# Patient Record
Sex: Female | Born: 1955 | Race: White | Hispanic: No | Smoking: Never smoker
Health system: Southern US, Community
[De-identification: ages and names within clinical notes are randomized; demographics above are authoritative.]

## PROBLEM LIST (undated history)

## (undated) ENCOUNTER — Emergency Department (HOSPITAL_COMMUNITY): Payer: BC Managed Care – PPO

## (undated) DIAGNOSIS — M4310 Spondylolisthesis, site unspecified: Secondary | ICD-10-CM

## (undated) DIAGNOSIS — R945 Abnormal results of liver function studies: Secondary | ICD-10-CM

## (undated) DIAGNOSIS — M1990 Unspecified osteoarthritis, unspecified site: Secondary | ICD-10-CM

## (undated) DIAGNOSIS — F419 Anxiety disorder, unspecified: Secondary | ICD-10-CM

## (undated) DIAGNOSIS — R739 Hyperglycemia, unspecified: Secondary | ICD-10-CM

## (undated) DIAGNOSIS — M4306 Spondylolysis, lumbar region: Secondary | ICD-10-CM

## (undated) DIAGNOSIS — R7989 Other specified abnormal findings of blood chemistry: Secondary | ICD-10-CM

## (undated) DIAGNOSIS — I10 Essential (primary) hypertension: Secondary | ICD-10-CM

## (undated) DIAGNOSIS — H269 Unspecified cataract: Secondary | ICD-10-CM

## (undated) DIAGNOSIS — F32A Depression, unspecified: Secondary | ICD-10-CM

## (undated) DIAGNOSIS — K602 Anal fissure, unspecified: Secondary | ICD-10-CM

## (undated) DIAGNOSIS — F329 Major depressive disorder, single episode, unspecified: Secondary | ICD-10-CM

## (undated) DIAGNOSIS — E785 Hyperlipidemia, unspecified: Secondary | ICD-10-CM

## (undated) DIAGNOSIS — T7840XA Allergy, unspecified, initial encounter: Secondary | ICD-10-CM

## (undated) DIAGNOSIS — R569 Unspecified convulsions: Secondary | ICD-10-CM

## (undated) DIAGNOSIS — C712 Malignant neoplasm of temporal lobe: Secondary | ICD-10-CM

## (undated) HISTORY — DX: Anal fissure, unspecified: K60.2

## (undated) HISTORY — DX: Hyperglycemia, unspecified: R73.9

## (undated) HISTORY — DX: Allergy, unspecified, initial encounter: T78.40XA

## (undated) HISTORY — DX: Spondylolysis, lumbar region: M43.06

## (undated) HISTORY — DX: Unspecified osteoarthritis, unspecified site: M19.90

## (undated) HISTORY — DX: Depression, unspecified: F32.A

## (undated) HISTORY — DX: Major depressive disorder, single episode, unspecified: F32.9

## (undated) HISTORY — PX: HEEL SPUR SURGERY: SHX665

## (undated) HISTORY — DX: Essential (primary) hypertension: I10

## (undated) HISTORY — DX: Other specified abnormal findings of blood chemistry: R79.89

## (undated) HISTORY — DX: Anxiety disorder, unspecified: F41.9

## (undated) HISTORY — DX: Unspecified cataract: H26.9

## (undated) HISTORY — DX: Hyperlipidemia, unspecified: E78.5

## (undated) HISTORY — DX: Spondylolisthesis, site unspecified: M43.10

## (undated) HISTORY — PX: OTHER SURGICAL HISTORY: SHX169

## (undated) HISTORY — DX: Abnormal results of liver function studies: R94.5

---

## 1997-05-25 ENCOUNTER — Other Ambulatory Visit: Admission: RE | Admit: 1997-05-25 | Discharge: 1997-05-25 | Payer: Self-pay | Admitting: Obstetrics and Gynecology

## 1998-08-28 ENCOUNTER — Other Ambulatory Visit: Admission: RE | Admit: 1998-08-28 | Discharge: 1998-08-28 | Payer: Self-pay | Admitting: Obstetrics and Gynecology

## 1999-08-06 ENCOUNTER — Encounter: Payer: Self-pay | Admitting: Obstetrics and Gynecology

## 1999-08-06 ENCOUNTER — Encounter: Admission: RE | Admit: 1999-08-06 | Discharge: 1999-08-06 | Payer: Self-pay | Admitting: Obstetrics and Gynecology

## 1999-12-30 ENCOUNTER — Other Ambulatory Visit: Admission: RE | Admit: 1999-12-30 | Discharge: 1999-12-30 | Payer: Self-pay | Admitting: Obstetrics and Gynecology

## 2000-09-14 ENCOUNTER — Encounter: Admission: RE | Admit: 2000-09-14 | Discharge: 2000-09-14 | Payer: Self-pay | Admitting: Obstetrics and Gynecology

## 2000-09-14 ENCOUNTER — Encounter: Payer: Self-pay | Admitting: Obstetrics and Gynecology

## 2001-02-03 ENCOUNTER — Other Ambulatory Visit: Admission: RE | Admit: 2001-02-03 | Discharge: 2001-02-03 | Payer: Self-pay | Admitting: Obstetrics and Gynecology

## 2001-07-22 ENCOUNTER — Encounter: Admission: RE | Admit: 2001-07-22 | Discharge: 2001-07-22 | Payer: Self-pay | Admitting: Obstetrics and Gynecology

## 2001-07-22 ENCOUNTER — Encounter: Payer: Self-pay | Admitting: Obstetrics and Gynecology

## 2002-03-02 ENCOUNTER — Other Ambulatory Visit: Admission: RE | Admit: 2002-03-02 | Discharge: 2002-03-02 | Payer: Self-pay | Admitting: Obstetrics and Gynecology

## 2002-05-03 ENCOUNTER — Encounter: Admission: RE | Admit: 2002-05-03 | Discharge: 2002-05-03 | Payer: Self-pay | Admitting: Urology

## 2002-05-03 ENCOUNTER — Encounter: Payer: Self-pay | Admitting: Urology

## 2002-08-17 ENCOUNTER — Encounter: Admission: RE | Admit: 2002-08-17 | Discharge: 2002-08-17 | Payer: Self-pay | Admitting: Obstetrics and Gynecology

## 2002-08-17 ENCOUNTER — Encounter: Payer: Self-pay | Admitting: Obstetrics and Gynecology

## 2002-11-13 ENCOUNTER — Encounter: Admission: RE | Admit: 2002-11-13 | Discharge: 2002-11-13 | Payer: Self-pay | Admitting: Diagnostic Radiology

## 2003-03-28 ENCOUNTER — Other Ambulatory Visit: Admission: RE | Admit: 2003-03-28 | Discharge: 2003-03-28 | Payer: Self-pay | Admitting: Obstetrics and Gynecology

## 2004-05-01 ENCOUNTER — Other Ambulatory Visit: Admission: RE | Admit: 2004-05-01 | Discharge: 2004-05-01 | Payer: Self-pay | Admitting: Obstetrics and Gynecology

## 2004-07-28 ENCOUNTER — Encounter: Admission: RE | Admit: 2004-07-28 | Discharge: 2004-07-28 | Payer: Self-pay | Admitting: Obstetrics and Gynecology

## 2004-07-30 ENCOUNTER — Encounter: Admission: RE | Admit: 2004-07-30 | Discharge: 2004-07-30 | Payer: Self-pay | Admitting: Obstetrics and Gynecology

## 2004-12-01 ENCOUNTER — Encounter: Admission: RE | Admit: 2004-12-01 | Discharge: 2004-12-01 | Payer: Self-pay | Admitting: Obstetrics and Gynecology

## 2005-01-13 ENCOUNTER — Encounter (INDEPENDENT_AMBULATORY_CARE_PROVIDER_SITE_OTHER): Payer: Self-pay | Admitting: *Deleted

## 2005-01-13 ENCOUNTER — Ambulatory Visit (HOSPITAL_COMMUNITY): Admission: RE | Admit: 2005-01-13 | Discharge: 2005-01-13 | Payer: Self-pay | Admitting: Gastroenterology

## 2005-07-03 ENCOUNTER — Encounter: Admission: RE | Admit: 2005-07-03 | Discharge: 2005-07-03 | Payer: Self-pay | Admitting: *Deleted

## 2005-08-27 ENCOUNTER — Encounter: Admission: RE | Admit: 2005-08-27 | Discharge: 2005-08-27 | Payer: Self-pay | Admitting: Obstetrics and Gynecology

## 2006-09-06 ENCOUNTER — Encounter: Admission: RE | Admit: 2006-09-06 | Discharge: 2006-09-06 | Payer: Self-pay | Admitting: Obstetrics and Gynecology

## 2007-09-07 ENCOUNTER — Encounter: Admission: RE | Admit: 2007-09-07 | Discharge: 2007-09-07 | Payer: Self-pay | Admitting: Obstetrics and Gynecology

## 2008-09-11 ENCOUNTER — Encounter: Admission: RE | Admit: 2008-09-11 | Discharge: 2008-09-11 | Payer: Self-pay | Admitting: Family Medicine

## 2008-09-12 ENCOUNTER — Encounter: Admission: RE | Admit: 2008-09-12 | Discharge: 2008-09-12 | Payer: Self-pay | Admitting: Family Medicine

## 2008-09-25 ENCOUNTER — Encounter: Admission: RE | Admit: 2008-09-25 | Discharge: 2008-09-25 | Payer: Self-pay | Admitting: Obstetrics and Gynecology

## 2009-10-15 ENCOUNTER — Encounter: Admission: RE | Admit: 2009-10-15 | Discharge: 2009-10-15 | Payer: Self-pay | Admitting: Obstetrics and Gynecology

## 2010-03-10 ENCOUNTER — Encounter: Payer: Self-pay | Admitting: Obstetrics and Gynecology

## 2010-07-04 NOTE — Op Note (Signed)
Lindsey Daniels, Lindsey Daniels                  ACCOUNT NO.:  [id number]   MEDICAL RECORD NO.:  [id number]          PATIENT TYPE:  AMB   LOCATION:  ENDO                         FACILITY:  [facility]   PHYSICIAN:  Petra Kuba, M.D.    DATE OF BIRTH:  10-11-1955   DATE OF PROCEDURE:  01/10/2005  DATE OF DISCHARGE:                                 OPERATIVE REPORT   PROCEDURE:  Colonoscopy with hot biopsy.   INDICATION:  Abnormal VC for questionable polyp at the rectosigmoid  junction.  Consent was signed after risks, benefits, methods, options  thoroughly discussed with [name] both in DRI and prior to the procedure.   MEDICINES USED:  1.  Fentanyl 75 mcg.  2.  Versed 7.5 mg.   DESCRIPTION OF PROCEDURE:  Rectal inspection was pertinent for external  hemorrhoids, small.  Digital exam was negative.  The video pediatric  adjustable colonoscope was inserted, and we did evaluate the rectum and the  rectosigmoid junction at this junction, and no obvious polyp was seen.  We  elected to advance to the cecum which did require some abdominal pressure  but no position changes.  No other abnormalities were seen on insertion.  The cecum was identified by the appendiceal orifice and the ileocecal valve.  The prep was adequate.  There was some liquid stool that required washing  and suctioning.  On slow withdrawal through the colon, no polyps were seen  as we slowly withdrew back to the rectum.  Again, the rectum and distal  sigmoid were thoroughly evaluated and, again, no polyps were seen.  Anorectal pull-through and retroflexion revealed some small hemorrhoids.  The scope was straightened and readvanced to the rectosigmoid junction, and  there seemed to be a little bump at the mucosa at this juncture, although it  did seem to flatten out with more air insufflation.  We went ahead and hot  biopsied the area.  Also, right behind a fold was another little bump, and  this was hot biopsied as well.  I don't  believe either were real polyps.  No  other abnormalities were seen as we thoroughly reevaluated the area one more  time.  Air was suctioned and scope removed.  The patient tolerated the  procedure well.  There was no obvious immediate complication.   ENDOSCOPIC DIAGNOSES:  1.  Internal/external small hemorrhoids.  2.  Two questionable polyps at the rectosigmoid junction, hot biopsied.  3.  Otherwise, within normal limits to the cecum.   PLAN:  Await pathology.  If either of these are adenomatous, repeat  colonoscopy at the appropriate time.  However, if neither are polyps, might  consider a repeat VC in 3 years just to reevaluate if the patient is willing  or follow up colonoscopy in 5.  Happy to discuss that further with the  patient or Dr. Molli Posey and see back p.r.n.  Otherwise, return care to Drs.  Sharlyne Cai for the customary health care screening and maintenance.           ______________________________  Petra Kuba, M.D.  MEM/MEDQ  D:  01/13/2005  T:  01/13/2005  Job:  [id number]   cc:   Miguel Aschoff, M.D.  Fax: [phone]   Stan Head. Cleta Alberts, M.D.  Fax: [phone]   Lorenda Ishihara. Molli Posey, M.D.  Fax: [phone]

## 2010-10-31 ENCOUNTER — Other Ambulatory Visit: Payer: Self-pay | Admitting: Obstetrics and Gynecology

## 2010-10-31 DIAGNOSIS — Z1231 Encounter for screening mammogram for malignant neoplasm of breast: Secondary | ICD-10-CM

## 2010-11-04 ENCOUNTER — Ambulatory Visit: Payer: Self-pay

## 2010-11-11 ENCOUNTER — Ambulatory Visit
Admission: RE | Admit: 2010-11-11 | Discharge: 2010-11-11 | Disposition: A | Discharge status: Home / Self Care | Payer: Self-pay | Source: Ambulatory Visit | Attending: Obstetrics and Gynecology | Admitting: Obstetrics and Gynecology

## 2010-11-11 DIAGNOSIS — Z1231 Encounter for screening mammogram for malignant neoplasm of breast: Secondary | ICD-10-CM

## 2010-12-18 ENCOUNTER — Other Ambulatory Visit: Payer: Self-pay | Admitting: Obstetrics and Gynecology

## 2011-06-20 ENCOUNTER — Ambulatory Visit (INDEPENDENT_AMBULATORY_CARE_PROVIDER_SITE_OTHER): Payer: PRIVATE HEALTH INSURANCE | Admitting: Physician Assistant

## 2011-06-20 ENCOUNTER — Encounter: Payer: Self-pay | Admitting: Physician Assistant

## 2011-06-20 VITALS — BP 124/77 | HR 79 | Temp 98.3°F | Resp 16

## 2011-06-20 DIAGNOSIS — Z111 Encounter for screening for respiratory tuberculosis: Secondary | ICD-10-CM

## 2011-06-20 NOTE — Progress Notes (Signed)
Presents for screening TB skin test.  Asymptomatic.  No history of positive test.  No history of TB.  She is an Engineer, structural.  PPD placed.  RTC 48-72 hours for reading.

## 2011-06-22 ENCOUNTER — Encounter (INDEPENDENT_AMBULATORY_CARE_PROVIDER_SITE_OTHER): Payer: PRIVATE HEALTH INSURANCE

## 2011-06-22 DIAGNOSIS — Z111 Encounter for screening for respiratory tuberculosis: Secondary | ICD-10-CM

## 2011-07-11 ENCOUNTER — Other Ambulatory Visit: Payer: Self-pay | Admitting: Physician Assistant

## 2011-07-11 NOTE — Telephone Encounter (Signed)
Need chart

## 2011-07-17 LAB — TB SKIN TEST: TB Skin Test: NEGATIVE

## 2011-07-28 ENCOUNTER — Other Ambulatory Visit: Payer: Self-pay | Admitting: Physician Assistant

## 2011-08-22 ENCOUNTER — Other Ambulatory Visit: Payer: Self-pay | Admitting: Physician Assistant

## 2011-09-03 ENCOUNTER — Other Ambulatory Visit: Payer: Self-pay | Admitting: Physician Assistant

## 2011-11-30 ENCOUNTER — Other Ambulatory Visit: Payer: Self-pay | Admitting: Obstetrics and Gynecology

## 2011-11-30 DIAGNOSIS — Z1231 Encounter for screening mammogram for malignant neoplasm of breast: Secondary | ICD-10-CM

## 2011-12-18 ENCOUNTER — Other Ambulatory Visit: Payer: Self-pay | Admitting: Physician Assistant

## 2011-12-18 NOTE — Telephone Encounter (Signed)
Patient informed she needed office visit/labs in June - 2nd notice

## 2011-12-24 ENCOUNTER — Encounter: Payer: Self-pay | Admitting: Physician Assistant

## 2011-12-24 ENCOUNTER — Other Ambulatory Visit: Payer: Self-pay | Admitting: Obstetrics and Gynecology

## 2011-12-24 ENCOUNTER — Ambulatory Visit
Admission: RE | Admit: 2011-12-24 | Discharge: 2011-12-24 | Disposition: A | Discharge status: Home / Self Care | Payer: BC Managed Care – PPO | Source: Ambulatory Visit | Attending: Obstetrics and Gynecology | Admitting: Obstetrics and Gynecology

## 2011-12-24 ENCOUNTER — Ambulatory Visit (INDEPENDENT_AMBULATORY_CARE_PROVIDER_SITE_OTHER): Payer: BC Managed Care – PPO | Admitting: Physician Assistant

## 2011-12-24 VITALS — BP 138/60 | HR 78 | Temp 98.4°F | Resp 16 | Ht 61.5 in | Wt 156.2 lb

## 2011-12-24 DIAGNOSIS — F4320 Adjustment disorder, unspecified: Secondary | ICD-10-CM

## 2011-12-24 DIAGNOSIS — E785 Hyperlipidemia, unspecified: Secondary | ICD-10-CM

## 2011-12-24 DIAGNOSIS — F430 Acute stress reaction: Secondary | ICD-10-CM

## 2011-12-24 DIAGNOSIS — Z1231 Encounter for screening mammogram for malignant neoplasm of breast: Secondary | ICD-10-CM

## 2011-12-24 MED ORDER — SERTRALINE HCL 100 MG PO TABS: 100.0000 mg | ORAL_TABLET | Freq: Every day | ORAL | Status: DC

## 2011-12-24 MED ORDER — SIMVASTATIN 20 MG PO TABS: 20.0000 mg | ORAL_TABLET | Freq: Every day | ORAL | Status: DC

## 2011-12-24 NOTE — Patient Instructions (Signed)
Keep up the great work with healthy living!

## 2011-12-24 NOTE — Progress Notes (Signed)
Subjective:    Patient ID: Lindsey Daniels, female    DOB: [birth year], 56 y.o.   MRN: [id number]  HPI This 56 y.o. female presents for evaluation of hyperlipidemia and situational stress.  She is seeing [facility] for counseling, which is going very well.  Has completely changed her eating habits, and while she hasn't lost any weight, she feels much better. Sleeping much better.  Feels in control of herself.  Getting things sorted regarding her split from her husband.  He hasn't picked up his things yet, but it's all packed and in the garage.  She owns the truck he drives, so her next step is to take possession of it.   Review of Systems  No chest pain, SOB, HA, dizziness, vision change, N/V, diarrhea, constipation, dysuria, urinary urgency or frequency, myalgias, arthralgias or rash.   Past Medical History  Diagnosis Date  . Hyperlipidemia   . Depression   . Anxiety   . Elevated LFTs   . Hyperglycemia   . Anal fissure   . Spondylolisthesis     lumbar  . Pars defect of lumbar spine     Past Surgical History  Procedure Date  . Heel spur surgery     Prior to Admission medications   Medication Sig Start Date End Date Taking? Authorizing Provider  Multiple Vitamin (MULTIVITAMIN) tablet Take 1 tablet by mouth daily.   Yes Historical Provider, MD  sertraline (ZOLOFT) 100 MG tablet TAKE ONE TABLET BY MOUTH ONE TIME DAILY 09/03/11  Yes Ryan M Dunn, PA-C  simvastatin (ZOCOR) 20 MG tablet TAKE ONE TABLET BY MOUTH NIGHTLY AT BEDTIME 12/18/11  Yes Eleanore E Debbra Riding, PA-C    Allergies  Allergen Reactions  . Amoxicillin Rash    History   Social History  . Marital Status: Married    Spouse Name: N/A    Number of Children: 0  . Years of Education: N/A   Occupational History  . 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 Smoking status: Never Smoker   . Smokeless tobacco: Never Used  . Alcohol Use: 0.0 - 2.0 oz/week    0-4 drink(s) per  week  . Drug Use: No  . Sexually Active: Not Currently -- Female partner(s)   Other Topics Concern  . Not on file   Social History Narrative   Separated from second husband.  Very contentious split.    Family History  Problem Relation Age of Onset  . Mental illness Father     depression  . COPD Father   . Alcohol abuse Brother        Objective:   Physical Exam  Blood pressure 138/60, pulse 78, temperature 98.4 F (36.9 C), temperature source Oral, resp. rate 16, height 5' 1.5" (1.562 m), weight 156 lb 3.2 oz (70.852 kg), SpO2 97.00%. Body mass index is 29.04 kg/(m^2). Well-developed, well nourished WF who is awake, alert and oriented, in NAD. HEENT: [place]/AT, sclera and conjunctiva are clear.   Neck: supple, non-tender, no lymphadenopathy, thyromegaly. Heart: RRR, no murmur Lungs: normal effort, CTA Extremities: no cyanosis, clubbing or edema. Skin: warm and dry without rash. Psychologic: good mood and appropriate affect, normal speech and behavior.     Assessment & Plan:   1. Hyperlipidemia  Lipid panel, Comprehensive metabolic panel, simvastatin (ZOCOR) 20 MG tablet  2. Adult situational stress disorder  sertraline (ZOLOFT) 100 MG tablet   She is not fasting, so will return  for labs 12/26/2011.

## 2011-12-27 ENCOUNTER — Other Ambulatory Visit (INDEPENDENT_AMBULATORY_CARE_PROVIDER_SITE_OTHER): Payer: BC Managed Care – PPO | Admitting: *Deleted

## 2011-12-27 ENCOUNTER — Ambulatory Visit: Payer: BC Managed Care – PPO

## 2011-12-27 DIAGNOSIS — E785 Hyperlipidemia, unspecified: Secondary | ICD-10-CM

## 2011-12-27 DIAGNOSIS — E782 Mixed hyperlipidemia: Secondary | ICD-10-CM

## 2011-12-27 LAB — LIPID PANEL
Cholesterol: 134 mg/dL (ref 0–200)
HDL: 59 mg/dL
LDL Cholesterol: 57 mg/dL (ref 0–99)
Total CHOL/HDL Ratio: 2.3 ratio
Triglycerides: 91 mg/dL
VLDL: 18 mg/dL (ref 0–40)

## 2011-12-27 LAB — COMPREHENSIVE METABOLIC PANEL
ALT: 17 U/L (ref 0–35)
Albumin: 4.3 g/dL (ref 3.5–5.2)
Calcium: 9.7 mg/dL (ref 8.4–10.5)
Potassium: 3.9 mEq/L (ref 3.5–5.3)
Sodium: 140 mEq/L (ref 135–145)
Total Protein: 6.8 g/dL (ref 6.0–8.3)

## 2011-12-27 NOTE — Progress Notes (Signed)
Here for blood draw

## 2012-04-10 ENCOUNTER — Ambulatory Visit (INDEPENDENT_AMBULATORY_CARE_PROVIDER_SITE_OTHER): Payer: BC Managed Care – PPO | Admitting: Physician Assistant

## 2012-04-10 VITALS — BP 143/83 | HR 92 | Temp 98.5°F | Resp 17 | Ht 62.0 in | Wt 157.0 lb

## 2012-04-10 DIAGNOSIS — Z111 Encounter for screening for respiratory tuberculosis: Secondary | ICD-10-CM

## 2012-04-10 NOTE — Progress Notes (Signed)
Pt needs a PPD for her work.

## 2012-04-10 NOTE — Progress Notes (Signed)
  Subjective:    Patient ID: Lindsey Daniels, female    DOB: 06-Aug-1955, 57 y.o.   MRN: [id number]  HPI    Review of Systems     Objective:   Physical Exam        Assessment & Plan:  Tuberculosis Risk Questionnaire  1. Were you born outside the [place] in one of the following parts of the world:    [place], [place], [place], [place] or [place]?  no  2. Have you traveled outside the [place] and lived for more than one month in one of the following parts of the world:  [place], [place], [place], [place] or [place]?  No  3. Do you have a compromised immune system such as from any of the following conditions:  HIV/AIDS, organ or bone marrow transplantation, diabetes, immunosuppressive   medicines (e.g. Prednisone, Remicaide), leukemia, lymphoma, cancer of the   head or neck, gastrectomy or jejunal bypass, end-stage renal disease (on   dialysis), or silicosis?  No    4. Have you ever done one of the following:    Used crack cocaine, injected illegal drugs, worked or resided in jail or prison,   worked or resided at a homeless shelter, or worked as a Research scientist (physical sciences) in   direct contact with patients?  No  5. Have you ever been exposed to anyone with infectious tuberculosis?  Yes   Do you currently have any of the following symptoms?  1. Unexplained cough lasting more than 3 weeks? no  Unexplained fever lasting more than 3 weeks. no  3. Night Sweats (sweating that leaves the bedclothes and sheets wet)  No   4. Shortness of Breath no   5. Chest Pain {no  6. Unintentional weight loss  no  7. Unexplained fatigue no

## 2012-07-14 ENCOUNTER — Ambulatory Visit (INDEPENDENT_AMBULATORY_CARE_PROVIDER_SITE_OTHER): Payer: BC Managed Care – PPO | Admitting: Physician Assistant

## 2012-07-14 ENCOUNTER — Encounter: Payer: Self-pay | Admitting: Physician Assistant

## 2012-07-14 VITALS — BP 130/72 | HR 80 | Temp 98.2°F | Resp 17 | Ht 61.5 in | Wt 158.4 lb

## 2012-07-14 DIAGNOSIS — E785 Hyperlipidemia, unspecified: Secondary | ICD-10-CM

## 2012-07-14 DIAGNOSIS — G2581 Restless legs syndrome: Secondary | ICD-10-CM

## 2012-07-14 LAB — COMPREHENSIVE METABOLIC PANEL
ALT: 19 U/L (ref 0–35)
Calcium: 9.7 mg/dL (ref 8.4–10.5)
Chloride: 104 mEq/L (ref 96–112)
Potassium: 4.2 mEq/L (ref 3.5–5.3)
Sodium: 139 mEq/L (ref 135–145)
Total Protein: 6.9 g/dL (ref 6.0–8.3)

## 2012-07-14 LAB — CBC WITH DIFFERENTIAL/PLATELET
Basophils Absolute: 0 10*3/uL (ref 0.0–0.1)
Eosinophils Relative: 4 % (ref 0–5)
HCT: 39 % (ref 36.0–46.0)
Lymphocytes Relative: 33 % (ref 12–46)
Lymphs Abs: 1.7 10*3/uL (ref 0.7–4.0)
MCV: 82.6 fL (ref 78.0–100.0)
Monocytes Absolute: 0.3 10*3/uL (ref 0.1–1.0)
RDW: 14.2 % (ref 11.5–15.5)
WBC: 5.2 10*3/uL (ref 4.0–10.5)

## 2012-07-14 LAB — LIPID PANEL
Cholesterol: 244 mg/dL — ABNORMAL HIGH (ref 0–200)
HDL: 63 mg/dL (ref >39)
Triglycerides: 144 mg/dL (ref <150)

## 2012-07-14 NOTE — Progress Notes (Signed)
  Subjective:    Patient ID: Lindsey Daniels, female    DOB: Feb 22, 1955, 57 y.o.   MRN: [id number]  HPI  This 57 y.o. female presents for evaluation of hyperlipidemia. She's off the simvastatin and needs to see how her levels are.  Her stress has dramatically improved, and she's doing well with continued counseling and sertraline 100 mg QD.  No adverse effects.  Notes increased leg pain, and sensation of needing to move her legs more, especially at night.  Sometimes wakes her from sleep.  Symptoms have started since she's been working more and is standing longer hours.  She's recently purchased more supportive shoes, and some shoe inserts, but hasn't worn them long enough to know if they'll help.   Review of Systems No chest pain, SOB, HA, dizziness, vision change, N/V, diarrhea, constipation, dysuria, urinary urgency or frequency, or rash.     Objective:   Physical Exam Blood pressure 130/72, pulse 80, temperature 98.2 F (36.8 C), temperature source Oral, resp. rate 17, height 5' 1.5" (1.562 m), weight 158 lb 6.4 oz (71.85 kg). Body mass index is 29.45 kg/(m^2). Well-developed, well nourished WF who is awake, alert and oriented, in NAD. HEENT: [place]/AT, sclera and conjunctiva are clear.   Neck: supple, non-tender, no lymphadenopathy, thyromegaly. Heart: RRR, no murmur Lungs: normal effort, CTA Skin: warm and dry without rash. Psychologic: good mood and appropriate affect, normal speech and behavior.      Assessment & Plan:  Hyperlipidemia - Plan: Comprehensive metabolic panel, Lipid panel  Restless legs - Plan: CBC with Differential, Vitamin B12  Patient Instructions  Keep up the great work. Drink at least 64 ounces of water daily.  Wear supportive shoes. Consider support stockings.  I will contact you with your lab results as soon as they are available.   If you have not heard from me in 2 weeks, please contact me.  The fastest way to get your results is to register for My Chart  (see the instructions on the last page of this printout).     Fernande Bras, PA-C Physician Assistant-Certified [facility]

## 2012-07-14 NOTE — Patient Instructions (Addendum)
Keep up the great work. Drink at least 64 ounces of water daily.  Wear supportive shoes. Consider support stockings.  I will contact you with your lab results as soon as they are available.   If you have not heard from me in 2 weeks, please contact me.  The fastest way to get your results is to register for My Chart (see the instructions on the last page of this printout).

## 2012-07-17 ENCOUNTER — Encounter: Payer: Self-pay | Admitting: Physician Assistant

## 2012-07-20 ENCOUNTER — Encounter: Payer: Self-pay | Admitting: Internal Medicine

## 2012-08-25 DIAGNOSIS — M722 Plantar fascial fibromatosis: Secondary | ICD-10-CM | POA: Insufficient documentation

## 2012-09-07 ENCOUNTER — Encounter: Payer: Self-pay | Admitting: Physician Assistant

## 2012-09-07 DIAGNOSIS — M79604 Pain in right leg: Secondary | ICD-10-CM | POA: Insufficient documentation

## 2012-09-07 DIAGNOSIS — M79671 Pain in right foot: Secondary | ICD-10-CM

## 2012-09-07 DIAGNOSIS — M79672 Pain in left foot: Secondary | ICD-10-CM

## 2012-09-07 DIAGNOSIS — M79605 Pain in left leg: Secondary | ICD-10-CM | POA: Insufficient documentation

## 2012-11-10 ENCOUNTER — Encounter: Payer: Self-pay | Admitting: Podiatry

## 2012-11-10 DIAGNOSIS — M722 Plantar fascial fibromatosis: Secondary | ICD-10-CM

## 2012-11-21 ENCOUNTER — Encounter: Payer: Self-pay | Admitting: Podiatry

## 2012-11-21 ENCOUNTER — Ambulatory Visit (INDEPENDENT_AMBULATORY_CARE_PROVIDER_SITE_OTHER): Payer: BC Managed Care – PPO | Admitting: Podiatry

## 2012-11-21 VITALS — BP 134/81 | HR 71 | Resp 20

## 2012-11-21 DIAGNOSIS — M722 Plantar fascial fibromatosis: Secondary | ICD-10-CM

## 2012-11-21 MED ORDER — TRIAMCINOLONE ACETONIDE 10 MG/ML IJ SUSP: 5.0000 mg | Freq: Once | INTRAMUSCULAR | Status: DC

## 2012-11-21 NOTE — Progress Notes (Signed)
Subjective:     Patient ID: Lindsey Daniels, female   DOB: 06-Feb-1956, 57 y.o.   MRN: [id number]  HPI patient states that the pain is improving. Patient is satisfied with how she is doing but states pain is still present during the day and especially in the morning.   Review of Systems  All other systems reviewed and are negative.       Objective:   Physical Exam  Cardiovascular: Intact distal pulses.    neurological found to be intact. Patient is well oriented x3.     Assessment:    patient is improving. Pain still present medial cup tenial tubercle.     Plan:     Advised patient on continued night splint usage. Continue Mobic for one more month and then taper. Continue physical therapy. Prepped the right heel and injected with 3 mg Kenalog 10 mg of Xylocaine and advised on reduced activity today.

## 2012-11-21 NOTE — Patient Instructions (Signed)
Continue mobic for 1 month and then try to reduce. 1-2 ibuprophen in morning and continue splint in the evening

## 2012-11-30 ENCOUNTER — Other Ambulatory Visit: Payer: Self-pay | Admitting: Podiatry

## 2012-12-01 ENCOUNTER — Telehealth: Payer: Self-pay | Admitting: *Deleted

## 2012-12-01 NOTE — Telephone Encounter (Signed)
Request Meloxicam refill.  Left message that pt needed an appt prior to refill as instructed on LOV 10/06/2012.

## 2012-12-12 ENCOUNTER — Other Ambulatory Visit: Payer: Self-pay | Admitting: Physician Assistant

## 2012-12-29 ENCOUNTER — Other Ambulatory Visit: Payer: Self-pay | Admitting: Obstetrics and Gynecology

## 2013-01-19 ENCOUNTER — Encounter: Payer: Self-pay | Admitting: Podiatry

## 2013-01-19 ENCOUNTER — Ambulatory Visit (INDEPENDENT_AMBULATORY_CARE_PROVIDER_SITE_OTHER): Payer: BC Managed Care – PPO | Admitting: Podiatry

## 2013-01-19 VITALS — BP 146/74 | HR 75 | Resp 16

## 2013-01-19 DIAGNOSIS — M722 Plantar fascial fibromatosis: Secondary | ICD-10-CM

## 2013-01-19 NOTE — Progress Notes (Signed)
Subjective:     Patient ID: Lindsey Daniels, female   DOB: [birth year], 57 y.o.   MRN: [id number]  HPI patient states that my foot is doing much better and I  Love the orthotics   Review of Systems     Objective:   Physical Exam Neurovascular status intact with significant diminishment of pain right plantar fascia and orthotics that are well worn at the current time    Assessment:     Plantar fasciitis right improving with diminished fat pad noted    Plan:     I've recommended new orthotics and patient will have a new. Made so that she can rotate them and at this time continue night splint and reappoint as needed

## 2013-02-16 DIAGNOSIS — Z86011 Personal history of benign neoplasm of the brain: Secondary | ICD-10-CM | POA: Insufficient documentation

## 2013-02-23 ENCOUNTER — Encounter: Payer: Self-pay | Admitting: Podiatry

## 2013-03-02 ENCOUNTER — Encounter: Payer: BC Managed Care – PPO | Admitting: Physician Assistant

## 2013-03-16 ENCOUNTER — Encounter: Payer: Self-pay | Admitting: Physician Assistant

## 2013-03-16 ENCOUNTER — Ambulatory Visit (INDEPENDENT_AMBULATORY_CARE_PROVIDER_SITE_OTHER): Payer: BC Managed Care – PPO | Admitting: Physician Assistant

## 2013-03-16 VITALS — BP 130/78 | HR 76 | Temp 97.7°F | Resp 16 | Ht 62.0 in | Wt 163.0 lb

## 2013-03-16 DIAGNOSIS — F4320 Adjustment disorder, unspecified: Secondary | ICD-10-CM

## 2013-03-16 DIAGNOSIS — H9190 Unspecified hearing loss, unspecified ear: Secondary | ICD-10-CM

## 2013-03-16 DIAGNOSIS — J309 Allergic rhinitis, unspecified: Secondary | ICD-10-CM

## 2013-03-16 DIAGNOSIS — F430 Acute stress reaction: Secondary | ICD-10-CM

## 2013-03-16 DIAGNOSIS — Z111 Encounter for screening for respiratory tuberculosis: Secondary | ICD-10-CM

## 2013-03-16 DIAGNOSIS — Z1159 Encounter for screening for other viral diseases: Secondary | ICD-10-CM

## 2013-03-16 DIAGNOSIS — E785 Hyperlipidemia, unspecified: Secondary | ICD-10-CM

## 2013-03-16 DIAGNOSIS — Z0000 Encounter for general adult medical examination without abnormal findings: Secondary | ICD-10-CM

## 2013-03-16 LAB — CBC WITH DIFFERENTIAL/PLATELET
BASOS ABS: 0 10*3/uL (ref 0.0–0.1)
BASOS PCT: 0 % (ref 0–1)
Eosinophils Absolute: 0.3 10*3/uL (ref 0.0–0.7)
Eosinophils Relative: 5 % (ref 0–5)
HEMATOCRIT: 39.5 % (ref 36.0–46.0)
Hemoglobin: 13.6 g/dL (ref 12.0–15.0)
LYMPHS PCT: 28 % (ref 12–46)
Lymphs Abs: 1.7 10*3/uL (ref 0.7–4.0)
MCH: 29.1 pg (ref 26.0–34.0)
MCHC: 34.4 g/dL (ref 30.0–36.0)
MCV: 84.6 fL (ref 78.0–100.0)
Monocytes Absolute: 0.5 10*3/uL (ref 0.1–1.0)
Monocytes Relative: 8 % (ref 3–12)
NEUTROS ABS: 3.4 10*3/uL (ref 1.7–7.7)
NEUTROS PCT: 59 % (ref 43–77)
PLATELETS: 284 10*3/uL (ref 150–400)
RBC: 4.67 MIL/uL (ref 3.87–5.11)
RDW: 13.9 % (ref 11.5–15.5)
WBC: 5.9 10*3/uL (ref 4.0–10.5)

## 2013-03-16 LAB — LIPID PANEL
CHOL/HDL RATIO: 4.1 ratio
Cholesterol: 235 mg/dL — ABNORMAL HIGH (ref 0–200)
HDL: 57 mg/dL (ref >39)
LDL Cholesterol: 140 mg/dL — ABNORMAL HIGH (ref 0–99)
Triglycerides: 190 mg/dL — ABNORMAL HIGH (ref <150)
VLDL: 38 mg/dL (ref 0–40)

## 2013-03-16 LAB — COMPREHENSIVE METABOLIC PANEL
ALK PHOS: 93 U/L (ref 39–117)
ALT: 21 U/L (ref 0–35)
AST: 25 U/L (ref 0–37)
Albumin: 4.5 g/dL (ref 3.5–5.2)
BILIRUBIN TOTAL: 0.7 mg/dL (ref 0.2–1.2)
BUN: 12 mg/dL (ref 6–23)
CHLORIDE: 103 meq/L (ref 96–112)
CO2: 30 mEq/L (ref 19–32)
CREATININE: 0.67 mg/dL (ref 0.50–1.10)
Calcium: 9.7 mg/dL (ref 8.4–10.5)
Glucose, Bld: 104 mg/dL — ABNORMAL HIGH (ref 70–99)
Potassium: 4.1 mEq/L (ref 3.5–5.3)
Sodium: 139 mEq/L (ref 135–145)
Total Protein: 7.2 g/dL (ref 6.0–8.3)

## 2013-03-16 MED ORDER — SERTRALINE HCL 100 MG PO TABS: 100.0000 mg | ORAL_TABLET | Freq: Every day | ORAL | Status: DC

## 2013-03-16 MED ORDER — FLUTICASONE PROPIONATE 50 MCG/ACT NA SUSP: 2.0000 | Freq: Every day | NASAL | Status: DC

## 2013-03-16 MED ORDER — SIMVASTATIN 20 MG PO TABS: 20.0000 mg | ORAL_TABLET | Freq: Every day | ORAL | Status: DC

## 2013-03-16 NOTE — Progress Notes (Signed)
Subjective:    Patient ID: Lindsey Daniels, female    DOB: May 08, 1955, 58 y.o.   MRN: [id number]  PCP: Hermine Feria, PA-C  Chief Complaint  Patient presents with  . Annual Exam    just recently had eye exam  . PPD Placement  . Hearing Problem      Active Ambulatory Problems    Diagnosis Date Noted  . Hyperlipidemia 12/24/2011  . Bilateral leg pain 09/07/2012  . Foot pain, bilateral 09/07/2012  . Plantar fasciitis BILATERAL 08/25/2012   Resolved Ambulatory Problems    Diagnosis Date Noted  . No Resolved Ambulatory Problems   Past Medical History  Diagnosis Date  . Depression   . Anxiety   . Elevated LFTs   . Hyperglycemia   . Anal fissure   . Spondylolisthesis   . Pars defect of lumbar spine   . Osteoarthritis   . Cataract   . Allergy   . Hypertension     Past Surgical History  Procedure Laterality Date  . Heel spur surgery    . Cataract      Allergies  Allergen Reactions  . Amoxicillin Rash    Prior to Admission medications   Medication Sig Start Date End Date Taking? Authorizing Provider  sertraline (ZOLOFT) 100 MG tablet Take 1 tablet (100 mg total) by mouth daily. PATIENT NEEDS OFFICE VISIT FOR ADDITIONAL REFILLS 12/12/12  Yes Brielyn Bosak S Phuong Hillary, PA-C  ibuprofen (ADVIL,MOTRIN) 200 MG tablet Take 200 mg by mouth every 6 (six) hours as needed.    Historical Provider, MD  meloxicam (MOBIC) 15 MG tablet TAKE ONE TABLET BY MOUTH ONE TIME DAILY  11/30/12   Kendell Bane, DPM  Multiple Vitamin (MULTIVITAMIN) tablet Take 1 tablet by mouth daily.    Historical Provider, MD  simvastatin (ZOCOR) 20 MG tablet Take 1 tablet (20 mg total) by mouth at bedtime. 12/24/11   Fara Chute, PA-C    History   Social History  . Marital Status: Married    Spouse Name: separated-no papers file    Number of Children: 0  . 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 Smoking  status: Never Smoker   . Smokeless tobacco: Never Used  . Alcohol Use: 0 - 2 oz/week    0-4 drink(s) per week     Comment: social  . Drug Use: No  . Sexual Activity: Not Currently    Partners: Male   Other Topics Concern  . None   Social History Narrative   Separated from second husband.  Very contentious split.    family history includes Alcohol abuse in her brother; COPD in her father; Mental illness in her father. reported the following about her mother: ?cardiac. She indicated that her father is alive. She indicated that only one of her two brothers is alive.   HPI  Presents for Annual Wellness Exam.  Breast and pap done at GYN.  Eye exam is current.  Review of Systems  Constitutional: Negative.   HENT: Positive for congestion, drooling, hearing loss, postnasal drip, rhinorrhea and sneezing. Negative for dental problem, ear discharge, ear pain, facial swelling, mouth sores, nosebleeds, sinus pressure, sore throat, tinnitus, trouble swallowing and voice change.   Eyes: Positive for itching. Negative for photophobia, pain, discharge, redness and visual disturbance.  Respiratory: Positive for cough (due to post nasal draiange). Negative for apnea, choking, chest tightness,  shortness of breath, wheezing and stridor.   Cardiovascular: Negative.   Gastrointestinal: Negative.   Endocrine: Negative.   Genitourinary: Negative.   Musculoskeletal: Positive for arthralgias, back pain, myalgias, neck pain and neck stiffness. Negative for gait problem and joint swelling.  Skin: Negative.   Allergic/Immunologic: Positive for environmental allergies. Negative for food allergies and immunocompromised state.  Neurological: Negative.   Hematological: Negative.   Psychiatric/Behavioral: Positive for sleep disturbance. Negative for suicidal ideas, hallucinations, behavioral problems, confusion, self-injury, dysphoric mood, decreased concentration and agitation. The patient is nervous/anxious  (continues with regular therapy visits with Arvil Chaco). The patient is not hyperactive.        Objective:   Physical Exam  Vitals reviewed. Constitutional: She is oriented to person, place, and time. Vital signs are normal. She appears well-developed and well-nourished. She is active and cooperative. No distress.  HENT:  Head: Normocephalic and atraumatic.  Right Ear: Hearing, tympanic membrane, external ear and ear canal normal. No foreign bodies.  Left Ear: Hearing, tympanic membrane, external ear and ear canal normal. No foreign bodies.  Nose: Nose normal.  Mouth/Throat: Uvula is midline, oropharynx is clear and moist and mucous membranes are normal. No oral lesions. Normal dentition. No dental abscesses or uvula swelling. No oropharyngeal exudate.  Eyes: Conjunctivae, EOM and lids are normal. Pupils are equal, round, and reactive to light. Right eye exhibits no discharge. Left eye exhibits no discharge. No scleral icterus.  Fundoscopic exam:      The right eye shows no arteriolar narrowing, no AV nicking, no exudate, no hemorrhage and no papilledema. The right eye shows red reflex.       The left eye shows no arteriolar narrowing, no AV nicking, no exudate, no hemorrhage and no papilledema. The left eye shows red reflex.  Neck: Trachea normal, normal range of motion and full passive range of motion without pain. Neck supple. No spinous process tenderness and no muscular tenderness present. No mass and no thyromegaly present.  Cardiovascular: Normal rate, regular rhythm, normal heart sounds, intact distal pulses and normal pulses.   Pulmonary/Chest: Effort normal and breath sounds normal. Right breast exhibits no inverted nipple, no mass, no nipple discharge, no skin change and no tenderness. Left breast exhibits no inverted nipple, no mass, no nipple discharge, no skin change and no tenderness. Breasts are symmetrical.  Genitourinary: No breast swelling, tenderness, discharge or  bleeding.  Musculoskeletal: She exhibits no edema and no tenderness.       Cervical back: Normal.       Thoracic back: Normal.       Lumbar back: Normal.  Lymphadenopathy:       Head (right side): No tonsillar, no preauricular, no posterior auricular and no occipital adenopathy present.       Head (left side): No tonsillar, no preauricular, no posterior auricular and no occipital adenopathy present.    She has no cervical adenopathy.       Right: No supraclavicular adenopathy present.       Left: No supraclavicular adenopathy present.  Neurological: She is alert and oriented to person, place, and time. She has normal strength and normal reflexes. No cranial nerve deficit. She exhibits normal muscle tone. Coordination and gait normal.  Skin: Skin is warm, dry and intact. No rash noted. She is not diaphoretic. No cyanosis or erythema. Nails show no clubbing.  Psychiatric: She has a normal mood and affect. Her speech is normal and behavior is normal. Judgment and thought content normal.  Assessment & Plan:  1. Routine general medical examination at a health care facility Age appropriate anticipatory guidance provided. - CBC with Differential - Comprehensive metabolic panel - TSH  2. Screening examination for pulmonary tuberculosis Employer requires TB skin testing - TB Skin Test  3. Hyperlipidemia Out of simvastatin for a while.  Restart.  Healthy lifestyle changes. - Lipid panel - simvastatin (ZOCOR) 20 MG tablet; Take 1 tablet (20 mg total) by mouth at bedtime.  Dispense: 90 tablet; Refill: 3  4. Need for hepatitis C screening test - Hepatitis C antibody  5. Hearing loss Possibly related to allergic rhinitis.  See item #6.  If no improvement, will refer to ENT for additional evaluation.  6. Allergic rhinitis - fluticasone (FLONASE) 50 MCG/ACT nasal spray; Place 2 sprays into both nostrils daily.  Dispense: 16 g; Refill: 12  7. Adult situational stress  disorder Stable.  Continue therapy and medication. - sertraline (ZOLOFT) 100 MG tablet; Take 1 tablet (100 mg total) by mouth daily.  Dispense: 90 tablet; Refill: 3   Fara Chute, PA-C Physician Assistant-Certified [facility]

## 2013-03-16 NOTE — Patient Instructions (Signed)
I will contact you with your lab results as soon as they are available.   If you have not heard from me in 2 weeks, please contact me.  The fastest way to get your results is to register for My Chart (see the instructions on the last page of this printout).  Keeping You Healthy  Get These Tests  Blood Pressure- Have your blood pressure checked by your healthcare provider at least once a year.  Normal blood pressure is 120/80.  Weight- Have your body mass index (BMI) calculated to screen for obesity.  BMI is a measure of body fat based on height and weight.  You can calculate your own BMI at www.nhlbisupport.com/bmi/  Cholesterol- Have your cholesterol checked every year.  Diabetes- Have your blood sugar checked every year if you have high blood pressure, high cholesterol, a family history of diabetes or if you are overweight.  Pap Smear- Have a pap smear every 1 to 3 years if you have been sexually active.  If you are older than 65 and recent pap smears have been normal you may not need additional pap smears.  In addition, if you have had a hysterectomy  For benign disease additional pap smears are not necessary.  Mammogram-Yearly mammograms are essential for early detection of breast cancer  Screening for Colon Cancer- Colonoscopy starting at age 50. Screening may begin sooner depending on your family history and other health conditions.  Follow up colonoscopy as directed by your Gastroenterologist.  Screening for Osteoporosis- Screening begins at age 65 with bone density scanning, sooner if you are at higher risk for developing Osteoporosis.  Get these medicines  Calcium with Vitamin D- Your body requires 1200-1500 mg of Calcium a day and 800-1000 IU of Vitamin D a day.  You can only absorb 500 mg of Calcium at a time therefore Calcium must be taken in 2 or 3 separate doses throughout the day.  Hormones- Hormone therapy has been associated with increased risk for certain cancers and  heart disease.  Talk to your healthcare provider about if you need relief from menopausal symptoms.  Aspirin- Ask your healthcare provider about taking Aspirin to prevent Heart Disease and Stroke.  Get these Immuniztions  Flu shot- Every fall  Pneumonia shot- Once after the age of 65; if you are younger ask your healthcare provider if you need a pneumonia shot.  Tetanus- Every ten years.  Zostavax- Once after the age of 60 to prevent shingles.  Take these steps  Don't smoke- Your healthcare provider can help you quit. For tips on how to quit, ask your healthcare provider or go to www.smokefree.gov or call 1-800 QUIT-NOW.  Be physically active- Exercise 5 days a week for a minimum of 30 minutes.  If you are not already physically active, start slow and gradually work up to 30 minutes of moderate physical activity.  Try walking, dancing, bike riding, swimming, etc.  Eat a healthy diet- Eat a variety of healthy foods such as fruits, vegetables, whole grains, low fat milk, low fat cheeses, yogurt, lean meats, chicken, fish, eggs, dried beans, tofu, etc.  For more information go to www.thenutritionsource.org  Dental visit- Brush and floss teeth twice daily; visit your dentist twice a year.  Eye exam- Visit your Optometrist or Ophthalmologist yearly.  Drink alcohol in moderation- Limit alcohol intake to one drink or less a day.  Never drink and drive.  Depression- Your emotional health is as important as your physical health.  If you're feeling   down or losing interest in things you normally enjoy, please talk to your healthcare provider.  Seat Belts- can save your life; always wear one  Smoke/Carbon Monoxide detectors- These detectors need to be installed on the appropriate level of your home.  Replace batteries at least once a year.  Violence- If anyone is threatening or hurting you, please tell your healthcare provider.  Living Will/ Health care power of attorney- Discuss with your  healthcare provider and family. 

## 2013-03-16 NOTE — Progress Notes (Signed)
  Tuberculosis Risk Questionnaire  1. No Were you born outside the [place] in one of the following parts of the world: [place], [place], [place], [place] or [place]?    2. No Have you traveled outside the [place] and lived for more than one month in one of the following parts of the world: [place], [place], [place], [place] or [place]?    3. No Do you have a compromised immune system such as from any of the following conditions:HIV/AIDS, organ or bone marrow transplantation, diabetes, immunosuppressive medicines (e.g. Prednisone, Remicaide), leukemia, lymphoma, cancer of the head or neck, gastrectomy or jejunal bypass, end-stage renal disease (on dialysis), or silicosis?     4. Yes [place], [facility] Have you ever or do you plan on working in: a residential care center, a health care facility, a jail or prison or homeless shelter?    5. No Have you ever: injected illegal drugs, used crack cocaine, lived in a homeless shelter  or been in jail or prison?     6. Yes "A patient and all follow up procedures were done with normal outcome" Have you ever been exposed to anyone with infectious tuberculosis?    Tuberculosis Symptom Questionnaire  Do you currently have any of the following symptoms?  1. No Unexplained cough lasting more than 3 weeks?   2. No Unexplained fever lasting more than 3 weeks.   3. No Night Sweats (sweating that leaves the bedclothes and sheets wet)     4. No Shortness of Breath   5. No Chest Pain   6. No Unintentional weight loss    7. No Unexplained fatigue (very tired for no reason)

## 2013-03-17 LAB — TSH: TSH: 2.56 u[IU]/mL (ref 0.350–4.500)

## 2013-03-17 LAB — HEPATITIS C ANTIBODY: HCV Ab: NEGATIVE

## 2013-03-18 ENCOUNTER — Ambulatory Visit (INDEPENDENT_AMBULATORY_CARE_PROVIDER_SITE_OTHER): Payer: BC Managed Care – PPO

## 2013-03-18 DIAGNOSIS — Z111 Encounter for screening for respiratory tuberculosis: Secondary | ICD-10-CM

## 2013-03-18 LAB — TB SKIN TEST
Induration: 0 mm
TB Skin Test: NEGATIVE

## 2013-06-05 ENCOUNTER — Telehealth: Payer: Self-pay

## 2013-06-05 NOTE — Telephone Encounter (Signed)
Error

## 2013-08-17 ENCOUNTER — Ambulatory Visit (INDEPENDENT_AMBULATORY_CARE_PROVIDER_SITE_OTHER): Payer: BC Managed Care – PPO | Admitting: Physician Assistant

## 2013-08-17 ENCOUNTER — Encounter: Payer: Self-pay | Admitting: Physician Assistant

## 2013-08-17 VITALS — BP 120/62 | HR 93 | Temp 98.3°F | Resp 16 | Ht 61.75 in | Wt 156.8 lb

## 2013-08-17 DIAGNOSIS — M25519 Pain in unspecified shoulder: Secondary | ICD-10-CM

## 2013-08-17 DIAGNOSIS — M722 Plantar fascial fibromatosis: Secondary | ICD-10-CM

## 2013-08-17 DIAGNOSIS — E785 Hyperlipidemia, unspecified: Secondary | ICD-10-CM

## 2013-08-17 DIAGNOSIS — M25512 Pain in left shoulder: Secondary | ICD-10-CM

## 2013-08-17 MED ORDER — MELOXICAM 15 MG PO TABS: ORAL_TABLET | ORAL | Status: DC

## 2013-08-17 NOTE — Progress Notes (Signed)
Subjective:    Patient ID: Lindsey Daniels, female    DOB: 1955-09-17, 58 y.o.   MRN: [id number]   PCP: Arian Murley, PA-C  Chief Complaint  Patient presents with  . Follow-up    Cholesterol  . Medication Refill    Meloxicam 15 mg    Medications, allergies, past medical history, surgical history, family history, social history and problem list reviewed and updated.  Patient Active Problem List   Diagnosis Date Noted  . Bilateral leg pain 09/07/2012  . Foot pain, bilateral 09/07/2012  . Plantar fasciitis BILATERAL 08/25/2012  . Hyperlipidemia 12/24/2011    Prior to Admission medications   Medication Sig Start Date End Date Taking? Authorizing Provider  fluticasone (FLONASE) 50 MCG/ACT nasal spray Place 2 sprays into both nostrils daily. 03/16/13  Yes October Peery S Schneur Crowson, PA-C  ibuprofen (ADVIL,MOTRIN) 200 MG tablet Take 200 mg by mouth every 6 (six) hours as needed.   Yes Historical Provider, MD  meloxicam (MOBIC) 15 MG tablet TAKE ONE TABLET BY MOUTH ONE TIME DAILY  11/30/12  Yes Kendell Bane, DPM  Multiple Vitamin (MULTIVITAMIN) tablet Take 1 tablet by mouth daily.   Yes Historical Provider, MD  sertraline (ZOLOFT) 100 MG tablet Take 1 tablet (100 mg total) by mouth daily. 03/16/13  Yes Minnetta Sandora S Jordynne Mccown, PA-C  simvastatin (ZOCOR) 20 MG tablet Take 1 tablet (20 mg total) by mouth at bedtime. 03/16/13  Yes Fara Chute, PA-C    HPI  Presents for follow-up of hyperlipidemia.  Has worked hard on healthy eating.  She was exercising regularly, but then "fell off the wagon" for several months. Has started exercising again, in a friend's pool.  Has aggravated the pain in her LEFT shoulder and used up the meloxicam. She requests a refill.  Review of Systems No chest pain, SOB, HA, dizziness, vision change, N/V, diarrhea, constipation, dysuria, urinary urgency or frequency or rash.     Objective:   Physical Exam  Vitals reviewed. Constitutional: She is oriented to person,  place, and time. Vital signs are normal. She appears well-developed and well-nourished. She is active and cooperative. No distress.  BP 120/62  Pulse 93  Temp(Src) 98.3 F (36.8 C) (Oral)  Resp 16  Ht 5' 1.75" (1.568 m)  Wt 156 lb 12.8 oz (71.124 kg)  BMI 28.93 kg/m2  SpO2 97%  HENT:  Head: Normocephalic and atraumatic.  Right Ear: Hearing normal.  Left Ear: Hearing normal.  Eyes: Conjunctivae are normal. No scleral icterus.  Neck: Normal range of motion. Neck supple. No thyromegaly present.  Cardiovascular: Normal rate, regular rhythm and normal heart sounds.   Pulses:      Radial pulses are 2+ on the right side, and 2+ on the left side.  Pulmonary/Chest: Effort normal and breath sounds normal.  Lymphadenopathy:       Head (right side): No tonsillar, no preauricular, no posterior auricular and no occipital adenopathy present.       Head (left side): No tonsillar, no preauricular, no posterior auricular and no occipital adenopathy present.    She has no cervical adenopathy.       Right: No supraclavicular adenopathy present.       Left: No supraclavicular adenopathy present.  Neurological: She is alert and oriented to person, place, and time. No sensory deficit.  Skin: Skin is warm, dry and intact. No rash noted. No cyanosis or erythema. Nails show no clubbing.  Psychiatric: She has a normal mood and affect.  Assessment & Plan:  1. Hyperlipidemia Await lab results.  Encouraged healthy eating and exercise. - Comprehensive metabolic panel - Lipid panel  2. Plantar fasciitis BILATERAL 3. Pain in joint, shoulder region, left - Meloxicam refilled   Fara Chute, PA-C Physician Assistant-Certified [facility]

## 2013-08-17 NOTE — Patient Instructions (Signed)
I will contact you with your lab results as soon as they are available.   If you have not heard from me in 2 weeks, please contact me.  The fastest way to get your results is to register for My Chart (see the instructions on the last page of this printout).   

## 2013-08-18 ENCOUNTER — Emergency Department (HOSPITAL_BASED_OUTPATIENT_CLINIC_OR_DEPARTMENT_OTHER): Payer: No Typology Code available for payment source

## 2013-08-18 ENCOUNTER — Encounter: Payer: Self-pay | Admitting: Physician Assistant

## 2013-08-18 ENCOUNTER — Inpatient Hospital Stay (HOSPITAL_COMMUNITY): Payer: No Typology Code available for payment source

## 2013-08-18 ENCOUNTER — Inpatient Hospital Stay (HOSPITAL_BASED_OUTPATIENT_CLINIC_OR_DEPARTMENT_OTHER)
Admission: EM | Admit: 2013-08-18 | Discharge: 2013-08-19 | DRG: 101 | Disposition: A | Discharge status: Home / Self Care | Payer: No Typology Code available for payment source | Attending: Internal Medicine | Admitting: Internal Medicine

## 2013-08-18 ENCOUNTER — Encounter (HOSPITAL_BASED_OUTPATIENT_CLINIC_OR_DEPARTMENT_OTHER): Payer: Self-pay | Admitting: Emergency Medicine

## 2013-08-18 DIAGNOSIS — Z7982 Long term (current) use of aspirin: Secondary | ICD-10-CM | POA: Diagnosis not present

## 2013-08-18 DIAGNOSIS — G9389 Other specified disorders of brain: Secondary | ICD-10-CM

## 2013-08-18 DIAGNOSIS — E785 Hyperlipidemia, unspecified: Secondary | ICD-10-CM | POA: Diagnosis present

## 2013-08-18 DIAGNOSIS — I519 Heart disease, unspecified: Secondary | ICD-10-CM

## 2013-08-18 DIAGNOSIS — S62309A Unspecified fracture of unspecified metacarpal bone, initial encounter for closed fracture: Secondary | ICD-10-CM | POA: Diagnosis present

## 2013-08-18 DIAGNOSIS — Y92410 Unspecified street and highway as the place of occurrence of the external cause: Secondary | ICD-10-CM

## 2013-08-18 DIAGNOSIS — H269 Unspecified cataract: Secondary | ICD-10-CM | POA: Diagnosis present

## 2013-08-18 DIAGNOSIS — R569 Unspecified convulsions: Secondary | ICD-10-CM

## 2013-08-18 DIAGNOSIS — M1990 Unspecified osteoarthritis, unspecified site: Secondary | ICD-10-CM | POA: Diagnosis present

## 2013-08-18 DIAGNOSIS — F329 Major depressive disorder, single episode, unspecified: Secondary | ICD-10-CM | POA: Diagnosis present

## 2013-08-18 DIAGNOSIS — M79604 Pain in right leg: Secondary | ICD-10-CM

## 2013-08-18 DIAGNOSIS — I10 Essential (primary) hypertension: Secondary | ICD-10-CM | POA: Diagnosis present

## 2013-08-18 DIAGNOSIS — G939 Disorder of brain, unspecified: Secondary | ICD-10-CM | POA: Diagnosis present

## 2013-08-18 DIAGNOSIS — M79671 Pain in right foot: Secondary | ICD-10-CM

## 2013-08-18 DIAGNOSIS — F411 Generalized anxiety disorder: Secondary | ICD-10-CM | POA: Diagnosis present

## 2013-08-18 DIAGNOSIS — Q762 Congenital spondylolisthesis: Secondary | ICD-10-CM | POA: Diagnosis not present

## 2013-08-18 DIAGNOSIS — F3289 Other specified depressive episodes: Secondary | ICD-10-CM | POA: Diagnosis present

## 2013-08-18 DIAGNOSIS — M722 Plantar fascial fibromatosis: Secondary | ICD-10-CM

## 2013-08-18 DIAGNOSIS — R55 Syncope and collapse: Secondary | ICD-10-CM | POA: Diagnosis present

## 2013-08-18 DIAGNOSIS — G40909 Epilepsy, unspecified, not intractable, without status epilepticus: Secondary | ICD-10-CM | POA: Diagnosis present

## 2013-08-18 DIAGNOSIS — M79672 Pain in left foot: Secondary | ICD-10-CM

## 2013-08-18 DIAGNOSIS — M79605 Pain in left leg: Secondary | ICD-10-CM

## 2013-08-18 DIAGNOSIS — Z880 Allergy status to penicillin: Secondary | ICD-10-CM

## 2013-08-18 LAB — CBC WITH DIFFERENTIAL/PLATELET
BAND NEUTROPHILS: 1 % (ref 0–10)
BASOS PCT: 0 % (ref 0–1)
Basophils Absolute: 0 10*3/uL (ref 0.0–0.1)
EOS PCT: 3 % (ref 0–5)
Eosinophils Absolute: 0.3 10*3/uL (ref 0.0–0.7)
HCT: 42 % (ref 36.0–46.0)
HEMOGLOBIN: 14.4 g/dL (ref 12.0–15.0)
LYMPHS PCT: 23 % (ref 12–46)
Lymphs Abs: 2.1 10*3/uL (ref 0.7–4.0)
MCH: 29.6 pg (ref 26.0–34.0)
MCHC: 34.3 g/dL (ref 30.0–36.0)
MCV: 86.2 fL (ref 78.0–100.0)
MONO ABS: 0.7 10*3/uL (ref 0.1–1.0)
MONOS PCT: 8 % (ref 3–12)
NEUTROS PCT: 65 % (ref 43–77)
Neutro Abs: 6.1 10*3/uL (ref 1.7–7.7)
Platelets: 265 10*3/uL (ref 150–400)
RBC: 4.87 MIL/uL (ref 3.87–5.11)
RDW: 13.1 % (ref 11.5–15.5)
WBC: 9.2 10*3/uL (ref 4.0–10.5)

## 2013-08-18 LAB — COMPREHENSIVE METABOLIC PANEL
ALT: 24 U/L (ref 0–35)
AST: 33 U/L (ref 0–37)
Albumin: 4.4 g/dL (ref 3.5–5.2)
Alkaline Phosphatase: 110 U/L (ref 39–117)
BILIRUBIN TOTAL: 0.9 mg/dL (ref 0.2–1.2)
BUN: 10 mg/dL (ref 6–23)
CO2: 26 meq/L (ref 19–32)
Calcium: 9.6 mg/dL (ref 8.4–10.5)
Chloride: 103 mEq/L (ref 96–112)
Creat: 0.6 mg/dL (ref 0.50–1.10)
GLUCOSE: 95 mg/dL (ref 70–99)
Potassium: 4.2 mEq/L (ref 3.5–5.3)
Sodium: 141 mEq/L (ref 135–145)
TOTAL PROTEIN: 6.8 g/dL (ref 6.0–8.3)

## 2013-08-18 LAB — LIPID PANEL
CHOLESTEROL: 182 mg/dL (ref 0–200)
HDL: 62 mg/dL (ref >39)
LDL CALC: 93 mg/dL (ref 0–99)
TRIGLYCERIDES: 134 mg/dL (ref <150)
Total CHOL/HDL Ratio: 2.9 Ratio
VLDL: 27 mg/dL (ref 0–40)

## 2013-08-18 LAB — TROPONIN I: Troponin I: <0.3 ng/mL (ref <0.30)

## 2013-08-18 LAB — MAGNESIUM: MAGNESIUM: 1.9 mg/dL (ref 1.5–2.5)

## 2013-08-18 LAB — CBG MONITORING, ED: GLUCOSE-CAPILLARY: 117 mg/dL — AB (ref 70–99)

## 2013-08-18 LAB — PHOSPHORUS: Phosphorus: 4.1 mg/dL (ref 2.3–4.6)

## 2013-08-18 MED ORDER — SIMVASTATIN 20 MG PO TABS
20.0000 mg | ORAL_TABLET | Freq: Every day | ORAL | Status: DC
  Administered 2013-08-18: 20 mg via ORAL
  Filled 2013-08-18: qty 1

## 2013-08-18 MED ORDER — LEVETIRACETAM 500 MG PO TABS
500.0000 mg | ORAL_TABLET | Freq: Two times a day (BID) | ORAL | Status: DC
  Administered 2013-08-19: 500 mg via ORAL
  Filled 2013-08-18: qty 1

## 2013-08-18 MED ORDER — SERTRALINE HCL 100 MG PO TABS
100.0000 mg | ORAL_TABLET | Freq: Every day | ORAL | Status: DC
  Administered 2013-08-18: 100 mg via ORAL
  Filled 2013-08-18: qty 1

## 2013-08-18 MED ORDER — ONDANSETRON HCL 4 MG/2ML IJ SOLN: 4.0000 mg | Freq: Four times a day (QID) | INTRAMUSCULAR | Status: DC | PRN

## 2013-08-18 MED ORDER — FLUTICASONE PROPIONATE 50 MCG/ACT NA SUSP
1.0000 | Freq: Two times a day (BID) | NASAL | Status: DC
  Filled 2013-08-18 (×2): qty 16

## 2013-08-18 MED ORDER — HYDROCODONE-ACETAMINOPHEN 5-325 MG PO TABS
1.0000 | ORAL_TABLET | Freq: Four times a day (QID) | ORAL | Status: DC | PRN
  Administered 2013-08-18 – 2013-08-19 (×2): 1 via ORAL
  Filled 2013-08-18 (×2): qty 1

## 2013-08-18 MED ORDER — ADULT MULTIVITAMIN W/MINERALS CH
1.0000 | ORAL_TABLET | Freq: Every day | ORAL | Status: DC
  Administered 2013-08-18: 1 via ORAL
  Filled 2013-08-18: qty 1

## 2013-08-18 MED ORDER — ACETAMINOPHEN 325 MG PO TABS: 650.0000 mg | ORAL_TABLET | Freq: Four times a day (QID) | ORAL | Status: DC | PRN

## 2013-08-18 MED ORDER — LEVETIRACETAM IN NACL 1000 MG/100ML IV SOLN
1000.0000 mg | Freq: Once | INTRAVENOUS | Status: AC
  Administered 2013-08-18: 1000 mg via INTRAVENOUS
  Filled 2013-08-18: qty 100

## 2013-08-18 MED ORDER — MELOXICAM 7.5 MG PO TABS: 15.0000 mg | ORAL_TABLET | Freq: Every day | ORAL | Status: DC | PRN

## 2013-08-18 MED ORDER — ASPIRIN EC 81 MG PO TBEC
81.0000 mg | DELAYED_RELEASE_TABLET | Freq: Every day | ORAL | Status: DC
  Administered 2013-08-18: 81 mg via ORAL
  Filled 2013-08-18: qty 1

## 2013-08-18 MED ORDER — GADOBENATE DIMEGLUMINE 529 MG/ML IV SOLN
15.0000 mL | Freq: Once | INTRAVENOUS | Status: AC | PRN
  Administered 2013-08-18: 15 mL via INTRAVENOUS
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
## 2013-08-18 NOTE — Progress Notes (Signed)
  Echocardiogram 2D Echocardiogram has been performed.  Lindsey Daniels 08/18/2013, 5:02 PM
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
## 2013-08-18 NOTE — ED Notes (Signed)
MD at bedside. 

## 2013-08-19 DIAGNOSIS — R569 Unspecified convulsions: Principal | ICD-10-CM

## 2013-08-19 LAB — COMPREHENSIVE METABOLIC PANEL
ALBUMIN: 3.9 g/dL (ref 3.5–5.2)
ALK PHOS: 114 U/L (ref 39–117)
ALT: 29 U/L (ref 0–35)
AST: 37 U/L (ref 0–37)
Anion gap: 15 (ref 5–15)
BILIRUBIN TOTAL: 0.8 mg/dL (ref 0.3–1.2)
BUN: 14 mg/dL (ref 6–23)
CHLORIDE: 101 meq/L (ref 96–112)
CO2: 25 mEq/L (ref 19–32)
Calcium: 9.8 mg/dL (ref 8.4–10.5)
Creatinine, Ser: 0.65 mg/dL (ref 0.50–1.10)
GFR calc Af Amer: >90 mL/min (ref >90)
GFR calc non Af Amer: >90 mL/min (ref >90)
Glucose, Bld: 110 mg/dL — ABNORMAL HIGH (ref 70–99)
Potassium: 4.2 mEq/L (ref 3.7–5.3)
Sodium: 141 mEq/L (ref 137–147)
Total Protein: 7.7 g/dL (ref 6.0–8.3)

## 2013-08-19 LAB — CBC
HEMATOCRIT: 40.1 % (ref 36.0–46.0)
Hemoglobin: 13.3 g/dL (ref 12.0–15.0)
MCH: 28.8 pg (ref 26.0–34.0)
MCHC: 33.2 g/dL (ref 30.0–36.0)
MCV: 86.8 fL (ref 78.0–100.0)
PLATELETS: 240 10*3/uL (ref 150–400)
RBC: 4.62 MIL/uL (ref 3.87–5.11)
RDW: 13.4 % (ref 11.5–15.5)
WBC: 9.2 10*3/uL (ref 4.0–10.5)

## 2013-08-19 LAB — PROTIME-INR
INR: 0.93 (ref 0.00–1.49)
Prothrombin Time: 12.5 seconds (ref 11.6–15.2)

## 2013-08-19 MED ORDER — LEVETIRACETAM 500 MG PO TABS: 500.0000 mg | ORAL_TABLET | Freq: Two times a day (BID) | ORAL | Status: DC

## 2013-08-19 MED ORDER — ACETAMINOPHEN 325 MG PO TABS: 650.0000 mg | ORAL_TABLET | Freq: Four times a day (QID) | ORAL | Status: DC | PRN
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
## 2013-08-28 ENCOUNTER — Other Ambulatory Visit: Payer: Self-pay | Admitting: Neurosurgery

## 2013-08-28 DIAGNOSIS — M4856XA Collapsed vertebra, not elsewhere classified, lumbar region, initial encounter for fracture: Secondary | ICD-10-CM | POA: Insufficient documentation

## 2013-08-28 DIAGNOSIS — D496 Neoplasm of unspecified behavior of brain: Secondary | ICD-10-CM | POA: Diagnosis present

## 2013-08-29 ENCOUNTER — Other Ambulatory Visit (HOSPITAL_COMMUNITY): Payer: Self-pay | Admitting: Neurosurgery

## 2013-08-29 DIAGNOSIS — D496 Neoplasm of unspecified behavior of brain: Secondary | ICD-10-CM

## 2013-09-18 ENCOUNTER — Encounter (HOSPITAL_COMMUNITY): Payer: Self-pay | Admitting: Pharmacy Technician

## 2013-09-20 ENCOUNTER — Encounter (HOSPITAL_COMMUNITY)
Admission: RE | Admit: 2013-09-20 | Discharge: 2013-09-20 | Disposition: A | Discharge status: Home / Self Care | Payer: BC Managed Care – PPO | Source: Ambulatory Visit | Attending: Neurosurgery | Admitting: Neurosurgery

## 2013-09-20 ENCOUNTER — Encounter (HOSPITAL_COMMUNITY): Payer: Self-pay

## 2013-09-20 ENCOUNTER — Ambulatory Visit (HOSPITAL_COMMUNITY)
Admission: RE | Admit: 2013-09-20 | Discharge: 2013-09-20 | Disposition: A | Discharge status: Home / Self Care | Payer: BC Managed Care – PPO | Source: Ambulatory Visit | Attending: Neurosurgery | Admitting: Neurosurgery

## 2013-09-20 DIAGNOSIS — Z01818 Encounter for other preprocedural examination: Secondary | ICD-10-CM | POA: Insufficient documentation

## 2013-09-20 DIAGNOSIS — Z01812 Encounter for preprocedural laboratory examination: Secondary | ICD-10-CM | POA: Insufficient documentation

## 2013-09-20 DIAGNOSIS — D496 Neoplasm of unspecified behavior of brain: Secondary | ICD-10-CM | POA: Insufficient documentation

## 2013-09-20 HISTORY — DX: Unspecified convulsions: R56.9

## 2013-09-20 LAB — BASIC METABOLIC PANEL
Anion gap: 13 (ref 5–15)
BUN: 19 mg/dL (ref 6–23)
CALCIUM: 9.4 mg/dL (ref 8.4–10.5)
CO2: 24 mEq/L (ref 19–32)
CREATININE: 0.71 mg/dL (ref 0.50–1.10)
Chloride: 105 mEq/L (ref 96–112)
GFR calc non Af Amer: >90 mL/min (ref >90)
Glucose, Bld: 84 mg/dL (ref 70–99)
Potassium: 4.1 mEq/L (ref 3.7–5.3)
Sodium: 142 mEq/L (ref 137–147)

## 2013-09-20 LAB — CBC
HCT: 38.1 % (ref 36.0–46.0)
Hemoglobin: 12.8 g/dL (ref 12.0–15.0)
MCH: 29.4 pg (ref 26.0–34.0)
MCHC: 33.6 g/dL (ref 30.0–36.0)
MCV: 87.4 fL (ref 78.0–100.0)
Platelets: 232 10*3/uL (ref 150–400)
RBC: 4.36 MIL/uL (ref 3.87–5.11)
RDW: 13 % (ref 11.5–15.5)
WBC: 5.7 10*3/uL (ref 4.0–10.5)

## 2013-09-20 LAB — SURGICAL PCR SCREEN
MRSA, PCR: POSITIVE — AB
STAPHYLOCOCCUS AUREUS: POSITIVE — AB

## 2013-09-20 LAB — PREPARE RBC (CROSSMATCH)

## 2013-09-20 LAB — ABO/RH: ABO/RH(D): B NEG

## 2013-09-20 MED ORDER — IOHEXOL 300 MG/ML  SOLN
100.0000 mL | Freq: Once | INTRAMUSCULAR | Status: AC | PRN
  Administered 2013-09-20: 100 mL via INTRAVENOUS

## 2013-09-20 NOTE — Pre-Procedure Instructions (Signed)
Lindsey Daniels  09/20/2013   Your procedure is scheduled on:  Aug 17th at 0730  Report to [facility] at [phone].  Call this number if you have problems the morning of surgery: [phone]   Remember:   Do not eat food or drink liquids after midnight.   Take these medicines the morning of surgery with A SIP OF WATER: Flonase spray if needed, Levetiracetam (Keppra)  Stop taking Aspirin, Ibuprofen, BC's, Goody's, Herbal medications, Fish Oil, Mobic 7 days before your surgery  Do not wear jewelry, make-up or nail polish.  Do not wear lotions, powders, or perfumes. You may wear deodorant.  Do not shave 48 hours prior to surgery. Men may shave face and neck.  Do not bring valuables to the hospital.  [facility] is not responsible for any belongings or valuables.               Contacts, dentures or bridgework may not be worn into surgery.  Leave suitcase in the car. After surgery it may be brought to your room.  For patients admitted to the hospital, discharge time is determined by your treatment team.               Patients discharged the day of surgery will not be allowed to drive home.    Special Instructions: [facility] - Preparing for Surgery  Before surgery, you can play an important role.  Because skin is not sterile, your skin needs to be as free of germs as possible.  You can reduce the number of germs on you skin by washing with CHG (chlorahexidine gluconate) soap before surgery.  CHG is an antiseptic cleaner which kills germs and bonds with the skin to continue killing germs even after washing.  Please DO NOT use if you have an allergy to CHG or antibacterial soaps.  If your skin becomes reddened/irritated stop using the CHG and inform your nurse when you arrive at Short Stay.  Do not shave (including legs and underarms) for at least 48 hours prior to the first CHG shower.  You may shave your face.  Please follow these instructions carefully:   1.  Shower  with CHG Soap the night before surgery and the                                morning of Surgery.  2.  If you choose to wash your hair, wash your hair first as usual with your       normal shampoo.  3.  After you shampoo, rinse your hair and body thoroughly to remove the                      Shampoo.  4.  Use CHG as you would any other liquid soap.  You can apply chg directly       to the skin and wash gently with scrungie or a clean washcloth.  5.  Apply the CHG Soap to your body ONLY FROM THE NECK DOWN.        Do not use on open wounds or open sores.  Avoid contact with your eyes,       ears, mouth and genitals (private parts).  Wash genitals (private parts)       with your normal soap.  6.  Wash thoroughly, paying special attention to the area where your surgery  will be performed.  7.  Thoroughly rinse your body with warm water from the neck down.  8.  DO NOT shower/wash with your normal soap after using and rinsing off       the CHG Soap.  9.  Pat yourself dry with a clean towel.            10.  Wear clean pajamas.            11.  Place clean sheets on your bed the night of your first shower and do not        sleep with pets.  Day of Surgery  Do not apply any lotions/deoderants the morning of surgery.  Please wear clean clothes to the hospital/surgery center.      Please read over the following fact sheets that you were given: Pain Booklet, Coughing and Deep Breathing, Blood Transfusion Information, MRSA Information and Surgical Site Infection Prevention
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
## 2013-10-01 MED ORDER — VANCOMYCIN HCL IN DEXTROSE 1-5 GM/200ML-% IV SOLN
1000.0000 mg | INTRAVENOUS | Status: AC
  Administered 2013-10-02: 1000 mg via INTRAVENOUS
  Filled 2013-10-01: qty 200

## 2013-10-02 ENCOUNTER — Inpatient Hospital Stay (HOSPITAL_COMMUNITY)
Admission: RE | Admit: 2013-10-02 | Discharge: 2013-10-04 | DRG: 027 | Disposition: A | Discharge status: Home / Self Care | Payer: BC Managed Care – PPO | Source: Ambulatory Visit | Attending: Neurosurgery | Admitting: Neurosurgery

## 2013-10-02 ENCOUNTER — Encounter (HOSPITAL_COMMUNITY): Payer: Self-pay | Admitting: *Deleted

## 2013-10-02 ENCOUNTER — Encounter (HOSPITAL_COMMUNITY)
Admission: RE | Disposition: A | Discharge status: Home / Self Care | Payer: Self-pay | Source: Ambulatory Visit | Attending: Neurosurgery

## 2013-10-02 ENCOUNTER — Inpatient Hospital Stay (HOSPITAL_COMMUNITY): Payer: BC Managed Care – PPO | Admitting: Certified Registered"

## 2013-10-02 ENCOUNTER — Encounter (HOSPITAL_COMMUNITY): Payer: BC Managed Care – PPO | Admitting: Certified Registered"

## 2013-10-02 DIAGNOSIS — Z79899 Other long term (current) drug therapy: Secondary | ICD-10-CM | POA: Diagnosis not present

## 2013-10-02 DIAGNOSIS — Q762 Congenital spondylolisthesis: Secondary | ICD-10-CM | POA: Diagnosis not present

## 2013-10-02 DIAGNOSIS — D496 Neoplasm of unspecified behavior of brain: Secondary | ICD-10-CM | POA: Diagnosis present

## 2013-10-02 DIAGNOSIS — F329 Major depressive disorder, single episode, unspecified: Secondary | ICD-10-CM | POA: Diagnosis present

## 2013-10-02 DIAGNOSIS — E785 Hyperlipidemia, unspecified: Secondary | ICD-10-CM | POA: Diagnosis present

## 2013-10-02 DIAGNOSIS — C719 Malignant neoplasm of brain, unspecified: Secondary | ICD-10-CM | POA: Diagnosis present

## 2013-10-02 DIAGNOSIS — G939 Disorder of brain, unspecified: Secondary | ICD-10-CM | POA: Diagnosis present

## 2013-10-02 DIAGNOSIS — G40909 Epilepsy, unspecified, not intractable, without status epilepticus: Secondary | ICD-10-CM | POA: Diagnosis present

## 2013-10-02 DIAGNOSIS — Z7982 Long term (current) use of aspirin: Secondary | ICD-10-CM

## 2013-10-02 DIAGNOSIS — F3289 Other specified depressive episodes: Secondary | ICD-10-CM | POA: Diagnosis present

## 2013-10-02 HISTORY — PX: CRANIOTOMY: SHX93

## 2013-10-02 SURGERY — CRANIOTOMY TUMOR EXCISION
Anesthesia: General

## 2013-10-02 MED ORDER — SODIUM CHLORIDE 0.9 % IV SOLN
INTRAVENOUS | Status: DC | PRN
  Administered 2013-10-02: 08:00:00 via INTRAVENOUS

## 2013-10-02 MED ORDER — HYDRALAZINE HCL 20 MG/ML IJ SOLN: 5.0000 mg | INTRAMUSCULAR | Status: DC | PRN

## 2013-10-02 MED ORDER — NEOSTIGMINE METHYLSULFATE 10 MG/10ML IV SOLN
INTRAVENOUS | Status: DC | PRN
  Administered 2013-10-02: 3.5 mg via INTRAVENOUS

## 2013-10-02 MED ORDER — SODIUM CHLORIDE 0.9 % IR SOLN
Status: DC
  Filled 2013-10-02: qty 2

## 2013-10-02 MED ORDER — FENTANYL CITRATE 0.05 MG/ML IJ SOLN
INTRAMUSCULAR | Status: DC | PRN
  Administered 2013-10-02 (×4): 50 ug via INTRAVENOUS
  Administered 2013-10-02: 100 ug via INTRAVENOUS
  Administered 2013-10-02: 50 ug via INTRAVENOUS

## 2013-10-02 MED ORDER — HYDROCODONE-ACETAMINOPHEN 5-325 MG PO TABS
1.0000 | ORAL_TABLET | ORAL | Status: DC | PRN
  Administered 2013-10-03 – 2013-10-04 (×2): 1 via ORAL
  Filled 2013-10-02 (×2): qty 1

## 2013-10-02 MED ORDER — LIDOCAINE-EPINEPHRINE 1 %-1:100000 IJ SOLN
INTRAMUSCULAR | Status: DC | PRN
  Administered 2013-10-02: 20 mL

## 2013-10-02 MED ORDER — SIMVASTATIN 20 MG PO TABS
20.0000 mg | ORAL_TABLET | Freq: Every day | ORAL | Status: DC
  Administered 2013-10-03: 20 mg via ORAL
  Filled 2013-10-02 (×3): qty 1

## 2013-10-02 MED ORDER — FAMOTIDINE IN NACL 20-0.9 MG/50ML-% IV SOLN
20.0000 mg | INTRAVENOUS | Status: DC
  Filled 2013-10-02: qty 50

## 2013-10-02 MED ORDER — PROPOFOL 10 MG/ML IV BOLUS
INTRAVENOUS | Status: AC
  Filled 2013-10-02: qty 20

## 2013-10-02 MED ORDER — SCOPOLAMINE 1 MG/3DAYS TD PT72
MEDICATED_PATCH | TRANSDERMAL | Status: AC
  Filled 2013-10-02: qty 1

## 2013-10-02 MED ORDER — VECURONIUM BROMIDE 10 MG IV SOLR
INTRAVENOUS | Status: DC | PRN
  Administered 2013-10-02 (×2): 1 mg via INTRAVENOUS

## 2013-10-02 MED ORDER — BISACODYL 10 MG RE SUPP: 10.0000 mg | Freq: Every day | RECTAL | Status: DC | PRN

## 2013-10-02 MED ORDER — MAGNESIUM HYDROXIDE 400 MG/5ML PO SUSP: 30.0000 mL | Freq: Every day | ORAL | Status: DC | PRN

## 2013-10-02 MED ORDER — SODIUM CHLORIDE 0.9 % IV SOLN
500.0000 mg | INTRAVENOUS | Status: DC | PRN
  Administered 2013-10-02: 500 mg via INTRAVENOUS

## 2013-10-02 MED ORDER — SERTRALINE HCL 100 MG PO TABS
100.0000 mg | ORAL_TABLET | Freq: Every day | ORAL | Status: DC
  Administered 2013-10-03: 100 mg via ORAL
  Filled 2013-10-02 (×3): qty 1

## 2013-10-02 MED ORDER — POTASSIUM CHLORIDE IN NACL 20-0.9 MEQ/L-% IV SOLN
INTRAVENOUS | Status: DC
  Administered 2013-10-02: 100 mL/h via INTRAVENOUS
  Administered 2013-10-02: 13:00:00 via INTRAVENOUS
  Filled 2013-10-02 (×4): qty 1000

## 2013-10-02 MED ORDER — THROMBIN 20000 UNITS EX KIT
PACK | CUTANEOUS | Status: DC | PRN
  Administered 2013-10-02: 20000 [IU] via TOPICAL

## 2013-10-02 MED ORDER — PROMETHAZINE HCL 25 MG/ML IJ SOLN: 6.2500 mg | INTRAMUSCULAR | Status: DC | PRN

## 2013-10-02 MED ORDER — SCOPOLAMINE 1 MG/3DAYS TD PT72
MEDICATED_PATCH | TRANSDERMAL | Status: DC | PRN
  Administered 2013-10-02: 1 via TRANSDERMAL

## 2013-10-02 MED ORDER — HEMOSTATIC AGENTS (NO CHARGE) OPTIME
TOPICAL | Status: DC | PRN
  Administered 2013-10-02: 1 via TOPICAL

## 2013-10-02 MED ORDER — FENTANYL CITRATE 0.05 MG/ML IJ SOLN
INTRAMUSCULAR | Status: AC
  Filled 2013-10-02: qty 5

## 2013-10-02 MED ORDER — LEVETIRACETAM IN NACL 500 MG/100ML IV SOLN
500.0000 mg | INTRAVENOUS | Status: DC
  Filled 2013-10-02: qty 100

## 2013-10-02 MED ORDER — MIDAZOLAM HCL 2 MG/2ML IJ SOLN
INTRAMUSCULAR | Status: AC
  Filled 2013-10-02: qty 2

## 2013-10-02 MED ORDER — DEXAMETHASONE SODIUM PHOSPHATE 4 MG/ML IJ SOLN: 4.0000 mg | Freq: Three times a day (TID) | INTRAMUSCULAR | Status: DC

## 2013-10-02 MED ORDER — MEPERIDINE HCL 25 MG/ML IJ SOLN: 6.2500 mg | INTRAMUSCULAR | Status: DC | PRN

## 2013-10-02 MED ORDER — LABETALOL HCL 5 MG/ML IV SOLN
INTRAVENOUS | Status: DC | PRN
  Administered 2013-10-02 (×4): 5 mg via INTRAVENOUS

## 2013-10-02 MED ORDER — DEXAMETHASONE SODIUM PHOSPHATE 10 MG/ML IJ SOLN
INTRAMUSCULAR | Status: AC
  Filled 2013-10-02: qty 1

## 2013-10-02 MED ORDER — LIDOCAINE HCL (CARDIAC) 20 MG/ML IV SOLN
INTRAVENOUS | Status: DC | PRN
  Administered 2013-10-02: 20 mg via INTRAVENOUS

## 2013-10-02 MED ORDER — ARTIFICIAL TEARS OP OINT
TOPICAL_OINTMENT | OPHTHALMIC | Status: DC | PRN
  Administered 2013-10-02: 1 via OPHTHALMIC

## 2013-10-02 MED ORDER — DEXAMETHASONE SODIUM PHOSPHATE 10 MG/ML IJ SOLN
6.0000 mg | Freq: Four times a day (QID) | INTRAMUSCULAR | Status: AC
  Administered 2013-10-02 – 2013-10-03 (×4): 6 mg via INTRAVENOUS
  Filled 2013-10-02: qty 0.6
  Filled 2013-10-02 (×3): qty 1

## 2013-10-02 MED ORDER — ROCURONIUM BROMIDE 100 MG/10ML IV SOLN
INTRAVENOUS | Status: DC | PRN
  Administered 2013-10-02: 50 mg via INTRAVENOUS

## 2013-10-02 MED ORDER — LABETALOL HCL 5 MG/ML IV SOLN: 5.0000 mg | INTRAVENOUS | Status: DC | PRN

## 2013-10-02 MED ORDER — FAMOTIDINE PREMIXED 20-0.9 MG/50ML-% IV SOLN
INTRAVENOUS | Status: DC | PRN
Start: 2013-10-02 — End: 2013-10-02
  Administered 2013-10-02: 20 mg via INTRAVENOUS

## 2013-10-02 MED ORDER — METHYLENE BLUE 1 % INJ SOLN
INTRAMUSCULAR | Status: DC | PRN
  Administered 2013-10-02: 10 mL

## 2013-10-02 MED ORDER — BUPIVACAINE HCL (PF) 0.5 % IJ SOLN
INTRAMUSCULAR | Status: DC | PRN
  Administered 2013-10-02: 30 mL

## 2013-10-02 MED ORDER — LABETALOL HCL 5 MG/ML IV SOLN
INTRAVENOUS | Status: AC
  Administered 2013-10-02: 20 mg
  Filled 2013-10-02: qty 4

## 2013-10-02 MED ORDER — OXYCODONE HCL 5 MG PO TABS: 5.0000 mg | ORAL_TABLET | Freq: Once | ORAL | Status: DC | PRN

## 2013-10-02 MED ORDER — LACTATED RINGERS IV SOLN
INTRAVENOUS | Status: DC | PRN
  Administered 2013-10-02 (×2): via INTRAVENOUS

## 2013-10-02 MED ORDER — ACETAMINOPHEN 10 MG/ML IV SOLN
INTRAVENOUS | Status: AC
  Administered 2013-10-02: 1000 mg via INTRAVENOUS
  Filled 2013-10-02: qty 100

## 2013-10-02 MED ORDER — DEXTROSE 5 % IV SOLN: 20.0000 mg | INTRAVENOUS | Status: DC | PRN

## 2013-10-02 MED ORDER — HYDROMORPHONE HCL PF 1 MG/ML IJ SOLN: 0.2500 mg | INTRAMUSCULAR | Status: DC | PRN

## 2013-10-02 MED ORDER — BACITRACIN 50000 UNITS IM SOLR
INTRAMUSCULAR | Status: DC | PRN
  Administered 2013-10-02: 09:00:00

## 2013-10-02 MED ORDER — SCOPOLAMINE 1 MG/3DAYS TD PT72
1.0000 | MEDICATED_PATCH | Freq: Once | TRANSDERMAL | Status: DC
  Administered 2013-10-02: 1.5 mg via TRANSDERMAL

## 2013-10-02 MED ORDER — SODIUM CHLORIDE 0.9 % IR SOLN
Status: DC | PRN
  Administered 2013-10-02: 3000 mL

## 2013-10-02 MED ORDER — ONDANSETRON HCL 4 MG/2ML IJ SOLN: 4.0000 mg | INTRAMUSCULAR | Status: DC | PRN

## 2013-10-02 MED ORDER — DEXAMETHASONE SODIUM PHOSPHATE 4 MG/ML IJ SOLN
4.0000 mg | Freq: Four times a day (QID) | INTRAMUSCULAR | Status: DC
Start: 2013-10-03 — End: 2013-10-03
  Administered 2013-10-03 (×2): 4 mg via INTRAVENOUS
  Filled 2013-10-02 (×4): qty 1

## 2013-10-02 MED ORDER — GENTAMICIN IN SALINE 1.6-0.9 MG/ML-% IV SOLN
80.0000 mg | INTRAVENOUS | Status: AC
  Administered 2013-10-02: 80 mg via INTRAVENOUS
  Filled 2013-10-02: qty 50

## 2013-10-02 MED ORDER — ONDANSETRON HCL 4 MG PO TABS: 4.0000 mg | ORAL_TABLET | ORAL | Status: DC | PRN

## 2013-10-02 MED ORDER — DEXAMETHASONE SODIUM PHOSPHATE 10 MG/ML IJ SOLN
INTRAMUSCULAR | Status: DC | PRN
  Administered 2013-10-02: 10 mg via INTRAVENOUS

## 2013-10-02 MED ORDER — GLYCOPYRROLATE 0.2 MG/ML IJ SOLN
INTRAMUSCULAR | Status: DC | PRN
  Administered 2013-10-02: .6 mg via INTRAVENOUS

## 2013-10-02 MED ORDER — PROPOFOL 10 MG/ML IV BOLUS
INTRAVENOUS | Status: DC | PRN
  Administered 2013-10-02: 100 mg via INTRAVENOUS
  Administered 2013-10-02 (×2): 30 mg via INTRAVENOUS

## 2013-10-02 MED ORDER — LEVETIRACETAM IN NACL 500 MG/100ML IV SOLN
500.0000 mg | Freq: Two times a day (BID) | INTRAVENOUS | Status: DC
  Administered 2013-10-02 – 2013-10-03 (×2): 500 mg via INTRAVENOUS
  Filled 2013-10-02 (×4): qty 100

## 2013-10-02 MED ORDER — PANTOPRAZOLE SODIUM 40 MG IV SOLR
40.0000 mg | Freq: Every day | INTRAVENOUS | Status: DC
  Administered 2013-10-02: 40 mg via INTRAVENOUS
  Filled 2013-10-02 (×2): qty 40

## 2013-10-02 MED ORDER — OXYCODONE HCL 5 MG/5ML PO SOLN: 5.0000 mg | Freq: Once | ORAL | Status: DC | PRN

## 2013-10-02 MED ORDER — MORPHINE SULFATE 2 MG/ML IJ SOLN
1.0000 mg | INTRAMUSCULAR | Status: DC | PRN
  Administered 2013-10-02 – 2013-10-03 (×4): 2 mg via INTRAVENOUS
  Filled 2013-10-02 (×4): qty 1

## 2013-10-02 MED FILL — Famotidine in NaCl 0.9% IV Soln 20 MG/50ML: INTRAVENOUS | Qty: 50 | Status: AC

## 2013-10-02 SURGICAL SUPPLY — 87 items
ACCESS SYS VIEWSITE 17X11X5CM (NEUROSURGERY SUPPLIES) ×1 IMPLANT
APPLICATOR COTTON TIP 6IN STRL (MISCELLANEOUS) ×2 IMPLANT
BAG DECANTER FOR FLEXI CONT (MISCELLANEOUS) ×2 IMPLANT
BALL CTTN LRG ABS STRL LF (GAUZE/BANDAGES/DRESSINGS)
BANDAGE GAUZE 4  KLING STR (GAUZE/BANDAGES/DRESSINGS) ×4 IMPLANT
BIT DRILL WIRE PASS 1.3MM (BIT) IMPLANT
BLADE SURG ROTATE 9660 (MISCELLANEOUS) ×4 IMPLANT
BLADE ULTRA TIP 2M (BLADE) ×1 IMPLANT
BRUSH SCRUB EZ PLAIN DRY (MISCELLANEOUS) ×2 IMPLANT
BUR ACORN 6.0 PRECISION (BURR) ×2 IMPLANT
BUR MATCHSTICK NEURO 3.0 LAGG (BURR) ×1 IMPLANT
BUR ROUTER D-58 CRANI (BURR) ×1 IMPLANT
CANISTER SUCT 3000ML (MISCELLANEOUS) ×2 IMPLANT
CLIP TI MEDIUM 6 (CLIP) IMPLANT
CONT SPEC 4OZ CLIKSEAL STRL BL (MISCELLANEOUS) ×7 IMPLANT
CORDS BIPOLAR (ELECTRODE) ×2 IMPLANT
COTTONBALL LRG STERILE PKG (GAUZE/BANDAGES/DRESSINGS) IMPLANT
DRAIN SNY WOU 7FLT (WOUND CARE) IMPLANT
DRAIN SUBARACHNOID (WOUND CARE) IMPLANT
DRAPE MICROSCOPE LEICA (MISCELLANEOUS) ×1 IMPLANT
DRAPE NEUROLOGICAL W/INCISE (DRAPES) ×2 IMPLANT
DRAPE STERI IOBAN 125X83 (DRAPES) IMPLANT
DRAPE SURG 17X23 STRL (DRAPES) IMPLANT
DRAPE WARM FLUID 44X44 (DRAPE) ×2 IMPLANT
DRILL WIRE PASS 1.3MM (BIT)
DRSG ADAPTIC 3X8 NADH LF (GAUZE/BANDAGES/DRESSINGS) ×2 IMPLANT
ELECT CAUTERY BLADE 6.4 (BLADE) ×2 IMPLANT
ELECT REM PT RETURN 9FT ADLT (ELECTROSURGICAL) ×2
ELECTRODE REM PT RTRN 9FT ADLT (ELECTROSURGICAL) ×1 IMPLANT
EVACUATOR 1/8 PVC DRAIN (DRAIN) IMPLANT
EVACUATOR SILICONE 100CC (DRAIN) IMPLANT
GAUZE SPONGE 4X4 12PLY STRL (GAUZE/BANDAGES/DRESSINGS) ×2 IMPLANT
GLOVE BIO SURGEON STRL SZ8.5 (GLOVE) ×2 IMPLANT
GLOVE BIOGEL PI IND STRL 7.0 (GLOVE) IMPLANT
GLOVE BIOGEL PI IND STRL 8 (GLOVE) ×1 IMPLANT
GLOVE BIOGEL PI INDICATOR 7.0 (GLOVE) ×1
GLOVE BIOGEL PI INDICATOR 8 (GLOVE) ×1
GLOVE ECLIPSE 6.5 STRL STRAW (GLOVE) ×1 IMPLANT
GLOVE ECLIPSE 7.5 STRL STRAW (GLOVE) ×2 IMPLANT
GLOVE EXAM NITRILE LRG STRL (GLOVE) IMPLANT
GLOVE EXAM NITRILE MD LF STRL (GLOVE) ×2 IMPLANT
GLOVE EXAM NITRILE XL STR (GLOVE) IMPLANT
GLOVE EXAM NITRILE XS STR PU (GLOVE) IMPLANT
GLOVE SS BIOGEL STRL SZ 8 (GLOVE) IMPLANT
GLOVE SUPERSENSE BIOGEL SZ 8 (GLOVE) ×1
GLOVE SURG SS PI 7.0 STRL IVOR (GLOVE) ×3 IMPLANT
GOWN STRL REUS W/ TWL LRG LVL3 (GOWN DISPOSABLE) IMPLANT
GOWN STRL REUS W/ TWL XL LVL3 (GOWN DISPOSABLE) IMPLANT
GOWN STRL REUS W/TWL 2XL LVL3 (GOWN DISPOSABLE) IMPLANT
GOWN STRL REUS W/TWL LRG LVL3 (GOWN DISPOSABLE) ×2
GOWN STRL REUS W/TWL XL LVL3 (GOWN DISPOSABLE) ×6
HEMOSTAT SURGICEL 2X14 (HEMOSTASIS) ×3 IMPLANT
HOOK DURA (MISCELLANEOUS) ×2 IMPLANT
KIT BASIN OR (CUSTOM PROCEDURE TRAY) ×2 IMPLANT
KIT ROOM TURNOVER OR (KITS) ×2 IMPLANT
MARKER SPHERE PSV REFLC NDI (MISCELLANEOUS) ×2 IMPLANT
NDL SPNL 22GX3.5 QUINCKE BK (NEEDLE) ×1 IMPLANT
NEEDLE SPNL 22GX3.5 QUINCKE BK (NEEDLE) ×2 IMPLANT
NS IRRIG 1000ML POUR BTL (IV SOLUTION) ×5 IMPLANT
PACK CRANIOTOMY (CUSTOM PROCEDURE TRAY) ×2 IMPLANT
PAD ARMBOARD 7.5X6 YLW CONV (MISCELLANEOUS) ×2 IMPLANT
PAD EYE OVAL STERILE LF (GAUZE/BANDAGES/DRESSINGS) IMPLANT
PATTIES SURGICAL .25X.25 (GAUZE/BANDAGES/DRESSINGS) IMPLANT
PATTIES SURGICAL .5 X1 (DISPOSABLE) ×1 IMPLANT
PATTIES SURGICAL 1/4 X 3 (GAUZE/BANDAGES/DRESSINGS) ×1 IMPLANT
PATTIES SURGICAL 3 X3 (GAUZE/BANDAGES/DRESSINGS)
PATTIES SURGICAL 3X3 (GAUZE/BANDAGES/DRESSINGS) IMPLANT
PIN MAYFIELD SKULL DISP (PIN) ×1 IMPLANT
PLATE 1.5  2HOLE MED NEURO (Plate) ×1 IMPLANT
PLATE 1.5 2HOLE MED NEURO (Plate) IMPLANT
PLATE 1.5 5HOLE SQUARE (Plate) ×2 IMPLANT
RUBBERBAND STERILE (MISCELLANEOUS) IMPLANT
SCREW SELF DRILL HT 1.5/4MM (Screw) ×10 IMPLANT
SPONGE SURGIFOAM ABS GEL 100 (HEMOSTASIS) ×2 IMPLANT
STAPLER SKIN PROX WIDE 3.9 (STAPLE) ×2 IMPLANT
SUT NURALON 4 0 TR CR/8 (SUTURE) ×6 IMPLANT
SUT VIC AB 0 CT1 18XCR BRD8 (SUTURE) ×2 IMPLANT
SUT VIC AB 0 CT1 8-18 (SUTURE) ×4
SUT VIC AB 2-0 CP2 18 (SUTURE) ×4 IMPLANT
SYR 20ML ECCENTRIC (SYRINGE) ×2 IMPLANT
SYR CONTROL 10ML LL (SYRINGE) ×2 IMPLANT
TAPE PAPER 2X10 WHT MICROPORE (GAUZE/BANDAGES/DRESSINGS) ×1 IMPLANT
TOWEL OR 17X24 6PK STRL BLUE (TOWEL DISPOSABLE) ×2 IMPLANT
TOWEL OR 17X26 10 PK STRL BLUE (TOWEL DISPOSABLE) ×2 IMPLANT
TRAY FOLEY CATH 16FRSI W/METER (SET/KITS/TRAYS/PACK) IMPLANT
UNDERPAD 30X30 INCONTINENT (UNDERPADS AND DIAPERS) ×2 IMPLANT
WATER STERILE IRR 1000ML POUR (IV SOLUTION) ×4 IMPLANT

## 2013-10-02 NOTE — Op Note (Signed)
10/02/2013  11:03 AM  PATIENT:  Lindsey Daniels  58 y.o. female  PRE-OPERATIVE DIAGNOSIS:  Brain tumor  POST-OPERATIVE DIAGNOSIS:  Brain Tumor  PROCEDURE:  Procedure(s):  Right temporal Craniotomy for resection of tumor with microdissection, microsurgical technique, and the operating microscope and with intraoperative stealth frameless stereotaxis   SURGEON:  Surgeon(s): Hosie Spangle, MD Ashok Pall, MD  ASSISTANTS: Ashok Pall, MD  ANESTHESIA:   general  EBL:  Total I/O In: 1300 [I.V.:1300] Out: 6 [Urine:140; Blood:150]  BLOOD ADMINISTERED:none  COUNT: Correct per nursing staff   SPECIMEN:  Source of Specimen:  brain tumor  DICTATION: Prior surgery the patient underwent a Stealth protocol CT of the brain as well as a standard MRI of the brain. Both studies were loaded into the Stealth station, and preoperative planning was performed. The patient was brought to the operating room, placed under general endotracheal anesthesia. The 3 pin Mayfield head holder was applied, a roll was placed beneath the right shoulder, and the patient was turned gently to the left. In the right temporal region was shaved, and the patient was registered to the Stealth unit, and the tumor localized. The scalp was then prepped with Betadine soap and solution, and draped in a sterile fashion. We then localized the tumor with the Stealth unit, and planned a horseshoe-shaped incision in the right temporal region. The line of the incision was infiltrated with local site with epinephrine. Incision was made, Raney clips were applied for scalp edge is to maintain hemostasis. The scalp was reflected caudally, with self-retaining scalp hooks.  The temporalis fascia and muscle were incised in a U-shaped fashion, and the temporalis fascia muscle reflected caudally, with self-retaining hooks.  We again localize the tumor with the Stealth unit, and a single burr hole was made, the dura dissected from the overlying  skull, and the craniotome was used to turn a bone flap. The patient did have a prominent mastoid air cell complex, was entered, and the air cells were waxed with bone wax thoroughly. The dura was tacked up around the margins the craniotomy with 4-0 Nurolon suture. The dura was then opened in a U-shaped fashion and hinged caudally. The cortical surface was examined, and with the Stealth unit, the tumor localized. We identified the gyrus overlying the tumor, and the pial surface was coagulated and incised in a horizontal fashion. Superficial dissection was done, and then the Dora Luna retractor system was set up, and we selected a 11 x 17 x 50 Vycor retractor. The operating microscope was draped, and brought in the field about instrumentation, illumination, and visualization, and the remainder the tumor resection was performed using microdissection microsurgical technique. Using the Stealth system, we gently passed the Vycor retractor down to the tumor. The tumor was identified, and we did enter the tumor cyst. Specimens of tumor were collected for both frozen and permanent section. Report of the frozen section by Dr. Joya Martyr was of lesional specimen with atypia. There is of abnormal tissue were removed, and sent to pathology for permanent examination, no gross total resection of the tumor was felt to be achieved. We then established hemostasis with the use of bipolar cautery. The was irrigated extensively with warm saline, and then the Vycor retractor was removed. The intradural exposure was further irrigated with warm saline, hemostasis was established and confirmed, and we then proceeded with closure. There was closed with interrupted 4-0 Nurolon sutures. The bone flap was secured to the skull with Lorenz cranial plates. The dura  was tacked up to the midportion of the bone flap with a single 4-0 Nurolon suture. The temporalis fascia and muscle were approximate interrupted undyed 0 Vicryl suture. The galea was  closed with interrupted inverted 2-0 Vicryl suture. Skin is approximate surgical staples. The wound was dressed with Adaptic and sterile gauze. Following surgery the patient was taken out of the 3 pin Mayfield head holder, reversed from the anesthetic, extubated, and transferred to the recovery room for further care. In the recovery room patient is awake, following commands, moving all 4 extremities well.  PLAN OF CARE: Admit to inpatient   PATIENT DISPOSITION:  PACU - hemodynamically stable.   Delay start of Pharmacological VTE agent (>24hrs) due to surgical blood loss or risk of bleeding:  yes
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
## 2013-10-02 NOTE — Progress Notes (Signed)
UR completed.  Latiya Navia, RN BSN [facility] CCM Trauma/Neuro ICU Case Manager [phone]  

## 2013-10-02 NOTE — Anesthesia Procedure Notes (Signed)
Procedure Name: Intubation Date/Time: 10/02/2013 8:01 AM Performed by: Storm Frisk E Pre-anesthesia Checklist: Patient identified, Timeout performed, Emergency Drugs available, Suction available and Patient being monitored Patient Re-evaluated:Patient Re-evaluated prior to inductionOxygen Delivery Method: Circle system utilized Preoxygenation: Pre-oxygenation with 100% oxygen Intubation Type: IV induction and Cricoid Pressure applied Ventilation: Mask ventilation without difficulty Laryngoscope Size: Mac and 3 Grade View: Grade I Tube type: Oral Tube size: 7.0 mm Number of attempts: 1 Airway Equipment and Method: Stylet Placement Confirmation: ETT inserted through vocal cords under direct vision,  breath sounds checked- equal and bilateral and positive ETCO2 Secured at: 21 cm Tube secured with: Tape Dental Injury: Teeth and Oropharynx as per pre-operative assessment

## 2013-10-02 NOTE — Progress Notes (Signed)
Subjective: Patient resting comfortably in bed. No nausea or vomiting.  Objective: Vital signs in last 24 hours: Filed Vitals:   10/02/13 1700 10/02/13 1800 10/02/13 1900 10/02/13 1938  BP: 100/53 118/61 109/60   Pulse: 62 72 70   Temp:    97.9 F (36.6 C)  TempSrc:    Oral  Resp: 11 14 16    Height:      Weight:      SpO2: 99% 100% 100%     Physical Exam:  Awake and alert, oriented. Following commands. Moving all 4 extremities well. No drift of upper extremities. Pupils equal, round, reactive to light. EOMI. Facial movements symmetrical. Dressing clean and dry. Foley to straight drainage.  Assessment/Plan: Patient doing well following surgery. If she does well through the night, we'll begin po's and begin to mobilize.   Hosie Spangle, MD 10/02/2013, 8:29 PM

## 2013-10-02 NOTE — Transfer of Care (Signed)
Immediate Anesthesia Transfer of Care Note  Patient: Lindsey Daniels  Procedure(s) Performed: Procedure(s) with comments: 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 (N/A) - 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  Patient Location: PACU  Anesthesia Type:General  Level of Consciousness: lethargic and responds to stimulation  Airway & Oxygen Therapy: Patient Spontanous Breathing and Patient connected to nasal cannula oxygen  Post-op Assessment: Report given to PACU RN and Post -op Vital signs reviewed and stable  Post vital signs: Reviewed and stable  Complications: No apparent anesthesia complications

## 2013-10-02 NOTE — Anesthesia Preprocedure Evaluation (Addendum)
Anesthesia Evaluation  Patient identified by MRN, date of birth, ID band Patient awake    Reviewed: Allergy & Precautions, H&P , NPO status , Patient's Chart, lab work & pertinent test results  History of Anesthesia Complications Negative for: history of anesthetic complications  Airway Mallampati: I TM Distance: >3 FB Neck ROM: Full    Dental  (+) Teeth Intact, Dental Advisory Given   Pulmonary neg pulmonary ROS,  breath sounds clear to auscultation  Pulmonary exam normal       Cardiovascular hypertension (off meds now), - anginaRhythm:Regular Rate:Normal     Neuro/Psych Anxiety Presented with syncope: temporal brain tumor    GI/Hepatic negative GI ROS, Neg liver ROS,   Endo/Other  negative endocrine ROS  Renal/GU negative Renal ROS     Musculoskeletal   Abdominal   Peds  Hematology negative hematology ROS (+)   Anesthesia Other Findings   Reproductive/Obstetrics negative OB ROS                          Anesthesia Physical Anesthesia Plan  ASA: III  Anesthesia Plan: General   Post-op Pain Management:    Induction: Intravenous  Airway Management Planned: Oral ETT  Additional Equipment: Arterial line  Intra-op Plan:   Post-operative Plan: Extubation in OR  Informed Consent: I have reviewed the patients History and Physical, chart, labs and discussed the procedure including the risks, benefits and alternatives for the proposed anesthesia with the patient or authorized representative who has indicated his/her understanding and acceptance.   Dental advisory given  Plan Discussed with: CRNA and Surgeon  Anesthesia Plan Comments: (Plan routine monitors, a-line, GETA)        Anesthesia Quick Evaluation

## 2013-10-02 NOTE — Anesthesia Postprocedure Evaluation (Signed)
  Anesthesia Post-op Note  Patient: Lindsey Daniels  Procedure(s) Performed: Procedure(s) with comments: 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 (N/A) - 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  Patient Location: PACU  Anesthesia Type:General  Level of Consciousness: awake, alert , oriented and patient cooperative  Airway and Oxygen Therapy: Patient Spontanous Breathing and Patient connected to nasal cannula oxygen  Post-op Pain: none  Post-op Assessment: Post-op Vital signs reviewed, Patient's Cardiovascular Status Stable, Respiratory Function Stable, Patent Airway, No signs of Nausea or vomiting and Pain level controlled  Post-op Vital Signs: Reviewed and stable  Last Vitals:  Filed Vitals:   10/02/13 1155  BP:   Pulse: 70  Temp: 36.6 C  Resp: 17    Complications: No apparent anesthesia complications

## 2013-10-03 ENCOUNTER — Inpatient Hospital Stay (HOSPITAL_COMMUNITY): Payer: BC Managed Care – PPO

## 2013-10-03 LAB — TYPE AND SCREEN
ABO/RH(D): B NEG
Antibody Screen: NEGATIVE

## 2013-10-03 LAB — CBC WITH DIFFERENTIAL/PLATELET
Basophils Absolute: 0 10*3/uL (ref 0.0–0.1)
Basophils Relative: 0 % (ref 0–1)
Eosinophils Absolute: 0 10*3/uL (ref 0.0–0.7)
Eosinophils Relative: 0 % (ref 0–5)
HCT: 33.1 % — ABNORMAL LOW (ref 36.0–46.0)
Hemoglobin: 11.3 g/dL — ABNORMAL LOW (ref 12.0–15.0)
Lymphocytes Relative: 4 % — ABNORMAL LOW (ref 12–46)
Lymphs Abs: 0.7 10*3/uL (ref 0.7–4.0)
MCH: 29.1 pg (ref 26.0–34.0)
MCHC: 34.1 g/dL (ref 30.0–36.0)
MCV: 85.3 fL (ref 78.0–100.0)
Monocytes Absolute: 0.4 10*3/uL (ref 0.1–1.0)
Monocytes Relative: 2 % — ABNORMAL LOW (ref 3–12)
Neutro Abs: 15.4 10*3/uL — ABNORMAL HIGH (ref 1.7–7.7)
Neutrophils Relative %: 94 % — ABNORMAL HIGH (ref 43–77)
Platelets: 212 10*3/uL (ref 150–400)
RBC: 3.88 MIL/uL (ref 3.87–5.11)
RDW: 13.3 % (ref 11.5–15.5)
WBC: 16.5 10*3/uL — ABNORMAL HIGH (ref 4.0–10.5)

## 2013-10-03 LAB — BASIC METABOLIC PANEL
Anion gap: 13 (ref 5–15)
BUN: 11 mg/dL (ref 6–23)
CO2: 23 mEq/L (ref 19–32)
Calcium: 8.8 mg/dL (ref 8.4–10.5)
Chloride: 106 mEq/L (ref 96–112)
Creatinine, Ser: 0.48 mg/dL — ABNORMAL LOW (ref 0.50–1.10)
GFR calc Af Amer: >90 mL/min (ref >90)
GFR calc non Af Amer: >90 mL/min (ref >90)
Glucose, Bld: 158 mg/dL — ABNORMAL HIGH (ref 70–99)
Potassium: 4.2 mEq/L (ref 3.7–5.3)
Sodium: 142 mEq/L (ref 137–147)

## 2013-10-03 MED ORDER — DEXAMETHASONE 4 MG PO TABS
4.0000 mg | ORAL_TABLET | Freq: Three times a day (TID) | ORAL | Status: DC
  Administered 2013-10-04 (×2): 4 mg via ORAL
  Filled 2013-10-03 (×5): qty 1

## 2013-10-03 MED ORDER — DEXAMETHASONE 4 MG PO TABS: 4.0000 mg | ORAL_TABLET | Freq: Three times a day (TID) | ORAL | Status: DC

## 2013-10-03 MED ORDER — PANTOPRAZOLE SODIUM 40 MG PO TBEC
40.0000 mg | DELAYED_RELEASE_TABLET | Freq: Every day | ORAL | Status: DC
  Administered 2013-10-03 – 2013-10-04 (×2): 40 mg via ORAL
  Filled 2013-10-03: qty 1

## 2013-10-03 MED ORDER — GADOBENATE DIMEGLUMINE 529 MG/ML IV SOLN
15.0000 mL | Freq: Once | INTRAVENOUS | Status: AC
  Administered 2013-10-03: 15 mL via INTRAVENOUS

## 2013-10-03 MED ORDER — LEVETIRACETAM 500 MG PO TABS
500.0000 mg | ORAL_TABLET | Freq: Two times a day (BID) | ORAL | Status: DC
  Administered 2013-10-03 – 2013-10-04 (×3): 500 mg via ORAL
  Filled 2013-10-03 (×5): qty 1

## 2013-10-03 NOTE — Progress Notes (Signed)
Bio tech in to check current Jewitt TLSO brace. He found it to be broken and he placed a new one on patient.

## 2013-10-03 NOTE — Progress Notes (Signed)
Subjective: Patient resting comfortably in bed, without complaints. For MRI later this morning.  Objective: Vital signs in last 24 hours: Filed Vitals:   10/03/13 0500 10/03/13 0600 10/03/13 0700 10/03/13 0812  BP: 94/33 92/40 84/69  98/51  Pulse: 57 58 71 64  Temp:    98.1 F (36.7 C)  TempSrc:    Oral  Resp: 11 13 28 14   Height:      Weight:      SpO2: 94% 93% 99% 100%    Intake/Output from previous day: 08/17 0701 - 08/18 0700 In: 3053.3 [I.V.:2953.3; IV Piggyback:100] Out: 1890 [PIRJJ:8841; Blood:150] Intake/Output this shift:    Physical Exam:  Awake and alert, oriented. Following commands. Moving all 4 from his well. No drift. Dressing clean and dry.  CBC  Recent Labs  10/03/13 0527  WBC 16.5*  HGB 11.3*  HCT 33.1*  PLT 212   BMET  Recent Labs  10/03/13 0527  NA 142  K 4.2  CL 106  CO2 23  GLUCOSE 158*  BUN 11  CREATININE 0.48*  CALCIUM 8.8    Assessment/Plan: We'll begin to mobilize, initially out of bed to chair, and ambulate in the ICU several times today. We'll DC Foley and a line. Will advance to regular diet. We'll change IV to saline lock.   Hosie Spangle, MD 10/03/2013, 8:26 AM

## 2013-10-04 ENCOUNTER — Encounter (HOSPITAL_COMMUNITY): Payer: Self-pay | Admitting: Neurosurgery

## 2013-10-04 MED ORDER — HYDROCODONE-ACETAMINOPHEN 5-325 MG PO TABS: 1.0000 | ORAL_TABLET | ORAL | Status: DC | PRN

## 2013-10-04 NOTE — Progress Notes (Signed)
Pt being discharge home with family friend Kalman Shan). IV's removed. Reviewed all discharge instructions and answered all questions. Prescriptions reviewed and given to patient for discharge. Patient education and care plan completed. Patient is currently in no pain VS stable and states she "feels great and ready to go home".

## 2013-10-04 NOTE — Discharge Summary (Signed)
Physician Discharge Summary  Patient ID: Lindsey Daniels MRN: [id number] DOB/AGE: 1955-04-12 58 y.o.  Admit date: 10/02/2013 Discharge date: 10/04/2013  Admission Diagnoses:  Brain tumor  Discharge Diagnoses:  Brain tumor Active Problems:   Brain tumor   Discharged Condition: good  Hospital Course: Patient was admitted, underwent a right temporal craniotomy and resection of tumor. Postoperatively she has done well. She is up and living actively. Her wound is healing nicely. Neurologically she is intact.  She is being discharged to home with instructions regarding wound care and activities. She is to return for staple removal in 6 days. She's been given a prescription for Decadron taper (4 mg every 8 hours for 4 doses, then 4 mg every 12 hours 4 doses, then 2 mg every 12 hours for 4 doses, then 2 mg once a day for 2 doses, then 2 mg every other day for 2 doses, then discontinue).  Pathology is pending, and will be discussed with the patient and her return visit. Postoperative MRI showed excellent resection of the tumor.  Discharge Exam: Blood pressure 132/70, pulse 50, temperature 97.7 F (36.5 C), temperature source Oral, resp. rate 16, height 5\' 2"  (1.575 m), weight 77.9 kg (171 lb 11.8 oz), SpO2 97.00%.  Disposition: Home     Medication List         aspirin EC 81 MG tablet  Take 81 mg by mouth daily.     fluticasone 50 MCG/ACT nasal spray  Commonly known as:  FLONASE  Place 1 spray into both nostrils 2 (two) times daily as needed for allergies.     HYDROcodone-acetaminophen 5-325 MG per tablet  Commonly known as:  NORCO/VICODIN  Take 1-2 tablets by mouth every 4 (four) hours as needed for moderate pain or severe pain.     levETIRAcetam 500 MG tablet  Commonly known as:  KEPPRA  Take 1 tablet (500 mg total) by mouth 2 (two) times daily.     meloxicam 15 MG tablet  Commonly known as:  MOBIC  Take 15 mg by mouth daily.     multivitamin with minerals Tabs tablet  Take 1  tablet by mouth at bedtime.     sertraline 100 MG tablet  Commonly known as:  ZOLOFT  Take 100 mg by mouth at bedtime.     simvastatin 20 MG tablet  Commonly known as:  ZOCOR  Take 1 tablet (20 mg total) by mouth at bedtime.     SYSTANE OP  Apply 1 drop to eye 2 (two) times daily as needed (for irritation).     valACYclovir 500 MG tablet  Commonly known as:  VALTREX  Take 500 mg by mouth 2 (two) times daily. Takes only if needed for breakouts         Signed: Hosie Spangle, MD 10/04/2013, 8:37 AM

## 2013-10-04 NOTE — Progress Notes (Signed)
Patient is becoming more bradycardic in the high 40's, earlier she was in the 50's and 60's.  Patient is sleeping at this time very comfortably and in no apparent distress. Notified on call physician to make aware, will continue to monitor accordingly.

## 2013-10-10 DIAGNOSIS — M545 Low back pain, unspecified: Secondary | ICD-10-CM | POA: Insufficient documentation

## 2013-10-17 ENCOUNTER — Encounter (HOSPITAL_COMMUNITY): Payer: Self-pay

## 2013-10-17 DIAGNOSIS — C712 Malignant neoplasm of temporal lobe: Secondary | ICD-10-CM | POA: Insufficient documentation

## 2013-11-24 DIAGNOSIS — Q762 Congenital spondylolisthesis: Secondary | ICD-10-CM | POA: Insufficient documentation

## 2013-12-13 ENCOUNTER — Ambulatory Visit (INDEPENDENT_AMBULATORY_CARE_PROVIDER_SITE_OTHER): Payer: BC Managed Care – PPO | Admitting: *Deleted

## 2013-12-13 ENCOUNTER — Encounter: Payer: Self-pay | Admitting: *Deleted

## 2013-12-13 DIAGNOSIS — Z23 Encounter for immunization: Secondary | ICD-10-CM

## 2014-01-01 LAB — HM MAMMOGRAPHY

## 2014-01-03 ENCOUNTER — Other Ambulatory Visit: Payer: Self-pay | Admitting: Obstetrics and Gynecology

## 2014-01-04 LAB — CYTOLOGY - PAP

## 2014-01-19 DIAGNOSIS — M4300 Spondylolysis, site unspecified: Secondary | ICD-10-CM | POA: Insufficient documentation

## 2014-02-20 ENCOUNTER — Other Ambulatory Visit: Payer: Self-pay | Admitting: Physician Assistant

## 2014-03-05 DIAGNOSIS — M79673 Pain in unspecified foot: Secondary | ICD-10-CM

## 2014-03-06 ENCOUNTER — Encounter: Payer: Self-pay | Admitting: Physician Assistant

## 2014-03-06 ENCOUNTER — Ambulatory Visit (INDEPENDENT_AMBULATORY_CARE_PROVIDER_SITE_OTHER): Payer: BLUE CROSS/BLUE SHIELD | Admitting: Physician Assistant

## 2014-03-06 VITALS — BP 148/79 | HR 82 | Temp 97.6°F | Resp 16 | Ht 62.5 in | Wt 166.0 lb

## 2014-03-06 DIAGNOSIS — G5600 Carpal tunnel syndrome, unspecified upper limb: Secondary | ICD-10-CM

## 2014-03-06 DIAGNOSIS — C719 Malignant neoplasm of brain, unspecified: Secondary | ICD-10-CM

## 2014-03-06 DIAGNOSIS — E785 Hyperlipidemia, unspecified: Secondary | ICD-10-CM

## 2014-03-06 DIAGNOSIS — Z111 Encounter for screening for respiratory tuberculosis: Secondary | ICD-10-CM

## 2014-03-06 LAB — COMPREHENSIVE METABOLIC PANEL
ALT: 18 U/L (ref 0–35)
AST: 24 U/L (ref 0–37)
Albumin: 4.1 g/dL (ref 3.5–5.2)
Alkaline Phosphatase: 100 U/L (ref 39–117)
BILIRUBIN TOTAL: 0.8 mg/dL (ref 0.2–1.2)
BUN: 18 mg/dL (ref 6–23)
CO2: 25 meq/L (ref 19–32)
CREATININE: 0.62 mg/dL (ref 0.50–1.10)
Calcium: 9.2 mg/dL (ref 8.4–10.5)
Chloride: 104 mEq/L (ref 96–112)
GLUCOSE: 100 mg/dL — AB (ref 70–99)
Potassium: 4 mEq/L (ref 3.5–5.3)
Sodium: 138 mEq/L (ref 135–145)
Total Protein: 6.6 g/dL (ref 6.0–8.3)

## 2014-03-06 LAB — LIPID PANEL
CHOL/HDL RATIO: 3.1 ratio
Cholesterol: 166 mg/dL (ref 0–200)
HDL: 53 mg/dL (ref >39)
LDL CALC: 86 mg/dL (ref 0–99)
Triglycerides: 136 mg/dL (ref <150)
VLDL: 27 mg/dL (ref 0–40)

## 2014-03-06 NOTE — Patient Instructions (Signed)
Keep up the great work on being the fabulous you!
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
## 2014-03-08 ENCOUNTER — Ambulatory Visit: Payer: BLUE CROSS/BLUE SHIELD

## 2014-03-08 DIAGNOSIS — Z111 Encounter for screening for respiratory tuberculosis: Secondary | ICD-10-CM

## 2014-03-08 LAB — TB SKIN TEST
Induration: 0 mm
TB Skin Test: NEGATIVE

## 2014-03-12 ENCOUNTER — Telehealth: Payer: Self-pay | Admitting: *Deleted

## 2014-03-12 NOTE — Telephone Encounter (Signed)
Patient dropped off and paid for orthotics to be refurbished.  We will call patient when they return

## 2014-04-08 ENCOUNTER — Encounter: Payer: Self-pay | Admitting: Physician Assistant

## 2014-04-16 ENCOUNTER — Other Ambulatory Visit: Payer: Self-pay | Admitting: Physician Assistant

## 2014-04-17 NOTE — Telephone Encounter (Signed)
If the med wasn't discussed, then best to route to the PA Provider pool.  Usually, it'll be ok, especially when the note has a clear follow-up plan documented, but best to send it to the pool, just in case.

## 2014-04-17 NOTE — Telephone Encounter (Signed)
You saw pt for check up in Jan w/instr's to RTC in 6 mos, but don't see this med discussed. Can we RF? Also in these situations when you (or another provider)  have advised pt you don't need to see them for another 6 mos, but there are no notes or Dxs relevant to the chronic med req'd at that visit can we still RF the med until pt is due for f/up or do we need to get it approved by provider?

## 2014-07-02 ENCOUNTER — Other Ambulatory Visit: Payer: Self-pay | Admitting: Physician Assistant

## 2014-08-26 ENCOUNTER — Ambulatory Visit (INDEPENDENT_AMBULATORY_CARE_PROVIDER_SITE_OTHER): Payer: Self-pay | Admitting: Emergency Medicine

## 2014-08-26 ENCOUNTER — Ambulatory Visit (INDEPENDENT_AMBULATORY_CARE_PROVIDER_SITE_OTHER): Payer: BLUE CROSS/BLUE SHIELD

## 2014-08-26 DIAGNOSIS — R7612 Nonspecific reaction to cell mediated immunity measurement of gamma interferon antigen response without active tuberculosis: Secondary | ICD-10-CM

## 2014-08-26 NOTE — Progress Notes (Signed)
Subjective:  Patient ID: Lindsey Daniels, female    DOB: 29-Sep-1955  Age: 59 y.o. MRN: [id number]  CC: No chief complaint on file.   HPI Lindsey Daniels presents  for a chest x-ray following positive tuberculosis test. She is asymptomatic.  History Lindsey Daniels has a past medical history of Hyperlipidemia; Depression; Anxiety; Elevated LFTs; Hyperglycemia; Anal fissure; Spondylolisthesis; Pars defect of lumbar spine; Osteoarthritis; Cataract; Allergy; Hypertension; and Seizures.   She has past surgical history that includes Heel spur surgery; cataract; and Craniotomy (N/A, 10/02/2013).   Her  family history includes Alcohol abuse in her brother; COPD in her father; Mental illness in her father.  She   reports that she has never smoked. She has never used smokeless tobacco. She reports that she drinks alcohol. She reports that she does not use illicit drugs.  Outpatient Prescriptions Prior to Visit  Medication Sig Dispense Refill  . aspirin EC 81 MG tablet Take 81 mg by mouth daily.    . fluticasone (FLONASE) 50 MCG/ACT nasal spray Place 1 spray into both nostrils 2 (two) times daily as needed for allergies.     Marland Kitchen HYDROcodone-acetaminophen (NORCO/VICODIN) 5-325 MG per tablet Take 1-2 tablets by mouth every 4 (four) hours as needed for moderate pain or severe pain. 40 tablet 0  . levETIRAcetam (KEPPRA) 500 MG tablet Take 1 tablet (500 mg total) by mouth 2 (two) times daily. 60 tablet 0  . meloxicam (MOBIC) 15 MG tablet Take 15 mg by mouth daily.    . Multiple Vitamin (MULTIVITAMIN WITH MINERALS) TABS tablet Take 1 tablet by mouth at bedtime.    Vladimir Faster Glycol-Propyl Glycol (SYSTANE OP) Apply 1 drop to eye 2 (two) times daily as needed (for irritation).    . sertraline (ZOLOFT) 100 MG tablet Take 100 mg by mouth at bedtime.     . sertraline (ZOLOFT) 100 MG tablet TAKE ONE TABLET BY MOUTH ONE TIME DAILY  90 tablet 4  . simvastatin (ZOCOR) 20 MG tablet TAKE ONE TABLET BY MOUTH AT BEDTIME 30 tablet  1  . valACYclovir (VALTREX) 500 MG tablet Take 500 mg by mouth 2 (two) times daily. Takes only if needed for breakouts     No facility-administered medications prior to visit.    History   Social History  . Marital Status: Married    Spouse Name: separated-no papers file  . Number of Children: 0  . Years of Education: N/A   Occupational History  . 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 Smoking status: Never Smoker   . Smokeless tobacco: Never Used  . Alcohol Use: 0.0 - 2.0 oz/week    0-4 drink(s) per week     Comment: social  . Drug Use: No  . Sexual Activity:    Partners: Male   Other Topics Concern  . Not on file   Social History Narrative   Separated from second husband.  Very contentious split.     Review of Systems  Objective:  There were no vitals taken for this visit.  Physical Exam    Assessment & Plan:   Diagnoses and all orders for this visit:  Positive QuantiFERON-TB Gold test Orders: -     DG Chest 2 View   I am having Lindsey Daniels maintain her sertraline, multivitamin with minerals, fluticasone, levETIRAcetam, aspirin EC, meloxicam, Polyethyl Glycol-Propyl Glycol (SYSTANE OP), valACYclovir, HYDROcodone-acetaminophen, sertraline, and simvastatin.  No orders of the defined types  were placed in this encounter.    Appropriate red flag conditions were discussed with the patient as well as actions that should be taken.  Patient expressed his understanding.  Follow-up: Return if symptoms worsen or fail to improve.  Roselee Culver, MD   [employer] reading (PRIMARY) by  Dr. Ouida Sills negative chest x-ray.

## 2014-09-03 ENCOUNTER — Telehealth: Payer: Self-pay | Admitting: Family Medicine

## 2014-09-03 NOTE — Telephone Encounter (Signed)
Lindsey Daniels is requesting to talk with Dr Tamala Julian. The work number she can be reached at until 5:00pm is [phone] After 5:00 she can be reached at [phone]

## 2014-09-04 ENCOUNTER — Ambulatory Visit (INDEPENDENT_AMBULATORY_CARE_PROVIDER_SITE_OTHER): Payer: BLUE CROSS/BLUE SHIELD | Admitting: Physician Assistant

## 2014-09-04 ENCOUNTER — Encounter: Payer: Self-pay | Admitting: Physician Assistant

## 2014-09-04 VITALS — BP 130/74 | HR 90 | Temp 97.9°F | Resp 16 | Wt 148.0 lb

## 2014-09-04 DIAGNOSIS — R739 Hyperglycemia, unspecified: Secondary | ICD-10-CM | POA: Diagnosis not present

## 2014-09-04 DIAGNOSIS — E785 Hyperlipidemia, unspecified: Secondary | ICD-10-CM | POA: Diagnosis not present

## 2014-09-04 LAB — COMPREHENSIVE METABOLIC PANEL
ALBUMIN: 4.1 g/dL (ref 3.5–5.2)
ALK PHOS: 101 U/L (ref 39–117)
ALT: 21 U/L (ref 0–35)
AST: 27 U/L (ref 0–37)
BUN: 13 mg/dL (ref 6–23)
CO2: 27 mEq/L (ref 19–32)
Calcium: 9.8 mg/dL (ref 8.4–10.5)
Chloride: 105 mEq/L (ref 96–112)
Creat: 0.66 mg/dL (ref 0.50–1.10)
Glucose, Bld: 88 mg/dL (ref 70–99)
Potassium: 4.1 mEq/L (ref 3.5–5.3)
Sodium: 142 mEq/L (ref 135–145)
TOTAL PROTEIN: 7 g/dL (ref 6.0–8.3)
Total Bilirubin: 0.8 mg/dL (ref 0.2–1.2)

## 2014-09-04 LAB — LIPID PANEL
CHOL/HDL RATIO: 2.3 ratio
Cholesterol: 174 mg/dL (ref 0–200)
HDL: 75 mg/dL (ref >=46)
LDL Cholesterol: 85 mg/dL (ref 0–99)
Triglycerides: 72 mg/dL (ref <150)
VLDL: 14 mg/dL (ref 0–40)

## 2014-09-04 LAB — GLUCOSE, POCT (MANUAL RESULT ENTRY): POC Glucose: 95 mg/dl (ref 70–99)

## 2014-09-04 NOTE — Progress Notes (Signed)
Patient ID: Lindsey Daniels, female    DOB: 24-May-1955, 59 y.o.   MRN: [id number]  PCP: Roshanda Balazs, PA-C  Subjective:   Chief Complaint  Patient presents with  . bloodwork    HPI Presents for evaluation of hyperlipidemia and hyperglycemia noted last visit.  She reports that she has been making small steps to eating a healthy diet -minimizing soda, increasing water intake, trying to limit sugar, cook more food at home, practice portion control and eat baked instead of fried chips.   Her lipid panel in January was within normal limits (see labs below) and the patient reports Interest in stopping statin therapy if her lipid levels look good today. She denies HA, fatigue, changes in vision, polydipsia, and urinary frequency.   Review of Systems Constitutional: Negative for activity change, appetite change and unexpected weight change.  Eyes: Negative for visual disturbance.  Respiratory: Negative for cough, chest tightness and shortness of breath.  Cardiovascular: Negative for chest pain and palpitations.  Gastrointestinal: Negative for abdominal pain.  Genitourinary: Negative for frequency.  Skin: Negative for color change, pallor, rash and wound.  Neurological: Negative for headaches.      Patient Active Problem List   Diagnosis Date Noted  . Carpal tunnel syndrome 03/06/2014  . Pleomorphic xanthoastrocytoma 10/02/2013  . Syncope 08/18/2013  . Convulsions/seizures 08/18/2013  . Bilateral leg pain 09/07/2012  . Foot pain, bilateral 09/07/2012  . Plantar fasciitis BILATERAL 08/25/2012  . Hyperlipidemia 12/24/2011     Prior to Admission medications   Medication Sig Start Date End Date Taking? Authorizing Provider  aspirin EC 81 MG tablet Take 81 mg by mouth daily.   Yes Historical Provider, MD  fluticasone (FLONASE) 50 MCG/ACT nasal spray Place 1 spray into both nostrils 2 (two) times daily as needed for allergies.    Yes Historical Provider, MD    HYDROcodone-acetaminophen (NORCO/VICODIN) 5-325 MG per tablet Take 1-2 tablets by mouth every 4 (four) hours as needed for moderate pain or severe pain. 10/04/13  Yes Jovita Gamma, MD  levETIRAcetam (KEPPRA) 500 MG tablet Take 1 tablet (500 mg total) by mouth 2 (two) times daily. 08/19/13  Yes Domenic Polite, MD  meloxicam (MOBIC) 15 MG tablet Take 15 mg by mouth daily.   Yes Historical Provider, MD  Multiple Vitamin (MULTIVITAMIN WITH MINERALS) TABS tablet Take 1 tablet by mouth at bedtime.   Yes Historical Provider, MD  Polyethyl Glycol-Propyl Glycol (SYSTANE OP) Apply 1 drop to eye 2 (two) times daily as needed (for irritation).   Yes Historical Provider, MD  sertraline (ZOLOFT) 100 MG tablet Take 100 mg by mouth at bedtime.    Yes Historical Provider, MD  sertraline (ZOLOFT) 100 MG tablet TAKE ONE TABLET BY MOUTH ONE TIME DAILY  04/17/14  Yes Karrington Studnicka, PA-C  simvastatin (ZOCOR) 20 MG tablet TAKE ONE TABLET BY MOUTH AT BEDTIME 07/03/14  Yes Jlynn Langille, PA-C  valACYclovir (VALTREX) 500 MG tablet Take 500 mg by mouth 2 (two) times daily. Takes only if needed for breakouts   Yes Historical Provider, MD     Allergies  Allergen Reactions  . Amoxicillin Rash       Objective:  Physical Exam  Constitutional: She is oriented to person, place, and time. She appears well-developed and well-nourished. No distress.  BP 130/74 mmHg  Pulse 90  Temp(Src) 97.9 F (36.6 C) (Oral)  Resp 16  Wt 148 lb (67.132 kg)   Eyes: Conjunctivae are normal. No scleral icterus.  Neck: No thyromegaly present.  Cardiovascular:  Normal rate, regular rhythm, normal heart sounds and intact distal pulses.   Pulmonary/Chest: Effort normal and breath sounds normal.  Lymphadenopathy:    She has no cervical adenopathy.  Neurological: She is alert and oriented to person, place, and time.  Skin: Skin is warm and dry.  Psychiatric: She has a normal mood and affect. Her behavior is normal.       Results for  orders placed or performed in visit on 09/04/14  POCT glucose (manual entry)  Result Value Ref Range   POC Glucose 95 70 - 99 mg/dl       Assessment & Plan:   1. Hyperlipidemia Await labs. If remains well controlled, she'd like to try stopping the statin.  - Lipid panel - Comprehensive metabolic panel  2. Hyperglycemia Normal today. No need to initiate treatment. Continue lifestyle changes for risk reduction. - POCT glucose (manual entry)   Fara Chute, PA-C Physician Assistant-Certified [facility]

## 2014-09-04 NOTE — Progress Notes (Signed)
   Subjective:    Patient ID: Lindsey Daniels, female    DOB: 1956/02/13, 59 y.o.   MRN: [id number]  HPI  Patient with PMH of hyperlipidemia and diabetes presents for a cholesterol follow-up. She reports that she has been making small steps to eating a healthy diet -minimizing soda, increasing water intake, trying to limit sugar, cook more food at home, practice portion control and eat baked instead of fried chips. Her lipid panel in January was within normal limits (see labs below) and the patient reports Interest in stopping statin therapy if her lipid levels look good today. She denies HA, fatigue, changes in vision, polydipsia, and urinary frequency.    Review of Systems  Constitutional: Negative for activity change, appetite change and unexpected weight change.  Eyes: Negative for visual disturbance.  Respiratory: Negative for cough, chest tightness and shortness of breath.   Cardiovascular: Negative for chest pain and palpitations.  Gastrointestinal: Negative for abdominal pain.  Genitourinary: Negative for frequency.  Skin: Negative for color change, pallor, rash and wound.  Neurological: Negative for headaches.       Objective:   Physical Exam  Constitutional: She is oriented to person, place, and time. She appears well-developed and well-nourished. No distress.  BP 130/74 mmHg  Pulse 90  Temp(Src) 97.9 F (36.6 C) (Oral)  Resp 16  Wt 148 lb (67.132 kg)    HENT:  Head: Normocephalic and atraumatic.  Eyes: Right eye exhibits no discharge. Left eye exhibits no discharge. No scleral icterus.  Neck: Normal range of motion. Neck supple. No tracheal deviation present. No thyromegaly present.  Cardiovascular: Normal rate, regular rhythm and normal heart sounds.  Exam reveals no gallop and no friction rub.   No murmur heard. Pulmonary/Chest: Breath sounds normal. No respiratory distress. She has no wheezes. She has no rales.  Neurological: She is alert and oriented to person,  place, and time. She has normal reflexes.  Skin: Skin is warm and dry. No rash noted. She is not diaphoretic. No erythema. No pallor.  Psychiatric: She has a normal mood and affect. Her behavior is normal. Judgment and thought content normal.   Lab Results  Component Value Date   CHOL 166 03/06/2014   HDL 53 03/06/2014   LDLCALC 86 03/06/2014   TRIG 136 03/06/2014   CHOLHDL 3.1 03/06/2014   Results for orders placed or performed in visit on 09/04/14  POCT glucose (manual entry)  Result Value Ref Range   POC Glucose 95 70 - 99 mg/dl      Assessment & Plan:  1. Hyperlipidemia - Lipid panel: d/c statin therapy and manage lipid levels with diet if lipid panel is within normal limits. - Comprehensive metabolic panel  2. Hyperglycemia - POCT glucose (manual entry) - controlled, continue current treatment.

## 2014-09-08 NOTE — Telephone Encounter (Signed)
Spoke with patient regarding death of ex-husband.

## 2014-11-22 ENCOUNTER — Other Ambulatory Visit: Payer: Self-pay

## 2014-11-22 DIAGNOSIS — Z1231 Encounter for screening mammogram for malignant neoplasm of breast: Secondary | ICD-10-CM

## 2015-01-09 ENCOUNTER — Ambulatory Visit
Admission: RE | Admit: 2015-01-09 | Discharge: 2015-01-09 | Disposition: A | Discharge status: Home / Self Care | Payer: BLUE CROSS/BLUE SHIELD | Source: Ambulatory Visit

## 2015-01-09 DIAGNOSIS — Z1231 Encounter for screening mammogram for malignant neoplasm of breast: Secondary | ICD-10-CM

## 2015-01-26 ENCOUNTER — Ambulatory Visit (INDEPENDENT_AMBULATORY_CARE_PROVIDER_SITE_OTHER): Payer: BLUE CROSS/BLUE SHIELD | Admitting: Family Medicine

## 2015-01-26 DIAGNOSIS — Z111 Encounter for screening for respiratory tuberculosis: Secondary | ICD-10-CM

## 2015-01-28 ENCOUNTER — Ambulatory Visit (INDEPENDENT_AMBULATORY_CARE_PROVIDER_SITE_OTHER): Payer: BLUE CROSS/BLUE SHIELD | Admitting: *Deleted

## 2015-01-28 DIAGNOSIS — Z111 Encounter for screening for respiratory tuberculosis: Secondary | ICD-10-CM

## 2015-01-28 DIAGNOSIS — Z7689 Persons encountering health services in other specified circumstances: Secondary | ICD-10-CM

## 2015-01-28 LAB — TB SKIN TEST
INDURATION: 0 mm
TB Skin Test: NEGATIVE

## 2015-01-28 NOTE — Progress Notes (Signed)
   Subjective:    Patient ID: Lindsey Daniels, female    DOB: 07-Jul-1955, 59 y.o.   MRN: [id number]  HPIPatient here for ppd reading. Negative reading with 0 mm induration.     Review of Systems     Objective:   Physical Exam        Assessment & Plan:

## 2015-01-30 ENCOUNTER — Encounter: Payer: Self-pay | Admitting: Physician Assistant

## 2015-04-27 ENCOUNTER — Other Ambulatory Visit: Payer: Self-pay | Admitting: Physician Assistant

## 2015-05-29 ENCOUNTER — Other Ambulatory Visit: Payer: Self-pay | Admitting: Physician Assistant

## 2015-06-07 ENCOUNTER — Ambulatory Visit (INDEPENDENT_AMBULATORY_CARE_PROVIDER_SITE_OTHER): Payer: BLUE CROSS/BLUE SHIELD | Admitting: Physician Assistant

## 2015-06-07 ENCOUNTER — Encounter: Payer: Self-pay | Admitting: Physician Assistant

## 2015-06-07 VITALS — BP 144/80 | HR 75 | Temp 98.0°F | Ht 62.0 in | Wt 157.8 lb

## 2015-06-07 DIAGNOSIS — F329 Major depressive disorder, single episode, unspecified: Secondary | ICD-10-CM | POA: Diagnosis not present

## 2015-06-07 DIAGNOSIS — R4589 Other symptoms and signs involving emotional state: Secondary | ICD-10-CM | POA: Insufficient documentation

## 2015-06-07 DIAGNOSIS — B009 Herpesviral infection, unspecified: Secondary | ICD-10-CM | POA: Diagnosis not present

## 2015-06-07 DIAGNOSIS — Z114 Encounter for screening for human immunodeficiency virus [HIV]: Secondary | ICD-10-CM

## 2015-06-07 DIAGNOSIS — R739 Hyperglycemia, unspecified: Secondary | ICD-10-CM | POA: Diagnosis not present

## 2015-06-07 DIAGNOSIS — Z201 Contact with and (suspected) exposure to tuberculosis: Secondary | ICD-10-CM

## 2015-06-07 DIAGNOSIS — E785 Hyperlipidemia, unspecified: Secondary | ICD-10-CM | POA: Diagnosis not present

## 2015-06-07 LAB — LIPID PANEL
CHOL/HDL RATIO: 3.3 ratio (ref <=5.0)
Cholesterol: 227 mg/dL — ABNORMAL HIGH (ref 125–200)
HDL: 69 mg/dL (ref >=46)
LDL Cholesterol: 134 mg/dL — ABNORMAL HIGH (ref <130)
Triglycerides: 119 mg/dL (ref <150)
VLDL: 24 mg/dL (ref <30)

## 2015-06-07 LAB — POCT GLYCOSYLATED HEMOGLOBIN (HGB A1C): HEMOGLOBIN A1C: 5.5

## 2015-06-07 LAB — COMPREHENSIVE METABOLIC PANEL
ALT: 19 U/L (ref 6–29)
AST: 27 U/L (ref 10–35)
Albumin: 4.5 g/dL (ref 3.6–5.1)
Alkaline Phosphatase: 86 U/L (ref 33–130)
BUN: 13 mg/dL (ref 7–25)
CHLORIDE: 105 mmol/L (ref 98–110)
CO2: 22 mmol/L (ref 20–31)
Calcium: 9.4 mg/dL (ref 8.6–10.4)
Creat: 0.72 mg/dL (ref 0.50–0.99)
GLUCOSE: 95 mg/dL (ref 65–99)
POTASSIUM: 3.9 mmol/L (ref 3.5–5.3)
Sodium: 141 mmol/L (ref 135–146)
TOTAL PROTEIN: 7 g/dL (ref 6.1–8.1)
Total Bilirubin: 0.6 mg/dL (ref 0.2–1.2)

## 2015-06-07 LAB — GLUCOSE, POCT (MANUAL RESULT ENTRY): POC GLUCOSE: 95 mg/dL (ref 70–99)

## 2015-06-07 LAB — HIV ANTIBODY (ROUTINE TESTING W REFLEX): HIV: NONREACTIVE

## 2015-06-07 MED ORDER — SERTRALINE HCL 100 MG PO TABS: 100.0000 mg | ORAL_TABLET | Freq: Every day | ORAL | Status: DC

## 2015-06-07 MED ORDER — VALACYCLOVIR HCL 500 MG PO TABS: 500.0000 mg | ORAL_TABLET | Freq: Two times a day (BID) | ORAL | Status: DC

## 2015-06-07 NOTE — Patient Instructions (Signed)
     IF you received an x-ray today, you will receive an invoice from [facility]. Please contact [facility] at [phone] with questions or concerns regarding your invoice.   IF you received labwork today, you will receive an invoice from [facility]. Please contact [facility] at [phone] with questions or concerns regarding your invoice.   Our billing staff will not be able to assist you with questions regarding bills from these companies.  You will be contacted with the lab results as soon as they are available. The fastest way to get your results is to activate your My Chart account. Instructions are located on the last page of this paperwork. If you have not heard from us regarding the results in 2 weeks, please contact this office.      

## 2015-06-07 NOTE — Progress Notes (Signed)
Patient ID: Lindsey Daniels, female    DOB: [birth year], 60 y.o.   MRN: [id number]  PCP: Michiah Masse, PA-C  Subjective:  No chief complaint on file.   HPI Presents for fasting labs and medication refill.  She's been off statin therapy as she's been able to make healthy lifestyle changes.  Doing Hot Yoga. Really loving it. Still gets sore, but is feeling stronger each time.Her back never doesn't hurt, but yoga is helping.  She has had a TB exposure at work, and is currently on [facility] therapy, about halfway through the 9 month treatment course.  Review of Systems  Constitutional: Negative.   HENT: Negative.   Eyes: Negative.   Respiratory: Negative.   Cardiovascular: Negative.   Gastrointestinal: Negative.   Endocrine: Negative.   Genitourinary: Negative.   Musculoskeletal: Positive for myalgias, back pain and arthralgias. Negative for joint swelling, gait problem, neck pain and neck stiffness.  Skin: Negative.   Allergic/Immunologic: Negative.   Neurological: Negative.   Hematological: Negative.   Psychiatric/Behavioral: Negative.        Patient Active Problem List   Diagnosis Date Noted  . Carpal tunnel syndrome 03/06/2014  . Pleomorphic xanthoastrocytoma ([place]) 10/02/2013  . Syncope 08/18/2013  . Convulsions/seizures ([place]) 08/18/2013  . Bilateral leg pain 09/07/2012  . Foot pain, bilateral 09/07/2012  . Plantar fasciitis BILATERAL 08/25/2012  . Hyperlipidemia 12/24/2011     Prior to Admission medications   Medication Sig Start Date End Date Taking? Authorizing Provider  aspirin EC 81 MG tablet Take 81 mg by mouth daily.   Yes Historical Provider, MD  fluticasone (FLONASE) 50 MCG/ACT nasal spray Place 1 spray into both nostrils 2 (two) times daily as needed for allergies.    Yes Historical Provider, MD  levETIRAcetam (KEPPRA) 500 MG tablet Take 1 tablet (500 mg total) by mouth 2 (two) times daily. 08/19/13  Yes Domenic Polite, MD  meloxicam (MOBIC) 15 MG tablet Take  15 mg by mouth daily.   Yes Historical Provider, MD  Multiple Vitamin (MULTIVITAMIN WITH MINERALS) TABS tablet Take 1 tablet by mouth at bedtime.   Yes Historical Provider, MD  sertraline (ZOLOFT) 100 MG tablet TAKE ONE TABLET BY MOUTH ONE TIME DAILY  04/17/14  Yes Mischa Pollard, PA-C  valACYclovir (VALTREX) 500 MG tablet Take 500 mg by mouth 2 (two) times daily. Takes only if needed for breakouts   Yes Historical Provider, MD       Allergies  Allergen Reactions  . Rifapentine Other (See Comments)    Flu-like symptoms  . Amoxicillin Rash       Objective:  Physical Exam  Constitutional: She is oriented to person, place, and time. She appears well-developed and well-nourished. She is active and cooperative. No distress.  BP 144/80 mmHg  Pulse 75  Temp(Src) 98 F (36.7 C) (Oral)  Ht 5\' 2"  (1.575 m)  Wt 157 lb 12.8 oz (71.578 kg)  BMI 28.85 kg/m2  SpO2 98%  HENT:  Head: Normocephalic and atraumatic.  Right Ear: Hearing normal.  Left Ear: Hearing normal.  Eyes: Conjunctivae are normal. No scleral icterus.  Neck: Normal range of motion. Neck supple. No thyromegaly present.  Cardiovascular: Normal rate, regular rhythm and normal heart sounds.   Pulses:      Radial pulses are 2+ on the right side, and 2+ on the left side.  Pulmonary/Chest: Effort normal and breath sounds normal.  Lymphadenopathy:       Head (right side): No tonsillar, no preauricular, no posterior auricular and no  occipital adenopathy present.       Head (left side): No tonsillar, no preauricular, no posterior auricular and no occipital adenopathy present.    She has no cervical adenopathy.       Right: No supraclavicular adenopathy present.       Left: No supraclavicular adenopathy present.  Neurological: She is alert and oriented to person, place, and time. No sensory deficit.  Skin: Skin is warm, dry and intact. No rash noted. No cyanosis or erythema. Nails show no clubbing.  Psychiatric: She has a normal mood  and affect. Her speech is normal and behavior is normal.       Results for orders placed or performed in visit on 06/07/15  Comprehensive metabolic panel  Result Value Ref Range   Sodium 141 135 - 146 mmol/L   Potassium 3.9 3.5 - 5.3 mmol/L   Chloride 105 98 - 110 mmol/L   CO2 22 20 - 31 mmol/L   Glucose, Bld 95 65 - 99 mg/dL   BUN 13 7 - 25 mg/dL   Creat 0.72 0.50 - 0.99 mg/dL   Total Bilirubin 0.6 0.2 - 1.2 mg/dL   Alkaline Phosphatase 86 33 - 130 U/L   AST 27 10 - 35 U/L   ALT 19 6 - 29 U/L   Total Protein 7.0 6.1 - 8.1 g/dL   Albumin 4.5 3.6 - 5.1 g/dL   Calcium 9.4 8.6 - 10.4 mg/dL  HIV antibody  Result Value Ref Range   HIV 1&2 Ab, 4th Generation NONREACTIVE NONREACTIVE  Lipid panel  Result Value Ref Range   Cholesterol 227 (H) 125 - 200 mg/dL   Triglycerides 119 <150 mg/dL   HDL 69 >=46 mg/dL   Total CHOL/HDL Ratio 3.3 <=5.0 Ratio   VLDL 24 <30 mg/dL   LDL Cholesterol 134 (H) <130 mg/dL  POCT glucose (manual entry)  Result Value Ref Range   POC Glucose 95 70 - 99 mg/dl  POCT glycosylated hemoglobin (Hb A1C)  Result Value Ref Range   Hemoglobin A1C 5.5        Assessment & Plan:   1. Hyperlipidemia Stable off statins. Continue healthy lifestyle changes. - Comprehensive metabolic panel - Lipid panel  2. Hyperglycemia Resolved. - POCT glucose (manual entry) - POCT glycosylated hemoglobin (Hb A1C)  3. Screening for HIV (human immunodeficiency virus) - HIV antibody  4. Exposure to TB Continue INH therapy to completion.  5. HSV infection Stable. No recent outbreaks. - valACYclovir (VALTREX) 500 MG tablet; Take 1 tablet (500 mg total) by mouth 2 (two) times daily. Takes only if needed for breakouts  Dispense: 30 tablet; Refill: 1  6. Depressed mood Stable. - sertraline (ZOLOFT) 100 MG tablet; Take 1 tablet (100 mg total) by mouth daily.  Dispense: 90 tablet; Refill: [place], PA-C Physician Assistant-Certified [facility]

## 2015-07-09 ENCOUNTER — Encounter: Payer: BLUE CROSS/BLUE SHIELD | Admitting: Physician Assistant

## 2015-07-10 DIAGNOSIS — B351 Tinea unguium: Secondary | ICD-10-CM

## 2015-07-16 ENCOUNTER — Encounter: Payer: BLUE CROSS/BLUE SHIELD | Admitting: Physician Assistant

## 2015-11-30 ENCOUNTER — Ambulatory Visit (INDEPENDENT_AMBULATORY_CARE_PROVIDER_SITE_OTHER): Payer: BLUE CROSS/BLUE SHIELD | Admitting: Family Medicine

## 2015-11-30 ENCOUNTER — Encounter: Payer: Self-pay | Admitting: Family Medicine

## 2015-11-30 VITALS — BP 122/74 | HR 95 | Temp 97.7°F | Resp 16 | Ht 62.5 in | Wt 159.4 lb

## 2015-11-30 DIAGNOSIS — L309 Dermatitis, unspecified: Secondary | ICD-10-CM | POA: Diagnosis not present

## 2015-11-30 MED ORDER — PREDNISONE 20 MG PO TABS: ORAL_TABLET | ORAL | 0 refills | Status: DC

## 2015-11-30 NOTE — Patient Instructions (Addendum)
IF you received an x-ray today, you will receive an invoice from [facility]. Please contact [facility] at [phone] with questions or concerns regarding your invoice.   IF you received labwork today, you will receive an invoice from [employer]. Please contact [facility] at [phone] with questions or concerns regarding your invoice.   Our billing staff will not be able to assist you with questions regarding bills from these companies.  You will be contacted with the lab results as soon as they are available. The fastest way to get your results is to activate your My Chart account. Instructions are located on the last page of this paperwork. If you have not heard from [place] regarding the results in 2 weeks, please contact this office.     Contact Dermatitis Dermatitis is redness, soreness, and swelling (inflammation) of the skin. Contact dermatitis is a reaction to certain substances that touch the skin. There are two types of contact dermatitis:   Irritant contact dermatitis. This type is caused by something that irritates your skin, such as dry hands from washing them too much. This type does not require previous exposure to the substance for a reaction to occur. This type is more common.  Allergic contact dermatitis. This type is caused by a substance that you are allergic to, such as a nickel allergy or poison ivy. This type only occurs if you have been exposed to the substance (allergen) before. Upon a repeat exposure, your body reacts to the substance. This type is less common. CAUSES  Many different substances can cause contact dermatitis. Irritant contact dermatitis is most commonly caused by exposure to:   Makeup.   Soaps.   Detergents.   Bleaches.   Acids.   Metal salts, such as nickel.  Allergic contact dermatitis is most commonly caused by exposure to:   Poisonous plants.   Chemicals.   Jewelry.   Latex.    Medicines.   Preservatives in products, such as clothing.  RISK FACTORS This condition is more likely to develop in:   People who have jobs that expose them to irritants or allergens.  People who have certain medical conditions, such as asthma or eczema.  SYMPTOMS  Symptoms of this condition may occur anywhere on your body where the irritant has touched you or is touched by you. Symptoms include:  Dryness or flaking.   Redness.   Cracks.   Itching.   Pain or a burning feeling.   Blisters.  Drainage of small amounts of blood or clear fluid from skin cracks. With allergic contact dermatitis, there may also be swelling in areas such as the eyelids, mouth, or genitals.  DIAGNOSIS  This condition is diagnosed with a medical history and physical exam. A patch skin test may be performed to help determine the cause. If the condition is related to your job, you may need to see an occupational medicine specialist. TREATMENT Treatment for this condition includes figuring out what caused the reaction and protecting your skin from further contact. Treatment may also include:   Steroid creams or ointments. Oral steroid medicines may be needed in more severe cases.  Antibiotics or antibacterial ointments, if a skin infection is present.  Antihistamine lotion or an antihistamine taken by mouth to ease itching.  A bandage (dressing). HOME CARE INSTRUCTIONS Skin Care  Moisturize your skin as needed.   Apply cool compresses to the affected areas.  Try taking a bath with:  Epsom salts. Follow the instructions  on the packaging. You can get these at your local pharmacy or grocery store.  Baking soda. Pour a small amount into the bath as directed by your health care provider.  Colloidal oatmeal. Follow the instructions on the packaging. You can get this at your local pharmacy or grocery store.  Try applying baking soda paste to your skin. Stir water into baking soda until  it reaches a paste-like consistency.  Do not scratch your skin.  Bathe less frequently, such as every other day.  Bathe in lukewarm water. Avoid using hot water. Medicines  Take or apply over-the-counter and prescription medicines only as told by your health care provider.   If you were prescribed an antibiotic medicine, take or apply your antibiotic as told by your health care provider. Do not stop using the antibiotic even if your condition starts to improve. General Instructions  Keep all follow-up visits as told by your health care provider. This is important.  Avoid the substance that caused your reaction. If you do not know what caused it, keep a journal to try to track what caused it. Write down:  What you eat.  What cosmetic products you use.  What you drink.  What you wear in the affected area. This includes jewelry.  If you were given a dressing, take care of it as told by your health care provider. This includes when to change and remove it. SEEK MEDICAL CARE IF:   Your condition does not improve with treatment.  Your condition gets worse.  You have signs of infection such as swelling, tenderness, redness, soreness, or warmth in the affected area.  You have a fever.  You have new symptoms. SEEK IMMEDIATE MEDICAL CARE IF:   You have a severe headache, neck pain, or neck stiffness.  You vomit.  You feel very sleepy.  You notice red streaks coming from the affected area.  Your bone or joint underneath the affected area becomes painful after the skin has healed.  The affected area turns darker.  You have difficulty breathing.   This information is not intended to replace advice given to you by your health care provider. Make sure you discuss any questions you have with your health care provider.   Document Released: 01/31/2000 Document Revised: 10/24/2014 Document Reviewed: 06/20/2014 Elsevier Interactive Patient Education [employer].

## 2015-11-30 NOTE — Progress Notes (Signed)
Subjective:    Patient ID: Lindsey Daniels, female    DOB: [birth year], 60 y.o.   MRN: [id number]  11/30/2015  Urticaria (All over)   HPI This 60 y.o. female presents for evaluation of hives/urticaria.  Onset three weeks ago.  Itching starts when gets hot.  Got sunburned at beach several weeks ago.  S/p recent skin cancer survey. First weekend in October, was going to trim some shrubs.  Sprayed area with DEET; trimmed bushes along bank.  Does not feel anything biting pt.  Saw poison ivy in shrubs; avoided it. Started itching that night.  Convinced that got into something.  Has progressively worsened.  Has wwashes all sheets, towels.Washed linens again. No other new topical lotions, detergents, soaps.  Applying cortizone cream with temporary relief. Afraid to take anything.  Started getting better this week so did not.  Went to outpatient pharmacy; purchased Zyrtec 10mg  and benadryl cream with zinc.  Cannot wear a bra.  Wearing a wife beater.  In folds.  Lower back flared.  Nothing on legs.  Almost like a heat rash.  Uses vitamin E and coconut oil which has used forever. Takes a cold shower; takes it as cold as possible.  Denies facial swelling, tongue swelling, throat swelling, SOB.  No palm or sole involvement; no hand or feet involvement.  No mucosal membrane involvement.   Review of Systems  Constitutional: Negative for chills, diaphoresis, fatigue and fever.  HENT: Negative for facial swelling.   Eyes: Negative for visual disturbance.  Respiratory: Negative for cough and shortness of breath.   Cardiovascular: Negative for chest pain, palpitations and leg swelling.  Gastrointestinal: Negative for abdominal pain, constipation, diarrhea, nausea and vomiting.  Endocrine: Negative for cold intolerance, heat intolerance, polydipsia, polyphagia and polyuria.  Skin: Positive for color change and rash.  Neurological: Negative for dizziness, tremors, seizures, syncope, facial asymmetry, speech  difficulty, weakness, light-headedness, numbness and headaches.    Past Medical History:  Diagnosis Date  . Allergy   . Anal fissure   . Anxiety   . Cataract   . Depression   . Elevated LFTs   . Hyperglycemia   . Hyperlipidemia   . Hypertension   . Osteoarthritis   . Pars defect of lumbar spine   . Seizures ([place])   . Spondylolisthesis    lumbar   Past Surgical History:  Procedure Laterality Date  . cataract    . 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 HEEL SPUR SURGERY     Allergies  Allergen Reactions  . Rifapentine Other (See Comments)    Flu-like symptoms  . Amoxicillin Rash    Social History   Social History  . Marital status: Married    Spouse name: separated-no Research scientist (life sciences)  . Number of children: 0  . Years of education: N/A   Occupational History  . 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 Smoking status: Never Smoker  . Smokeless tobacco: Never Used  . Alcohol use 0.0 - 2.0 oz/week    0 - 4 drink(s) per week     Comment: social  . Drug use: No  . Sexual activity: Not Currently    Partners: Male   Other Topics Concern  .  Not on file   Social History Narrative   Separated from second husband.  Very contentious split.   Family History  Problem Relation Age of Onset  . Mental illness Father     depression  . COPD Father   . Alcohol abuse Brother        Objective:    BP 122/74   Pulse 95   Temp 97.7 F (36.5 C) (Oral)   Resp 16   Ht 5' 2.5" (1.588 m)   Wt 159 lb 6.4 oz (72.3 kg)   SpO2 97%   BMI 28.69 kg/m  Physical Exam  Constitutional: She is oriented to person, place, and time. She appears well-developed and well-nourished. No distress.  HENT:  Head:  Normocephalic and atraumatic.  Eyes: Conjunctivae are normal. Pupils are equal, round, and reactive to light.  Neck: Normal range of motion. Neck supple.  Cardiovascular: Normal rate, regular rhythm and normal heart sounds.  Exam reveals no gallop and no friction rub.   No murmur heard. Pulmonary/Chest: Effort normal and breath sounds normal. She has no wheezes. She has no rales.  Neurological: She is alert and oriented to person, place, and time.  Skin: Rash noted. She is not diaphoretic.  Diffuse scattered maculopapular rash along neck/hairline and extends along torso to axilla B and inguinal folds B. Also extends down B arms.  Psychiatric: She has a normal mood and affect. Her behavior is normal.  Nursing note and vitals reviewed.  Results for orders placed or performed in visit on 06/07/15  Comprehensive metabolic panel  Result Value Ref Range   Sodium 141 135 - 146 mmol/L   Potassium 3.9 3.5 - 5.3 mmol/L   Chloride 105 98 - 110 mmol/L   CO2 22 20 - 31 mmol/L   Glucose, Bld 95 65 - 99 mg/dL   BUN 13 7 - 25 mg/dL   Creat 0.72 0.50 - 0.99 mg/dL   Total Bilirubin 0.6 0.2 - 1.2 mg/dL   Alkaline Phosphatase 86 33 - 130 U/L   AST 27 10 - 35 U/L   ALT 19 6 - 29 U/L   Total Protein 7.0 6.1 - 8.1 g/dL   Albumin 4.5 3.6 - 5.1 g/dL   Calcium 9.4 8.6 - 10.4 mg/dL  HIV antibody  Result Value Ref Range   HIV 1&2 Ab, 4th Generation NONREACTIVE NONREACTIVE  Lipid panel  Result Value Ref Range   Cholesterol 227 (H) 125 - 200 mg/dL   Triglycerides 119 <150 mg/dL   HDL 69 >=46 mg/dL   Total CHOL/HDL Ratio 3.3 <=5.0 Ratio   VLDL 24 <30 mg/dL   LDL Cholesterol 134 (H) <130 mg/dL  POCT glucose (manual entry)  Result Value Ref Range   POC Glucose 95 70 - 99 mg/dl  POCT glycosylated hemoglobin (Hb A1C)  Result Value Ref Range   Hemoglobin A1C 5.5        Assessment & Plan:   1. Dermatitis    -New. -Rx for Prednisone provided.  Continue Zyrtec bid.   No orders of the defined types  were placed in this encounter.  Meds ordered this encounter  Medications  . predniSONE (DELTASONE) 20 MG tablet    Sig: Take 3 PO QAM x 1 day, 2 PO QAM x 5 days, 1 PO QAM x 5 days    Dispense:  18 tablet    Refill:  0    No Follow-up on file.   Kristi Elayne Guerin, M.D. [facility]  [address], [place]  [address] [phone] phone [phone] fax

## 2015-12-23 ENCOUNTER — Other Ambulatory Visit: Payer: Self-pay | Admitting: Physician Assistant

## 2015-12-23 DIAGNOSIS — Z1231 Encounter for screening mammogram for malignant neoplasm of breast: Secondary | ICD-10-CM

## 2016-01-29 ENCOUNTER — Ambulatory Visit
Admission: RE | Admit: 2016-01-29 | Discharge: 2016-01-29 | Disposition: A | Discharge status: Home / Self Care | Payer: BLUE CROSS/BLUE SHIELD | Source: Ambulatory Visit | Attending: Physician Assistant | Admitting: Physician Assistant

## 2016-01-29 DIAGNOSIS — Z1231 Encounter for screening mammogram for malignant neoplasm of breast: Secondary | ICD-10-CM

## 2016-02-03 ENCOUNTER — Ambulatory Visit (INDEPENDENT_AMBULATORY_CARE_PROVIDER_SITE_OTHER): Payer: BLUE CROSS/BLUE SHIELD | Admitting: Physician Assistant

## 2016-02-03 VITALS — BP 134/80 | HR 80 | Temp 98.4°F | Resp 18 | Ht 62.5 in | Wt 165.0 lb

## 2016-02-03 DIAGNOSIS — M5412 Radiculopathy, cervical region: Secondary | ICD-10-CM

## 2016-02-03 DIAGNOSIS — M25512 Pain in left shoulder: Secondary | ICD-10-CM | POA: Diagnosis not present

## 2016-02-03 MED ORDER — CYCLOBENZAPRINE HCL 10 MG PO TABS: 10.0000 mg | ORAL_TABLET | Freq: Every day | ORAL | 0 refills | Status: DC

## 2016-02-03 NOTE — Progress Notes (Signed)
Patient ID: Lindsey Daniels, female    DOB: 11/26/1955, 60 y.o.   MRN: [id number]  PCP: Harrison Mons, PA-C  Chief Complaint  Patient presents with  . Neck Pain  . Shoulder Pain    Subjective:   Presents for evaluation of neck and shoulder pain.  Both of these are chronic problems beginning in 2010 following doing some yardwork, with large shears overhead. The next day she was sore all over, and then gradually improved and resolved, except in the LEFT shoulder. Radiographs of the shoulder 08/2008: No acute or subacute skeletal abnormalities.  Probable geode (subchondral cyst) in the glenoid. No evidence of acromioclavicular separation.  No significant degenerative change within either acromioclavicular joint.  Visualized sternoclavicular joints intact. Saw Dr. Rip Harbour.  Has done PT, used meloxicam  Bilateral neck down into the upper back, under the shoulder blades. 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 Got worse last week. Feels grinding, like something is loose in the LEFT shoulder.  Had a professional massage last week, "worked out a few knots"  Today she also has pain in the LEFT axilla, radiates down the outer arm through the elbow to her hand     Review of Systems As above.    Patient Active Problem List   Diagnosis Date Noted  . Exposure to TB 06/07/2015  . 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 Depressed mood 06/07/2015  . Carpal tunnel syndrome 03/06/2014  . 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 Syncope 08/18/2013  . Convulsions/seizures ([place]) 08/18/2013  . Bilateral leg pain 09/07/2012  . Foot pain, bilateral 09/07/2012  . Plantar fasciitis BILATERAL 08/25/2012  . Hyperlipidemia 12/24/2011     Prior to Admission medications   Medication Sig Start Date End Date Taking? Authorizing Provider  aspirin EC 81 MG tablet Take 81 mg by mouth daily.   Yes Historical  Provider, MD  fluticasone (FLONASE) 50 MCG/ACT nasal spray Place 1 spray into both nostrils 2 (two) times daily as needed for allergies.    Yes Historical Provider, MD  levETIRAcetam (KEPPRA) 500 MG tablet Take 1 tablet (500 mg total) by mouth 2 (two) times daily. 08/19/13  Yes Domenic Polite, MD  meloxicam (MOBIC) 15 MG tablet Take 15 mg by mouth daily.   Yes Historical Provider, MD  Multiple Vitamin (MULTIVITAMIN WITH MINERALS) TABS tablet Take 1 tablet by mouth at bedtime.   Yes Historical Provider, MD  sertraline (ZOLOFT) 100 MG tablet Take 1 tablet (100 mg total) by mouth daily. 06/07/15  Yes Saroya Riccobono, PA-C  valACYclovir (VALTREX) 500 MG tablet Take 1 tablet (500 mg total) by mouth 2 (two) times daily. Takes only if needed for breakouts 06/07/15  Yes Nicklos Gaxiola, PA-C     Allergies  Allergen Reactions  . Rifapentine Other (See Comments)    Flu-like symptoms  . Amoxicillin Rash       Objective:  Physical Exam  Constitutional: She is oriented to person, place, and time. She appears well-developed and well-nourished. She is active and cooperative. No distress.  BP 134/80 (BP Location: Right Arm, Patient Position: Sitting, Cuff Size: Small)   Pulse 80   Temp 98.4 F (36.9 C) (Oral)   Resp 18   Ht 5' 2.5" (1.588 m)   Wt 165 lb (74.8 kg)   SpO2 96%   BMI 29.70 kg/m    HENT:  Head: Normocephalic and atraumatic.  Eyes: Conjunctivae are normal. No scleral icterus.  Neck: Normal range of motion. Neck supple. No thyromegaly present.  Cardiovascular: Normal rate, regular rhythm and normal heart sounds.   Pulmonary/Chest: Effort normal and breath sounds normal.  Musculoskeletal:       Right shoulder: Normal.       Left shoulder: She exhibits pain. She exhibits normal range of motion (but with pain on AROM and PROM), no tenderness, no bony tenderness, no swelling, no effusion, no crepitus, no deformity, no laceration, no spasm, normal pulse and normal strength.       Right elbow:  Normal.      Right wrist: Normal.       Cervical back: She exhibits pain. She exhibits normal range of motion, no tenderness, no bony tenderness, no swelling, no edema, no deformity, no laceration, no spasm and normal pulse.       Right upper arm: Normal.       Left upper arm: Normal.       Right forearm: Normal.       Left forearm: Normal.       Right hand: Normal.       Left hand: Normal.  Lymphadenopathy:    She has no cervical adenopathy.  Neurological: She is alert and oriented to person, place, and time. She has normal strength. No cranial nerve deficit or sensory deficit.  Skin: Skin is warm and dry.  Psychiatric: She has a normal mood and affect. Her speech is normal and behavior is normal.           Assessment & Plan:   1. Pain in joint of left shoulder 2. Cervical radiculopathy, chronic Resume meloxicam. Add cyclobenzaprine at HS. Re-evaluate with ortho. May benefit from additional imaging, injection. - cyclobenzaprine (FLEXERIL) 10 MG tablet; Take 1 tablet (10 mg total) by mouth at bedtime.  Dispense: 30 tablet; Refill: 0 - Ambulatory referral to [place], PA-C Physician Assistant-Certified [facility]

## 2016-02-03 NOTE — Patient Instructions (Addendum)
  Take the meloxicam daily until you see Dr. Rip Harbour. Use the cyclobenzaprine at bedtime.   IF you received an x-ray today, you will receive an invoice from [facility]. Please contact [facility] at [phone] with questions or concerns regarding your invoice.   IF you received labwork today, you will receive an invoice from [facility]. Please contact [facility] at [phone] with questions or concerns regarding your invoice.   Our billing staff will not be able to assist you with questions regarding bills from these companies.  You will be contacted with the lab results as soon as they are available. The fastest way to get your results is to activate your My Chart account. Instructions are located on the last page of this paperwork. If you have not heard from [place] regarding the results in 2 weeks, please contact this office.

## 2016-02-12 DIAGNOSIS — R52 Pain, unspecified: Secondary | ICD-10-CM

## 2016-02-29 ENCOUNTER — Other Ambulatory Visit: Payer: Self-pay | Admitting: Physician Assistant

## 2016-02-29 DIAGNOSIS — M5412 Radiculopathy, cervical region: Secondary | ICD-10-CM

## 2016-03-01 NOTE — Telephone Encounter (Signed)
02/03/16 last ov and rx

## 2016-03-02 NOTE — Telephone Encounter (Signed)
Meds ordered this encounter  Medications  . cyclobenzaprine (FLEXERIL) 10 MG tablet    Sig: TAKE 1 TABLET (10 MG TOTAL) BY MOUTH AT BEDTIME.    Dispense:  30 tablet    Refill:  0

## 2016-07-18 ENCOUNTER — Other Ambulatory Visit: Payer: Self-pay | Admitting: Physician Assistant

## 2016-07-18 DIAGNOSIS — F329 Major depressive disorder, single episode, unspecified: Principal | ICD-10-CM

## 2016-07-18 DIAGNOSIS — R4589 Other symptoms and signs involving emotional state: Secondary | ICD-10-CM

## 2016-08-19 ENCOUNTER — Other Ambulatory Visit: Payer: Self-pay | Admitting: Physician Assistant

## 2016-08-19 DIAGNOSIS — R4589 Other symptoms and signs involving emotional state: Secondary | ICD-10-CM

## 2016-08-19 DIAGNOSIS — F329 Major depressive disorder, single episode, unspecified: Principal | ICD-10-CM

## 2016-09-01 DIAGNOSIS — Z5181 Encounter for therapeutic drug level monitoring: Secondary | ICD-10-CM | POA: Insufficient documentation

## 2016-09-20 ENCOUNTER — Other Ambulatory Visit: Payer: Self-pay | Admitting: Physician Assistant

## 2016-09-20 DIAGNOSIS — F329 Major depressive disorder, single episode, unspecified: Principal | ICD-10-CM

## 2016-09-20 DIAGNOSIS — R4589 Other symptoms and signs involving emotional state: Secondary | ICD-10-CM

## 2016-11-21 ENCOUNTER — Encounter: Payer: Self-pay | Admitting: Physician Assistant

## 2016-11-21 ENCOUNTER — Ambulatory Visit (INDEPENDENT_AMBULATORY_CARE_PROVIDER_SITE_OTHER): Payer: BLUE CROSS/BLUE SHIELD | Admitting: Physician Assistant

## 2016-11-21 VITALS — BP 134/74 | HR 82 | Temp 97.8°F | Resp 16 | Ht 61.42 in | Wt 166.0 lb

## 2016-11-21 DIAGNOSIS — R030 Elevated blood-pressure reading, without diagnosis of hypertension: Secondary | ICD-10-CM | POA: Diagnosis not present

## 2016-11-21 DIAGNOSIS — R29890 Loss of height: Secondary | ICD-10-CM | POA: Diagnosis not present

## 2016-11-21 DIAGNOSIS — F329 Major depressive disorder, single episode, unspecified: Secondary | ICD-10-CM | POA: Diagnosis not present

## 2016-11-21 DIAGNOSIS — R4589 Other symptoms and signs involving emotional state: Secondary | ICD-10-CM

## 2016-11-21 DIAGNOSIS — J302 Other seasonal allergic rhinitis: Secondary | ICD-10-CM | POA: Insufficient documentation

## 2016-11-21 DIAGNOSIS — E785 Hyperlipidemia, unspecified: Secondary | ICD-10-CM

## 2016-11-21 NOTE — Assessment & Plan Note (Signed)
Repeat BP is normal (134/74).

## 2016-11-21 NOTE — Progress Notes (Signed)
Patient ID: Lindsey Daniels, female    DOB: [birth year], 61 y.o.   MRN: [id number]  PCP: Harrison Mons, PA-C  Chief Complaint  Patient presents with  . Hypertension    follow-up  . Hyperlipidemia  . Depression    Subjective:   Presents for evaluation of hyperlipidemia and depression.  Her blood pressure is up a bit today as well.  She's doing really well. Continues to see a Social worker. Tolerating her medications without adverse effects.  When her vital signs were checked today, she noticed that she was shorter than she recalled. History of traumatic compression fracture. Mother had osteoporosis. Takes vitamin D, but not calcium.    Review of Systems  Constitutional: Negative.   HENT: Negative for sore throat.   Eyes: Negative for visual disturbance.  Respiratory: Negative for cough, chest tightness, shortness of breath and wheezing.   Cardiovascular: Negative for chest pain and palpitations.  Gastrointestinal: Negative for abdominal pain, diarrhea, nausea and vomiting.  Genitourinary: Negative for dysuria, frequency, hematuria and urgency.  Musculoskeletal: Negative for arthralgias and myalgias.  Skin: Negative for rash.  Neurological: Negative for dizziness, weakness and headaches.  Psychiatric/Behavioral: Negative for decreased concentration. The patient is not nervous/anxious.      Depression screen [facility] 2/9 11/21/2016 02/03/2016 11/30/2015 09/04/2014 03/06/2014  Decreased Interest 0 0 0 0 0  Down, Depressed, Hopeless 0 0 0 0 0  PHQ - 2 Score 0 0 0 0 0     Patient Active Problem List   Diagnosis Date Noted  . Seasonal allergies 11/21/2016  . Exposure to TB 06/07/2015  . 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 Depressed mood 06/07/2015  . Carpal tunnel syndrome 03/06/2014  . 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 Syncope 08/18/2013  . Convulsions/seizures ([place]) 08/18/2013  . Bilateral leg pain 09/07/2012  . Foot pain, bilateral 09/07/2012  . Plantar  fasciitis BILATERAL 08/25/2012  . Hyperlipidemia 12/24/2011     Prior to Admission medications   Medication Sig Start Date End Date Taking? Authorizing Provider  aspirin EC 81 MG tablet Take 81 mg by mouth daily.   Yes [provider]  cholecalciferol (VITAMIN D) 1000 units tablet Take 1,000 Units by mouth daily.   Yes [provider]  cyclobenzaprine (FLEXERIL) 10 MG tablet TAKE 1 TABLET (10 MG TOTAL) BY MOUTH AT BEDTIME. 03/02/16  Yes Suvan Stcyr, PA-C  fluticasone (FLONASE) 50 MCG/ACT nasal spray Place 1 spray into both nostrils 2 (two) times daily as needed for allergies.    Yes [provider]  levETIRAcetam (KEPPRA) 500 MG tablet Take 1 tablet (500 mg total) by mouth 2 (two) times daily. 08/19/13  Yes Domenic Polite, MD  meloxicam (MOBIC) 15 MG tablet Take 15 mg by mouth daily.   Yes [provider]  Multiple Vitamin (MULTIVITAMIN WITH MINERALS) TABS tablet Take 1 tablet by mouth at bedtime.   Yes [provider]  sertraline (ZOLOFT) 100 MG tablet TAKE 1 TABLET BY MOUTH EVERY DAY 09/21/16  Yes Isley Weisheit, PA-C  valACYclovir (VALTREX) 500 MG tablet Take 1 tablet (500 mg total) by mouth 2 (two) times daily. Takes only if needed for breakouts 06/07/15  Yes Jacqulynn Cadet, Kenneth Cuaresma, PA-C     Allergies  Allergen Reactions  . Rifapentine Other (See Comments)    Flu-like symptoms  . Amoxicillin Rash       Objective:  Physical Exam  Constitutional: She is oriented to person, place, and time. She appears well-developed and well-nourished. She is active and cooperative. No  distress.  BP (!) 142/72   Pulse 82   Temp 97.8 F (36.6 C) (Oral)   Resp 16   Ht 5' 1.42" (1.56 m)   Wt 166 lb (75.3 kg)   SpO2 95%   BMI 30.94 kg/m   HENT:  Head: Normocephalic and atraumatic.  Right Ear: Hearing normal.  Left Ear: Hearing normal.  Eyes: Conjunctivae are normal. No scleral icterus.  Neck: Normal range of motion. Neck supple. No thyromegaly present.    Cardiovascular: Normal rate, regular rhythm and normal heart sounds.   Pulses:      Radial pulses are 2+ on the right side, and 2+ on the left side.  Pulmonary/Chest: Effort normal and breath sounds normal.  Lymphadenopathy:       Head (right side): No tonsillar, no preauricular, no posterior auricular and no occipital adenopathy present.       Head (left side): No tonsillar, no preauricular, no posterior auricular and no occipital adenopathy present.    She has no cervical adenopathy.       Right: No supraclavicular adenopathy present.       Left: No supraclavicular adenopathy present.  Neurological: She is alert and oriented to person, place, and time. No sensory deficit.  Skin: Skin is warm, dry and intact. No rash noted. No cyanosis or erythema. Nails show no clubbing.  Psychiatric: She has a normal mood and affect. Her speech is normal and behavior is normal.    Wt Readings from Last 3 Encounters:  11/21/16 166 lb (75.3 kg)  02/03/16 165 lb (74.8 kg)  11/30/15 159 lb 6.4 oz (72.3 kg)    Ht Readings from Last 3 Encounters:  11/21/16 5' 1.42" (1.56 m)  02/03/16 5' 2.5" (1.588 m)  11/30/15 5' 2.5" (1.588 m)  review of previous record reveals 5' 1.5"-5' 2.5" going back to 2014.     Assessment & Plan:   Problem List Items Addressed This Visit    Hyperlipidemia - Primary (Chronic)    No longer on statin. Continue lifestyle changes. Await labs. Adjust regimen as indicated by results.      Relevant Orders   Comprehensive metabolic panel   Lipid panel   Depressed mood    Well controlled on sertraline 100 mg daily.      Loss of height    Following traumatic compression fracture. Taking Vitamin D supplement. Add calcium supplement.       Elevated BP without diagnosis of hypertension    Repeat BP is normal (134/74).      Relevant Orders   Care order/instruction: (Completed)       Return in about 6 months (around 05/22/2017) for re-evalaution and fasting  labs.   Fara Chute, PA-C [facility]

## 2016-11-21 NOTE — Assessment & Plan Note (Signed)
Following traumatic compression fracture. Taking Vitamin D supplement. Add calcium supplement.

## 2016-11-21 NOTE — Assessment & Plan Note (Signed)
Well controlled on sertraline 100 mg daily.

## 2016-11-21 NOTE — Assessment & Plan Note (Signed)
No longer on statin. Continue lifestyle changes. Await labs. Adjust regimen as indicated by results.

## 2016-11-21 NOTE — Patient Instructions (Signed)
     IF you received an x-ray today, you will receive an invoice from [facility]. Please contact  Radiology at [phone] with questions or concerns regarding your invoice.   IF you received labwork today, you will receive an invoice from [facility]. Please contact [facility] at [phone] with questions or concerns regarding your invoice.   Our billing staff will not be able to assist you with questions regarding bills from these companies.  You will be contacted with the lab results as soon as they are available. The fastest way to get your results is to activate your My Chart account. Instructions are located on the last page of this paperwork. If you have not heard from us regarding the results in 2 weeks, please contact this office.     

## 2016-11-22 LAB — LIPID PANEL
CHOL/HDL RATIO: 4.1 ratio (ref 0.0–4.4)
Cholesterol, Total: 245 mg/dL — ABNORMAL HIGH (ref 100–199)
HDL: 60 mg/dL (ref >39)
LDL CALC: 146 mg/dL — AB (ref 0–99)
TRIGLYCERIDES: 197 mg/dL — AB (ref 0–149)
VLDL Cholesterol Cal: 39 mg/dL (ref 5–40)

## 2016-11-22 LAB — COMPREHENSIVE METABOLIC PANEL
ALT: 28 IU/L (ref 0–32)
AST: 32 IU/L (ref 0–40)
Albumin/Globulin Ratio: 2 (ref 1.2–2.2)
Albumin: 4.8 g/dL (ref 3.6–4.8)
Alkaline Phosphatase: 105 IU/L (ref 39–117)
BUN/Creatinine Ratio: 18 (ref 12–28)
BUN: 12 mg/dL (ref 8–27)
Bilirubin Total: 0.7 mg/dL (ref 0.0–1.2)
CALCIUM: 9.9 mg/dL (ref 8.7–10.3)
CO2: 24 mmol/L (ref 20–29)
CREATININE: 0.65 mg/dL (ref 0.57–1.00)
Chloride: 101 mmol/L (ref 96–106)
GFR, EST AFRICAN AMERICAN: 110 mL/min/{1.73_m2} (ref >59)
GFR, EST NON AFRICAN AMERICAN: 95 mL/min/{1.73_m2} (ref >59)
Globulin, Total: 2.4 g/dL (ref 1.5–4.5)
Glucose: 105 mg/dL — ABNORMAL HIGH (ref 65–99)
Potassium: 4.2 mmol/L (ref 3.5–5.2)
SODIUM: 140 mmol/L (ref 134–144)
Total Protein: 7.2 g/dL (ref 6.0–8.5)

## 2016-12-01 ENCOUNTER — Encounter: Payer: Self-pay | Admitting: Physician Assistant

## 2017-01-04 ENCOUNTER — Other Ambulatory Visit: Payer: Self-pay | Admitting: Physician Assistant

## 2017-01-04 DIAGNOSIS — Z139 Encounter for screening, unspecified: Secondary | ICD-10-CM

## 2017-02-02 LAB — HM PAP SMEAR: HM Pap smear: NEGATIVE

## 2017-02-03 ENCOUNTER — Ambulatory Visit
Admission: RE | Admit: 2017-02-03 | Discharge: 2017-02-03 | Disposition: A | Discharge status: Home / Self Care | Payer: BLUE CROSS/BLUE SHIELD | Source: Ambulatory Visit | Attending: Physician Assistant | Admitting: Physician Assistant

## 2017-02-03 DIAGNOSIS — Z139 Encounter for screening, unspecified: Secondary | ICD-10-CM

## 2017-05-24 ENCOUNTER — Encounter: Payer: Self-pay | Admitting: Physician Assistant

## 2017-06-30 ENCOUNTER — Ambulatory Visit (INDEPENDENT_AMBULATORY_CARE_PROVIDER_SITE_OTHER): Payer: BLUE CROSS/BLUE SHIELD | Admitting: Physician Assistant

## 2017-06-30 ENCOUNTER — Encounter: Payer: Self-pay | Admitting: Physician Assistant

## 2017-06-30 ENCOUNTER — Other Ambulatory Visit: Payer: Self-pay

## 2017-06-30 VITALS — BP 118/76 | HR 85 | Temp 98.1°F | Resp 16 | Ht 61.42 in | Wt 157.4 lb

## 2017-06-30 DIAGNOSIS — F329 Major depressive disorder, single episode, unspecified: Secondary | ICD-10-CM

## 2017-06-30 DIAGNOSIS — R739 Hyperglycemia, unspecified: Secondary | ICD-10-CM

## 2017-06-30 DIAGNOSIS — R030 Elevated blood-pressure reading, without diagnosis of hypertension: Secondary | ICD-10-CM

## 2017-06-30 DIAGNOSIS — E785 Hyperlipidemia, unspecified: Secondary | ICD-10-CM

## 2017-06-30 DIAGNOSIS — B009 Herpesviral infection, unspecified: Secondary | ICD-10-CM

## 2017-06-30 DIAGNOSIS — R4589 Other symptoms and signs involving emotional state: Secondary | ICD-10-CM

## 2017-06-30 MED ORDER — SERTRALINE HCL 100 MG PO TABS: 100.0000 mg | ORAL_TABLET | Freq: Every day | ORAL | 3 refills | Status: DC

## 2017-06-30 MED ORDER — VALACYCLOVIR HCL 500 MG PO TABS: 500.0000 mg | ORAL_TABLET | Freq: Two times a day (BID) | ORAL | 99 refills | Status: DC

## 2017-06-30 NOTE — Assessment & Plan Note (Signed)
Episodic treatment.

## 2017-06-30 NOTE — Assessment & Plan Note (Signed)
Continue healthy lifestyle changes. Working to stay off statin.

## 2017-06-30 NOTE — Assessment & Plan Note (Signed)
Well controlled. Continue current treatment. 

## 2017-06-30 NOTE — Patient Instructions (Addendum)
If you choose to follow me, Go ahead and call [facility] to schedule your next visit with me there. [phone].  If you choose to stay here, I recommend [employer], PA-C or [place], PA-C.    IF you received an x-ray today, you will receive an invoice from [facility]. Please contact [facility] at [phone] with questions or concerns regarding your invoice.   IF you received labwork today, you will receive an invoice from [facility]. Please contact [facility] at [phone] with questions or concerns regarding your invoice.   Our billing staff will not be able to assist you with questions regarding bills from these companies.  You will be contacted with the lab results as soon as they are available. The fastest way to get your results is to activate your My Chart account. Instructions are located on the last page of this paperwork. If you have not heard from [place] regarding the results in 2 weeks, please contact this office.

## 2017-06-30 NOTE — Assessment & Plan Note (Signed)
Excellent BP. No indication for medication. Continue healthy lifestyle changes.

## 2017-06-30 NOTE — Progress Notes (Signed)
Patient ID: Lindsey Daniels, female    DOB: 29-Mar-1955, 62 y.o.   MRN: [id number]  PCP: Harrison Mons, PA-C  Chief Complaint  Patient presents with  . Medication Refill    sertraline, valtrex , labs     Subjective:   Presents for evaluation of hyperlipidemia.  She also needs medication refills.  Recall that she was previously on statin therapy.  With healthy lifestyle changes, it was discontinued, and she was able to maintain adequate control with healthy eating and regular exercise.. Had lost additional 6 lbs since her last visit. Stressful spring. Skips lunch due to busy schedule, then makes poor choices.  Otherwise, she reports doing well.   Review of Systems No chest pain, shortness of breath, blurred vision, dizziness. No visual changes.  No increased headaches. No nausea, vomiting, diarrhea, obstipation. No urinary symptoms.  No new or unexplained muscle or joint pain.  No unexplained rash.    Patient Active Problem List   Diagnosis Date Noted  . Seasonal allergies 11/21/2016  . Loss of height 11/21/2016  . Elevated BP without diagnosis of hypertension 11/21/2016  . Exposure to TB 06/07/2015  . 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 Depressed mood 06/07/2015  . Carpal tunnel syndrome 03/06/2014  . 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 Syncope 08/18/2013  . Convulsions/seizures ([place]) 08/18/2013  . Bilateral leg pain 09/07/2012  . Foot pain, bilateral 09/07/2012  . Plantar fasciitis BILATERAL 08/25/2012  . Hyperlipidemia 12/24/2011     Prior to Admission medications   Medication Sig Start Date End Date Taking? Authorizing Provider  cholecalciferol (VITAMIN D) 1000 units tablet Take 1,000 Units by mouth daily.   Yes [provider]  fluticasone (FLONASE) 50 MCG/ACT nasal spray Place 1 spray into both nostrils 2 (two) times daily as needed for allergies.    Yes [provider]  levETIRAcetam (KEPPRA) 500 MG tablet Take 1 tablet (500 mg  total) by mouth 2 (two) times daily. 08/19/13  Yes Domenic Polite, MD  meloxicam (MOBIC) 15 MG tablet Take 15 mg by mouth daily.   Yes [provider]  Multiple Vitamin (MULTIVITAMIN WITH MINERALS) TABS tablet Take 1 tablet by mouth at bedtime.   Yes [provider]  sertraline (ZOLOFT) 100 MG tablet TAKE 1 TABLET BY MOUTH EVERY DAY 09/21/16  Yes Isidra Mings, PA-C  valACYclovir (VALTREX) 500 MG tablet Take 1 tablet (500 mg total) by mouth 2 (two) times daily. Takes only if needed for breakouts 06/07/15  Yes Harrison Mons, PA-C  aspirin EC 81 MG tablet Take 81 mg by mouth daily.    [provider]  cyclobenzaprine (FLEXERIL) 10 MG tablet TAKE 1 TABLET (10 MG TOTAL) BY MOUTH AT BEDTIME. Patient not taking: Reported on 06/30/2017 03/02/16   Harrison Mons, PA-C     Allergies  Allergen Reactions  . Rifapentine Other (See Comments)    Flu-like symptoms  . Amoxicillin Rash       Objective:  Physical Exam  Constitutional: She is oriented to person, place, and time. She appears well-developed and well-nourished. She is active and cooperative. No distress.  BP 118/76   Pulse 85   Temp 98.1 F (36.7 C)   Resp 16   Ht 5' 1.42" (1.56 m)   Wt 157 lb 6.4 oz (71.4 kg)   SpO2 94%   BMI 29.34 kg/m    HENT:  Head: Normocephalic and atraumatic.  Right Ear: Hearing normal.  Left Ear: Hearing normal.  Eyes: Conjunctivae are normal. No scleral  icterus.  Neck: Normal range of motion. Neck supple. No thyromegaly present.  Cardiovascular: Normal rate, regular rhythm and normal heart sounds.  Pulses:      Radial pulses are 2+ on the right side, and 2+ on the left side.  Pulmonary/Chest: Effort normal and breath sounds normal.  Lymphadenopathy:       Head (right side): No tonsillar, no preauricular, no posterior auricular and no occipital adenopathy present.       Head (left side): No tonsillar, no preauricular, no posterior auricular and no occipital adenopathy present.     She has no cervical adenopathy.       Right: No supraclavicular adenopathy present.       Left: No supraclavicular adenopathy present.  Neurological: She is alert and oriented to person, place, and time. No sensory deficit.  Skin: Skin is warm, dry and intact. No rash noted. No cyanosis or erythema. Nails show no clubbing.  Psychiatric: She has a normal mood and affect. Her speech is normal and behavior is normal.     BP Readings from Last 3 Encounters:  06/30/17 118/76  11/21/16 134/74  02/03/16 134/80   Wt Readings from Last 3 Encounters:  06/30/17 157 lb 6.4 oz (71.4 kg)  11/21/16 166 lb (75.3 kg)  02/03/16 165 lb (74.8 kg)       Assessment & Plan:   Problem List Items Addressed This Visit    Hyperlipidemia - Primary (Chronic)    Continue healthy lifestyle changes. Working to stay off statin.      Relevant Orders   Comprehensive metabolic panel   Lipid panel   HSV infection    Episodic treatment.      Relevant Medications   valACYclovir (VALTREX) 500 MG tablet   Depressed mood    Well controlled. Continue current treatment.      Relevant Medications   sertraline (ZOLOFT) 100 MG tablet   Other Relevant Orders   TSH   Elevated BP without diagnosis of hypertension    Excellent BP. No indication for medication. Continue healthy lifestyle changes.      Relevant Orders   CBC with Differential/Platelet   Comprehensive metabolic panel   TSH    Other Visit Diagnoses    Hyperglycemia       Relevant Orders   Comprehensive metabolic panel   Hemoglobin A1c       Return in about 6 months (around 12/31/2017) for re-evaluation of cholesterol, mood.   Fara Chute, PA-C [facility]

## 2017-07-02 LAB — TSH: TSH: 2.85 u[IU]/mL (ref 0.450–4.500)

## 2017-07-02 LAB — COMPREHENSIVE METABOLIC PANEL
ALBUMIN: 4.6 g/dL (ref 3.6–4.8)
ALT: 17 IU/L (ref 0–32)
AST: 22 IU/L (ref 0–40)
Albumin/Globulin Ratio: 1.9 (ref 1.2–2.2)
Alkaline Phosphatase: 89 IU/L (ref 39–117)
BUN / CREAT RATIO: 21 (ref 12–28)
BUN: 14 mg/dL (ref 8–27)
Bilirubin Total: 0.6 mg/dL (ref 0.0–1.2)
CALCIUM: 9.6 mg/dL (ref 8.7–10.3)
CO2: 22 mmol/L (ref 20–29)
CREATININE: 0.67 mg/dL (ref 0.57–1.00)
Chloride: 102 mmol/L (ref 96–106)
GFR, EST AFRICAN AMERICAN: 108 mL/min/{1.73_m2} (ref >59)
GFR, EST NON AFRICAN AMERICAN: 94 mL/min/{1.73_m2} (ref >59)
Globulin, Total: 2.4 g/dL (ref 1.5–4.5)
Glucose: 89 mg/dL (ref 65–99)
Potassium: 3.8 mmol/L (ref 3.5–5.2)
Sodium: 142 mmol/L (ref 134–144)
Total Protein: 7 g/dL (ref 6.0–8.5)

## 2017-07-02 LAB — CBC WITH DIFFERENTIAL/PLATELET
BASOS: 0 %
Basophils Absolute: 0 10*3/uL (ref 0.0–0.2)
EOS (ABSOLUTE): 0.2 10*3/uL (ref 0.0–0.4)
Eos: 3 %
HEMATOCRIT: 38.6 % (ref 34.0–46.6)
HEMOGLOBIN: 13 g/dL (ref 11.1–15.9)
IMMATURE GRANULOCYTES: 0 %
Immature Grans (Abs): 0 10*3/uL (ref 0.0–0.1)
LYMPHS ABS: 2 10*3/uL (ref 0.7–3.1)
Lymphs: 30 %
MCH: 29.2 pg (ref 26.6–33.0)
MCHC: 33.7 g/dL (ref 31.5–35.7)
MCV: 87 fL (ref 79–97)
MONOCYTES: 9 %
Monocytes Absolute: 0.6 10*3/uL (ref 0.1–0.9)
NEUTROS PCT: 58 %
Neutrophils Absolute: 3.8 10*3/uL (ref 1.4–7.0)
Platelets: 249 10*3/uL (ref 150–379)
RBC: 4.45 x10E6/uL (ref 3.77–5.28)
RDW: 14 % (ref 12.3–15.4)
WBC: 6.6 10*3/uL (ref 3.4–10.8)

## 2017-07-02 LAB — LIPID PANEL
CHOLESTEROL TOTAL: 265 mg/dL — AB (ref 100–199)
Chol/HDL Ratio: 4.3 ratio (ref 0.0–4.4)
HDL: 62 mg/dL (ref >39)
LDL Calculated: 177 mg/dL — ABNORMAL HIGH (ref 0–99)
TRIGLYCERIDES: 129 mg/dL (ref 0–149)
VLDL CHOLESTEROL CAL: 26 mg/dL (ref 5–40)

## 2017-07-02 LAB — HEMOGLOBIN A1C
ESTIMATED AVERAGE GLUCOSE: 108 mg/dL
Hgb A1c MFr Bld: 5.4 % (ref 4.8–5.6)

## 2017-07-13 ENCOUNTER — Encounter: Payer: Self-pay | Admitting: Family Medicine

## 2017-07-25 MED ORDER — ATORVASTATIN CALCIUM 20 MG PO TABS: 20.0000 mg | ORAL_TABLET | Freq: Every day | ORAL | 3 refills | Status: DC

## 2017-07-25 NOTE — Addendum Note (Signed)
Addended by: Fara Chute on: 07/25/2017 04:33 PM   Modules accepted: Orders

## 2018-01-07 ENCOUNTER — Other Ambulatory Visit: Payer: Self-pay | Admitting: Physician Assistant

## 2018-01-07 DIAGNOSIS — Z1231 Encounter for screening mammogram for malignant neoplasm of breast: Secondary | ICD-10-CM

## 2018-02-11 ENCOUNTER — Ambulatory Visit
Admission: RE | Admit: 2018-02-11 | Discharge: 2018-02-11 | Disposition: A | Discharge status: Home / Self Care | Payer: BLUE CROSS/BLUE SHIELD | Source: Ambulatory Visit | Attending: Physician Assistant | Admitting: Physician Assistant

## 2018-02-11 DIAGNOSIS — Z1231 Encounter for screening mammogram for malignant neoplasm of breast: Secondary | ICD-10-CM

## 2018-03-03 ENCOUNTER — Other Ambulatory Visit: Payer: Self-pay | Admitting: Obstetrics and Gynecology

## 2018-03-03 DIAGNOSIS — R5381 Other malaise: Secondary | ICD-10-CM

## 2018-03-07 ENCOUNTER — Other Ambulatory Visit: Payer: Self-pay | Admitting: Obstetrics and Gynecology

## 2018-03-07 DIAGNOSIS — E2839 Other primary ovarian failure: Secondary | ICD-10-CM

## 2018-05-02 ENCOUNTER — Ambulatory Visit
Admission: RE | Admit: 2018-05-02 | Discharge: 2018-05-02 | Disposition: A | Discharge status: Home / Self Care | Payer: BC Managed Care – PPO | Source: Ambulatory Visit | Attending: Obstetrics and Gynecology | Admitting: Obstetrics and Gynecology

## 2018-05-02 ENCOUNTER — Other Ambulatory Visit: Payer: Self-pay

## 2018-05-02 DIAGNOSIS — E2839 Other primary ovarian failure: Secondary | ICD-10-CM

## 2018-07-29 ENCOUNTER — Other Ambulatory Visit: Payer: Self-pay | Admitting: Neurosurgery

## 2018-07-29 DIAGNOSIS — C712 Malignant neoplasm of temporal lobe: Secondary | ICD-10-CM

## 2018-08-27 ENCOUNTER — Other Ambulatory Visit: Payer: Self-pay

## 2018-08-27 ENCOUNTER — Ambulatory Visit
Admission: RE | Admit: 2018-08-27 | Discharge: 2018-08-27 | Disposition: A | Discharge status: Home / Self Care | Payer: BC Managed Care – PPO | Source: Ambulatory Visit | Attending: Neurosurgery | Admitting: Neurosurgery

## 2018-08-27 DIAGNOSIS — C712 Malignant neoplasm of temporal lobe: Secondary | ICD-10-CM

## 2018-08-27 MED ORDER — GADOBENATE DIMEGLUMINE 529 MG/ML IV SOLN
15.0000 mL | Freq: Once | INTRAVENOUS | Status: AC | PRN
  Administered 2018-08-27: 15 mL via INTRAVENOUS

## 2018-12-20 ENCOUNTER — Ambulatory Visit (INDEPENDENT_AMBULATORY_CARE_PROVIDER_SITE_OTHER): Payer: BC Managed Care – PPO | Admitting: Otolaryngology

## 2018-12-20 ENCOUNTER — Other Ambulatory Visit: Payer: Self-pay

## 2018-12-20 ENCOUNTER — Encounter (INDEPENDENT_AMBULATORY_CARE_PROVIDER_SITE_OTHER): Payer: Self-pay | Admitting: Audiology

## 2018-12-20 VITALS — Temp 97.6°F

## 2018-12-20 DIAGNOSIS — H6981 Other specified disorders of Eustachian tube, right ear: Secondary | ICD-10-CM | POA: Diagnosis not present

## 2018-12-20 DIAGNOSIS — H903 Sensorineural hearing loss, bilateral: Secondary | ICD-10-CM

## 2018-12-20 DIAGNOSIS — H9313 Tinnitus, bilateral: Secondary | ICD-10-CM | POA: Diagnosis not present

## 2018-12-20 DIAGNOSIS — J3089 Other allergic rhinitis: Secondary | ICD-10-CM

## 2018-12-20 MED ORDER — TRIAMCINOLONE ACETONIDE 55 MCG/ACT NA AERO: 2.0000 | INHALATION_SPRAY | Freq: Every day | NASAL | 12 refills | Status: DC

## 2018-12-20 NOTE — Progress Notes (Signed)
HPI: Lindsey Daniels is a 62 y.o. female who presents for evaluation of right ear fullness, discomfort and increased ringing. Patient states that symptoms started four days ago. It started with a discomfort and then a fullness in the ear. The ringing in her ear, long-standing, then increased as well. Patient denies any sharp pains or ear drainage. She denies any tenderness over the ear. She states that she has had multiple hearing tests in the past, last one more than a year ago, that have shown high frequency hearing loss. She does endorse loud noise exposure in the past. Patient also complains of nasal congestion and states she has had allergies for a number of years for which she takes OTC anti-histamines and saline rinse.  Past Medical History:  Diagnosis Date  . Allergy   . Anal fissure   . Anxiety   . Cataract   . Depression   . Elevated LFTs   . Hyperglycemia   . Hyperlipidemia   . Hypertension   . Osteoarthritis   . Pars defect of lumbar spine   . Seizures ([place])   . Spondylolisthesis    lumbar   Past Surgical History:  Procedure Laterality Date  . cataract    . 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 HEEL SPUR SURGERY     Social History   Socioeconomic History  . Marital status: Married    Spouse name: separated-no Research scientist (life sciences)  . Number of children: 0  . Years of education: Not on file  . Highest education level: Not on file  Occupational History  . Occupation: RT    Employer: [employer] @ [place]: [employer] and [employer]  Social Needs  . Financial resource strain: Not on file  . Food insecurity    Worry: Not on file    Inability: Not on file  . Transportation needs    Medical: Not on file    Non-medical: Not on file  Tobacco  Use  . Smoking status: Never Smoker  . Smokeless tobacco: Never Used  Substance and Sexual Activity  . Alcohol use: Yes    Alcohol/week: 0.0 - 4.0 standard drinks    Comment: social  . Drug use: No  . Sexual activity: Not Currently    Partners: Male  Lifestyle  . Physical activity    Days per week: Not on file    Minutes per session: Not on file  . Stress: Not on file  Relationships  . Social Herbalist on phone: Not on file    Gets together: Not on file    Attends religious service: Not on file    Active member of club or organization: Not on file    Attends meetings of clubs or organizations: Not on file    Relationship status: Not on file  Other Topics Concern  . Not on file  Social History Narrative   Separated from second husband.  Very contentious split.   Family History  Problem Relation Age of Onset  . Mental illness Father        depression  . COPD Father   . Alcohol abuse Brother   . Breast cancer Neg Hx    Allergies  Allergen Reactions  . Rifapentine Other (See Comments)  Flu-like symptoms  . Amoxicillin Rash   Prior to Admission medications   Medication Sig Start Date End Date Taking? Authorizing Provider  aspirin EC 81 MG tablet Take 81 mg by mouth daily.    [provider]  atorvastatin (LIPITOR) 20 MG tablet Take 1 tablet (20 mg total) by mouth daily. 07/25/17   Harrison Mons, PA  cholecalciferol (VITAMIN D) 1000 units tablet Take 1,000 Units by mouth daily.    [provider]  cyclobenzaprine (FLEXERIL) 10 MG tablet TAKE 1 TABLET (10 MG TOTAL) BY MOUTH AT BEDTIME. Patient not taking: Reported on 06/30/2017 03/02/16   Harrison Mons, PA  fluticasone (FLONASE) 50 MCG/ACT nasal spray Place 1 spray into both nostrils 2 (two) times daily as needed for allergies.     [provider]  levETIRAcetam (KEPPRA) 500 MG tablet Take 1 tablet (500 mg total) by mouth 2 (two) times daily. 08/19/13   Domenic Polite, MD  meloxicam  (MOBIC) 15 MG tablet Take 15 mg by mouth daily.    [provider]  Multiple Vitamin (MULTIVITAMIN WITH MINERALS) TABS tablet Take 1 tablet by mouth at bedtime.    [provider]  sertraline (ZOLOFT) 100 MG tablet Take 1 tablet (100 mg total) by mouth daily. 06/30/17   Harrison Mons, PA  triamcinolone (NASACORT) 55 MCG/ACT AERO nasal inhaler Place 2 sprays into the nose at bedtime. In to each nostril 12/20/18   Rozetta Nunnery, MD  valACYclovir (VALTREX) 500 MG tablet Take 1 tablet (500 mg total) by mouth 2 (two) times daily. Takes only if needed for breakouts 06/30/17   Harrison Mons, PA     Positive ROS: positive for right ear tinnitus and fullness; otherwise negative  All other systems have been reviewed and were otherwise negative with the exception of those mentioned in the HPI and as above.  Physical Exam: General: Alert, no acute distress Ears: External ears without lesions or tenderness. Ear canals are clear bilaterally with intact, clear TMs. AC>Bc bilaterally. Audiogram reveals normal sloping to mild high frequency SNHL bilaterally; SRTs are 15 in the right and 10 dB in the left. Type A tympanograms bilaterally. Nasal: External nose without deformity or lesions. Moderate mucosal edema. Septum deviates to the right. Middle meatus bilaterally without polyps or masses. Oral: 1+ tonsils, post nasal drip; otherwise clear oropharynx Neck: No palpable adenopathy or masses   Assessment: Eustachian tube dysfunction, right Allergic rhinitis Tinnitus, bilateral Presbycusis  Plan: Reviewed audiogram with patient; normal hearing. Suspect majority of symptoms are from eustachian tube and allergies. Recommended Nasacort, 2 sprays each nostril, at night before bed. Also recommended saline rinses throughout the day.  For tinnitus, recommended masking noises as well as ear protection around loud noises. She will return as needed.   Christin Hoffstadt, PA-C   I  agree with the assessment and plan as outlined above. Radene Journey, MD

## 2018-12-22 ENCOUNTER — Ambulatory Visit (INDEPENDENT_AMBULATORY_CARE_PROVIDER_SITE_OTHER): Payer: BC Managed Care – PPO

## 2018-12-22 ENCOUNTER — Ambulatory Visit: Payer: BC Managed Care – PPO | Admitting: Podiatry

## 2018-12-22 ENCOUNTER — Other Ambulatory Visit: Payer: Self-pay

## 2018-12-22 ENCOUNTER — Other Ambulatory Visit: Payer: Self-pay | Admitting: Podiatry

## 2018-12-22 ENCOUNTER — Encounter: Payer: Self-pay | Admitting: Podiatry

## 2018-12-22 DIAGNOSIS — M722 Plantar fascial fibromatosis: Secondary | ICD-10-CM

## 2018-12-22 DIAGNOSIS — M79671 Pain in right foot: Secondary | ICD-10-CM | POA: Diagnosis not present

## 2018-12-22 DIAGNOSIS — M79672 Pain in left foot: Secondary | ICD-10-CM

## 2018-12-22 NOTE — Progress Notes (Signed)
Subjective:   Patient ID: Lindsey Daniels, female   DOB: 63 y.o.   MRN: [id number]   HPI Patient states that heels have started to hurt her a lot more recently and she is probably due for new orthotics as she wears them all the time and they have flattened out and are no longer supporting her arch adequately   ROS      Objective:  Physical Exam  Neurovascular status intact negative Bevelyn Buckles' sign noted with patient's heels being quite sore in the medial band bilateral with inflammation fluid around the medial band.  There is moderate depression of the arch noted bilateral     Assessment:  Acute plantar fasciitis bilateral with inflammation fluid in the medial band     Plan:  H&P reviewed condition and at this time discussed the chronic nature and casted for functional orthotic devices.  I then went ahead did sterile prep of each heel and injected the fascia 3 mg Kenalog 5 mg Xylocaine and instructed on physical therapy and reappoint to recheck  X-rays indicate small spur with moderate depression of the arch and no indications of stress fracture or other pathology

## 2018-12-22 NOTE — Patient Instructions (Signed)

## 2019-01-03 ENCOUNTER — Telehealth (INDEPENDENT_AMBULATORY_CARE_PROVIDER_SITE_OTHER): Payer: Self-pay | Admitting: Otolaryngology

## 2019-01-03 NOTE — Telephone Encounter (Signed)
Patient called concerning continuing drainage. She has been using nasal spray and saline rinses which have helped, but she continues to cough. Recommend she come in for further evaluation. She will call tomorrow to schedule appointment after looking at schedule for throat evaluation.

## 2019-01-04 ENCOUNTER — Ambulatory Visit (INDEPENDENT_AMBULATORY_CARE_PROVIDER_SITE_OTHER): Payer: BC Managed Care – PPO | Admitting: Otolaryngology

## 2019-01-04 ENCOUNTER — Other Ambulatory Visit: Payer: Self-pay

## 2019-01-04 ENCOUNTER — Encounter (INDEPENDENT_AMBULATORY_CARE_PROVIDER_SITE_OTHER): Payer: Self-pay | Admitting: Otolaryngology

## 2019-01-04 VITALS — Temp 97.3°F

## 2019-01-04 DIAGNOSIS — H6981 Other specified disorders of Eustachian tube, right ear: Secondary | ICD-10-CM

## 2019-01-04 DIAGNOSIS — K219 Gastro-esophageal reflux disease without esophagitis: Secondary | ICD-10-CM

## 2019-01-04 MED ORDER — OMEPRAZOLE 40 MG PO CPDR: 40.0000 mg | DELAYED_RELEASE_CAPSULE | Freq: Every day | ORAL | 1 refills | Status: DC

## 2019-01-04 NOTE — Progress Notes (Signed)
HPI: Lindsey Daniels is a 63 y.o. female who returns today for evaluation of ongoing cough. Since last visit, patient has used the Flonase nasal spray and saline rinses with benefit. Her ear feels better, her breathing has improved and her drainage is better. However, she continues to cough. She states she still thinks it is related to drainage that is in her throat she is unable to clear. The cough is non-productive. It comes from the throat. It worsens when she has to talk throughout the day. She was COVID tested yesterday which was negative. No fever, purulent drainage, ear discomfort. No further complaints today.  Past Medical History:  Diagnosis Date  . Allergy   . Anal fissure   . Anxiety   . Cataract   . Depression   . Elevated LFTs   . Hyperglycemia   . Hyperlipidemia   . Hypertension   . Osteoarthritis   . Pars defect of lumbar spine   . Seizures ([place])   . Spondylolisthesis    lumbar   Past Surgical History:  Procedure Laterality Date  . cataract    . 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 HEEL SPUR SURGERY     Social History   Socioeconomic History  . Marital status: Married    Spouse name: separated-no Research scientist (life sciences)  . Number of children: 0  . Years of education: Not on file  . Highest education level: Not on file  Occupational History  . Occupation: RT    Employer: [employer] @ [place]: [employer] and [employer]  Social Needs  . Financial resource strain: Not on file  . Food insecurity    Worry: Not on file    Inability: Not on file  . Transportation needs    Medical: Not on file    Non-medical: Not on file  Tobacco Use  . Smoking status: Never Smoker  . Smokeless tobacco: Never Used  Substance and Sexual Activity  .  Alcohol use: Yes    Alcohol/week: 0.0 - 4.0 standard drinks    Comment: social  . Drug use: No  . Sexual activity: Not Currently    Partners: Male  Lifestyle  . Physical activity    Days per week: Not on file    Minutes per session: Not on file  . Stress: Not on file  Relationships  . Social Herbalist on phone: Not on file    Gets together: Not on file    Attends religious service: Not on file    Active member of club or organization: Not on file    Attends meetings of clubs or organizations: Not on file    Relationship status: Not on file  Other Topics Concern  . Not on file  Social History Narrative   Separated from second husband.  Very contentious split.   Family History  Problem Relation Age of Onset  . Mental illness Father        depression  . COPD Father   . Alcohol abuse Brother   . Breast cancer Neg Hx    Allergies  Allergen Reactions  . Rifapentine Other (See Comments)    Flu-like symptoms  . Amoxicillin Rash   Prior to Admission medications   Medication Sig Start Date End  Date Taking? Authorizing Provider  aspirin EC 81 MG tablet Take 81 mg by mouth daily.   Yes [provider]  atorvastatin (LIPITOR) 20 MG tablet Take 1 tablet (20 mg total) by mouth daily. 07/25/17  Yes Jeffery, Domingo Mend, PA  cholecalciferol (VITAMIN D) 1000 units tablet Take 1,000 Units by mouth daily.   Yes [provider]  cyclobenzaprine (FLEXERIL) 10 MG tablet TAKE 1 TABLET (10 MG TOTAL) BY MOUTH AT BEDTIME. 03/02/16  Yes Jeffery, Chelle, PA  fluticasone (FLONASE) 50 MCG/ACT nasal spray Place 1 spray into both nostrils 2 (two) times daily as needed for allergies.    Yes [provider]  levETIRAcetam (KEPPRA) 500 MG tablet Take 1 tablet (500 mg total) by mouth 2 (two) times daily. 08/19/13  Yes Domenic Polite, MD  meloxicam (MOBIC) 15 MG tablet Take 15 mg by mouth daily.   Yes [provider]  Multiple Vitamin (MULTIVITAMIN WITH MINERALS) TABS  tablet Take 1 tablet by mouth at bedtime.   Yes [provider]  sertraline (ZOLOFT) 100 MG tablet Take 1 tablet (100 mg total) by mouth daily. 06/30/17  Yes Jeffery, Chelle, PA  triamcinolone (NASACORT) 55 MCG/ACT AERO nasal inhaler Place 2 sprays into the nose at bedtime. In to each nostril 12/20/18  Yes Rozetta Nunnery, MD  valACYclovir (VALTREX) 500 MG tablet Take 1 tablet (500 mg total) by mouth 2 (two) times daily. Takes only if needed for breakouts 06/30/17  Yes Jeffery, Chelle, PA  omeprazole (PRILOSEC) 40 MG capsule Take 1 capsule (40 mg total) by mouth daily before supper. 01/04/19   Rozetta Nunnery, MD     Positive ROS: otherwise negative  All other systems have been reviewed and were otherwise negative with the exception of those mentioned in the HPI and as above.  Physical Exam: Constitutional: Alert, well-appearing, no acute distress Ears: External ears without lesions or tenderness. Ear canals are clear bilaterally with intact, clear TMs. Symmetric hearing bilaterally. Nasal: External nose without lesions. Septum deviates to right slightly. Otherwise clear nasal passages  Oral: Lips and gums without lesions. Tongue and palate mucosa without lesions. Posterior oropharynx with minimal drainage. Neck: No palpable adenopathy or masses Respiratory: Breathing comfortably  Skin: No facial/neck lesions or rash noted.  Laryngoscopy  Date/Time: 01/04/2019 11:38 AM Performed by: Rozetta Nunnery, MD Authorized by: Rozetta Nunnery, MD   Consent:    Consent obtained:  Verbal   Consent given by:  Patient Procedure details:    Indications: assessment of airway     Medication:  Afrin   Instrument: flexible fiberoptic laryngoscope     Scope location: left nare   Nasal cavity:    Left inferior turbinates: normal   Septum:    normal   Sinus:    Left middle meatus: normal     Left nasopharynx: normal     Left Eustachian tube orifices: normal   Mouth:     Oropharynx: normal     Vallecula: normal     Base of tongue: normal     Epiglottis: normal   Throat:    Right hypopharynx: normal     Left hypopharynx: normal     Pyriform sinus: normal     Normal: mild arytenoid edema with mucosal thickening.     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 No masses, polyps or signs of  infection. Recommend Omeprazole 40mg  daily before dinner. Prescription provided. Recommended patient continue her Flonase and saline rinses. She will return as needed.    Markevius Trombetta, PA-C  I have personally seen and examined this patient. I agree with the assessment and plan as outlined above. Radene Journey, MD

## 2019-01-19 ENCOUNTER — Encounter: Payer: Self-pay | Admitting: Podiatry

## 2019-01-19 ENCOUNTER — Other Ambulatory Visit: Payer: Self-pay

## 2019-01-19 ENCOUNTER — Ambulatory Visit: Payer: BC Managed Care – PPO | Admitting: Orthotics

## 2019-01-19 ENCOUNTER — Ambulatory Visit: Payer: BC Managed Care – PPO | Admitting: Podiatry

## 2019-01-19 DIAGNOSIS — M722 Plantar fascial fibromatosis: Secondary | ICD-10-CM

## 2019-01-20 NOTE — Progress Notes (Signed)
Subjective:   Patient ID: Lindsey Daniels, female   DOB: 63 y.o.   MRN: [id number]   HPI Patient presents stating still having on and off pain at night but overall feeling much better than before and patient is also here to pick up orthotics   ROS      Objective:  Physical Exam  Neurovascular status intact with patient's inflammation improved with mild discomfort noted only upon deep palpation but quite a bit better than previous     Assessment:  Improving fasciitis-like symptoms with patient having old orthotics      Plan:  Patient is working with Scientist, forensic and at this time recommended that the new orthotics be started and gave instructions for this and went ahead today gave instructions on full stretch anti-inflammatories shoe gear modifications orthotics and will be seen back as needed

## 2019-01-20 NOTE — Progress Notes (Signed)
Patient came in today to pick up custom made foot orthotics.  The goals were accomplished and the patient reported no dissatisfaction with said orthotics.  Patient was advised of breakin period and how to report any issues. 

## 2019-01-24 ENCOUNTER — Other Ambulatory Visit: Payer: Self-pay | Admitting: Physician Assistant

## 2019-01-24 DIAGNOSIS — Z1231 Encounter for screening mammogram for malignant neoplasm of breast: Secondary | ICD-10-CM

## 2019-01-31 ENCOUNTER — Other Ambulatory Visit: Payer: Self-pay

## 2019-01-31 ENCOUNTER — Ambulatory Visit: Payer: BC Managed Care – PPO | Admitting: Orthotics

## 2019-01-31 DIAGNOSIS — M79671 Pain in right foot: Secondary | ICD-10-CM

## 2019-01-31 DIAGNOSIS — M722 Plantar fascial fibromatosis: Secondary | ICD-10-CM

## 2019-01-31 DIAGNOSIS — M79672 Pain in left foot: Secondary | ICD-10-CM

## 2019-01-31 NOTE — Progress Notes (Signed)
Make f/o more narrow

## 2019-03-14 ENCOUNTER — Other Ambulatory Visit: Payer: Self-pay

## 2019-03-14 ENCOUNTER — Ambulatory Visit
Admission: RE | Admit: 2019-03-14 | Discharge: 2019-03-14 | Disposition: A | Discharge status: Home / Self Care | Payer: BC Managed Care – PPO | Source: Ambulatory Visit | Attending: Physician Assistant | Admitting: Physician Assistant

## 2019-03-14 DIAGNOSIS — Z1231 Encounter for screening mammogram for malignant neoplasm of breast: Secondary | ICD-10-CM

## 2019-03-16 DIAGNOSIS — M722 Plantar fascial fibromatosis: Secondary | ICD-10-CM

## 2019-05-09 DIAGNOSIS — R638 Other symptoms and signs concerning food and fluid intake: Secondary | ICD-10-CM | POA: Insufficient documentation

## 2019-07-13 ENCOUNTER — Other Ambulatory Visit (INDEPENDENT_AMBULATORY_CARE_PROVIDER_SITE_OTHER): Payer: Self-pay | Admitting: Otolaryngology

## 2019-07-27 ENCOUNTER — Other Ambulatory Visit: Payer: Self-pay

## 2019-07-27 ENCOUNTER — Encounter: Payer: Self-pay | Admitting: Podiatry

## 2019-07-27 ENCOUNTER — Ambulatory Visit: Payer: BC Managed Care – PPO | Admitting: Podiatry

## 2019-07-27 VITALS — Temp 97.5°F

## 2019-07-27 DIAGNOSIS — M722 Plantar fascial fibromatosis: Secondary | ICD-10-CM | POA: Diagnosis not present

## 2019-07-30 NOTE — Progress Notes (Signed)
Subjective:   Patient ID: Lindsey Daniels, female   DOB: 64 y.o.   MRN: [id number]   HPI Patient states she seems to be improved on the inside but is having some pain in the outside of her heel.  Patient states it is localized    ROS      Objective:  Physical Exam  Neurovascular status found to be intact muscle strength was adequate with patient found to have discomfort that is more now on the lateral side of the plantar heel with inflammation     Assessment:  Plantar fasciitis left more lateral side with pain     Plan:  H&P reviewed condition at this time I did a sterile prep and injected from the lateral side 3 mg Kenalog 5 mg Xylocaine tolerated well and will be seen back as symptoms indicate

## 2020-02-01 DIAGNOSIS — K219 Gastro-esophageal reflux disease without esophagitis: Secondary | ICD-10-CM | POA: Insufficient documentation

## 2020-02-01 DIAGNOSIS — Z9889 Other specified postprocedural states: Secondary | ICD-10-CM | POA: Insufficient documentation

## 2020-02-14 ENCOUNTER — Other Ambulatory Visit: Payer: Self-pay | Admitting: Physician Assistant

## 2020-02-14 DIAGNOSIS — Z780 Asymptomatic menopausal state: Secondary | ICD-10-CM

## 2020-02-14 DIAGNOSIS — Z1231 Encounter for screening mammogram for malignant neoplasm of breast: Secondary | ICD-10-CM

## 2020-03-02 ENCOUNTER — Other Ambulatory Visit: Payer: Self-pay | Admitting: Physician Assistant

## 2020-03-02 DIAGNOSIS — E2839 Other primary ovarian failure: Secondary | ICD-10-CM

## 2020-06-04 ENCOUNTER — Other Ambulatory Visit: Payer: Self-pay

## 2020-06-04 ENCOUNTER — Ambulatory Visit
Admission: RE | Admit: 2020-06-04 | Discharge: 2020-06-04 | Disposition: A | Discharge status: Home / Self Care | Payer: BC Managed Care – PPO | Source: Ambulatory Visit | Attending: Physician Assistant | Admitting: Physician Assistant

## 2020-06-04 DIAGNOSIS — Z1231 Encounter for screening mammogram for malignant neoplasm of breast: Secondary | ICD-10-CM

## 2020-06-04 DIAGNOSIS — E2839 Other primary ovarian failure: Secondary | ICD-10-CM

## 2020-06-13 DIAGNOSIS — M85851 Other specified disorders of bone density and structure, right thigh: Secondary | ICD-10-CM | POA: Insufficient documentation

## 2020-11-06 ENCOUNTER — Ambulatory Visit
Admission: EM | Admit: 2020-11-06 | Discharge: 2020-11-06 | Disposition: A | Discharge status: Home / Self Care | Payer: BC Managed Care – PPO | Attending: Emergency Medicine | Admitting: Emergency Medicine

## 2020-11-06 ENCOUNTER — Other Ambulatory Visit: Payer: Self-pay

## 2020-11-06 DIAGNOSIS — J3489 Other specified disorders of nose and nasal sinuses: Secondary | ICD-10-CM

## 2020-11-06 MED ORDER — CETIRIZINE HCL 10 MG PO CAPS: 10.0000 mg | ORAL_CAPSULE | Freq: Every day | ORAL | 0 refills | Status: DC

## 2020-11-06 MED ORDER — MECLIZINE HCL 25 MG PO TABS: 25.0000 mg | ORAL_TABLET | Freq: Three times a day (TID) | ORAL | 0 refills | Status: DC | PRN

## 2020-11-06 MED ORDER — FLUTICASONE PROPIONATE 50 MCG/ACT NA SUSP: 1.0000 | Freq: Every day | NASAL | 0 refills | Status: DC

## 2020-11-06 MED ORDER — DOXYCYCLINE HYCLATE 100 MG PO CAPS: 100.0000 mg | ORAL_CAPSULE | Freq: Two times a day (BID) | ORAL | 0 refills | Status: DC

## 2020-11-06 MED ORDER — PREDNISONE 20 MG PO TABS: 40.0000 mg | ORAL_TABLET | Freq: Every day | ORAL | 0 refills | Status: AC

## 2020-11-06 NOTE — Discharge Instructions (Addendum)
COVID test pending, monitor MyChart for results Daily cetirizine and Flonase to help with sinus pressure, congestion Prednisone 40 mg daily x5 days to help with sinus inflammation and pressure Meclizine as needed for dizziness/nausea-will cause drowsiness, do not drive or work after taking Please continue to rest and fluids If not seeing any improvement over the next 3 to 4 days with the above and continuing to have significant sinus pressure may fill prior prescription for doxycycline provided Please go to emergency room if developing any worsening dizziness, headaches, vision changes, difficulty speaking or weakness

## 2020-11-06 NOTE — ED Triage Notes (Signed)
Pt report otalgia, headache, hearting changes in right ear. Patient states she is having some ear pressure and fatigue.  Started: last Saturday  OTC medications are not working per patient

## 2020-11-06 NOTE — ED Provider Notes (Signed)
[facility]    CSN: [id number] Arrival date & time: 11/06/20  1558      History   Chief Complaint Chief Complaint  Patient presents with   Fatigue   Otalgia    HPI Lindsey Daniels is a 65 y.o. female history of hypertension presenting today for evaluation of headaches and congestion.  Reports over the past 5 to 6 days she has had sinus pressure and headache.  Has had associated fatigue and ear pressure/ringing.  She has felt off balance/dizzy.  Denies spinning sensation.  Has had a few episodes of vomiting.  Denies any vision changes.  Denies any chest pain or shortness of breath, cough.  Denies any one-sided weakness.  HPI  Past Medical History:  Diagnosis Date   Allergy    Anal fissure    Anxiety    Cataract    Depression    Elevated LFTs    Hyperglycemia    Hyperlipidemia    Hypertension    Osteoarthritis    Pars defect of lumbar spine    Seizures ([place])    Spondylolisthesis    lumbar    Patient Active Problem List   Diagnosis Date Noted   Increased body mass index 05/09/2019   Seasonal allergies 11/21/2016   Loss of height 11/21/2016   Elevated BP without diagnosis of hypertension 11/21/2016   Exposure to TB 06/07/2015   HSV infection 06/07/2015   Depressed mood 06/07/2015   Carpal tunnel syndrome 03/06/2014   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   Syncope 08/18/2013   Convulsions/seizures ([place]) 08/18/2013   History of benign neoplasm of brain 02/16/2013   Bilateral leg pain 09/07/2012   Foot pain, bilateral 09/07/2012   Plantar fasciitis BILATERAL 08/25/2012   Hyperlipidemia 12/24/2011    Past Surgical History:  Procedure Laterality Date   cataract     CRANIOTOMY N/A 10/02/2013   Procedure: 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;  Surgeon: Hosie Spangle, MD;  Location: [facility];  Service: Neurosurgery;  Laterality: N/A;  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   HEEL SPUR SURGERY      OB History   No  obstetric history on file.      Home Medications    Prior to Admission medications   Medication Sig Start Date End Date Taking? Authorizing Provider  Cetirizine HCl 10 MG CAPS Take 1 capsule (10 mg total) by mouth daily for 10 days. 11/06/20 11/16/20 Yes Donyelle Enyeart C, PA-C  doxycycline (VIBRAMYCIN) 100 MG capsule Take 1 capsule (100 mg total) by mouth 2 (two) times daily. 11/09/20  Yes Romaine Maciolek C, PA-C  fluticasone (FLONASE) 50 MCG/ACT nasal spray Place 1-2 sprays into both nostrils daily. 11/06/20  Yes Samaia Iwata C, PA-C  meclizine (ANTIVERT) 25 MG tablet Take 1 tablet (25 mg total) by mouth 3 (three) times daily as needed for dizziness. 11/06/20  Yes Margrete Delude C, PA-C  predniSONE (DELTASONE) 20 MG tablet Take 2 tablets (40 mg total) by mouth daily with breakfast for 5 days. 11/06/20 11/11/20 Yes Avier Jech C, PA-C  aspirin EC 81 MG tablet Take 81 mg by mouth daily.    [provider]  atorvastatin (LIPITOR) 20 MG tablet Take 1 tablet (20 mg total) by mouth daily. 07/25/17   Harrison Mons, PA  cholecalciferol (VITAMIN D) 1000 units tablet Take 1,000 Units by mouth daily.    [provider]  cyclobenzaprine (FLEXERIL) 10 MG tablet TAKE 1 TABLET (10 MG TOTAL) BY MOUTH AT  BEDTIME. 03/02/16   Harrison Mons, PA  levETIRAcetam (KEPPRA) 500 MG tablet Take 1 tablet (500 mg total) by mouth 2 (two) times daily. 08/19/13   Domenic Polite, MD  meloxicam (MOBIC) 15 MG tablet Take 15 mg by mouth daily.    [provider]  Multiple Vitamin (MULTIVITAMIN WITH MINERALS) TABS tablet Take 1 tablet by mouth at bedtime.    [provider]  omeprazole (PRILOSEC) 40 MG capsule TAKE 1 CAPSULE (40 MG TOTAL) BY MOUTH DAILY BEFORE SUPPER. 12/12/19   Rozetta Nunnery, MD  sertraline (ZOLOFT) 100 MG tablet Take 1 tablet (100 mg total) by mouth daily. 06/30/17   Harrison Mons, PA  triamcinolone (NASACORT) 55 MCG/ACT AERO nasal inhaler Place 2 sprays into the  nose at bedtime. In to each nostril 12/20/18   Rozetta Nunnery, MD  valACYclovir (VALTREX) 500 MG tablet Take 1 tablet (500 mg total) by mouth 2 (two) times daily. Takes only if needed for breakouts 06/30/17   Harrison Mons, PA    Family History Family History  Problem Relation Age of Onset   Mental illness Father        depression   COPD Father    Alcohol abuse Brother    Breast cancer Neg Hx     Social History Social History   Tobacco Use   Smoking status: Never   Smokeless tobacco: Never  Substance Use Topics   Alcohol use: Yes    Alcohol/week: 0.0 - 4.0 standard drinks    Comment: social   Drug use: No     Allergies   Rifapentine, Amoxicillin, and Clavulanic acid   Review of Systems Review of Systems  Constitutional:  Negative for activity change, appetite change, chills, fatigue and fever.  HENT:  Positive for congestion and rhinorrhea. Negative for ear pain, sinus pressure, sore throat and trouble swallowing.   Eyes:  Negative for discharge and redness.  Respiratory:  Positive for cough. Negative for chest tightness and shortness of breath.   Cardiovascular:  Negative for chest pain.  Gastrointestinal:  Negative for abdominal pain, diarrhea, nausea and vomiting.  Musculoskeletal:  Negative for myalgias.  Skin:  Negative for rash.  Neurological:  Positive for headaches. Negative for dizziness and light-headedness.    Physical Exam Triage Vital Signs ED Triage Vitals [11/06/20 1605]  Enc Vitals Group     BP (!) 160/81     Pulse Rate 86     Resp 16     Temp 97.9 F (36.6 C)     Temp Source Oral     SpO2 96 %     Weight      Height      Head Circumference      Peak Flow      Pain Score      Pain Loc      Pain Edu?      Excl. in [place]?    No data found.  Updated Vital Signs BP (!) 160/81 (BP Location: Right Arm)   Pulse 86   Temp 97.9 F (36.6 C) (Oral)   Resp 16   SpO2 96%   Visual Acuity Right Eye Distance:   Left Eye Distance:    Bilateral Distance:    Right Eye Near:   Left Eye Near:    Bilateral Near:     Physical Exam Vitals and nursing note reviewed.  Constitutional:      Appearance: She is well-developed.     Comments: No acute distress  HENT:  Head: Normocephalic and atraumatic.     Ears:     Comments: Bilateral ears without tenderness to palpation of external auricle, tragus and mastoid, EAC's without erythema or swelling, TM's with good bony landmarks and cone of light. Non erythematous.      Nose: Nose normal.     Mouth/Throat:     Comments: Oral mucosa pink and moist, no tonsillar enlargement or exudate. Posterior pharynx patent and nonerythematous, no uvula deviation or swelling. Normal phonation.   Eyes:     Extraocular Movements: Extraocular movements intact.     Conjunctiva/sclera: Conjunctivae normal.     Pupils: Pupils are equal, round, and reactive to light.  Cardiovascular:     Rate and Rhythm: Normal rate and regular rhythm.  Pulmonary:     Effort: Pulmonary effort is normal. No respiratory distress.     Comments: Breathing comfortably at rest, CTABL, no wheezing, rales or other adventitious sounds auscultated  Abdominal:     General: There is no distension.  Musculoskeletal:        General: Normal range of motion.     Cervical back: Neck supple.  Skin:    General: Skin is warm and dry.  Neurological:     General: No focal deficit present.     Mental Status: She is alert and oriented to person, place, and time. Mental status is at baseline.     Cranial Nerves: No cranial nerve deficit.     Motor: No weakness.     Gait: Gait normal.     UC Treatments / Results  Labs (all labs ordered are listed, but only abnormal results are displayed) Labs Reviewed  NOVEL CORONAVIRUS, NAA    EKG   Radiology No results found.  Procedures Procedures (including critical care time)  Medications Ordered in UC Medications - No data to display  Initial Impression / Assessment  and Plan / UC Course  I have reviewed the triage vital signs and the nursing notes.  Pertinent labs & imaging results that were available during my care of the patient were reviewed by me and considered in my medical decision making (see chart for details).     Suspect likely sinus pressure/inflammation contributing to disequilibrium/underlying vertigo, no other neurodeficits suggestive of underlying intracranial abnormality, will proceed with initiating on daily antihistamine, Flonase, prednisone course, meclizine as needed, deferring antibiotics at this time, but advised patient if still having significant sinus pressure over the next 3 to 4 days no improvement with prednisone may begin doxycycline.  Discussed warning signs to go to emergency room for.  Discussed strict return precautions. Patient verbalized understanding and is agreeable with plan.  Final Clinical Impressions(s) / UC Diagnoses   Final diagnoses:  Sinus pressure     Discharge Instructions      COVID test pending, monitor MyChart for results Daily cetirizine and Flonase to help with sinus pressure, congestion Prednisone 40 mg daily x5 days to help with sinus inflammation and pressure Meclizine as needed for dizziness/nausea-will cause drowsiness, do not drive or work after taking Please continue to rest and fluids If not seeing any improvement over the next 3 to 4 days with the above and continuing to have significant sinus pressure may fill prior prescription for doxycycline provided Please go to emergency room if developing any worsening dizziness, headaches, vision changes, difficulty speaking or weakness     ED Prescriptions     Medication Sig Dispense Auth. Provider   fluticasone (FLONASE) 50 MCG/ACT nasal spray Place 1-2 sprays into  both nostrils daily. 16 g Dovey Fatzinger C, PA-C   Cetirizine HCl 10 MG CAPS Take 1 capsule (10 mg total) by mouth daily for 10 days. 10 capsule Kalika Smay C, PA-C    meclizine (ANTIVERT) 25 MG tablet Take 1 tablet (25 mg total) by mouth 3 (three) times daily as needed for dizziness. 30 tablet Elmer Boutelle C, PA-C   predniSONE (DELTASONE) 20 MG tablet Take 2 tablets (40 mg total) by mouth daily with breakfast for 5 days. 10 tablet Geetika Laborde C, PA-C   doxycycline (VIBRAMYCIN) 100 MG capsule Take 1 capsule (100 mg total) by mouth 2 (two) times daily. 20 capsule Abdulwahab Demelo, [place] C, PA-C      PDMP not reviewed this encounter.   Janith Lima, [place] 11/07/20 [phone]

## 2020-11-07 LAB — SARS-COV-2, NAA 2 DAY TAT

## 2020-11-07 LAB — NOVEL CORONAVIRUS, NAA: SARS-CoV-2, NAA: NOT DETECTED

## 2020-11-25 ENCOUNTER — Other Ambulatory Visit: Payer: Self-pay

## 2020-11-25 ENCOUNTER — Emergency Department
Admission: EM | Admit: 2020-11-25 | Discharge: 2020-11-25 | Disposition: A | Discharge status: Home / Self Care | Payer: BC Managed Care – PPO | Attending: Emergency Medicine | Admitting: Emergency Medicine

## 2020-11-25 ENCOUNTER — Emergency Department: Payer: BC Managed Care – PPO

## 2020-11-25 ENCOUNTER — Encounter (HOSPITAL_COMMUNITY): Payer: Self-pay

## 2020-11-25 ENCOUNTER — Inpatient Hospital Stay (HOSPITAL_COMMUNITY)
Admission: EM | Admit: 2020-11-25 | Discharge: 2020-12-03 | DRG: 025 | Disposition: A | Discharge status: Home / Self Care | Payer: BC Managed Care – PPO | Attending: Neurosurgery | Admitting: Neurosurgery

## 2020-11-25 DIAGNOSIS — F32A Depression, unspecified: Secondary | ICD-10-CM | POA: Diagnosis present

## 2020-11-25 DIAGNOSIS — I10 Essential (primary) hypertension: Secondary | ICD-10-CM | POA: Insufficient documentation

## 2020-11-25 DIAGNOSIS — E785 Hyperlipidemia, unspecified: Secondary | ICD-10-CM | POA: Diagnosis present

## 2020-11-25 DIAGNOSIS — F419 Anxiety disorder, unspecified: Secondary | ICD-10-CM | POA: Diagnosis present

## 2020-11-25 DIAGNOSIS — Z20822 Contact with and (suspected) exposure to covid-19: Secondary | ICD-10-CM | POA: Diagnosis present

## 2020-11-25 DIAGNOSIS — D496 Neoplasm of unspecified behavior of brain: Secondary | ICD-10-CM | POA: Diagnosis present

## 2020-11-25 DIAGNOSIS — G936 Cerebral edema: Secondary | ICD-10-CM | POA: Diagnosis present

## 2020-11-25 DIAGNOSIS — R269 Unspecified abnormalities of gait and mobility: Secondary | ICD-10-CM | POA: Insufficient documentation

## 2020-11-25 DIAGNOSIS — G9389 Other specified disorders of brain: Secondary | ICD-10-CM | POA: Insufficient documentation

## 2020-11-25 DIAGNOSIS — Z7982 Long term (current) use of aspirin: Secondary | ICD-10-CM | POA: Insufficient documentation

## 2020-11-25 DIAGNOSIS — C719 Malignant neoplasm of brain, unspecified: Secondary | ICD-10-CM | POA: Diagnosis present

## 2020-11-25 DIAGNOSIS — R42 Dizziness and giddiness: Secondary | ICD-10-CM | POA: Diagnosis present

## 2020-11-25 DIAGNOSIS — Z825 Family history of asthma and other chronic lower respiratory diseases: Secondary | ICD-10-CM

## 2020-11-25 DIAGNOSIS — R262 Difficulty in walking, not elsewhere classified: Secondary | ICD-10-CM

## 2020-11-25 DIAGNOSIS — Z79899 Other long term (current) drug therapy: Secondary | ICD-10-CM | POA: Diagnosis not present

## 2020-11-25 DIAGNOSIS — I447 Left bundle-branch block, unspecified: Secondary | ICD-10-CM | POA: Diagnosis present

## 2020-11-25 DIAGNOSIS — R110 Nausea: Secondary | ICD-10-CM | POA: Insufficient documentation

## 2020-11-25 DIAGNOSIS — R29810 Facial weakness: Secondary | ICD-10-CM | POA: Diagnosis not present

## 2020-11-25 DIAGNOSIS — E43 Unspecified severe protein-calorie malnutrition: Secondary | ICD-10-CM | POA: Insufficient documentation

## 2020-11-25 DIAGNOSIS — Z888 Allergy status to other drugs, medicaments and biological substances status: Secondary | ICD-10-CM

## 2020-11-25 DIAGNOSIS — Z880 Allergy status to penicillin: Secondary | ICD-10-CM | POA: Diagnosis not present

## 2020-11-25 LAB — URINALYSIS, COMPLETE (UACMP) WITH MICROSCOPIC
Bacteria, UA: NONE SEEN
Bilirubin Urine: NEGATIVE
Glucose, UA: NEGATIVE mg/dL
Hgb urine dipstick: NEGATIVE
Ketones, ur: 5 mg/dL — AB
Leukocytes,Ua: NEGATIVE
Nitrite: NEGATIVE
Protein, ur: NEGATIVE mg/dL
Specific Gravity, Urine: 1.021 (ref 1.005–1.030)
pH: 6 (ref 5.0–8.0)

## 2020-11-25 LAB — BASIC METABOLIC PANEL
Anion gap: 7 (ref 5–15)
BUN: 12 mg/dL (ref 8–23)
CO2: 28 mmol/L (ref 22–32)
Calcium: 9.4 mg/dL (ref 8.9–10.3)
Chloride: 103 mmol/L (ref 98–111)
Creatinine, Ser: 0.73 mg/dL (ref 0.44–1.00)
GFR, Estimated: >60 mL/min (ref >60)
Glucose, Bld: 109 mg/dL — ABNORMAL HIGH (ref 70–99)
Potassium: 3.3 mmol/L — ABNORMAL LOW (ref 3.5–5.1)
Sodium: 138 mmol/L (ref 135–145)

## 2020-11-25 LAB — CBC
HCT: 40.9 % (ref 36.0–46.0)
HCT: 41.5 % (ref 36.0–46.0)
Hemoglobin: 14.7 g/dL (ref 12.0–15.0)
Hemoglobin: 14.5 g/dL (ref 12.0–15.0)
MCH: 31.2 pg (ref 26.0–34.0)
MCH: 29.8 pg (ref 26.0–34.0)
MCHC: 35.9 g/dL (ref 30.0–36.0)
MCHC: 34.9 g/dL (ref 30.0–36.0)
MCV: 86.8 fL (ref 80.0–100.0)
MCV: 85.2 fL (ref 80.0–100.0)
Platelets: 236 10*3/uL (ref 150–400)
Platelets: 226 10*3/uL (ref 150–400)
RBC: 4.71 MIL/uL (ref 3.87–5.11)
RBC: 4.87 MIL/uL (ref 3.87–5.11)
RDW: 12.3 % (ref 11.5–15.5)
RDW: 12.4 % (ref 11.5–15.5)
WBC: 7.2 10*3/uL (ref 4.0–10.5)
WBC: 7.4 10*3/uL (ref 4.0–10.5)
nRBC: 0 % (ref 0.0–0.2)
nRBC: 0 % (ref 0.0–0.2)

## 2020-11-25 LAB — RESP PANEL BY RT-PCR (FLU A&B, COVID) ARPGX2
Influenza A by PCR: NEGATIVE
Influenza B by PCR: NEGATIVE
SARS Coronavirus 2 by RT PCR: NEGATIVE

## 2020-11-25 LAB — CREATININE, SERUM
Creatinine, Ser: 0.77 mg/dL (ref 0.44–1.00)
GFR, Estimated: >60 mL/min (ref >60)

## 2020-11-25 LAB — TROPONIN I (HIGH SENSITIVITY)
Troponin I (High Sensitivity): 4 ng/L (ref <18)
Troponin I (High Sensitivity): 4 ng/L (ref <18)

## 2020-11-25 LAB — MRSA NEXT GEN BY PCR, NASAL: MRSA by PCR Next Gen: NOT DETECTED

## 2020-11-25 LAB — HIV ANTIBODY (ROUTINE TESTING W REFLEX): HIV Screen 4th Generation wRfx: NONREACTIVE

## 2020-11-25 MED ORDER — ONDANSETRON HCL 4 MG PO TABS: 4.0000 mg | ORAL_TABLET | Freq: Four times a day (QID) | ORAL | Status: DC | PRN

## 2020-11-25 MED ORDER — HEPARIN SODIUM (PORCINE) 5000 UNIT/ML IJ SOLN
5000.0000 [IU] | Freq: Three times a day (TID) | INTRAMUSCULAR | Status: DC
  Administered 2020-11-25 – 2020-12-03 (×22): 5000 [IU] via SUBCUTANEOUS
  Filled 2020-11-25 (×22): qty 1

## 2020-11-25 MED ORDER — SENNOSIDES-DOCUSATE SODIUM 8.6-50 MG PO TABS: 1.0000 | ORAL_TABLET | Freq: Every evening | ORAL | Status: DC | PRN

## 2020-11-25 MED ORDER — DEXAMETHASONE 4 MG PO TABS
4.0000 mg | ORAL_TABLET | Freq: Four times a day (QID) | ORAL | Status: DC
  Administered 2020-11-25 – 2020-11-30 (×19): 4 mg via ORAL
  Filled 2020-11-25 (×19): qty 1

## 2020-11-25 MED ORDER — ONDANSETRON HCL 4 MG/2ML IJ SOLN: 4.0000 mg | Freq: Four times a day (QID) | INTRAMUSCULAR | Status: DC | PRN

## 2020-11-25 MED ORDER — POTASSIUM CHLORIDE IN NACL 20-0.9 MEQ/L-% IV SOLN
INTRAVENOUS | Status: DC
  Filled 2020-11-25: qty 1000

## 2020-11-25 MED ORDER — ADULT MULTIVITAMIN W/MINERALS CH
1.0000 | ORAL_TABLET | Freq: Every day | ORAL | Status: DC
  Administered 2020-11-25 – 2020-12-02 (×8): 1 via ORAL
  Filled 2020-11-25 (×8): qty 1

## 2020-11-25 MED ORDER — ATORVASTATIN CALCIUM 10 MG PO TABS
20.0000 mg | ORAL_TABLET | Freq: Every day | ORAL | Status: DC
  Administered 2020-11-25 – 2020-12-03 (×8): 20 mg via ORAL
  Filled 2020-11-25 (×8): qty 2

## 2020-11-25 MED ORDER — DOXYCYCLINE HYCLATE 100 MG PO CAPS: 100.0000 mg | ORAL_CAPSULE | Freq: Two times a day (BID) | ORAL | Status: DC

## 2020-11-25 MED ORDER — TRIAMCINOLONE ACETONIDE 55 MCG/ACT NA AERO
2.0000 | INHALATION_SPRAY | Freq: Every day | NASAL | Status: DC
  Administered 2020-11-25 – 2020-12-02 (×8): 2 via NASAL
  Filled 2020-11-25: qty 21.6

## 2020-11-25 MED ORDER — MORPHINE SULFATE (PF) 2 MG/ML IV SOLN: 2.0000 mg | INTRAVENOUS | Status: DC | PRN

## 2020-11-25 MED ORDER — LEVETIRACETAM 500 MG PO TABS
500.0000 mg | ORAL_TABLET | Freq: Two times a day (BID) | ORAL | Status: DC
  Administered 2020-11-25 – 2020-11-28 (×7): 500 mg via ORAL
  Filled 2020-11-25 (×7): qty 1

## 2020-11-25 MED ORDER — ACETAMINOPHEN 650 MG RE SUPP: 650.0000 mg | Freq: Four times a day (QID) | RECTAL | Status: DC | PRN

## 2020-11-25 MED ORDER — SENNA 8.6 MG PO TABS
1.0000 | ORAL_TABLET | Freq: Two times a day (BID) | ORAL | Status: DC
  Administered 2020-11-25 – 2020-12-03 (×15): 8.6 mg via ORAL
  Filled 2020-11-25 (×15): qty 1

## 2020-11-25 MED ORDER — DEXAMETHASONE SODIUM PHOSPHATE 10 MG/ML IJ SOLN
10.0000 mg | Freq: Once | INTRAMUSCULAR | Status: AC
  Administered 2020-11-25: 10 mg via INTRAVENOUS
  Filled 2020-11-25: qty 1

## 2020-11-25 MED ORDER — VITAMIN D 25 MCG (1000 UNIT) PO TABS
1000.0000 [IU] | ORAL_TABLET | Freq: Every day | ORAL | Status: DC
  Administered 2020-11-26 – 2020-12-03 (×7): 1000 [IU] via ORAL
  Filled 2020-11-25 (×8): qty 1

## 2020-11-25 MED ORDER — BISACODYL 5 MG PO TBEC: 5.0000 mg | DELAYED_RELEASE_TABLET | Freq: Every day | ORAL | Status: DC | PRN

## 2020-11-25 MED ORDER — HYDROCODONE-ACETAMINOPHEN 5-325 MG PO TABS: 1.0000 | ORAL_TABLET | ORAL | Status: DC | PRN

## 2020-11-25 MED ORDER — DIAZEPAM 5 MG PO TABS
5.0000 mg | ORAL_TABLET | Freq: Four times a day (QID) | ORAL | Status: DC | PRN
  Administered 2020-11-26 – 2020-11-30 (×5): 5 mg via ORAL
  Filled 2020-11-25 (×5): qty 1

## 2020-11-25 MED ORDER — ONDANSETRON 4 MG PO TBDP
4.0000 mg | ORAL_TABLET | Freq: Once | ORAL | Status: AC
  Administered 2020-11-25: 4 mg via ORAL
  Filled 2020-11-25: qty 1

## 2020-11-25 MED ORDER — SERTRALINE HCL 100 MG PO TABS
100.0000 mg | ORAL_TABLET | Freq: Every day | ORAL | Status: DC
  Administered 2020-11-25 – 2020-12-03 (×8): 100 mg via ORAL
  Filled 2020-11-25 (×4): qty 2
  Filled 2020-11-25 (×3): qty 1

## 2020-11-25 MED ORDER — VALACYCLOVIR HCL 500 MG PO TABS
500.0000 mg | ORAL_TABLET | Freq: Two times a day (BID) | ORAL | Status: DC | PRN
  Filled 2020-11-25: qty 1

## 2020-11-25 MED ORDER — FLUTICASONE PROPIONATE 50 MCG/ACT NA SUSP
1.0000 | Freq: Every day | NASAL | Status: DC
  Administered 2020-11-25 – 2020-11-28 (×4): 1 via NASAL
  Administered 2020-11-30 – 2020-12-01 (×2): 2 via NASAL
  Administered 2020-12-01: 1 via NASAL
  Administered 2020-12-02: 2 via NASAL
  Filled 2020-11-25: qty 16

## 2020-11-25 MED ORDER — PANTOPRAZOLE SODIUM 40 MG PO TBEC
40.0000 mg | DELAYED_RELEASE_TABLET | Freq: Every day | ORAL | Status: DC
  Administered 2020-11-25 – 2020-12-03 (×8): 40 mg via ORAL
  Filled 2020-11-25 (×7): qty 1

## 2020-11-25 MED ORDER — ACETAMINOPHEN 325 MG PO TABS
650.0000 mg | ORAL_TABLET | Freq: Four times a day (QID) | ORAL | Status: DC | PRN
  Administered 2020-11-26 – 2020-11-29 (×4): 650 mg via ORAL
  Filled 2020-11-25 (×5): qty 2

## 2020-11-25 MED ORDER — METHOCARBAMOL 1000 MG/10ML IJ SOLN
500.0000 mg | Freq: Four times a day (QID) | INTRAVENOUS | Status: DC | PRN
  Filled 2020-11-25 (×2): qty 5

## 2020-11-25 NOTE — ED Triage Notes (Signed)
Patient reports via carelink from [facility] with confusion, nausea, and balance issues. Patient has h/x brain mass.

## 2020-11-25 NOTE — ED Provider Notes (Addendum)
  Physical Exam  There were no vitals taken for this visit.  Physical Exam  ED Course/Procedures     Procedures  MDM  Patient is transferred from [facility] for brain mass causing dizziness. Patient has been dizzy for the last 3 weeks.  She states that she has been falling over.  Patient had craniotomy in 2015 by Dr. Sherwood Gambler for brain tumor.  CT showed right cerebral hemisphere mass-effects with midline shift to the left.  Neurosurgery was consulted at [facility] and recommend ED to ED transfer   5:02 PM I examined patient on arrival.  Patient has pronator drift on the left side and patient also has a positive Romberg sign.  Patient has no obvious facial droop.  Patient has no obvious dysmetria on exam.  However patient is very unsteady and required 2 people assist and was still unable to walk.  Consulted Dr. Christella Noa from neurosurgery.  He states that he will see patient and will admit her to the neurosurgical ICU   CRITICAL CARE Performed by: Wandra Arthurs   Total critical care time:40 minutes  Critical care time was exclusive of separately billable procedures and treating other patients.  Critical care was necessary to treat or prevent imminent or life-threatening deterioration.  Critical care was time spent personally by me on the following activities: development of treatment plan with patient and/or surrogate as well as nursing, discussions with consultants, evaluation of patient's response to treatment, examination of patient, obtaining history from patient or surrogate, ordering and performing treatments and interventions, ordering and review of laboratory studies, ordering and review of radiographic studies, pulse oximetry and re-evaluation of patient's condition.       Drenda Freeze, MD 11/25/20 2329    Drenda Freeze, MD 11/25/20 [phone]

## 2020-11-25 NOTE — ED Triage Notes (Addendum)
Pt reports for the past week she has been having dizziness and nausea and pt feels off balance, pt brought to the ER by a coworker. Pt reports that she was just seen a couple weeks ago for ear issues and states that she cont to have that now. Pt does have her sweatshirt on upside down and when asked about it she states that she did it on purpose and only wanted her sleeves on, pt's phone rings while in triage and states that she is sorry and that the phone should be turned off and makes a comment about wanting to turn it off but can't figure out how to do so. 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

## 2020-11-25 NOTE — ED Provider Notes (Signed)
Emergency Medicine Provider Triage Evaluation Note  Andrea Ferrer, a 65 y.o. female  was evaluated in triage.  Pt complains of right ear pressure, nausea, dizziness and feeling off balance. She denies head injury, paresthesia, facial droop, or slurred speech. She notes "not feeling right."  Review of Systems  Positive: Dizziness, confusion Negative: CP, SOB  Physical Exam  BP (!) 153/83 (BP Location: Right Arm)   Pulse 71   Temp 98.3 F (36.8 C) (Oral)   Resp 16   Ht 5\' 1"  (1.549 m)   Wt 70.3 kg   SpO2 96%   BMI 29.29 kg/m  Gen:   Awake, no distress  NAD Resp:  Normal effort CTA MSK:   Moves extremities without difficulty  Other:  CVS: RRR  Medical Decision Making  Medically screening exam initiated at 11:28 AM.  Appropriate orders placed.  Denika Krone was informed that the remainder of the evaluation will be completed by another provider, this initial triage assessment does not replace that evaluation, and the importance of remaining in the ED until their evaluation is complete.  Patient with ED evaluation of dizziness, nausea, and confusion.    Melvenia Needles, PA-C 11/25/20 1220    Merlyn Lot, MD 11/25/20 [phone]

## 2020-11-25 NOTE — ED Notes (Signed)
CARELINK  CALLED  PER  DR  Cheri Fowler  MD

## 2020-11-25 NOTE — H&P (Signed)
Lindsey Daniels is an 65 y.o. female.   Chief Complaint: confusion, dizziness,incoordination, headaches, nausea x 3weeks HPI: Lindsey Daniels is a 65 y.o. 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 For 3 weeks she has not been herself. Decreased hearing, headaches, gait difficulties, problems with coordination. This was noticed by friends, colleagues at work also. This led to her being brought to the ED at [facility] where a head CT revealed a probable mass, right to left shift, and cerebral edema. Ms. Paradiso was transferred to [facility] for further care.   Past Medical History:  Diagnosis Date   Allergy    Anal fissure    Anxiety    Cataract    Depression    Elevated LFTs    Hyperglycemia    Hyperlipidemia    Hypertension    Osteoarthritis    Pars defect of lumbar spine    Seizures (HCC)    Spondylolisthesis    lumbar    Past Surgical History:  Procedure Laterality Date   cataract     CRANIOTOMY N/A 10/02/2013   Procedure: 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;  Surgeon: Hosie Spangle, MD;  Location: [place] [facility] ORS;  Service: Neurosurgery;  Laterality: N/A;  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   HEEL SPUR SURGERY      Family History  Problem Relation Age of Onset   Mental illness Father        depression   COPD Father    Alcohol abuse Brother    Breast cancer Neg Hx    Social History:  reports that she has never smoked. She has never used smokeless tobacco. She reports current alcohol use. She reports that she does not use drugs.  Allergies:  Allergies  Allergen Reactions   Rifapentine Other (See Comments)    Flu-like symptoms   Amoxicillin Rash   Clavulanic Acid Rash    (Not in a hospital admission)   Results for orders placed or performed during the hospital encounter of 11/25/20 (from the past 48 hour(s))  Basic metabolic panel     Status: Abnormal   Collection Time: 11/25/20 11:18 AM  Result Value  Ref Range   Sodium 138 135 - 145 mmol/L   Potassium 3.3 (L) 3.5 - 5.1 mmol/L   Chloride 103 98 - 111 mmol/L   CO2 28 22 - 32 mmol/L   Glucose, Bld 109 (H) 70 - 99 mg/dL    Comment: Glucose reference range applies only to samples taken after fasting for at least 8 hours.   BUN 12 8 - 23 mg/dL   Creatinine, Ser 0.73 0.44 - 1.00 mg/dL   Calcium 9.4 8.9 - 10.3 mg/dL   GFR, Estimated >60 >60 mL/min    Comment: (NOTE) Calculated using the CKD-EPI Creatinine Equation (2021)    Anion gap 7 5 - 15    Comment: Performed at [facility], [address] [place], [place] [address]  CBC     Status: None   Collection Time: 11/25/20 11:18 AM  Result Value Ref Range   WBC 7.2 4.0 - 10.5 K/uL   RBC 4.71 3.87 - 5.11 MIL/uL   Hemoglobin 14.7 12.0 - 15.0 g/dL   HCT 40.9 36.0 - 46.0 %   MCV 86.8 80.0 - 100.0 fL   MCH 31.2 26.0 - 34.0 pg   MCHC 35.9 30.0 - 36.0 g/dL   RDW 12.3 11.5 - 15.5 %   Platelets  236 150 - 400 K/uL   nRBC 0.0 0.0 - 0.2 %    Comment: Performed at [facility], [address] [place], [place] [address]  Urinalysis, Complete w Microscopic     Status: Abnormal   Collection Time: 11/25/20 11:18 AM  Result Value Ref Range   Color, Urine YELLOW (A) YELLOW   APPearance CLEAR (A) CLEAR   Specific Gravity, Urine 1.021 1.005 - 1.030   pH 6.0 5.0 - 8.0   Glucose, UA NEGATIVE NEGATIVE mg/dL   Hgb urine dipstick NEGATIVE NEGATIVE   Bilirubin Urine NEGATIVE NEGATIVE   Ketones, ur 5 (A) NEGATIVE mg/dL   Protein, ur NEGATIVE NEGATIVE mg/dL   Nitrite NEGATIVE NEGATIVE   Leukocytes,Ua NEGATIVE NEGATIVE   RBC / HPF 0-5 0 - 5 RBC/hpf   WBC, UA 0-5 0 - 5 WBC/hpf   Bacteria, UA NONE SEEN NONE SEEN   Squamous Epithelial / LPF 0-5 0 - 5   Mucus PRESENT     Comment: Performed at [facility], [place], [place] [address]  Troponin I (High Sensitivity)     Status: None   Collection Time: 11/25/20 11:18 AM  Result Value Ref Range   Troponin I  (High Sensitivity) 4 <18 ng/L    Comment: (NOTE) Elevated high sensitivity troponin I (hsTnI) values and significant  changes across serial measurements may suggest ACS but many other  chronic and acute conditions are known to elevate hsTnI results.  Refer to the "Links" section for chest pain algorithms and additional  guidance. Performed at [facility], [place],  [address]   Resp Panel by RT-PCR (Flu A&B, Covid) Nasopharyngeal Swab     Status: None   Collection Time: 11/25/20  1:48 PM   Specimen: Nasopharyngeal Swab; Nasopharyngeal(NP) swabs in vial transport medium  Result Value Ref Range   SARS Coronavirus 2 by RT PCR NEGATIVE NEGATIVE    Comment: (NOTE) SARS-CoV-2 target nucleic acids are NOT DETECTED.  The SARS-CoV-2 RNA is generally detectable in upper respiratory specimens during the acute phase of infection. The lowest concentration of SARS-CoV-2 viral copies this assay can detect is 138 copies/mL. A negative result does not preclude SARS-Cov-2 infection and should not be used as the sole basis for treatment or other patient management decisions. A negative result may occur with  improper specimen collection/handling, submission of specimen other than nasopharyngeal swab, presence of viral mutation(s) within the areas targeted by this assay, and inadequate number of viral copies(<138 copies/mL). A negative result must be combined with clinical observations, patient history, and epidemiological information. The expected result is Negative.  Fact Sheet for Patients:  EntrepreneurPulse.com.au  Fact Sheet for Healthcare Providers:  IncredibleEmployment.be  This test is no t yet approved or cleared by the [place] FDA and  has been authorized for detection and/or diagnosis of SARS-CoV-2 by FDA under an Emergency Use Authorization (EUA). This EUA will remain  in effect (meaning this test can be used)  for the duration of the COVID-19 declaration under Section 564(b)(1) of the Act, 21 U.S.C.section 360bbb-3(b)(1), unless the authorization is terminated  or revoked sooner.       Influenza A by PCR NEGATIVE NEGATIVE   Influenza B by PCR NEGATIVE NEGATIVE    Comment: (NOTE) The Xpert Xpress SARS-CoV-2/FLU/RSV plus assay is intended as an aid in the diagnosis of influenza from Nasopharyngeal swab specimens and should not be used as a sole basis for treatment. Nasal washings and aspirates are  unacceptable for Xpert Xpress SARS-CoV-2/FLU/RSV testing.  Fact Sheet for Patients: EntrepreneurPulse.com.au  Fact Sheet for Healthcare Providers: IncredibleEmployment.be  This test is not yet approved or cleared by the [place] FDA and has been authorized for detection and/or diagnosis of SARS-CoV-2 by FDA under an Emergency Use Authorization (EUA). This EUA will remain in effect (meaning this test can be used) for the duration of the COVID-19 declaration under Section 564(b)(1) of the Act, 21 U.S.C. section 360bbb-3(b)(1), unless the authorization is terminated or revoked.  Performed at [facility], [place], [place] [address]   Troponin I (High Sensitivity)     Status: None   Collection Time: 11/25/20  2:46 PM  Result Value Ref Range   Troponin I (High Sensitivity) 4 <18 ng/L    Comment: (NOTE) Elevated high sensitivity troponin I (hsTnI) values and significant  changes across serial measurements may suggest ACS but many other  chronic and acute conditions are known to elevate hsTnI results.  Refer to the "Links" section for chest pain algorithms and additional  guidance. Performed at [facility], [address] [place], [place] [address]    CT HEAD WO CONTRAST  Result Date: 11/25/2020 CLINICAL DATA:  Dizziness EXAM: CT HEAD WITHOUT CONTRAST TECHNIQUE: Contiguous axial images were obtained from the  base of the skull through the vertex without intravenous contrast. COMPARISON:  Correlation made with MRI from 2020 FINDINGS: Brain: Postoperative changes are identified in the right temporal lobe. There is new extensive edema in the right cerebral hemisphere with possible superimposed low-density lesions. Effacement of the right lateral ventricle. Leftward midline shift measures 1.2 cm. There is mild trapping of the left lateral ventricle. Medialization of the right uncus. Vascular: No hyperdense vessel. Skull: Right craniotomy.  Otherwise unremarkable. Sinuses/Orbits: No acute finding. Other: None. IMPRESSION: New extensive edema in the right cerebral hemisphere with possible superimposed low-density lesions. Associated mass effect with effacement right lateral ventricle, 1.2 cm leftward midline shift, medialized right uncus, and mild trapping of the left lateral ventricle. These results were called by telephone at the time of interpretation on 11/25/2020 at 12:47 pm to provider [facility] , who verbally acknowledged these results. Electronically Signed   By: Macy Mis M.D.   On: 11/25/2020 12:47    Review of Systems  Constitutional:  Positive for activity change.  HENT:  Positive for hearing loss.   Respiratory: Negative.    Cardiovascular: Negative.   Gastrointestinal:  Positive for nausea.  Endocrine: Negative.   Genitourinary: Negative.   Musculoskeletal: Negative.   Skin: Negative.   Neurological:  Positive for dizziness, seizures, speech difficulty and headaches.  Hematological: Negative.   Psychiatric/Behavioral: Negative.     Blood pressure 99/70, pulse 73, temperature 97.9 F (36.6 C), temperature source Oral, resp. rate 17, height 5\' 1"  (1.549 m), weight 70.3 kg, SpO2 97 %. Physical Exam HENT:     Head: Normocephalic.     Comments: Well healed scar right parietal temporal region    Right Ear: Tympanic membrane and external ear normal.     Left Ear: Tympanic membrane and  external ear normal.     Nose: Nose normal.     Mouth/Throat:     Mouth: Mucous membranes are moist.     Pharynx: Oropharynx is clear.  Eyes:     Extraocular Movements: Extraocular movements intact.     Conjunctiva/sclera: Conjunctivae normal.     Pupils: Pupils are equal, round, and reactive to light.  Cardiovascular:  Rate and Rhythm: Normal rate and regular rhythm.  Pulmonary:     Effort: Pulmonary effort is normal.     Breath sounds: Normal breath sounds.  Abdominal:     General: Abdomen is flat.     Palpations: Abdomen is soft.  Musculoskeletal:     Cervical back: Normal range of motion.  Skin:    General: Skin is warm and dry.  Neurological:     Comments: +drift left upper extremity Perrl, full eom Tongue and uvala midline Mild assymetry, left droop with smile No weakness in extremities on manual exam Confused, immediate recall is quite poor     Assessment/Plan Admit for an MRI. 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 Currently symptomatic will start decadron, and obtain an MRI.   Ashok Pall, MD 11/25/2020, 7:27 PM

## 2020-11-25 NOTE — ED Provider Notes (Signed)
[facility] Emergency Department Provider Note   ____________________________________________   Event Date/Time   First MD Initiated Contact with Patient 11/25/20 1303     (approximate)  I have reviewed the triage vital signs and the nursing notes.   HISTORY  Chief Complaint Dizziness and Nausea    HPI Lindsey Daniels is a 65 y.o. female who has a stated past medical history of of astrocytoma that was removed in 2015 who presents for a constellation of symptoms including difficulty hearing, vertigo, and ambulatory dysfunction over the last 3 weeks.  Patient states the symptoms have been worsening over this time and at this point have been keeping her from being able to ambulate without difficulty.  Patient does have another caretaker at bedside who states that she has been "not making sense" for the last 3 weeks and this has been worsening as well.  Patient is oriented at this time.          Past Medical History:  Diagnosis Date   Allergy    Anal fissure    Anxiety    Cataract    Depression    Elevated LFTs    Hyperglycemia    Hyperlipidemia    Hypertension    Osteoarthritis    Pars defect of lumbar spine    Seizures ([place])    Spondylolisthesis    lumbar    Patient Active Problem List   Diagnosis Date Noted   Increased body mass index 05/09/2019   Seasonal allergies 11/21/2016   Loss of height 11/21/2016   Elevated BP without diagnosis of hypertension 11/21/2016   Exposure to TB 06/07/2015   HSV infection 06/07/2015   Depressed mood 06/07/2015   Carpal tunnel syndrome 03/06/2014   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   Syncope 08/18/2013   Convulsions/seizures ([place]) 08/18/2013   History of benign neoplasm of brain 02/16/2013   Bilateral leg pain 09/07/2012   Foot pain, bilateral 09/07/2012   Plantar fasciitis BILATERAL 08/25/2012   Hyperlipidemia 12/24/2011    Past Surgical History:  Procedure Laterality Date    cataract     CRANIOTOMY N/A 10/02/2013   Procedure: 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;  Surgeon: Hosie Spangle, MD;  Location: [place] [facility]UR[facility] ORS;  Service: Neurosurgery;  Laterality: N/A;  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   HEEL SPUR SURGERY      Prior to Admission medications   Medication Sig Start Date End Date Taking? Authorizing Provider  aspirin EC 81 MG tablet Take 81 mg by mouth daily.    [provider]  atorvastatin (LIPITOR) 20 MG tablet Take 1 tablet (20 mg total) by mouth daily. 07/25/17   Harrison Mons, PA  Cetirizine HCl 10 MG CAPS Take 1 capsule (10 mg total) by mouth daily for 10 days. 11/06/20 11/16/20  Wieters, Hallie C, PA-C  cholecalciferol (VITAMIN D) 1000 units tablet Take 1,000 Units by mouth daily.    [provider]  cyclobenzaprine (FLEXERIL) 10 MG tablet TAKE 1 TABLET (10 MG TOTAL) BY MOUTH AT BEDTIME. 03/02/16   Harrison Mons, PA  doxycycline (VIBRAMYCIN) 100 MG capsule Take 1 capsule (100 mg total) by mouth 2 (two) times daily. 11/09/20   Wieters, Hallie C, PA-C  fluticasone (FLONASE) 50 MCG/ACT nasal spray Place 1-2 sprays into both nostrils daily. 11/06/20   Wieters, Hallie C, PA-C  levETIRAcetam (KEPPRA) 500 MG tablet Take 1 tablet (500 mg total) by mouth 2 (two) times daily. 08/19/13   Domenic Polite, MD  meclizine (ANTIVERT) 25 MG tablet Take 1 tablet (25 mg total) by mouth 3 (three) times daily as needed for dizziness. 11/06/20   Wieters, Hallie C, PA-C  meloxicam (MOBIC) 15 MG tablet Take 15 mg by mouth daily.    [provider]  Multiple Vitamin (MULTIVITAMIN WITH MINERALS) TABS tablet Take 1 tablet by mouth at bedtime.    [provider]  omeprazole (PRILOSEC) 40 MG capsule TAKE 1 CAPSULE (40 MG TOTAL) BY MOUTH DAILY BEFORE SUPPER. 12/12/19   Rozetta Nunnery, MD  sertraline (ZOLOFT) 100 MG tablet Take 1 tablet (100 mg total) by mouth daily. 06/30/17   Harrison Mons, PA  triamcinolone (NASACORT)  55 MCG/ACT AERO nasal inhaler Place 2 sprays into the nose at bedtime. In to each nostril 12/20/18   Rozetta Nunnery, MD  valACYclovir (VALTREX) 500 MG tablet Take 1 tablet (500 mg total) by mouth 2 (two) times daily. Takes only if needed for breakouts 06/30/17   Harrison Mons, PA    Allergies Rifapentine, Amoxicillin, and Clavulanic acid  Family History  Problem Relation Age of Onset   Mental illness Father        depression   COPD Father    Alcohol abuse Brother    Breast cancer Neg Hx     Social History Social History   Tobacco Use   Smoking status: Never   Smokeless tobacco: Never  Substance Use Topics   Alcohol use: Yes    Alcohol/week: 0.0 - 4.0 standard drinks    Comment: social   Drug use: No    Review of Systems Constitutional: No fever/chills Eyes: No visual changes. ENT: No sore throat. Cardiovascular: Denies chest pain. Respiratory: Denies shortness of breath. Gastrointestinal: No abdominal pain.  No nausea, no vomiting.  No diarrhea. Genitourinary: Negative for dysuria. Musculoskeletal: Negative for acute arthralgias Skin: Negative for rash. Neurological: Positive for headaches and vertigo, denies weakness/numbness/paresthesias in any extremity Psychiatric: Negative for suicidal ideation/homicidal ideation   ____________________________________________   PHYSICAL EXAM:  VITAL SIGNS: ED Triage Vitals  Enc Vitals Group     BP 11/25/20 1111 (!) 153/83     Pulse Rate 11/25/20 1111 71     Resp 11/25/20 1111 16     Temp 11/25/20 1111 98.3 F (36.8 C)     Temp Source 11/25/20 1111 Oral     SpO2 11/25/20 1111 96 %     Weight 11/25/20 1114 155 lb (70.3 kg)     Height 11/25/20 1114 5\' 1"  (1.549 m)     Head Circumference --      Peak Flow --      Pain Score --      Pain Loc --      Pain Edu? --      Excl. in [place]? --    Constitutional: Alert and oriented. Well appearing and in no acute distress. Eyes: Conjunctivae are normal. PERRL. Head:  Atraumatic. Nose: No congestion/rhinnorhea. Mouth/Throat: Mucous membranes are moist. Neck: No stridor Cardiovascular: Grossly normal heart sounds.  Good peripheral circulation. Respiratory: Normal respiratory effort.  No retractions. Gastrointestinal: Soft and nontender. No distention. Musculoskeletal: No obvious deformities Neurologic: Slow speech and language.  Slow shuffling gait.  Sensation intact bilaterally.  Strength 5/5 in all extremities.  Cranial nerves II through X intact Skin:  Skin is warm and dry. No rash noted. Psychiatric: Mood and affect are normal. Speech and behavior are normal.  ____________________________________________   LABS (all labs ordered are listed, but only abnormal results are displayed)  Labs Reviewed  BASIC METABOLIC PANEL - Abnormal; Notable for the following components:      Result Value   Potassium 3.3 (*)    Glucose, Bld 109 (*)    All other components within normal limits  URINALYSIS, COMPLETE (UACMP) WITH MICROSCOPIC - Abnormal; Notable for the following components:   Color, Urine YELLOW (*)    APPearance CLEAR (*)    Ketones, ur 5 (*)    All other components within normal limits  RESP PANEL BY RT-PCR (FLU A&B, COVID) ARPGX2  CBC  CBG MONITORING, ED  TROPONIN I (HIGH SENSITIVITY)  TROPONIN I (HIGH SENSITIVITY)   ____________________________________________  EKG  ED ECG REPORT I, Naaman Plummer, the attending physician, personally viewed and interpreted this ECG.  Date: 11/25/2020 EKG Time: 1130 Rate: 68 Rhythm: normal sinus rhythm QRS Axis: normal Intervals: Left bundle branch block ST/T Wave abnormalities: normal Narrative Interpretation: LBBB.  No evidence of acute ischemia  ____________________________________________  RADIOLOGY  ED MD interpretation: CT of the head without contrast shows a new edema in the right cerebral hemisphere with possible superimposed low-density lesions with associated mass-effect and  effacement of the right lateral ventricle with 1.2 cm leftward midline shift and a medialized right uncus likely representing recurrence of her previously removed astrocytoma  Official radiology report(s): CT HEAD WO CONTRAST  Result Date: 11/25/2020 CLINICAL DATA:  Dizziness EXAM: CT HEAD WITHOUT CONTRAST TECHNIQUE: Contiguous axial images were obtained from the base of the skull through the vertex without intravenous contrast. COMPARISON:  Correlation made with MRI from 2020 FINDINGS: Brain: Postoperative changes are identified in the right temporal lobe. There is new extensive edema in the right cerebral hemisphere with possible superimposed low-density lesions. Effacement of the right lateral ventricle. Leftward midline shift measures 1.2 cm. There is mild trapping of the left lateral ventricle. Medialization of the right uncus. Vascular: No hyperdense vessel. Skull: Right craniotomy.  Otherwise unremarkable. Sinuses/Orbits: No acute finding. Other: None. IMPRESSION: New extensive edema in the right cerebral hemisphere with possible superimposed low-density lesions. Associated mass effect with effacement right lateral ventricle, 1.2 cm leftward midline shift, medialized right uncus, and mild trapping of the left lateral ventricle. These results were called by telephone at the time of interpretation on 11/25/2020 at 12:47 pm to provider [facility] , who verbally acknowledged these results. Electronically Signed   By: Macy Mis M.D.   On: 11/25/2020 12:47    ____________________________________________   PROCEDURES  Procedure(s) performed (including Critical Care):  .1-3 Lead EKG Interpretation Performed by: Naaman Plummer, MD Authorized by: Naaman Plummer, MD     Interpretation: normal     ECG rate:  68   ECG rate assessment: normal     Rhythm: sinus rhythm     Ectopy: none     Conduction: normal    CRITICAL CARE Performed by: Naaman Plummer   Total critical care time: 25  minutes  Critical care time was exclusive of separately billable procedures and treating other patients.  Critical care was necessary to treat or prevent imminent or life-threatening deterioration.  Critical care was time spent personally by me on the following activities: development of treatment plan with patient and/or surrogate as well as nursing, discussions with consultants, evaluation of patient's response to treatment, examination of patient, obtaining history from patient or surrogate, ordering and performing treatments and interventions, ordering and review of laboratory studies, ordering and review of radiographic studies, pulse oximetry and re-evaluation of patient's condition.  ____________________________________________  INITIAL IMPRESSION / ASSESSMENT AND PLAN / ED COURSE  As part of my medical decision making, I reviewed the following data within the electronic medical record, if available:  Nursing notes reviewed and incorporated, Labs reviewed, EKG interpreted, Old chart reviewed, Radiograph reviewed and Notes from prior ED visits reviewed and incorporated        Patient is a 65 year old female with the above-stated past medical history who presents for 3 weeks of worsening vertigo, headaches, and hearing difficulties as well as ambulatory dysfunction  Differential diagnosis includes but is not limited to: CVA, TIA, BPPV, intracerebral mass  Head CT: Shows likely recurrence of patient's previous astrocytoma with associated edema Tx: 10 mg Decadron IV Consults: I spoke to Dr. Cari Caraway in our neurosurgery team who recommends admission as well as steroid treatment and feels comfortable with patient being transferred to [facility] as patient has already requested this as this is where she had her resection done previously in 2015  I spoke to the neurosurgery team at [facility] and agrees to accept this patient in transfer I spoke to the on-call ER attending who agrees to  accept this patient as an ER to ER transfer  Dispo: Transfer to [facility] emergency department with neurosurgery consultation      ____________________________________________   FINAL CLINICAL IMPRESSION(S) / ED DIAGNOSES  Final diagnoses:  Mass of cerebral hemisphere  Cerebral edema [facility])  Vertigo  Ambulatory dysfunction     ED Discharge Orders     None        Note:  This document was prepared using Dragon voice recognition software and may include unintentional dictation errors.    Naaman Plummer, MD 11/25/20 [phone]

## 2020-11-26 ENCOUNTER — Inpatient Hospital Stay (HOSPITAL_COMMUNITY): Payer: BC Managed Care – PPO

## 2020-11-26 MED ORDER — ENSURE ENLIVE PO LIQD: 237.0000 mL | Freq: Two times a day (BID) | ORAL | Status: DC

## 2020-11-26 MED ORDER — CHLORHEXIDINE GLUCONATE CLOTH 2 % EX PADS
6.0000 | MEDICATED_PAD | Freq: Every day | CUTANEOUS | Status: DC
  Administered 2020-11-26 – 2020-12-03 (×7): 6 via TOPICAL

## 2020-11-26 MED ORDER — GADOBUTROL 1 MMOL/ML IV SOLN
7.0000 mL | Freq: Once | INTRAVENOUS | Status: AC | PRN
  Administered 2020-11-26: 7 mL via INTRAVENOUS

## 2020-11-26 NOTE — Progress Notes (Signed)
Patient ID: Lindsey Daniels, female   DOB: 10-12-1955, 65 y.o.   MRN: [id number] BP 140/73 (BP Location: Right Arm)   Pulse 81   Temp (!) 97.5 F (36.4 C) (Oral)   Resp (!) 27   Ht 5\' 1"  (1.549 m)   Wt 72.4 kg   SpO2 95%   BMI 30.16 kg/m  Alert, confused, but can follow a conversation. I informed her the tumor had grown back. I recommended a craniotomy for tumor resection. Either Thursday or Friday. Moving all extremities, left drift Perrl, mild facial droop on left side Exam unchanged, speech is clear, fluent

## 2020-11-27 ENCOUNTER — Other Ambulatory Visit: Payer: Self-pay | Admitting: Neurosurgery

## 2020-11-27 MED ORDER — BOOST / RESOURCE BREEZE PO LIQD CUSTOM
1.0000 | Freq: Two times a day (BID) | ORAL | Status: DC
  Administered 2020-11-28 – 2020-12-02 (×5): 1 via ORAL
  Filled 2020-11-27: qty 1

## 2020-11-27 MED ORDER — PROSOURCE PLUS PO LIQD
30.0000 mL | Freq: Two times a day (BID) | ORAL | Status: DC
  Administered 2020-11-28 (×2): 30 mL via ORAL
  Filled 2020-11-27 (×4): qty 30

## 2020-11-27 NOTE — Progress Notes (Signed)
Patient ID: Lindsey Daniels, female   DOB: [birth year], 65 y.o.   MRN: [id number] BP (!) 152/72 (BP Location: Right Arm)   Pulse 75   Temp (!) 97.4 F (36.3 C) (Oral)   Resp 18   Ht 5\' 1"  (1.549 m)   Wt 72.4 kg   SpO2 98%   BMI 30.16 kg/m  Alert, will follow all commands Speech is clear, fluent. Remains confused at times Moving all extremities well Will schedule for craniotomy this friday

## 2020-11-27 NOTE — Progress Notes (Addendum)
Initial Nutrition Assessment  DOCUMENTATION CODES:   Severe malnutrition in context of acute illness/injury  INTERVENTION:   Multivitamin w/ minerals daily  Discontinue Ensure Enlive  Boost Breeze po BID, each supplement provides 250 kcal and 9 grams of protein  30 ml ProSource Plus BID, each supplement provides 100 kcals and 15 grams protein.   Encourage good PO intake   NUTRITION DIAGNOSIS:   Severe Malnutrition related to acute illness (Brain Tumor) as evidenced by energy intake < or equal to 50% for > or equal to 5 days, percent weight loss, mild muscle depletion.  GOAL:   Patient will meet greater than or equal to 90% of their needs  MONITOR:   PO intake, Weight trends, I & O's, Supplement acceptance  REASON FOR ASSESSMENT:   Malnutrition Screening Tool    ASSESSMENT:   65 y.o. female presented to [facility] ED with confusion, dizziness, and headaches noticed by friends. PMH includes brain tumor resection August 2015 and HTN. Pt transferred to [facility] post- head CT finding of mass, right-to-left shift, and cerebral edema.   Planned craniotomy on Friday 10/14.   Pt reports that her appetite has been poor for 3-4 weeks. States that she picks and grazes at food, when she is hungry she eats. Reports that the past month she has mainly been eating saltine crackers and ginger ale because when she eats she would feel sick and nauseous. Prior to her poor appetite she states her typical intake includes: Breakfast: 1/2 BLT biscuit sandwich and tea, Lunch: frozen meal or noodles, or soup, Dinner: soup or salad.  Per EMR, pt ate 100% of lunch on 10/11.  Per pt, her UBW is 163-164# and recently weighed 153#. Noticed that she has lost about 10# within the time frame of not feeling well. This would be a 6% weight loss, which is significant for the time frame. She states that she often does not weigh herself, she goes by how her clothes fit. Pt stated that she was not mad about the  10# weight loss. 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  Discussed ONS with pt, pt reports that she does not like Ensure (PLDN will discontinue). Discussed alternative ONS, pt agreeable to Boost Breeze and ProSource to prevent further weight loss and prepare for OR on Friday. Pt expressed concern with gaining weight with supplements and being in the hospital, stuck in bed. Reassured her that the supplement are to help her and provide additional protein and calories to prevent further weight loss and prepare and help post-op.   Medications reviewed and include: Decadron, Vitamin D3, Heparin, Protonix, Senna, MVI, IV antibiotics Labs reviewed: Potassium 3.3    NUTRITION - FOCUSED PHYSICAL EXAM:  Flowsheet Row Most Recent Value  Orbital Region No depletion  Upper Arm Region No depletion  Thoracic and Lumbar Region No depletion  Buccal Region No depletion  Temple Region Mild depletion  Clavicle Bone Region No depletion  Clavicle and Acromion Bone Region No depletion  Scapular Bone Region No depletion  Dorsal Hand Mild depletion  Patellar Region No depletion  Anterior Thigh Region No depletion  Posterior Calf Region No depletion  Edema (RD Assessment) None  Hair Reviewed  Eyes Reviewed  Mouth Reviewed  Skin Reviewed  Nails Reviewed       Diet Order:   Diet Order             Diet regular Room service appropriate? Yes; Fluid consistency: Thin  Diet  effective now                   EDUCATION NEEDS:   No education needs have been identified at this time  Skin:  Skin Assessment: Reviewed RN Assessment  Last BM:  11/25/2020  Height:   Ht Readings from Last 1 Encounters:  11/25/20 5\' 1"  (1.549 m)    Weight:   Wt Readings from Last 1 Encounters:  11/25/20 72.4 kg    Ideal Body Weight:  47.7 kg  BMI:  Body mass index is 30.16 kg/m.  Estimated Nutritional Needs:   Kcal:  1650-1850  Protein:  85-100 grams  Fluid:  >/= 1.7  L    Theopolis Sloop BS, PLDN Clinical Dietitian See [facility] for contact information.

## 2020-11-28 DIAGNOSIS — E43 Unspecified severe protein-calorie malnutrition: Secondary | ICD-10-CM | POA: Insufficient documentation

## 2020-11-28 LAB — TYPE AND SCREEN
ABO/RH(D): B NEG
Antibody Screen: NEGATIVE

## 2020-11-28 MED ORDER — CHLORHEXIDINE GLUCONATE CLOTH 2 % EX PADS
6.0000 | MEDICATED_PAD | Freq: Once | CUTANEOUS | Status: AC
  Administered 2020-11-29: 6 via TOPICAL

## 2020-11-28 MED ORDER — VANCOMYCIN HCL IN DEXTROSE 1-5 GM/200ML-% IV SOLN
1000.0000 mg | INTRAVENOUS | Status: AC
  Administered 2020-11-29: 1000 mg via INTRAVENOUS
  Filled 2020-11-28: qty 200

## 2020-11-28 NOTE — Progress Notes (Signed)
Patient ID: Lindsey Daniels, female   DOB: [birth year], 65 y.o.   MRN: [id number] BP 140/71 (BP Location: Right Arm)   Pulse 82   Temp 97.6 F (36.4 C) (Oral)   Resp 15   Ht 5\' 1"  (1.549 m)   Wt 72.4 kg   SpO2 97%   BMI 30.16 kg/m  Alert and oriented x 4, at times confused Mild left drift Left facial droop, subtle Visual fields appear to be full on confrontation I have explained the risks and benefits of the planned craniotomy , stroke, coma, death, left field cut, left sided weakness, bleeding, infection, inability to remove the mass, brain damage, and other risks. She and her brother were present for this conversation and have agreed to proceed with the operation.  Moving all extremities, able to walk with assistance

## 2020-11-29 ENCOUNTER — Inpatient Hospital Stay (HOSPITAL_COMMUNITY): Payer: BC Managed Care – PPO | Admitting: Certified Registered Nurse Anesthetist

## 2020-11-29 ENCOUNTER — Encounter (HOSPITAL_COMMUNITY): Payer: Self-pay | Admitting: Neurosurgery

## 2020-11-29 ENCOUNTER — Encounter (HOSPITAL_COMMUNITY)
Admission: EM | Disposition: A | Discharge status: Home / Self Care | Payer: Self-pay | Source: Home / Self Care | Attending: Neurosurgery

## 2020-11-29 ENCOUNTER — Inpatient Hospital Stay (HOSPITAL_COMMUNITY): Payer: BC Managed Care – PPO

## 2020-11-29 HISTORY — PX: CRANIOTOMY: SHX93

## 2020-11-29 LAB — CREATININE, SERUM
Creatinine, Ser: 0.59 mg/dL (ref 0.44–1.00)
GFR, Estimated: >60 mL/min (ref >60)

## 2020-11-29 LAB — CBC
HCT: 32.8 % — ABNORMAL LOW (ref 36.0–46.0)
Hemoglobin: 11.5 g/dL — ABNORMAL LOW (ref 12.0–15.0)
MCH: 29.9 pg (ref 26.0–34.0)
MCHC: 35.1 g/dL (ref 30.0–36.0)
MCV: 85.4 fL (ref 80.0–100.0)
Platelets: 226 10*3/uL (ref 150–400)
RBC: 3.84 MIL/uL — ABNORMAL LOW (ref 3.87–5.11)
RDW: 12.3 % (ref 11.5–15.5)
WBC: 12.4 10*3/uL — ABNORMAL HIGH (ref 4.0–10.5)
nRBC: 0 % (ref 0.0–0.2)

## 2020-11-29 LAB — SURGICAL PCR SCREEN
MRSA, PCR: NEGATIVE
Staphylococcus aureus: NEGATIVE

## 2020-11-29 SURGERY — CRANIOTOMY TUMOR EXCISION
Anesthesia: General | Laterality: Right

## 2020-11-29 MED ORDER — MANNITOL 25 % IV SOLN
INTRAVENOUS | Status: DC | PRN
  Administered 2020-11-29: 37.5 g via INTRAVENOUS

## 2020-11-29 MED ORDER — POTASSIUM CHLORIDE IN NACL 20-0.9 MEQ/L-% IV SOLN
INTRAVENOUS | Status: DC
  Filled 2020-11-29 (×4): qty 1000

## 2020-11-29 MED ORDER — SODIUM CHLORIDE 0.9 % IV SOLN
INTRAVENOUS | Status: DC | PRN
  Administered 2020-11-29: 500 mg via INTRAVENOUS

## 2020-11-29 MED ORDER — LEVETIRACETAM IN NACL 500 MG/100ML IV SOLN
500.0000 mg | Freq: Two times a day (BID) | INTRAVENOUS | Status: DC
  Administered 2020-11-29 – 2020-11-30 (×2): 500 mg via INTRAVENOUS
  Filled 2020-11-29 (×2): qty 100

## 2020-11-29 MED ORDER — DEXAMETHASONE SODIUM PHOSPHATE 10 MG/ML IJ SOLN
INTRAMUSCULAR | Status: DC | PRN
  Administered 2020-11-29: 10 mg via INTRAVENOUS

## 2020-11-29 MED ORDER — FLEET ENEMA 7-19 GM/118ML RE ENEM: 1.0000 | ENEMA | Freq: Once | RECTAL | Status: DC | PRN

## 2020-11-29 MED ORDER — CHLORHEXIDINE GLUCONATE 0.12 % MT SOLN: 15.0000 mL | Freq: Once | OROMUCOSAL | Status: AC

## 2020-11-29 MED ORDER — ONDANSETRON HCL 4 MG/2ML IJ SOLN
INTRAMUSCULAR | Status: DC | PRN
Start: 2020-11-29 — End: 2020-11-29
  Administered 2020-11-29: 4 mg via INTRAVENOUS

## 2020-11-29 MED ORDER — DEXAMETHASONE 4 MG PO TABS
6.0000 mg | ORAL_TABLET | Freq: Four times a day (QID) | ORAL | Status: AC
  Administered 2020-11-29 – 2020-11-30 (×3): 6 mg via ORAL
  Filled 2020-11-29 (×3): qty 2

## 2020-11-29 MED ORDER — LIDOCAINE-EPINEPHRINE 1 %-1:100000 IJ SOLN
INTRAMUSCULAR | Status: AC
  Filled 2020-11-29: qty 1

## 2020-11-29 MED ORDER — MUPIROCIN 2 % EX OINT: 1.0000 "application " | TOPICAL_OINTMENT | Freq: Two times a day (BID) | CUTANEOUS | Status: DC

## 2020-11-29 MED ORDER — ROCURONIUM BROMIDE 10 MG/ML (PF) SYRINGE
PREFILLED_SYRINGE | INTRAVENOUS | Status: DC | PRN
  Administered 2020-11-29 (×2): 40 mg via INTRAVENOUS
  Administered 2020-11-29: 60 mg via INTRAVENOUS

## 2020-11-29 MED ORDER — FENTANYL CITRATE (PF) 250 MCG/5ML IJ SOLN
INTRAMUSCULAR | Status: AC
  Filled 2020-11-29: qty 5

## 2020-11-29 MED ORDER — THROMBIN 5000 UNITS EX SOLR
OROMUCOSAL | Status: DC | PRN
  Administered 2020-11-29: 5 mL via TOPICAL

## 2020-11-29 MED ORDER — HEPARIN SODIUM (PORCINE) 5000 UNIT/ML IJ SOLN: 5000.0000 [IU] | Freq: Three times a day (TID) | INTRAMUSCULAR | Status: DC

## 2020-11-29 MED ORDER — DEXAMETHASONE 4 MG PO TABS
4.0000 mg | ORAL_TABLET | Freq: Four times a day (QID) | ORAL | Status: AC
  Administered 2020-12-01 (×3): 4 mg via ORAL
  Filled 2020-11-29 (×3): qty 1

## 2020-11-29 MED ORDER — CHLORHEXIDINE GLUCONATE 0.12 % MT SOLN
OROMUCOSAL | Status: AC
  Administered 2020-11-29: 15 mL via OROMUCOSAL
  Filled 2020-11-29: qty 15

## 2020-11-29 MED ORDER — DEXAMETHASONE SODIUM PHOSPHATE 10 MG/ML IJ SOLN
INTRAMUSCULAR | Status: AC
  Filled 2020-11-29: qty 1

## 2020-11-29 MED ORDER — LIDOCAINE-EPINEPHRINE 1 %-1:100000 IJ SOLN
INTRAMUSCULAR | Status: DC | PRN
  Administered 2020-11-29: 6 mL

## 2020-11-29 MED ORDER — PANTOPRAZOLE SODIUM 40 MG IV SOLR: 40.0000 mg | Freq: Every day | INTRAVENOUS | Status: DC

## 2020-11-29 MED ORDER — THROMBIN 5000 UNITS EX SOLR
CUTANEOUS | Status: AC
  Filled 2020-11-29: qty 5000

## 2020-11-29 MED ORDER — THROMBIN 20000 UNITS EX SOLR
CUTANEOUS | Status: DC | PRN
  Administered 2020-11-29: 20 mL via TOPICAL

## 2020-11-29 MED ORDER — HYDROCODONE-ACETAMINOPHEN 5-325 MG PO TABS
1.0000 | ORAL_TABLET | ORAL | Status: DC | PRN
  Administered 2020-11-29 – 2020-11-30 (×2): 1 via ORAL
  Filled 2020-11-29 (×3): qty 1

## 2020-11-29 MED ORDER — ROCURONIUM BROMIDE 10 MG/ML (PF) SYRINGE
PREFILLED_SYRINGE | INTRAVENOUS | Status: AC
  Filled 2020-11-29: qty 10

## 2020-11-29 MED ORDER — PROPOFOL 10 MG/ML IV BOLUS
INTRAVENOUS | Status: AC
  Filled 2020-11-29: qty 40

## 2020-11-29 MED ORDER — AMISULPRIDE (ANTIEMETIC) 5 MG/2ML IV SOLN: 10.0000 mg | Freq: Once | INTRAVENOUS | Status: DC | PRN

## 2020-11-29 MED ORDER — BACITRACIN ZINC 500 UNIT/GM EX OINT
TOPICAL_OINTMENT | CUTANEOUS | Status: DC | PRN
  Administered 2020-11-29: 1 via TOPICAL

## 2020-11-29 MED ORDER — GADOBUTROL 1 MMOL/ML IV SOLN
7.0000 mL | Freq: Once | INTRAVENOUS | Status: AC | PRN
  Administered 2020-11-29: 7 mL via INTRAVENOUS

## 2020-11-29 MED ORDER — LABETALOL HCL 5 MG/ML IV SOLN
10.0000 mg | INTRAVENOUS | Status: DC | PRN
Start: 2020-11-29 — End: 2020-12-03
  Administered 2020-11-30: 20 mg via INTRAVENOUS
  Filled 2020-11-29: qty 4

## 2020-11-29 MED ORDER — ONDANSETRON HCL 4 MG/2ML IJ SOLN: 4.0000 mg | INTRAMUSCULAR | Status: DC | PRN

## 2020-11-29 MED ORDER — THROMBIN 20000 UNITS EX SOLR
CUTANEOUS | Status: AC
  Filled 2020-11-29: qty 20000

## 2020-11-29 MED ORDER — LIDOCAINE 2% (20 MG/ML) 5 ML SYRINGE
INTRAMUSCULAR | Status: AC
  Filled 2020-11-29: qty 5

## 2020-11-29 MED ORDER — SUGAMMADEX SODIUM 200 MG/2ML IV SOLN
INTRAVENOUS | Status: DC | PRN
Start: 2020-11-29 — End: 2020-11-29
  Administered 2020-11-29: 300 mg via INTRAVENOUS

## 2020-11-29 MED ORDER — LACTATED RINGERS IV SOLN: INTRAVENOUS | Status: DC

## 2020-11-29 MED ORDER — ORAL CARE MOUTH RINSE: 15.0000 mL | Freq: Once | OROMUCOSAL | Status: AC

## 2020-11-29 MED ORDER — ESMOLOL HCL 100 MG/10ML IV SOLN
INTRAVENOUS | Status: DC | PRN
  Administered 2020-11-29: 20 mg via INTRAVENOUS

## 2020-11-29 MED ORDER — ACETAMINOPHEN 10 MG/ML IV SOLN: 1000.0000 mg | Freq: Once | INTRAVENOUS | Status: DC | PRN

## 2020-11-29 MED ORDER — NALOXONE HCL 0.4 MG/ML IJ SOLN: 0.0800 mg | INTRAMUSCULAR | Status: DC | PRN

## 2020-11-29 MED ORDER — SENNOSIDES-DOCUSATE SODIUM 8.6-50 MG PO TABS: 1.0000 | ORAL_TABLET | Freq: Every evening | ORAL | Status: DC | PRN

## 2020-11-29 MED ORDER — PROSOURCE PLUS PO LIQD
30.0000 mL | Freq: Two times a day (BID) | ORAL | Status: DC
  Administered 2020-12-01 – 2020-12-03 (×4): 30 mL via ORAL
  Filled 2020-11-29 (×4): qty 30

## 2020-11-29 MED ORDER — BISACODYL 5 MG PO TBEC: 5.0000 mg | DELAYED_RELEASE_TABLET | Freq: Every day | ORAL | Status: DC | PRN

## 2020-11-29 MED ORDER — MORPHINE SULFATE (PF) 2 MG/ML IV SOLN: 1.0000 mg | INTRAVENOUS | Status: DC | PRN

## 2020-11-29 MED ORDER — CLEVIDIPINE BUTYRATE 0.5 MG/ML IV EMUL
INTRAVENOUS | Status: DC | PRN
Start: 2020-11-29 — End: 2020-11-29
  Administered 2020-11-29: 1 mg/h via INTRAVENOUS

## 2020-11-29 MED ORDER — LIDOCAINE 2% (20 MG/ML) 5 ML SYRINGE
INTRAMUSCULAR | Status: DC | PRN
  Administered 2020-11-29: 60 mg via INTRAVENOUS

## 2020-11-29 MED ORDER — PHENYLEPHRINE HCL-NACL 20-0.9 MG/250ML-% IV SOLN
INTRAVENOUS | Status: DC | PRN
  Administered 2020-11-29: 20 ug/min via INTRAVENOUS

## 2020-11-29 MED ORDER — 0.9 % SODIUM CHLORIDE (POUR BTL) OPTIME
TOPICAL | Status: DC | PRN
  Administered 2020-11-29: 2000 mL
  Administered 2020-11-29: 3000 mL

## 2020-11-29 MED ORDER — DOCUSATE SODIUM 100 MG PO CAPS
100.0000 mg | ORAL_CAPSULE | Freq: Two times a day (BID) | ORAL | Status: DC
  Administered 2020-11-29 – 2020-12-03 (×8): 100 mg via ORAL
  Filled 2020-11-29 (×8): qty 1

## 2020-11-29 MED ORDER — PROPOFOL 10 MG/ML IV BOLUS
INTRAVENOUS | Status: DC | PRN
  Administered 2020-11-29 (×2): 20 mg via INTRAVENOUS
  Administered 2020-11-29: 200 mg via INTRAVENOUS

## 2020-11-29 MED ORDER — FENTANYL CITRATE (PF) 250 MCG/5ML IJ SOLN
INTRAMUSCULAR | Status: DC | PRN
  Administered 2020-11-29: 100 ug via INTRAVENOUS
  Administered 2020-11-29: 50 ug via INTRAVENOUS
  Administered 2020-11-29: 100 ug via INTRAVENOUS
  Administered 2020-11-29: 50 ug via INTRAVENOUS
  Administered 2020-11-29: 100 ug via INTRAVENOUS

## 2020-11-29 MED ORDER — SODIUM CHLORIDE 0.9 % IV SOLN
INTRAVENOUS | Status: DC | PRN
Start: 2020-11-29 — End: 2020-11-29

## 2020-11-29 MED ORDER — ONDANSETRON HCL 4 MG/2ML IJ SOLN: 4.0000 mg | Freq: Once | INTRAMUSCULAR | Status: DC | PRN

## 2020-11-29 MED ORDER — DEXAMETHASONE 4 MG PO TABS
4.0000 mg | ORAL_TABLET | Freq: Three times a day (TID) | ORAL | Status: DC
  Administered 2020-12-01 – 2020-12-03 (×6): 4 mg via ORAL
  Filled 2020-11-29 (×6): qty 1

## 2020-11-29 MED ORDER — BACITRACIN ZINC 500 UNIT/GM EX OINT
TOPICAL_OINTMENT | CUTANEOUS | Status: AC
  Filled 2020-11-29: qty 28.35

## 2020-11-29 MED ORDER — PHENYLEPHRINE 40 MCG/ML (10ML) SYRINGE FOR IV PUSH (FOR BLOOD PRESSURE SUPPORT)
PREFILLED_SYRINGE | INTRAVENOUS | Status: AC
  Filled 2020-11-29: qty 10

## 2020-11-29 MED ORDER — MICROFIBRILLAR COLL HEMOSTAT EX PADS
MEDICATED_PAD | CUTANEOUS | Status: DC | PRN
  Administered 2020-11-29: 1 via TOPICAL

## 2020-11-29 MED ORDER — SODIUM CHLORIDE 0.9 % IR SOLN
Status: DC | PRN
  Administered 2020-11-29: 1000 mL

## 2020-11-29 MED ORDER — PROMETHAZINE HCL 25 MG PO TABS: 12.5000 mg | ORAL_TABLET | ORAL | Status: DC | PRN

## 2020-11-29 MED ORDER — ONDANSETRON HCL 4 MG/2ML IJ SOLN
INTRAMUSCULAR | Status: AC
  Filled 2020-11-29: qty 2

## 2020-11-29 MED ORDER — FENTANYL CITRATE (PF) 100 MCG/2ML IJ SOLN: 25.0000 ug | INTRAMUSCULAR | Status: DC | PRN

## 2020-11-29 MED ORDER — ONDANSETRON HCL 4 MG PO TABS: 4.0000 mg | ORAL_TABLET | ORAL | Status: DC | PRN

## 2020-11-29 SURGICAL SUPPLY — 93 items
APL SKNCLS STERI-STRIP NONHPOA (GAUZE/BANDAGES/DRESSINGS)
BAG COUNTER SPONGE SURGICOUNT (BAG) ×3 IMPLANT
BAG SPNG CNTER NS LX DISP (BAG) ×2
BAND INSRT 18 STRL LF DISP RB (MISCELLANEOUS) ×2
BAND RUBBER #18 3X1/16 STRL (MISCELLANEOUS) ×2 IMPLANT
BENZOIN TINCTURE PRP APPL 2/3 (GAUZE/BANDAGES/DRESSINGS) IMPLANT
BLADE CLIPPER SURG (BLADE) ×2 IMPLANT
BLADE SAW GIGLI 16 STRL (MISCELLANEOUS) IMPLANT
BLADE SURG 15 STRL LF DISP TIS (BLADE) IMPLANT
BLADE SURG 15 STRL SS (BLADE)
BLADE ULTRA TIP 2M (BLADE) IMPLANT
BNDG GAUZE ELAST 4 BULKY (GAUZE/BANDAGES/DRESSINGS) ×1 IMPLANT
BUR ACORN 6.0 PRECISION (BURR) ×2 IMPLANT
BUR MATCHSTICK NEURO 3.0 LAGG (BURR) IMPLANT
BUR SPIRAL ROUTER 2.3 (BUR) ×1 IMPLANT
CANISTER SUCT 3000ML PPV (MISCELLANEOUS) ×2 IMPLANT
CARTRIDGE OIL MAESTRO DRILL (MISCELLANEOUS) ×1 IMPLANT
CASSETTE SUCT IRRIG SONOPET IQ (MISCELLANEOUS) ×1 IMPLANT
CATH VENTRIC 35X38 W/TROCAR LG (CATHETERS) IMPLANT
CLIP VESOCCLUDE MED 6/CT (CLIP) IMPLANT
CNTNR URN SCR LID CUP LEK RST (MISCELLANEOUS) ×1 IMPLANT
CONT SPEC 4OZ STRL OR WHT (MISCELLANEOUS) ×4
DECANTER SPIKE VIAL GLASS SM (MISCELLANEOUS) ×2 IMPLANT
DIFFUSER DRILL AIR PNEUMATIC (MISCELLANEOUS) ×2 IMPLANT
DRAIN SUBARACHNOID (WOUND CARE) IMPLANT
DRAPE CAMERA VIDEO/LASER (DRAPES) IMPLANT
DRAPE MICROSCOPE LEICA (MISCELLANEOUS) IMPLANT
DRAPE NEUROLOGICAL W/INCISE (DRAPES) ×2 IMPLANT
DRAPE ORTHO SPLIT 77X108 STRL (DRAPES)
DRAPE SURG 17X23 STRL (DRAPES) IMPLANT
DRAPE SURG ORHT 6 SPLT 77X108 (DRAPES) IMPLANT
DRAPE WARM FLUID 44X44 (DRAPES) ×2 IMPLANT
DRSG TELFA 3X8 NADH (GAUZE/BANDAGES/DRESSINGS) ×4 IMPLANT
DURAPREP 6ML APPLICATOR 50/CS (WOUND CARE) ×2 IMPLANT
ELECT REM PT RETURN 9FT ADLT (ELECTROSURGICAL) ×2
ELECTRODE REM PT RTRN 9FT ADLT (ELECTROSURGICAL) ×1 IMPLANT
EVACUATOR 1/8 PVC DRAIN (DRAIN) IMPLANT
EVACUATOR SILICONE 100CC (DRAIN) IMPLANT
FORCEPS BIPOLAR SPETZLER 8 1.0 (NEUROSURGERY SUPPLIES) ×2 IMPLANT
GAUZE 4X4 16PLY ~~LOC~~+RFID DBL (SPONGE) ×2 IMPLANT
GAUZE SPONGE 4X4 12PLY STRL (GAUZE/BANDAGES/DRESSINGS) ×2 IMPLANT
GLOVE EXAM NITRILE XL STR (GLOVE) IMPLANT
GLOVE SRG 8 PF TXTR STRL LF DI (GLOVE) IMPLANT
GLOVE SURG LTX SZ6.5 (GLOVE) ×2 IMPLANT
GLOVE SURG UNDER POLY LF SZ7 (GLOVE) ×2 IMPLANT
GLOVE SURG UNDER POLY LF SZ7.5 (GLOVE) ×2 IMPLANT
GLOVE SURG UNDER POLY LF SZ8 (GLOVE) ×2
GOWN STRL REUS W/ TWL LRG LVL3 (GOWN DISPOSABLE) ×2 IMPLANT
GOWN STRL REUS W/ TWL XL LVL3 (GOWN DISPOSABLE) IMPLANT
GOWN STRL REUS W/TWL 2XL LVL3 (GOWN DISPOSABLE) IMPLANT
GOWN STRL REUS W/TWL LRG LVL3 (GOWN DISPOSABLE) ×4
GOWN STRL REUS W/TWL XL LVL3 (GOWN DISPOSABLE) ×2
GRAFT DURAGEN MATRIX 2WX2L ×1 IMPLANT
HEMOSTAT POWDER SURGIFOAM 1G (HEMOSTASIS) ×1 IMPLANT
HEMOSTAT SURGICEL 2X14 (HEMOSTASIS) ×1 IMPLANT
HOOK DURA 1/2IN (MISCELLANEOUS) ×2 IMPLANT
KIT BASIN OR (CUSTOM PROCEDURE TRAY) ×2 IMPLANT
KIT DRAIN CSF ACCUDRAIN (MISCELLANEOUS) IMPLANT
KIT TURNOVER KIT B (KITS) ×2 IMPLANT
KNIFE SONOPET IQ 11 APEX (ORTHOPEDIC DISPOSABLE SUPPLIES) ×1 IMPLANT
NDL HYPO 25X1 1.5 SAFETY (NEEDLE) ×1 IMPLANT
NDL SPNL 18GX3.5 QUINCKE PK (NEEDLE) IMPLANT
NEEDLE HYPO 25X1 1.5 SAFETY (NEEDLE) ×2 IMPLANT
NEEDLE SPNL 18GX3.5 QUINCKE PK (NEEDLE) IMPLANT
NS IRRIG 1000ML POUR BTL (IV SOLUTION) ×8 IMPLANT
OIL CARTRIDGE MAESTRO DRILL (MISCELLANEOUS) ×2
PACK CRANIOTOMY CUSTOM (CUSTOM PROCEDURE TRAY) ×2 IMPLANT
PAD DRESSING TELFA 3X8 NADH (GAUZE/BANDAGES/DRESSINGS) IMPLANT
PATTIES SURGICAL .25X.25 (GAUZE/BANDAGES/DRESSINGS) IMPLANT
PATTIES SURGICAL .5 X.5 (GAUZE/BANDAGES/DRESSINGS) IMPLANT
PATTIES SURGICAL .5 X3 (DISPOSABLE) IMPLANT
PATTIES SURGICAL 1/4 X 3 (GAUZE/BANDAGES/DRESSINGS) IMPLANT
PATTIES SURGICAL 1X1 (DISPOSABLE) IMPLANT
SCREW UNIII AXS SD 1.5X4 (Screw) ×8 IMPLANT
SPECIMEN JAR SMALL (MISCELLANEOUS) IMPLANT
SPONGE NEURO XRAY DETECT 1X3 (DISPOSABLE) IMPLANT
SPONGE SURGIFOAM ABS GEL 100 (HEMOSTASIS) ×2 IMPLANT
STAPLER VISISTAT 35W (STAPLE) ×2 IMPLANT
SUT ETHILON 3 0 FSL (SUTURE) IMPLANT
SUT ETHILON 3 0 PS 1 (SUTURE) IMPLANT
SUT NURALON 4 0 TR CR/8 (SUTURE) ×5 IMPLANT
SUT SILK 0 TIES 10X30 (SUTURE) IMPLANT
SUT STEEL 0 (SUTURE)
SUT STEEL 0 18XMFL TIE 17 (SUTURE) IMPLANT
SUT VIC AB 2-0 CT2 18 VCP726D (SUTURE) ×4 IMPLANT
TIP TISSUE SONOPET IQ STD 12 (TIP) ×1 IMPLANT
TOWEL GREEN STERILE (TOWEL DISPOSABLE) ×2 IMPLANT
TOWEL GREEN STERILE FF (TOWEL DISPOSABLE) ×2 IMPLANT
TRAY FOLEY MTR SLVR 14FR STAT (SET/KITS/TRAYS/PACK) ×1 IMPLANT
TRAY FOLEY MTR SLVR 16FR STAT (SET/KITS/TRAYS/PACK) ×1 IMPLANT
TUBE CONNECTING 12X1/4 (SUCTIONS) ×2 IMPLANT
UNDERPAD 30X36 HEAVY ABSORB (UNDERPADS AND DIAPERS) ×1 IMPLANT
WATER STERILE IRR 1000ML POUR (IV SOLUTION) ×2 IMPLANT

## 2020-11-29 NOTE — Anesthesia Preprocedure Evaluation (Addendum)
Anesthesia Evaluation  Patient identified by MRN, date of birth, ID bandGeneral Assessment Comment:Patient responds appropriately to commands and instructions  Reviewed: Allergy & Precautions, NPO status , Patient's Chart, lab work & pertinent test results  Airway Mallampati: II  TM Distance: >3 FB Neck ROM: Full    Dental no notable dental hx.    Pulmonary neg pulmonary ROS,    Pulmonary exam normal breath sounds clear to auscultation       Cardiovascular hypertension, Normal cardiovascular exam Rhythm:Regular Rate:Normal  ECG: rate 68. Normal sinus rhythm Left bundle branch block   Neuro/Psych Seizures -,  PSYCHIATRIC DISORDERS Anxiety Depression    GI/Hepatic negative GI ROS, Neg liver ROS,   Endo/Other  negative endocrine ROS  Renal/GU negative Renal ROS     Musculoskeletal  (+) Arthritis ,   Abdominal   Peds  Hematology HLD   Anesthesia Other Findings Brain tumor  Reproductive/Obstetrics                           Anesthesia Physical Anesthesia Plan  ASA: 4  Anesthesia Plan: General   Post-op Pain Management:    Induction: Intravenous  PONV Risk Score and Plan: 3 and Ondansetron, Dexamethasone and Treatment may vary due to age or medical condition  Airway Management Planned: Oral ETT  Additional Equipment: Arterial line  Intra-op Plan:   Post-operative Plan: Possible Post-op intubation/ventilation  Informed Consent: I have reviewed the patients History and Physical, chart, labs and discussed the procedure including the risks, benefits and alternatives for the proposed anesthesia with the patient or authorized representative who has indicated his/her understanding and acceptance.     Dental advisory given and Consent reviewed with POA  Plan Discussed with: CRNA  Anesthesia Plan Comments:        Anesthesia Quick Evaluation

## 2020-11-29 NOTE — Op Note (Signed)
11/29/2020  1:48 PM  PATIENT:  Lindsey Daniels  65 y.o. 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 She was positioned supine with her head on a horseshoe head rest. Her head was shaved,  was prepped, and was draped in a sterile manner. The existing incision was exposed. I opened the upside down u shaped incision with a 10 blade. I exposed the bone flap. I removed the screws and plates, then removed the bone flap using the ultrasonic bone remover.   I opened the dural flap and exposed the brain. I started removing tumor which was visible on the brain surface with suction and bipolar cautery. The tumor was greyish, slightly cystic and soft. No defined borders outside of a cystic space which was large and medial to the bulk of the tumor. Dr. Arnoldo Morale and I used microdissection to remove the deeper parts of the tumor.  Once satisfied with the resection we achieved hemostasis. I approximated the dural flap, and used duragen to cover the brain. I replaced the bone flap with plates and screws. I applied a sterile dressing. She was extubated and taken to the pacu.   PLAN OF CARE: Admit to inpatient   PATIENT DISPOSITION:  PACU - hemodynamically stable.   Delay start of Pharmacological VTE agent (>24hrs) due to surgical blood loss or risk of bleeding:  no

## 2020-11-29 NOTE — Transfer of Care (Signed)
Immediate Anesthesia Transfer of Care Note  Patient: Lindsey Daniels  Procedure(s) Performed: Right Parietal craniotomy for tumor resection (Right)  Patient Location: PACU  Anesthesia Type:General  Level of Consciousness: awake and alert   Airway & Oxygen Therapy: Patient Spontanous Breathing and Patient connected to face mask oxygen  Post-op Assessment: Report given to RN, Post -op Vital signs reviewed and stable, Patient moving all extremities X 4 and Patient able to stick tongue midline  Post vital signs: Reviewed and stable  Last Vitals:  Vitals Value Taken Time  BP 88/55 11/29/20 1353  Temp    Pulse 82 11/29/20 1348  Resp 14 11/29/20 1354  SpO2 100 % 11/29/20 1348  Vitals shown include unvalidated device data.  Last Pain:  Vitals:   11/29/20 0827  TempSrc: Oral  PainSc: 0-No pain         Complications: No notable events documented.

## 2020-11-29 NOTE — Progress Notes (Signed)
BP (!) 188/77   Pulse (!) 51   Temp (!) 97.5 F (36.4 C) (Oral)   Resp 17   Ht 5\' 1"  (1.549 m)   Wt 72.4 kg   SpO2 97%   BMI 30.16 kg/m  Alert and oriented x 4, confused but improved Moving all extremities, mild left facial droop, left drift Here for operative resection of the mass. She does understand, despite confusion and wishes to proceed

## 2020-11-29 NOTE — Anesthesia Procedure Notes (Signed)
Procedure Name: Intubation Date/Time: 11/29/2020 9:44 AM Performed by: Valda Favia, CRNA Pre-anesthesia Checklist: Patient identified, Emergency Drugs available, Suction available and Patient being monitored Patient Re-evaluated:Patient Re-evaluated prior to induction Oxygen Delivery Method: Circle System Utilized Preoxygenation: Pre-oxygenation with 100% oxygen Induction Type: IV induction Ventilation: Mask ventilation without difficulty Laryngoscope Size: Mac and 4 Grade View: Grade I Tube type: Oral Tube size: 7.5 mm Number of attempts: 1 Airway Equipment and Method: Stylet and Oral airway Placement Confirmation: ETT inserted through vocal cords under direct vision, positive ETCO2 and breath sounds checked- equal and bilateral Secured at: 22 cm Tube secured with: Tape Dental Injury: Teeth and Oropharynx as per pre-operative assessment

## 2020-11-29 NOTE — Anesthesia Procedure Notes (Signed)
Arterial Line Insertion Start/End10/14/2022 9:00 AM, 11/29/2020 9:10 AM Performed by: Murvin Natal, MD, anesthesiologist  Patient location: Pre-op. Preanesthetic checklist: patient identified, IV checked, site marked, risks and benefits discussed, surgical consent, monitors and equipment checked, pre-op evaluation, timeout performed and anesthesia consent Lidocaine 1% used for infiltration Left, radial was placed Catheter size: 20 G Hand hygiene performed , maximum sterile barriers used  and Seldinger technique used  Attempts: 1 (Previous attempts by CRNA) Procedure performed using ultrasound guided technique. Ultrasound Notes:anatomy identified, needle tip was noted to be adjacent to the nerve/plexus identified and no ultrasound evidence of intravascular and/or intraneural injection Following insertion, dressing applied and Biopatch. Post procedure assessment: normal and unchanged  Post procedure complications: second provider assisted. Patient tolerated the procedure well with no immediate complications.

## 2020-11-30 MED ORDER — LEVETIRACETAM 500 MG PO TABS
500.0000 mg | ORAL_TABLET | Freq: Two times a day (BID) | ORAL | Status: DC
  Administered 2020-11-30 – 2020-12-03 (×6): 500 mg via ORAL
  Filled 2020-11-30 (×6): qty 1

## 2020-11-30 NOTE — Progress Notes (Signed)
Patient ID: Lindsey Daniels, female   DOB: [birth year], 65 y.o.   MRN: [id number] BP (!) 152/71   Pulse (!) 53   Temp 98 F (36.7 C) (Axillary)   Resp 15   Ht 5\' 1"  (1.549 m)   Wt 72.4 kg   SpO2 96%   BMI 30.16 kg/m  Films reviewed. Possibility of return to remove residual tumor. Would like to wait for final pathology to inform decision.

## 2020-11-30 NOTE — Progress Notes (Signed)
Report called to 4NP RN at this time. Tabitha, NT will take patient to room in wheelchair.   Montez Hageman RN

## 2020-11-30 NOTE — Progress Notes (Signed)
Subjective: The patient is alert and pleasant.  She looks well.  She has no complaints.  Objective: Vital signs in last 24 hours: Temp:  [97.4 F (36.3 C)-98 F (36.7 C)] 98 F (36.7 C) (10/15 0800) Pulse Rate:  [44-94] 53 (10/15 0800) Resp:  [10-15] 15 (10/15 0800) BP: (101-163)/(59-81) 144/74 (10/15 0800) SpO2:  [92 %-100 %] 95 % (10/15 0800) Arterial Line BP: (122-156)/(51-76) 156/71 (10/14 1600) Estimated body mass index is 30.16 kg/m as calculated from the following:   Height as of this encounter: 5\' 1"  (1.549 m).   Weight as of this encounter: 72.4 kg.   Intake/Output from previous day: 10/14 0701 - 10/15 0700 In: 3168.7 [I.V.:3068.7; IV Piggyback:100] Out: 2100 [Urine:1900; Blood:200] Intake/Output this shift: No intake/output data recorded.  Physical exam the patient is alert and oriented x2, person and [facility].  She thought it was November.  Her speech is normal.  Her strength is normal.  Her dressing is clean and dry.  Lab Results: Recent Labs    11/29/20 1530  WBC 12.4*  HGB 11.5*  HCT 32.8*  PLT 226   BMET Recent Labs    11/29/20 1530  CREATININE 0.59    Studies/Results: MR BRAIN W WO CONTRAST  Result Date: 11/29/2020 CLINICAL DATA:  CNS neoplasm surveillance, status post tumor section EXAM: MRI HEAD WITHOUT AND WITH CONTRAST TECHNIQUE: Multiplanar, multiecho pulse sequences of the brain and surrounding structures were obtained without and with intravenous contrast. CONTRAST:  63mL GADAVIST GADOBUTROL 1 MMOL/ML IV SOLN COMPARISON:  11/26/2020, 08/27/2018. FINDINGS: Brain: Status post interval right temporoparietal craniotomy and resection of the subjacent enhancing mass in the right temporal lobe. Residual hyperenhancing tissue with cystic areas is seen in the superomedial and inferolateral aspects of the resection cavity, measuring approximately 3.1 x 2.2 x 1.5 cm (AP x TR x CC) (series 18, image 20 and series 20, image 4) at the inferior  aspect and approximately 1.0 x 2.0 x 1.0 cm at the superior aspect (series 18, image 26 and series 20, image 4); previously the mass measured approximately 4.5 x 4.8 x 3.9 cm in total. Intrinsic T1 hyperintense signal about the resection cavity, likely postsurgical. Surrounding T2 hyperintense signal extends from the right temporal lobe posteriorly to the occipital lobe and superiorly to the frontal and parietal lobes, as well as medially into the right basal ganglia and thalamus. Approximately 10 mm of right-to-left midline shift, overall unchanged from the prior exam. No distant acute infarct. Fluid collection subjacent to the craniotomy flap, not unexpected post surgically. Vascular: Normal flow voids. Skull and upper cervical spine: Status post right temporoparietal craniotomy. Otherwise normal marrow signal. Sinuses/Orbits: No acute finding. Status post bilateral lens replacements. Other: Fluid throughout the right mastoid air cells. IMPRESSION: Status post interval resection of a previously noted mass centered in the right temporal lobe, with enhancing tissue with cystic areas remaining at the superior and inferior aspect of the resection cavity, most likely residual tumor. Overall unchanged 10 mm of right-to-left midline shift. Electronically Signed   By: Merilyn Baba M.D.   On: 11/29/2020 19:28    Assessment/Plan: Status post [place] for tumor: The patient is doing very well.  We will DC her Foley and mobilize her.  She is okay for the progressive unit.  LOS: 5 days     Ophelia Charter 11/30/2020, 8:53 AM     Patient ID: Lindsey Daniels, female   DOB: 1956-02-10, 65 y.o.   MRN: [id number]

## 2020-12-01 NOTE — Anesthesia Postprocedure Evaluation (Signed)
Anesthesia Post Note  Patient: Lindsey Daniels  Procedure(s) Performed: Right Parietal craniotomy for tumor resection (Right)     Patient location during evaluation: PACU Anesthesia Type: General Level of consciousness: awake Pain management: pain level controlled Vital Signs Assessment: post-procedure vital signs reviewed and stable Respiratory status: spontaneous breathing, nonlabored ventilation, respiratory function stable and patient connected to nasal cannula oxygen Cardiovascular status: blood pressure returned to baseline and stable Postop Assessment: no apparent nausea or vomiting Anesthetic complications: no   No notable events documented.  Last Vitals:  Vitals:   12/01/20 1615 12/01/20 1959  BP: 114/66 140/79  Pulse: 61 (!) 57  Resp: 19 19  Temp: 36.6 C 36.7 C  SpO2: 97% 98%    Last Pain:  Vitals:   12/01/20 1959  TempSrc: Oral  PainSc:                  Karyl Kinnier Georgann Bramble

## 2020-12-01 NOTE — Progress Notes (Addendum)
Subjective: The patient is alert and pleasant.  She is in no apparent distress.  She would like to go home.  Objective: Vital signs in last 24 hours: Temp:  [97.4 F (36.3 C)-98.6 F (37 C)] 97.7 F (36.5 C) (10/16 0857) Pulse Rate:  [54-68] 54 (10/16 0857) Resp:  [14-18] 15 (10/16 0857) BP: (126-177)/(51-74) 177/74 (10/16 0857) SpO2:  [94 %-100 %] 97 % (10/16 0857) Estimated body mass index is 30.16 kg/m as calculated from the following:   Height as of this encounter: 5\' 1"  (1.549 m).   Weight as of this encounter: 72.4 kg.   Intake/Output from previous day: 10/15 0701 - 10/16 0700 In: 1553.4 [I.V.:1453.6; IV Piggyback:99.8] Out: 600 [Urine:600] Intake/Output this shift: No intake/output data recorded.  Physical exam patient is alert and pleasant.  She is oriented x3.  Her speech and strength is normal.  Her dressing is clean and dry.  Lab Results: Recent Labs    11/29/20 1530  WBC 12.4*  HGB 11.5*  HCT 32.8*  PLT 226   BMET Recent Labs    11/29/20 1530  CREATININE 0.59    Studies/Results: MR BRAIN W WO CONTRAST  Result Date: 11/29/2020 CLINICAL DATA:  CNS neoplasm surveillance, status post tumor section EXAM: MRI HEAD WITHOUT AND WITH CONTRAST TECHNIQUE: Multiplanar, multiecho pulse sequences of the brain and surrounding structures were obtained without and with intravenous contrast. CONTRAST:  95mL GADAVIST GADOBUTROL 1 MMOL/ML IV SOLN COMPARISON:  11/26/2020, 08/27/2018. FINDINGS: Brain: Status post interval right temporoparietal craniotomy and resection of the subjacent enhancing mass in the right temporal lobe. Residual hyperenhancing tissue with cystic areas is seen in the superomedial and inferolateral aspects of the resection cavity, measuring approximately 3.1 x 2.2 x 1.5 cm (AP x TR x CC) (series 18, image 20 and series 20, image 4) at the inferior aspect and approximately 1.0 x 2.0 x 1.0 cm at the superior aspect (series 18, image 26 and series 20, image 4);  previously the mass measured approximately 4.5 x 4.8 x 3.9 cm in total. Intrinsic T1 hyperintense signal about the resection cavity, likely postsurgical. Surrounding T2 hyperintense signal extends from the right temporal lobe posteriorly to the occipital lobe and superiorly to the frontal and parietal lobes, as well as medially into the right basal ganglia and thalamus. Approximately 10 mm of right-to-left midline shift, overall unchanged from the prior exam. No distant acute infarct. Fluid collection subjacent to the craniotomy flap, not unexpected post surgically. Vascular: Normal flow voids. Skull and upper cervical spine: Status post right temporoparietal craniotomy. Otherwise normal marrow signal. Sinuses/Orbits: No acute finding. Status post bilateral lens replacements. Other: Fluid throughout the right mastoid air cells. IMPRESSION: Status post interval resection of a previously noted mass centered in the right temporal lobe, with enhancing tissue with cystic areas remaining at the superior and inferior aspect of the resection cavity, most likely residual tumor. Overall unchanged 10 mm of right-to-left midline shift. Electronically Signed   By: Merilyn Baba M.D.   On: 11/29/2020 19:28    Assessment/Plan: Postop day #2: The patient is doing well.  I think we should keep her in the hospital another day because she lives "with her dog".  I have answered all her questions.  I will transfer her to the floor.  LOS: 6 days     Ophelia Charter 12/01/2020, 9:20 AM     Patient ID: Lindsey Daniels, female   DOB: 06-22-1955, 64 y.o.   MRN: [id number]

## 2020-12-02 ENCOUNTER — Encounter (HOSPITAL_COMMUNITY): Payer: Self-pay | Admitting: Neurosurgery

## 2020-12-02 NOTE — Progress Notes (Signed)
Patient ID: Lindsey Daniels, female   DOB: [birth year], 65 y.o.   MRN: [id number] BP 139/79 (BP Location: Left Arm)   Pulse 64   Temp 97.6 F (36.4 C) (Oral)   Resp 16   Ht 5\' 1"  (1.549 m)   Wt 72.4 kg   SpO2 99%   BMI 30.16 kg/m  Alert and oriented x 4 Moving all extremities well Confused at times Speech is clear, fluent May discharge tomorrow. If redo surgery needed can always readmit. Will need final pathology before making decision.

## 2020-12-03 LAB — SURGICAL PATHOLOGY

## 2020-12-03 MED ORDER — DEXAMETHASONE 4 MG PO TABS: 4.0000 mg | ORAL_TABLET | Freq: Two times a day (BID) | ORAL | 0 refills | Status: AC

## 2020-12-03 NOTE — Discharge Summary (Signed)
Physician Discharge Summary  Patient ID: Lindsey Daniels MRN: [id number] DOB/AGE: 66-Mar-1957 66 y.o.  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 Post op she is alert and oriented. Occasionally confused, no drift, moving all extremities well. Wound is clean, dry, and without signs of infection. The wound should be was daily with shampoo, however the incision should not be scrubbed.   Treatments: surgery: repeat craniotomy for tumor resection, right  Discharge Exam: Blood pressure 128/74, pulse 63, temperature 97.6 F (36.4 C), temperature source Oral, resp. rate 17, height 5\' 1"  (1.549 m), weight 72.4 kg, SpO2 95 %. General appearance: alert, cooperative, appears stated age, and no distress Neurologic: Alert and oriented X 3, normal strength and tone. Normal symmetric reflexes. Normal coordination and gait Mental status: Alert, oriented, thought content appropriate, orientation: time, date, person, place  Disposition: Discharge disposition: 01-Home or Self Care      Brain tumor  Allergies as of 12/03/2020       Reactions   Rifapentine Other (See Comments)   Flu-like symptoms   Amoxicillin Rash   Clavulanic Acid Rash        Medication List     STOP taking these medications    meclizine 25 MG tablet Commonly known as: ANTIVERT       TAKE these medications    atorvastatin 20 MG tablet Commonly known as: LIPITOR Take 1 tablet (20 mg total) by mouth daily.   Cetirizine HCl 10 MG Caps Take 1 capsule (10 mg total) by mouth daily for 10 days.   cholecalciferol 1000 units  tablet Commonly known as: VITAMIN D Take 1,000 Units by mouth daily.   cyclobenzaprine 10 MG tablet Commonly known as: FLEXERIL TAKE 1 TABLET (10 MG TOTAL) BY MOUTH AT BEDTIME. What changed:  when to take this reasons to take this   dexamethasone 4 MG tablet Commonly known as: DECADRON Take 1 tablet (4 mg total) by mouth 2 (two) times daily with a meal for 5 days.   doxycycline 100 MG capsule Commonly known as: VIBRAMYCIN Take 1 capsule (100 mg total) by mouth 2 (two) times daily.   fluticasone 50 MCG/ACT nasal spray Commonly known as: FLONASE Place 1-2 sprays into both nostrils daily.   levETIRAcetam 500 MG tablet Commonly known as: KEPPRA Take 1 tablet (500 mg total) by mouth 2 (two) times daily.   meloxicam 15 MG tablet Commonly known as: MOBIC Take 15 mg by mouth 2 (two) times daily as needed for pain.   multivitamin with minerals Tabs tablet Take 1 tablet by mouth at bedtime.   omeprazole 40 MG capsule Commonly known as: PRILOSEC TAKE 1 CAPSULE (40 MG TOTAL) BY MOUTH DAILY BEFORE SUPPER. What changed:  when to take this reasons to take this   sertraline 100 MG tablet Commonly known as: ZOLOFT Take 1 tablet (100 mg total) by mouth daily.   triamcinolone 55 MCG/ACT Aero nasal inhaler Commonly known as: NASACORT Place 2 sprays into the nose at bedtime. In to each nostril   valACYclovir 500 MG tablet Commonly known as: VALTREX Take 1 tablet (500 mg total) by mouth 2 (two) times daily. Takes only if needed for breakouts What  changed:  when to take this reasons to take this additional instructions        Follow-up Information     Ashok Pall, MD Follow up in 1 week(s).   Specialty: Neurosurgery Why: please call to make an appointment Contact information: [address] [place] [address] [phone]                 Signed: Ashok Pall 12/03/2020, 2:48 PM

## 2020-12-03 NOTE — Progress Notes (Signed)
Nutrition Follow-up  DOCUMENTATION CODES:   Severe malnutrition in context of acute illness/injury  INTERVENTION:   - MVI with minerals daily  - Boost Breeze po BID, each supplement provides 250 kcal and 9 grams of protein  - ProSource Plus 30 ml po BID, each supplement provides 100 kcal and 15 grams of protein  - Encourage PO intake  NUTRITION DIAGNOSIS:   Severe Malnutrition related to acute illness (Brain Tumor) as evidenced by energy intake < or equal to 50% for > or equal to 5 days, percent weight loss, mild muscle depletion.  Ongoing, being addressed via oral nutrition supplements  GOAL:   Patient will meet greater than or equal to 90% of their needs  Progressing  MONITOR:   PO intake, Weight trends, I & O's, Supplement acceptance  REASON FOR ASSESSMENT:   Malnutrition Screening Tool    ASSESSMENT:   65 y.o. female presented to [facility] ED with confusion, dizziness, and headaches noticed by friends. PMH includes brain tumor resection August 2015 and HTN. Pt transferred to [facility] post- head CT finding of mass, right-to-left shift, and cerebral edema.  10/14 - s/p R parietal craniotomy for tumor resection  No new weights since 10/10. Pt with variable PO intake and variable acceptance of supplements per [facility] documentation. Pt accepting >90% of ProSource Plus supplements and ~50% of Boost Breeze supplements per [facility] documentation.  Attempted to speak with pt in room; however, pt sleeping soundly and did not awaken to RD voice. RD noted 100% completed lunch meal tray at bedside which provided 490 kcal and 24 grams of protein.  Meal Completion: 25-100%  Medications reviewed and include: ProSource Plus BID, cholecalciferol, decadron, colace, Boost Breeze BID, MVI with minerals, protonix, senna  Labs reviewed.  Diet Order:   Diet Order             Diet regular Room service appropriate? Yes; Fluid consistency: Thin  Diet effective now                    EDUCATION NEEDS:   No education needs have been identified at this time  Skin:  Skin Assessment: Skin Integrity Issues: Incisions: R head  Last BM:  11/29/20  Height:   Ht Readings from Last 1 Encounters:  11/25/20 5\' 1"  (1.549 m)    Weight:   Wt Readings from Last 1 Encounters:  11/25/20 72.4 kg    Ideal Body Weight:  47.7 kg  BMI:  Body mass index is 30.16 kg/m.  Estimated Nutritional Needs:   Kcal:  1650-1850  Protein:  85-100 grams  Fluid:  >/= 1.7 L    Gustavus Bryant, MS, RD, LDN Inpatient Clinical Dietitian Please see AMiON for contact information.

## 2020-12-03 NOTE — Discharge Instructions (Signed)
Craniotomy °Care After °Please read the instructions outlined below and refer to this sheet in the next few weeks. These discharge instructions provide you with general information on caring for yourself after you leave the hospital. Your surgeon may also give you specific instructions. While your treatment has been planned according to the most current medical practices available, unavoidable complications occasionally occur. If you have any problems or questions after discharge, please call your surgeon. °Although there are many types of brain surgery, recovery following craniotomy (surgical opening of the skull) is much the same for each. However, recovery depends on many factors. These include the type and severity of brain injury and the type of surgery. It also depends on any nervous system function problems (neurological deficits) before surgery. If the craniotomy was done for cancer, chemotherapy and radiation could follow. You could be in the hospital from 5 days to a couple weeks. This depends on the type of surgery, findings, and whether there are complications. °HOME CARE INSTRUCTIONS  °· It is not unusual to hear a clicking noise after a craniotomy, the plates and screws used to attach the bone flap can sometimes cause this. It is a normal occurrence if this does happen °· Do not drive for 10 days after the operation °· Your scalp may feel spongy for a while, because of fluid under it. This will gradually get better. Occasionally, the surgeon will not replace the bone that was removed to access the brain. If there is a bony defect, the surgeon will ask you to wear a helmet for protection. This is a discussion you should have with your surgeon prior to leaving the hospital (discharge). °· Numbness may persist in some areas of your scalp. °· Take all medications as directed. Sometimes steroids to control swelling are prescribed. Anticonvulsants to prevent seizures may also be given. Do not use alcohol,  other drugs, or medications unless your surgeon says it is OK. °· Keep the wound dry and clean. The wound may be washed gently with soap and water. Then, you may gently blot or dab it dry, without rubbing. Do not take baths, use swimming pools or hot tubs for 10 days, or as instructed by your caregiver. It is best to wait to see you surgeon at your first postoperative visit, and to get directions at that time. °· Only take over-the-counter or prescription medicines for pain, discomfort, or fever as directed by your caregiver. °· You may continue your normal diet, as directed. °· Walking is OK for exercise. Wait at least 3 months before you return to mild, non-contact sports or as your surgeon suggests. Contact sports should be avoided for at least 1 year, unless your surgeon says it is OK. °· If you are prescribed steroids, take them exactly as prescribed. If you start having a decrease in nervous system functions (neurological deficits) and headaches as the dose of steroids is reduced, tell your surgeon right away. °· When the anticonvulsant prescription is finished you no longer need to take it. °SEEK IMMEDIATE MEDICAL CARE IF:  °· You develop nausea, vomiting, severe headaches, confusion, or you have a seizure. °· You develop chest pain, a stiff neck, or difficulty breathing. °· There is redness, swelling, or increasing pain in the wound or pin insertion sites. °· You have an increase in swelling or bruising around the eyes. °· There is drainage or pus coming from the wound. °· You have an oral temperature above 102° F (38.9° C), not controlled by medicine. °·   You notice a foul smell coming from the wound or dressing. °· The wound breaks open (edges not staying together) after the stitches have been removed. °· You develop dizziness or fainting while standing. °· You develop a rash. °· You develop any reaction or side effects to the medications given. °Document Released: 05/05/2005 Document Revised: 04/27/2011  Document Reviewed: 02/11/2009 °ExitCare® Patient Information ©2013 [employer]. ° °

## 2020-12-27 DIAGNOSIS — Z860100 Personal history of colon polyps, unspecified: Secondary | ICD-10-CM | POA: Insufficient documentation

## 2021-01-14 ENCOUNTER — Other Ambulatory Visit: Payer: Self-pay | Admitting: Radiation Therapy

## 2021-01-21 ENCOUNTER — Other Ambulatory Visit: Payer: Self-pay | Admitting: Radiation Therapy

## 2021-01-21 DIAGNOSIS — C719 Malignant neoplasm of brain, unspecified: Secondary | ICD-10-CM

## 2021-01-22 ENCOUNTER — Telehealth: Payer: Self-pay | Admitting: Internal Medicine

## 2021-01-22 NOTE — Telephone Encounter (Signed)
Scheduled appt per 12/6 referral. Pt is aware of appt date and time.

## 2021-01-30 ENCOUNTER — Inpatient Hospital Stay: Payer: BC Managed Care – PPO | Attending: Internal Medicine | Admitting: Internal Medicine

## 2021-01-30 ENCOUNTER — Other Ambulatory Visit: Payer: Self-pay

## 2021-01-30 VITALS — BP 124/84 | HR 87 | Temp 97.5°F | Resp 20 | Wt 167.3 lb

## 2021-01-30 DIAGNOSIS — C711 Malignant neoplasm of frontal lobe: Secondary | ICD-10-CM | POA: Diagnosis not present

## 2021-01-30 DIAGNOSIS — C719 Malignant neoplasm of brain, unspecified: Secondary | ICD-10-CM

## 2021-01-30 NOTE — Progress Notes (Signed)
[facility] at [place], [place] [address] [phone]   New Patient Evaluation  Date of Service: 01/30/21 Patient Name: Lindsey Daniels Patient MRN: [id number] Patient DOB: 11/18/1955 Provider: Ventura Sellers, MD  Identifying Statement:  Lindsey Daniels is a 65 y.o. female with right temporal  pleomorphic xanthoastrocytoma  who presents for initial consultation and evaluation.    Referring Provider: Harrison Mons, [address] [place],  [place] [address]  Oncologic History: Oncology History  Pleomorphic xanthoastrocytoma [facility])  09/29/2013 Surgery   Right temporal craniotomy, resection with Dr. Sherwood Gambler.  Path is PXA WHO II.   11/29/2020 Surgery   Disease progression, second craniotomy with Dr. Christella Noa, sub-total resection.  Path is unchanged.     Biomarkers:  MGMT Unknown.  IDH 1/2 Unknown.  EGFR Unknown  TERT Unknown   History of Present Illness: The patient's records from the referring physician were obtained and reviewed and the patient interviewed to confirm this HPI.  Lindsey Daniels presents today after recent re-resection for progressive right temporal PXA.  She initially presented with a seizure in 2015; imaging demonstrated enhancing right temporal lesion which was grossly resected by Dr. Sherwood Gambler.  After last follow up in 2020, she began to experience headaches, ear fullness and some confusion this fall.  Imaging demonstrated marked progression of disease; she went for second surgery on 11/29/20.  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   Medications: Current Outpatient Medications on File Prior to Visit  Medication Sig Dispense Refill   atorvastatin (LIPITOR) 20 MG tablet Take 1 tablet (20 mg total) by mouth daily. 90 tablet 3   cholecalciferol (VITAMIN D) 1000 units tablet Take 1,000 Units by mouth daily.      fluticasone (FLONASE) 50 MCG/ACT nasal spray Place 1-2 sprays into both nostrils daily. 16 g 0   levETIRAcetam (KEPPRA) 500 MG tablet Take 1 tablet (500 mg total) by mouth 2 (two) times daily. 60 tablet 0   meloxicam (MOBIC) 15 MG tablet Take 15 mg by mouth 2 (two) times daily as needed for pain.     Multiple Vitamin (MULTIVITAMIN WITH MINERALS) TABS tablet Take 1 tablet by mouth at bedtime.     sertraline (ZOLOFT) 100 MG tablet Take 1 tablet (100 mg total) by mouth daily. 90 tablet 3   triamcinolone (NASACORT) 55 MCG/ACT AERO nasal inhaler Place 2 sprays into the nose at bedtime. In to each nostril 1 Inhaler 12   valACYclovir (VALTREX) 500 MG tablet Take 1 tablet (500 mg total) by mouth 2 (two) times daily. Takes only if needed for breakouts (Patient taking differently: Take 500 mg by mouth 2 (two) times daily as needed (For breakouts).) 30 tablet prn   Cetirizine HCl 10 MG CAPS Take 1 capsule (10 mg total) by mouth daily for 10 days. 10 capsule 0   cyclobenzaprine (FLEXERIL) 10 MG tablet TAKE 1 TABLET (10 MG TOTAL) BY MOUTH AT BEDTIME. (Patient taking differently: Take 10 mg by mouth at bedtime as needed (arthritic pain & stiffness).) 30 tablet 0   doxycycline (VIBRAMYCIN) 100 MG capsule Take 1 capsule (100 mg total) by mouth 2 (two) times daily. 20 capsule 0   omeprazole (PRILOSEC) 40 MG capsule TAKE 1 CAPSULE (40 MG TOTAL) BY MOUTH DAILY BEFORE SUPPER. (Patient not taking: Reported on 01/30/2021) 90 capsule 3   No current facility-administered medications  on file prior to visit.    Allergies:  Allergies  Allergen Reactions   Rifapentine Other (See Comments)    Flu-like symptoms   Amoxicillin Rash   Clavulanic Acid Rash   Past Medical History:  Past Medical History:  Diagnosis Date   Allergy    Anal fissure    Anxiety    Cataract    Depression    Elevated LFTs    Hyperglycemia    Hyperlipidemia    Hypertension    Osteoarthritis    Pars defect of lumbar spine    Seizures ([facility])     Spondylolisthesis    lumbar   Past Surgical History:  Past Surgical History:  Procedure Laterality Date   cataract     CRANIOTOMY N/A 10/02/2013   Procedure: 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;  Surgeon: Hosie Spangle, MD;  Location: [place] [facility];  Service: Neurosurgery;  Laterality: N/A;  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   CRANIOTOMY Right 11/29/2020   Procedure: Right Parietal craniotomy for tumor resection;  Surgeon: Ashok Pall, MD;  Location: [facility];  Service: Neurosurgery;  Laterality: Right;   HEEL SPUR SURGERY     Social History:  Social History   Socioeconomic History   Marital status: Divorced    Spouse name: separated-no papers file   Number of children: 0   Years of education: Not on file   Highest education level: Not on file  Occupational History   Occupation: RT    Employer: [employer]: [employer] and [employer]  Tobacco Use   Smoking status: Never   Smokeless tobacco: Never  Substance and Sexual Activity   Alcohol use: Yes    Alcohol/week: 0.0 - 4.0 standard drinks    Comment: social   Drug use: No   Sexual activity: Not Currently    Partners: Male  Other Topics Concern   Not on file  Social History Narrative   Separated from second husband.  Very contentious split.   Social Determinants of Health   Financial Resource Strain: Not on file  Food Insecurity: Not on file  Transportation Needs: Not on file  Physical Activity: Not on file  Stress: Not on file  Social Connections: Not on file  Intimate Partner Violence: Not on file   Family History:  Family History  Problem Relation Age of Onset   Mental illness Father        depression   COPD Father    Alcohol abuse Brother    Breast cancer Neg Hx     Review of Systems: Constitutional: Doesn't report fevers, chills or abnormal weight loss Eyes: Doesn't report blurriness of vision Ears, nose, mouth, throat, and  face: Doesn't report sore throat Respiratory: Doesn't report cough, dyspnea or wheezes Cardiovascular: Doesn't report palpitation, chest discomfort  Gastrointestinal:  Doesn't report nausea, constipation, diarrhea GU: Doesn't report incontinence Skin: Doesn't report skin rashes Neurological: Per HPI Musculoskeletal: Doesn't report joint pain Behavioral/Psych: Doesn't report anxiety  Physical Exam: Vitals:   01/30/21 0900  BP: 124/84  Pulse: 87  Resp: 20  Temp: (!) 97.5 F (36.4 C)  SpO2: 99%   KPS: 90. General: Alert, cooperative, pleasant, in no acute distress Head: Normal EENT: No conjunctival injection or scleral icterus.  Lungs: Resp effort normal Cardiac: Regular rate Abdomen: Non-distended abdomen Skin: No rashes cyanosis or petechiae. Extremities: No clubbing or edema  Neurologic Exam: Mental Status: Awake, alert, attentive to examiner. Oriented  to self and environment. Language is fluent with intact comprehension.  Cranial Nerves: Visual acuity is grossly normal. Visual fields are full. Extra-ocular movements intact. No ptosis. Face is symmetric Motor: Tone and bulk are normal. Power is full in both arms and legs. Reflexes are symmetric, no pathologic reflexes present.  Sensory: Intact to light touch Gait: Normal.   Labs: I have reviewed the data as listed    Component Value Date/Time   NA 138 11/25/2020 1118   NA 142 06/30/2017 1815   K 3.3 (L) 11/25/2020 1118   CL 103 11/25/2020 1118   CO2 28 11/25/2020 1118   GLUCOSE 109 (H) 11/25/2020 1118   BUN 12 11/25/2020 1118   BUN 14 06/30/2017 1815   CREATININE 0.59 11/29/2020 1530   CREATININE 0.72 06/07/2015 0825   CALCIUM 9.4 11/25/2020 1118   PROT 7.0 06/30/2017 1815   ALBUMIN 4.6 06/30/2017 1815   AST 22 06/30/2017 1815   ALT 17 06/30/2017 1815   ALKPHOS 89 06/30/2017 1815   BILITOT 0.6 06/30/2017 1815   GFRNONAA >60 11/29/2020 1530   GFRAA 108 06/30/2017 1815   Lab Results  Component Value Date    WBC 12.4 (H) 11/29/2020   NEUTROABS 3.8 06/30/2017   HGB 11.5 (L) 11/29/2020   HCT 32.8 (L) 11/29/2020   MCV 85.4 11/29/2020   PLT 226 11/29/2020    Imaging: CLINICAL DATA:  CNS neoplasm surveillance, status post tumor section   EXAM: MRI HEAD WITHOUT AND WITH CONTRAST   TECHNIQUE: Multiplanar, multiecho pulse sequences of the brain and surrounding structures were obtained without and with intravenous contrast.   CONTRAST:  62m GADAVIST GADOBUTROL 1 MMOL/ML IV SOLN   COMPARISON:  11/26/2020, 08/27/2018.   FINDINGS: Brain: Status post interval right temporoparietal craniotomy and resection of the subjacent enhancing mass in the right temporal lobe. Residual hyperenhancing tissue with cystic areas is seen in the superomedial and inferolateral aspects of the resection cavity, measuring approximately 3.1 x 2.2 x 1.5 cm (AP x TR x CC) (series 18, image 20 and series 20, image 4) at the inferior aspect and approximately 1.0 x 2.0 x 1.0 cm at the superior aspect (series 18, image 26 and series 20, image 4); previously the mass measured approximately 4.5 x 4.8 x 3.9 cm in total. Intrinsic T1 hyperintense signal about the resection cavity, likely postsurgical. Surrounding T2 hyperintense signal extends from the right temporal lobe posteriorly to the occipital lobe and superiorly to the frontal and parietal lobes, as well as medially into the right basal ganglia and thalamus.   Approximately 10 mm of right-to-left midline shift, overall unchanged from the prior exam. No distant acute infarct. Fluid collection subjacent to the craniotomy flap, not unexpected post surgically.   Vascular: Normal flow voids.   Skull and upper cervical spine: Status post right temporoparietal craniotomy. Otherwise normal marrow signal.   Sinuses/Orbits: No acute finding. Status post bilateral lens replacements.   Other: Fluid throughout the right mastoid air cells.   IMPRESSION: Status post  interval resection of a previously noted mass centered in the right temporal lobe, with enhancing tissue with cystic areas remaining at the superior and inferior aspect of the resection cavity, most likely residual tumor. Overall unchanged 10 mm of right-to-left midline shift.     Electronically Signed   By: AMerilyn BabaM.D.   On: 11/29/2020 19:28  Pathology: SURGICAL PATHOLOGY SURGICAL PATHOLOGY  CASE: [id number]  PATIENT: MLorenda Cahill Surgical Pathology Report      Clinical History:  brain tumor (cm)      FINAL MICROSCOPIC DIAGNOSIS:   A. BRAIN TUMOR, RIGHT PARIETAL, RESECTION:  - Pleomorphic xanthoastrocytoma, clinically recurrent.  - See comment.   COMMENT:   The tumor is characterized by fascicular and sheetlike growth pattern  with occasional multinucleated cells and focal f change.  The  morphologic features are identical to the previously diagnosed  pleomorphic xanthoastrocytoma resected in 2015 [phone], [facility]).   GROSS DESCRIPTION:  Received fresh is a 1.7 x 1.5 x 0.25 cm aggregate of pink-gray to dark  red soft tissue, entirely submitted 1 block.  SW 11/29/2020     Assessment/Plan Pleomorphic xanthoastrocytoma [facility])  We appreciate the opportunity to participate in the care of Diego Delancey.  She presents today with clinical and radiographic syndrome consistent with recurrent PXA, WHO grade 2.  Her recent resection with Dr. Christella Noa left residual tumor adjacent to visualized resection cavity.    Overall rate of disease progression since 2020 suggests an aggressive course.  We recommended transitioning at this time to radiotherapy, likely conventional or IMRT rather than radiosurgery.  She is agreeable with this plan.  We also recommended tumor profiling for CARIS, as recent data (ROAR trial) has demonstrated efficacy of combined BRAF and MEK inhibition for tumors harboring BRAF [id number] mutation.   This could be considered as  adjuvant or further salvage therapy.  She may consider Keppra 511m BID as prior.  Screening for potential clinical trials was performed and discussed using eligibility criteria [facility], as well as national database available on [email or web address]    The patient is not a candidate for a research protocol at this time due to no suitable study identified.   We spent twenty additional minutes teaching regarding the natural history, biology, and historical experience in the treatment of brain tumors. We then discussed in detail the current recommendations for therapy focusing on the mode of administration, mechanism of action, anticipated toxicities, and quality of life issues associated with this plan. We also provided teaching sheets for the patient to take home as an additional resource.  All questions were answered. The patient knows to call the clinic with any problems, questions or concerns. No barriers to learning were detected.  The total time spent in the encounter was 60 minutes and more than 50% was on counseling and review of test results   ZVentura Sellers MD Medical Director of Neuro-Oncology [facility] [place] [place]12/15/22 9:11 AM

## 2021-01-31 ENCOUNTER — Other Ambulatory Visit: Payer: Self-pay | Admitting: Radiation Therapy

## 2021-01-31 ENCOUNTER — Telehealth: Payer: Self-pay | Admitting: Radiation Oncology

## 2021-01-31 DIAGNOSIS — C712 Malignant neoplasm of temporal lobe: Secondary | ICD-10-CM

## 2021-01-31 NOTE — Telephone Encounter (Signed)
Called patient to schedule a consultation w. Dr. Moody. No answer, LVM for a return call.  

## 2021-02-03 ENCOUNTER — Telehealth: Payer: Self-pay | Admitting: Radiation Oncology

## 2021-02-03 NOTE — Telephone Encounter (Signed)
Called patient to schedule a consultation w. Dr. Moody. No answer, LVM for a return call.  

## 2021-02-05 NOTE — Progress Notes (Addendum)
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  MRI Brain 11/29/2020: Status post interval resection of a previously noted mass centered in the right temporal lobe, with enhancing tissue with cystic areas remaining at the superior and inferior aspect of the resection cavity, most likely residual tumor. Overall unchanged 10 mm of right-to-left midline shift.  MRI Brain 11/26/2020:Heterogeneous enhancing mass is present centered within the right temporal lobe inclusive of prior resection cavity with cystic areas demonstrating T2 FLAIR hyperintensity. Overall dimensions are proximally 4.5 x 5.1 x 3.9 cm.   There is surrounding T2 FLAIR hyperintensity within the right cerebral white matter extending into the frontoparietal lobes and deep white matter tracts.  CT Head 11/25/2020: New extensive edema in the right cerebral hemisphere with possible superimposed low-density lesions. Associated mass effect with effacement right lateral ventricle, 1.2 cm leftward midline shift, medialized right uncus, and mild trapping of the left lateral ventricle.  Past or anticipated interventions, if any, per neurosurgery:  Dr. Christella Noa- Disease progression, second craniotomy, sub-total resection, path unchanged 11/29/2020  Dr. Sherwood Gambler- Right temporal craniotomy, resection.  Path is PXA WHO II 09/29/2013    Past or anticipated interventions, if any, per medical oncology:  Dr. Mickeal Skinner 01/30/2021 - She presents today with clinical and radiographic syndrome consistent with recurrent PXA, WHO grade 2.  Her recent resection with Dr. Christella Noa left residual tumor adjacent to visualized resection cavity.   -Overall rate of disease progression since 2020 suggests an aggressive course.  We recommended transitioning at this time to radiotherapy, likely conventional or IMRT rather than radiosurgery.  She is agreeable  with this plan. -We also recommended tumor profiling for CARIS, as recent data (ROAR trial) has demonstrated efficacy of combined BRAF and MEK inhibition for tumors harboring BRAF V600e mutation.  This could be considered as adjuvant or further salvage therapy.   Dose of Decadron, if applicable: None  Recent neurologic symptoms, if any:  Seizures: no Headaches: No Nausea: No Dizziness/ataxia: No Difficulty with hand coordination: at baseline, arthritis and carpal tunnel. Focal numbness/weakness: No Visual deficits/changes: Ringing in ears, whooshing sound in ears when laying and switching sides. Confusion/Memory deficits: Has some memory loss, states confusion is a little better.   SAFETY ISSUES: Prior radiation? No Pacemaker/ICD? No Possible current pregnancy? Postmenopausal Is the patient on methotrexate? No  Additional Complaints / other details:

## 2021-02-06 ENCOUNTER — Ambulatory Visit
Admission: RE | Admit: 2021-02-06 | Discharge: 2021-02-06 | Disposition: A | Discharge status: Home / Self Care | Payer: BC Managed Care – PPO | Source: Ambulatory Visit | Attending: Radiation Oncology | Admitting: Radiation Oncology

## 2021-02-06 ENCOUNTER — Other Ambulatory Visit: Payer: Self-pay

## 2021-02-06 ENCOUNTER — Encounter: Payer: Self-pay | Admitting: Radiation Oncology

## 2021-02-06 VITALS — BP 150/82 | HR 92 | Temp 96.6°F | Resp 18 | Ht 61.0 in | Wt 164.1 lb

## 2021-02-06 DIAGNOSIS — I10 Essential (primary) hypertension: Secondary | ICD-10-CM | POA: Insufficient documentation

## 2021-02-06 DIAGNOSIS — E785 Hyperlipidemia, unspecified: Secondary | ICD-10-CM | POA: Diagnosis not present

## 2021-02-06 DIAGNOSIS — C712 Malignant neoplasm of temporal lobe: Secondary | ICD-10-CM | POA: Insufficient documentation

## 2021-02-06 DIAGNOSIS — C713 Malignant neoplasm of parietal lobe: Secondary | ICD-10-CM

## 2021-02-06 DIAGNOSIS — C719 Malignant neoplasm of brain, unspecified: Secondary | ICD-10-CM

## 2021-02-06 DIAGNOSIS — R7989 Other specified abnormal findings of blood chemistry: Secondary | ICD-10-CM | POA: Insufficient documentation

## 2021-02-06 DIAGNOSIS — M1990 Unspecified osteoarthritis, unspecified site: Secondary | ICD-10-CM | POA: Diagnosis not present

## 2021-02-06 DIAGNOSIS — Z79899 Other long term (current) drug therapy: Secondary | ICD-10-CM | POA: Diagnosis not present

## 2021-02-06 LAB — BUN & CREATININE (CHCC)
BUN: 15 mg/dL (ref 8–23)
Creatinine: 0.62 mg/dL (ref 0.44–1.00)
GFR, Estimated: >60 mL/min (ref >60)

## 2021-02-06 MED ORDER — SODIUM CHLORIDE 0.9% FLUSH
10.0000 mL | Freq: Once | INTRAVENOUS | Status: AC
  Administered 2021-02-06: 14:00:00 10 mL via INTRAVENOUS

## 2021-02-06 NOTE — Progress Notes (Signed)
Radiation Oncology         [phone] ________________________________  Name: Lindsey Daniels        MRN: [id number]  Date of Service: 02/06/2021 DOB: 01/22/1956  YQ:MVHQION, [place], [place]  Vaslow, Acey Lav, MD     REFERRING PHYSICIAN: Ventura Sellers, MD   DIAGNOSIS: The primary encounter diagnosis was Pleomorphic xanthoastrocytoma [facility]). A diagnosis of Malignant neoplasm of parietal lobe (HCC) was also pertinent to this visit.   HISTORY OF PRESENT ILLNESS: Lindsey Daniels is a 65 y.o. female seen at the request of Dr. Mickeal Skinner for diagnosis of recurrent pleomorphic xanthoastrocytoma.  The patient Had a grade 2 tumor that was resected with right temporal craniotomy in August 2015.  She was followed closely and after Dr. Cleon Gustin retirement has established with Dr. Christella Noa.  She recently was found to have local progression by MRI on 11/26/2020 this showed a heterogeneous mass centered within the right temporal lobe encompassing her prior surgical resection cavity with abnormal T2 flair hyperintensity and similar mass-effect to her prior head CT done the day prior.  There was leftward midline shift and trapping the left lateral ventricle.  She underwent right parietal craniotomy for tumor resection on 11/29/2020 with Dr. Christella Noa.  Final pathology was consistent with pleomorphic Frederico Hamman astrocytoma clinically recurrent and the morphologic features were identical to her prior pathology specimen from 2015.  Her postoperative MRI on 11/29/2020 showed interval resection with enhancement and cystic areas remaining at the superior and inferior aspect of the resection cavity consistent with residual tumor.  There was persistent right to left midline shift.  She has healed from surgery and has established her care with Dr. Mickeal Skinner as well.  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  He met with her  last week to discuss that with the disease progression since 2020.,  They are going to recommend tumor profiling with Caris, to determine if there is any systemic benefits of treatment and he recommended that she also consider conventional radiotherapy.  She is here to discuss radiotherapy.   PREVIOUS RADIATION THERAPY: No   PAST MEDICAL HISTORY:  Past Medical History:  Diagnosis Date   Allergy    Anal fissure    Anxiety    Cataract    Depression    Elevated LFTs    Hyperglycemia    Hyperlipidemia    Hypertension    Osteoarthritis    Pars defect of lumbar spine    Seizures (HCC)    Spondylolisthesis    lumbar       PAST SURGICAL HISTORY: Past Surgical History:  Procedure Laterality Date   cataract     CRANIOTOMY N/A 10/02/2013   Procedure: 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;  Surgeon: Hosie Spangle, MD;  Location: [place];  Service: Neurosurgery;  Laterality: N/A;  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   CRANIOTOMY Right 11/29/2020   Procedure: Right Parietal craniotomy for tumor resection;  Surgeon: Ashok Pall, MD;  Location: [facility];  Service: Neurosurgery;  Laterality: Right;   HEEL SPUR SURGERY       FAMILY HISTORY:  Family History  Problem Relation Age of Onset   Mental illness Father        depression   COPD Father    Alcohol abuse Brother    Breast cancer Neg Hx      SOCIAL HISTORY:  reports that she has never smoked. She has never used smokeless tobacco. She reports current alcohol use. She reports that she does not use drugs. The patient is divorced and lives in [place]. 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    ALLERGIES: Rifapentine, Amoxicillin, and Clavulanic acid   MEDICATIONS:  Current Outpatient Medications  Medication Sig Dispense Refill   atorvastatin (LIPITOR) 20 MG tablet Take 1 tablet (20 mg total) by mouth daily. 90 tablet 3   cholecalciferol (VITAMIN D) 1000 units  tablet Take 1,000 Units by mouth daily.     fluticasone (FLONASE) 50 MCG/ACT nasal spray Place 1-2 sprays into both nostrils daily. 16 g 0   levETIRAcetam (KEPPRA) 500 MG tablet Take 1 tablet (500 mg total) by mouth 2 (two) times daily. 60 tablet 0   meloxicam (MOBIC) 15 MG tablet Take 15 mg by mouth 2 (two) times daily as needed for pain.     Multiple Vitamin (MULTIVITAMIN WITH MINERALS) TABS tablet Take 1 tablet by mouth at bedtime.     sertraline (ZOLOFT) 100 MG tablet Take 1 tablet (100 mg total) by mouth daily. 90 tablet 3   triamcinolone (NASACORT) 55 MCG/ACT AERO nasal inhaler Place 2 sprays into the nose at bedtime. In to each nostril 1 Inhaler 12   valACYclovir (VALTREX) 500 MG tablet Take 1 tablet (500 mg total) by mouth 2 (two) times daily. Takes only if needed for breakouts (Patient taking differently: Take 500 mg by mouth 2 (two) times daily as needed (For breakouts).) 30 tablet prn   Cetirizine HCl 10 MG CAPS Take 1 capsule (10 mg total) by mouth daily for 10 days. 10 capsule 0   cyclobenzaprine (FLEXERIL) 10 MG tablet TAKE 1 TABLET (10 MG TOTAL) BY MOUTH AT BEDTIME. (Patient taking differently: Take 10 mg by mouth at bedtime as needed (arthritic pain & stiffness).) 30 tablet 0   doxycycline (VIBRAMYCIN) 100 MG capsule Take 1 capsule (100 mg total) by mouth 2 (two) times daily. 20 capsule 0   omeprazole (PRILOSEC) 40 MG capsule TAKE 1 CAPSULE (40 MG TOTAL) BY MOUTH DAILY BEFORE SUPPER. (Patient not taking: Reported on 01/30/2021) 90 capsule 3   No current facility-administered medications for this encounter.     REVIEW OF SYSTEMS: On review of systems, the patient reports that she is doing very well since surgery. She is back to her baseline in terms of function without complaints of headaches, changes in vision, movement, or coordination. No weakness or numbness is noted of her extremities, and she has ringing in her ears and whooshing sounds when laying flat or moving onto her side.  She has some memory loss but this has gotten better since surgery. No other complaints are noted.     PHYSICAL EXAM:  Wt Readings from Last 3 Encounters:  02/06/21 164 lb 2 oz (74.4 kg)  01/30/21 167 lb 4.8 oz (75.9 kg)  11/25/20 159 lb 9.8 oz (72.4 kg)   Temp Readings from Last 3 Encounters:  02/06/21 (!) 96.6 F (35.9 C) (Temporal)  01/30/21 (!) 97.5 F (36.4 C)  12/03/20 97.6 F (36.4 C) (Oral)   BP Readings from Last 3 Encounters:  02/06/21 (!) 150/82  01/30/21 124/84  12/03/20 128/74   Pulse Readings from Last 3 Encounters:  02/06/21 92  01/30/21 87  12/03/20 63     In general this is a well appearing Caucasian female in no acute distress.  She's alert and oriented x4 and appropriate throughout  the examination. Cardiopulmonary assessment is negative for acute distress and she exhibits normal effort.  A well-healed incision site is noted in her lateral right scalp.   ECOG = 1  0 - Asymptomatic (Fully active, able to carry on all predisease activities without restriction)  1 - Symptomatic but completely ambulatory (Restricted in physically strenuous activity but ambulatory and able to carry out work of a light or sedentary nature. For example, light housework, office work)  2 - Symptomatic, <50% in bed during the day (Ambulatory and capable of all self care but unable to carry out any work activities. Up and about more than 50% of waking hours)  3 - Symptomatic, >50% in bed, but not bedbound (Capable of only limited self-care, confined to bed or chair 50% or more of waking hours)  4 - Bedbound (Completely disabled. Cannot carry on any self-care. Totally confined to bed or chair)  5 - Death   Eustace Pen MM, Creech RH, Tormey DC, et al. [phone]). "Toxicity and response criteria of the [facility]". [place] Oncol. 5 (6): 649-55    LABORATORY DATA:  Lab Results  Component Value Date   WBC 12.4 (H) 11/29/2020   HGB 11.5 (L) 11/29/2020   HCT  32.8 (L) 11/29/2020   MCV 85.4 11/29/2020   PLT 226 11/29/2020   Lab Results  Component Value Date   NA 138 11/25/2020   K 3.3 (L) 11/25/2020   CL 103 11/25/2020   CO2 28 11/25/2020   Lab Results  Component Value Date   ALT 17 06/30/2017   AST 22 06/30/2017   ALKPHOS 89 06/30/2017   BILITOT 0.6 06/30/2017      RADIOGRAPHY: No results found.     IMPRESSION/PLAN: 1. 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 Dr. Lisbeth Renshaw discusses the pathology findings and reviews the nature of her tumor type and the features of recurrence are concerning for risks of future disease development.  Her postoperative MRI does indicate that there is still concern for residual disease.  Dr. Lisbeth Renshaw recommends a course of radiotherapy with goals of long-term control locally.  We discussed the risks, benefits, short, and long term effects of radiotherapy, as well as the curative intent, and the patient is interested in proceeding. Dr. Lisbeth Renshaw discusses the delivery and logistics of radiotherapy and anticipates a course of 6 weeks of radiotherapy to the right temporal lobe resection cavity. Written consent is obtained and placed in the chart, a copy was provided to the patient.  She will have IV contrast and simulation today. She will start treatment on 02/20/21.  In a visit lasting 60 minutes, greater than 50% of the time was spent face to face discussing the patient's condition, in preparation for the discussion, and coordinating the patient's care.   The above documentation reflects my direct findings during this shared patient visit. Please see the separate note by Dr. Lisbeth Renshaw on this date for the remainder of the patient's plan of care.    Carola Rhine, [facility]   **Disclaimer: This note was dictated with voice recognition software. Similar sounding words can inadvertently be transcribed and this note may contain transcription errors which may not have been corrected upon publication of  note.**

## 2021-02-06 NOTE — Progress Notes (Signed)
Has armband been applied?  Yes.   ° °Does patient have an allergy to IV contrast dye?: No. °  °Has patient ever received premedication for IV contrast dye?: No.  ° °Does patient take metformin?: No. ° °If patient does take metformin when was the last dose: No ° °Date of lab work: 02/06/2021 °BUN: 15 °CR: 0.62 °eGFR: >60  ° °IV site: Right AC ° °Has IV site been added to flowsheet?  Yes.   ° °BP (!) 150/82 (BP Location: Right Arm, Patient Position: Sitting)    Pulse 92    Temp (!) 96.6 °F (35.9 °C) (Temporal)    Resp 18    Ht 5' 1" (1.549 m)    Wt 164 lb 2 oz (74.4 kg)    SpO2 98%    BMI 31.01 kg/m²  ° ° M.  RN, BSN  °

## 2021-02-11 NOTE — Addendum Note (Signed)
Encounter addended by: Kyung Rudd, MD on: 02/11/2021 11:19 AM  Actions taken: Edit attestation on clinical note

## 2021-02-19 DIAGNOSIS — D496 Neoplasm of unspecified behavior of brain: Secondary | ICD-10-CM | POA: Insufficient documentation

## 2021-02-20 ENCOUNTER — Other Ambulatory Visit: Payer: Self-pay

## 2021-02-20 ENCOUNTER — Ambulatory Visit
Admission: RE | Admit: 2021-02-20 | Discharge: 2021-02-20 | Disposition: A | Discharge status: Home / Self Care | Payer: BC Managed Care – PPO | Source: Ambulatory Visit | Attending: Radiation Oncology | Admitting: Radiation Oncology

## 2021-02-20 DIAGNOSIS — D496 Neoplasm of unspecified behavior of brain: Secondary | ICD-10-CM | POA: Diagnosis not present

## 2021-02-21 ENCOUNTER — Other Ambulatory Visit: Payer: Self-pay

## 2021-02-21 ENCOUNTER — Ambulatory Visit
Admission: RE | Admit: 2021-02-21 | Discharge: 2021-02-21 | Disposition: A | Discharge status: Home / Self Care | Payer: BC Managed Care – PPO | Source: Ambulatory Visit | Attending: Radiation Oncology | Admitting: Radiation Oncology

## 2021-02-21 DIAGNOSIS — D496 Neoplasm of unspecified behavior of brain: Secondary | ICD-10-CM | POA: Diagnosis not present

## 2021-02-21 MED ORDER — SONAFINE EX EMUL
1.0000 "application " | Freq: Once | CUTANEOUS | Status: AC
  Administered 2021-02-21: 1 via TOPICAL

## 2021-02-21 NOTE — Progress Notes (Signed)
Pt here for patient teaching.  Pt given Radiation and You booklet, skin care instructions, and Sonafine.  Reviewed areas of pertinence such as fatigue, hair loss, nausea and vomiting, skin changes, headache, and blurry vision . Pt able to give teach back of to pat skin and use unscented/gentle soap,apply Sonafine bid and avoid applying anything to skin within 4 hours of treatment. Pt verbalizes understanding of information given and will contact nursing with any questions or concerns.    Shacoria Latif M. Randee Upchurch RN, BSN  

## 2021-02-24 ENCOUNTER — Ambulatory Visit
Admission: RE | Admit: 2021-02-24 | Discharge: 2021-02-24 | Disposition: A | Discharge status: Home / Self Care | Payer: BC Managed Care – PPO | Source: Ambulatory Visit | Attending: Radiation Oncology | Admitting: Radiation Oncology

## 2021-02-24 ENCOUNTER — Other Ambulatory Visit: Payer: Self-pay

## 2021-02-24 DIAGNOSIS — D496 Neoplasm of unspecified behavior of brain: Secondary | ICD-10-CM | POA: Diagnosis not present

## 2021-02-25 ENCOUNTER — Ambulatory Visit
Admission: RE | Admit: 2021-02-25 | Discharge: 2021-02-25 | Disposition: A | Discharge status: Home / Self Care | Payer: BC Managed Care – PPO | Source: Ambulatory Visit | Attending: Radiation Oncology | Admitting: Radiation Oncology

## 2021-02-25 DIAGNOSIS — D496 Neoplasm of unspecified behavior of brain: Secondary | ICD-10-CM | POA: Diagnosis not present

## 2021-02-26 ENCOUNTER — Ambulatory Visit
Admission: RE | Admit: 2021-02-26 | Discharge: 2021-02-26 | Disposition: A | Discharge status: Home / Self Care | Payer: BC Managed Care – PPO | Source: Ambulatory Visit | Attending: Radiation Oncology | Admitting: Radiation Oncology

## 2021-02-26 ENCOUNTER — Other Ambulatory Visit: Payer: Self-pay

## 2021-02-26 DIAGNOSIS — D496 Neoplasm of unspecified behavior of brain: Secondary | ICD-10-CM | POA: Diagnosis not present

## 2021-02-27 ENCOUNTER — Other Ambulatory Visit: Payer: Self-pay

## 2021-02-27 ENCOUNTER — Ambulatory Visit
Admission: RE | Admit: 2021-02-27 | Discharge: 2021-02-27 | Disposition: A | Discharge status: Home / Self Care | Payer: BC Managed Care – PPO | Source: Ambulatory Visit | Attending: Radiation Oncology | Admitting: Radiation Oncology

## 2021-02-27 DIAGNOSIS — D496 Neoplasm of unspecified behavior of brain: Secondary | ICD-10-CM | POA: Diagnosis not present

## 2021-02-28 ENCOUNTER — Ambulatory Visit
Admission: RE | Admit: 2021-02-28 | Discharge: 2021-02-28 | Disposition: A | Discharge status: Home / Self Care | Payer: BC Managed Care – PPO | Source: Ambulatory Visit | Attending: Radiation Oncology | Admitting: Radiation Oncology

## 2021-02-28 DIAGNOSIS — D496 Neoplasm of unspecified behavior of brain: Secondary | ICD-10-CM | POA: Diagnosis not present

## 2021-03-03 ENCOUNTER — Other Ambulatory Visit: Payer: Self-pay

## 2021-03-03 ENCOUNTER — Ambulatory Visit
Admission: RE | Admit: 2021-03-03 | Discharge: 2021-03-03 | Disposition: A | Discharge status: Home / Self Care | Payer: BC Managed Care – PPO | Source: Ambulatory Visit | Attending: Radiation Oncology | Admitting: Radiation Oncology

## 2021-03-03 DIAGNOSIS — D496 Neoplasm of unspecified behavior of brain: Secondary | ICD-10-CM | POA: Diagnosis not present

## 2021-03-04 ENCOUNTER — Encounter (HOSPITAL_COMMUNITY): Payer: Self-pay

## 2021-03-04 ENCOUNTER — Ambulatory Visit
Admission: RE | Admit: 2021-03-04 | Discharge: 2021-03-04 | Disposition: A | Discharge status: Home / Self Care | Payer: BC Managed Care – PPO | Source: Ambulatory Visit | Attending: Radiation Oncology | Admitting: Radiation Oncology

## 2021-03-04 ENCOUNTER — Encounter: Payer: Self-pay | Admitting: Internal Medicine

## 2021-03-04 DIAGNOSIS — D496 Neoplasm of unspecified behavior of brain: Secondary | ICD-10-CM | POA: Diagnosis not present

## 2021-03-05 ENCOUNTER — Ambulatory Visit
Admission: RE | Admit: 2021-03-05 | Discharge: 2021-03-05 | Disposition: A | Discharge status: Home / Self Care | Payer: BC Managed Care – PPO | Source: Ambulatory Visit | Attending: Radiation Oncology | Admitting: Radiation Oncology

## 2021-03-05 ENCOUNTER — Telehealth: Payer: Self-pay

## 2021-03-05 DIAGNOSIS — D496 Neoplasm of unspecified behavior of brain: Secondary | ICD-10-CM | POA: Diagnosis not present

## 2021-03-05 NOTE — Telephone Encounter (Signed)
Patient spoke with [employer] on 03/03/21 @ 4:44 PM requesting call back from RN regarding what apt  and labs on 03/06/21 "were for". Placed generic message on VM requesting a call back to discuss.

## 2021-03-06 ENCOUNTER — Ambulatory Visit: Payer: BC Managed Care – PPO | Admitting: Internal Medicine

## 2021-03-06 ENCOUNTER — Ambulatory Visit
Admission: RE | Admit: 2021-03-06 | Discharge: 2021-03-06 | Disposition: A | Discharge status: Home / Self Care | Payer: BC Managed Care – PPO | Source: Ambulatory Visit | Attending: Radiation Oncology | Admitting: Radiation Oncology

## 2021-03-06 ENCOUNTER — Other Ambulatory Visit: Payer: Self-pay | Admitting: *Deleted

## 2021-03-06 ENCOUNTER — Other Ambulatory Visit: Payer: Self-pay

## 2021-03-06 ENCOUNTER — Other Ambulatory Visit: Payer: BC Managed Care – PPO

## 2021-03-06 DIAGNOSIS — D496 Neoplasm of unspecified behavior of brain: Secondary | ICD-10-CM | POA: Diagnosis not present

## 2021-03-07 ENCOUNTER — Ambulatory Visit
Admission: RE | Admit: 2021-03-07 | Discharge: 2021-03-07 | Disposition: A | Discharge status: Home / Self Care | Payer: BC Managed Care – PPO | Source: Ambulatory Visit | Attending: Radiation Oncology | Admitting: Radiation Oncology

## 2021-03-07 DIAGNOSIS — D496 Neoplasm of unspecified behavior of brain: Secondary | ICD-10-CM | POA: Diagnosis not present

## 2021-03-10 ENCOUNTER — Other Ambulatory Visit: Payer: Self-pay

## 2021-03-10 ENCOUNTER — Ambulatory Visit
Admission: RE | Admit: 2021-03-10 | Discharge: 2021-03-10 | Disposition: A | Discharge status: Home / Self Care | Payer: BC Managed Care – PPO | Source: Ambulatory Visit | Attending: Radiation Oncology | Admitting: Radiation Oncology

## 2021-03-10 DIAGNOSIS — D496 Neoplasm of unspecified behavior of brain: Secondary | ICD-10-CM | POA: Diagnosis not present

## 2021-03-11 ENCOUNTER — Other Ambulatory Visit: Payer: Self-pay

## 2021-03-11 ENCOUNTER — Ambulatory Visit
Admission: RE | Admit: 2021-03-11 | Discharge: 2021-03-11 | Disposition: A | Discharge status: Home / Self Care | Payer: BC Managed Care – PPO | Source: Ambulatory Visit | Attending: Radiation Oncology | Admitting: Radiation Oncology

## 2021-03-11 DIAGNOSIS — D496 Neoplasm of unspecified behavior of brain: Secondary | ICD-10-CM | POA: Diagnosis not present

## 2021-03-12 ENCOUNTER — Ambulatory Visit
Admission: RE | Admit: 2021-03-12 | Discharge: 2021-03-12 | Disposition: A | Discharge status: Home / Self Care | Payer: BC Managed Care – PPO | Source: Ambulatory Visit | Attending: Radiation Oncology | Admitting: Radiation Oncology

## 2021-03-12 DIAGNOSIS — D496 Neoplasm of unspecified behavior of brain: Secondary | ICD-10-CM | POA: Diagnosis not present

## 2021-03-13 ENCOUNTER — Ambulatory Visit
Admission: RE | Admit: 2021-03-13 | Discharge: 2021-03-13 | Disposition: A | Discharge status: Home / Self Care | Payer: BC Managed Care – PPO | Source: Ambulatory Visit | Attending: Radiation Oncology | Admitting: Radiation Oncology

## 2021-03-13 ENCOUNTER — Other Ambulatory Visit: Payer: Self-pay

## 2021-03-13 DIAGNOSIS — D496 Neoplasm of unspecified behavior of brain: Secondary | ICD-10-CM | POA: Diagnosis not present

## 2021-03-14 ENCOUNTER — Ambulatory Visit
Admission: RE | Admit: 2021-03-14 | Discharge: 2021-03-14 | Disposition: A | Discharge status: Home / Self Care | Payer: BC Managed Care – PPO | Source: Ambulatory Visit | Attending: Radiation Oncology | Admitting: Radiation Oncology

## 2021-03-14 ENCOUNTER — Other Ambulatory Visit: Payer: Self-pay

## 2021-03-14 DIAGNOSIS — D496 Neoplasm of unspecified behavior of brain: Secondary | ICD-10-CM | POA: Diagnosis not present

## 2021-03-17 ENCOUNTER — Ambulatory Visit
Admission: RE | Admit: 2021-03-17 | Discharge: 2021-03-17 | Disposition: A | Discharge status: Home / Self Care | Payer: BC Managed Care – PPO | Source: Ambulatory Visit | Attending: Radiation Oncology | Admitting: Radiation Oncology

## 2021-03-17 DIAGNOSIS — D496 Neoplasm of unspecified behavior of brain: Secondary | ICD-10-CM | POA: Diagnosis not present

## 2021-03-18 ENCOUNTER — Ambulatory Visit
Admission: RE | Admit: 2021-03-18 | Discharge: 2021-03-18 | Disposition: A | Discharge status: Home / Self Care | Payer: BC Managed Care – PPO | Source: Ambulatory Visit | Attending: Radiation Oncology | Admitting: Radiation Oncology

## 2021-03-18 ENCOUNTER — Other Ambulatory Visit: Payer: Self-pay

## 2021-03-18 DIAGNOSIS — D496 Neoplasm of unspecified behavior of brain: Secondary | ICD-10-CM | POA: Diagnosis not present

## 2021-03-19 ENCOUNTER — Ambulatory Visit
Admission: RE | Admit: 2021-03-19 | Discharge: 2021-03-19 | Disposition: A | Discharge status: Home / Self Care | Payer: BC Managed Care – PPO | Source: Ambulatory Visit | Attending: Radiation Oncology | Admitting: Radiation Oncology

## 2021-03-19 DIAGNOSIS — D496 Neoplasm of unspecified behavior of brain: Secondary | ICD-10-CM | POA: Diagnosis not present

## 2021-03-20 ENCOUNTER — Ambulatory Visit
Admission: RE | Admit: 2021-03-20 | Discharge: 2021-03-20 | Disposition: A | Discharge status: Home / Self Care | Payer: BC Managed Care – PPO | Source: Ambulatory Visit | Attending: Radiation Oncology | Admitting: Radiation Oncology

## 2021-03-20 ENCOUNTER — Ambulatory Visit: Payer: BC Managed Care – PPO | Admitting: Internal Medicine

## 2021-03-20 ENCOUNTER — Other Ambulatory Visit: Payer: BC Managed Care – PPO

## 2021-03-20 ENCOUNTER — Other Ambulatory Visit: Payer: Self-pay

## 2021-03-20 DIAGNOSIS — D496 Neoplasm of unspecified behavior of brain: Secondary | ICD-10-CM | POA: Diagnosis not present

## 2021-03-21 ENCOUNTER — Ambulatory Visit
Admission: RE | Admit: 2021-03-21 | Discharge: 2021-03-21 | Disposition: A | Discharge status: Home / Self Care | Payer: BC Managed Care – PPO | Source: Ambulatory Visit | Attending: Radiation Oncology | Admitting: Radiation Oncology

## 2021-03-21 DIAGNOSIS — D496 Neoplasm of unspecified behavior of brain: Secondary | ICD-10-CM | POA: Diagnosis not present

## 2021-03-24 ENCOUNTER — Other Ambulatory Visit: Payer: Self-pay

## 2021-03-24 ENCOUNTER — Ambulatory Visit
Admission: RE | Admit: 2021-03-24 | Discharge: 2021-03-24 | Disposition: A | Discharge status: Home / Self Care | Payer: BC Managed Care – PPO | Source: Ambulatory Visit | Attending: Radiation Oncology | Admitting: Radiation Oncology

## 2021-03-24 DIAGNOSIS — D496 Neoplasm of unspecified behavior of brain: Secondary | ICD-10-CM | POA: Diagnosis not present

## 2021-03-25 ENCOUNTER — Ambulatory Visit
Admission: RE | Admit: 2021-03-25 | Discharge: 2021-03-25 | Disposition: A | Discharge status: Home / Self Care | Payer: BC Managed Care – PPO | Source: Ambulatory Visit | Attending: Radiation Oncology | Admitting: Radiation Oncology

## 2021-03-25 DIAGNOSIS — D496 Neoplasm of unspecified behavior of brain: Secondary | ICD-10-CM | POA: Diagnosis not present

## 2021-03-26 ENCOUNTER — Other Ambulatory Visit: Payer: Self-pay

## 2021-03-26 ENCOUNTER — Ambulatory Visit
Admission: RE | Admit: 2021-03-26 | Discharge: 2021-03-26 | Disposition: A | Discharge status: Home / Self Care | Payer: BC Managed Care – PPO | Source: Ambulatory Visit | Attending: Radiation Oncology | Admitting: Radiation Oncology

## 2021-03-26 DIAGNOSIS — D496 Neoplasm of unspecified behavior of brain: Secondary | ICD-10-CM | POA: Diagnosis not present

## 2021-03-27 ENCOUNTER — Other Ambulatory Visit: Payer: Self-pay

## 2021-03-27 ENCOUNTER — Ambulatory Visit
Admission: RE | Admit: 2021-03-27 | Discharge: 2021-03-27 | Disposition: A | Discharge status: Home / Self Care | Payer: BC Managed Care – PPO | Source: Ambulatory Visit | Attending: Radiation Oncology | Admitting: Radiation Oncology

## 2021-03-27 DIAGNOSIS — D496 Neoplasm of unspecified behavior of brain: Secondary | ICD-10-CM | POA: Diagnosis not present

## 2021-03-28 ENCOUNTER — Other Ambulatory Visit: Payer: Self-pay

## 2021-03-28 ENCOUNTER — Ambulatory Visit
Admission: RE | Admit: 2021-03-28 | Discharge: 2021-03-28 | Disposition: A | Discharge status: Home / Self Care | Payer: BC Managed Care – PPO | Source: Ambulatory Visit | Attending: Radiation Oncology | Admitting: Radiation Oncology

## 2021-03-28 DIAGNOSIS — D496 Neoplasm of unspecified behavior of brain: Secondary | ICD-10-CM | POA: Diagnosis not present

## 2021-03-31 ENCOUNTER — Ambulatory Visit
Admission: RE | Admit: 2021-03-31 | Discharge: 2021-03-31 | Disposition: A | Discharge status: Home / Self Care | Payer: BC Managed Care – PPO | Source: Ambulatory Visit | Attending: Radiation Oncology | Admitting: Radiation Oncology

## 2021-03-31 DIAGNOSIS — D496 Neoplasm of unspecified behavior of brain: Secondary | ICD-10-CM | POA: Diagnosis not present

## 2021-04-01 ENCOUNTER — Other Ambulatory Visit: Payer: BC Managed Care – PPO

## 2021-04-01 ENCOUNTER — Inpatient Hospital Stay (HOSPITAL_BASED_OUTPATIENT_CLINIC_OR_DEPARTMENT_OTHER): Payer: BC Managed Care – PPO | Admitting: Internal Medicine

## 2021-04-01 ENCOUNTER — Other Ambulatory Visit: Payer: Self-pay

## 2021-04-01 ENCOUNTER — Ambulatory Visit
Admission: RE | Admit: 2021-04-01 | Discharge: 2021-04-01 | Disposition: A | Discharge status: Home / Self Care | Payer: BC Managed Care – PPO | Source: Ambulatory Visit | Attending: Radiation Oncology | Admitting: Radiation Oncology

## 2021-04-01 VITALS — BP 135/89 | HR 77 | Temp 97.0°F | Resp 16 | Wt 160.1 lb

## 2021-04-01 DIAGNOSIS — D496 Neoplasm of unspecified behavior of brain: Secondary | ICD-10-CM | POA: Diagnosis not present

## 2021-04-01 DIAGNOSIS — C711 Malignant neoplasm of frontal lobe: Secondary | ICD-10-CM | POA: Insufficient documentation

## 2021-04-01 DIAGNOSIS — C719 Malignant neoplasm of brain, unspecified: Secondary | ICD-10-CM | POA: Diagnosis not present

## 2021-04-01 NOTE — Progress Notes (Signed)
[place] [facility] [place], [place] [address] [phone]   New Patient Evaluation  Date of Service: 04/01/21 Patient Name: Lindsey Daniels Patient MRN: [id number] Patient DOB: Nov 12, 1955 Provider: Ventura Sellers, MD  Identifying Statement:  Lindsey Daniels is a 66 y.o. female with right temporal  pleomorphic xanthoastrocytoma  who presents for initial consultation and evaluation.    Referring Provider: Harrison Mons, [address] [place],  [place] [id number]  Oncologic History: Oncology History  Pleomorphic xanthoastrocytoma [facility])  09/29/2013 Surgery   Right temporal craniotomy, resection with Dr. Sherwood Gambler.  Path is PXA WHO II.   11/29/2020 Surgery   Disease progression, second craniotomy with Dr. Christella Noa, sub-total resection.  Path is unchanged.   02/21/2021 - 04/02/2021 Radiation Therapy   IMRT radiation therapy with Dr. Lisbeth Renshaw     Biomarkers:  MGMT Methylated.  IDH 1/2 Wild type.  BRAF V600e mutation detected  TERT Unmutated   Interval History: Lindsey Daniels presents today, now in final week of radiation therapy for recurrent PXA.  She has tolerated treatment well without any significant complication.  Remains active and independent.  Does describe increase in fatigue as was expected from 5th and 6th week of treatment.   H+P (01/30/21) Patient presents today after recent re-resection for progressive right temporal PXA.  She initially presented with a seizure in 2015; imaging demonstrated enhancing right temporal lesion which was grossly resected by Dr. Sherwood Gambler.  After last follow up in 2020, she began to experience headaches, ear fullness and some confusion this fall.  Imaging demonstrated marked progression of disease; she went for second surgery on 11/29/20.  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    Medications: Current Outpatient Medications on File Prior to Visit  Medication Sig Dispense Refill   atorvastatin (LIPITOR) 20 MG tablet Take 1 tablet (20 mg total) by mouth daily. 90 tablet 3   cholecalciferol (VITAMIN D) 1000 units tablet Take 1,000 Units by mouth daily.     fluticasone (FLONASE) 50 MCG/ACT nasal spray Place 1-2 sprays into both nostrils daily. 16 g 0   levETIRAcetam (KEPPRA) 500 MG tablet Take 1 tablet (500 mg total) by mouth 2 (two) times daily. 60 tablet 0   meloxicam (MOBIC) 15 MG tablet Take 15 mg by mouth 2 (two) times daily as needed for pain.     Multiple Vitamin (MULTIVITAMIN WITH MINERALS) TABS tablet Take 1 tablet by mouth at bedtime.     omeprazole (PRILOSEC) 40 MG capsule TAKE 1 CAPSULE (40 MG TOTAL) BY MOUTH DAILY BEFORE SUPPER. (Patient not taking: Reported on 01/30/2021) 90 capsule 3   sertraline (ZOLOFT) 100 MG tablet Take 1 tablet (100 mg total) by mouth daily. 90 tablet 3   triamcinolone (NASACORT) 55 MCG/ACT AERO nasal inhaler Place 2 sprays into the nose at bedtime. In to each nostril 1 Inhaler 12   valACYclovir (VALTREX) 500 MG tablet Take 1 tablet (500 mg total) by mouth 2 (two) times daily. Takes only if needed for breakouts (Patient taking differently: Take 500 mg by mouth 2 (two) times daily as needed (For breakouts).) 30 tablet prn   No current facility-administered medications on file prior to visit.    Allergies:  Allergies  Allergen Reactions   Rifapentine Other (See Comments)    Flu-like symptoms   Amoxicillin Rash   Clavulanic Acid Rash  Past Medical History:  Past Medical History:  Diagnosis Date   Allergy    Anal fissure    Anxiety    Cataract    Depression    Elevated LFTs    Hyperglycemia    Hyperlipidemia    Hypertension    Osteoarthritis    Pars defect of lumbar spine    Seizures (HCC)    Spondylolisthesis    lumbar   Past Surgical History:  Past Surgical History:  Procedure Laterality Date   cataract      CRANIOTOMY N/A 10/02/2013   Procedure: 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;  Surgeon: Hosie Spangle, MD;  Location: [facility];  Service: Neurosurgery;  Laterality: N/A;  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   CRANIOTOMY Right 11/29/2020   Procedure: Right Parietal craniotomy for tumor resection;  Surgeon: Ashok Pall, MD;  Location: [facility];  Service: Neurosurgery;  Laterality: Right;   HEEL SPUR SURGERY     Social History:  Social History   Socioeconomic History   Marital status: Divorced    Spouse name: separated-no papers file   Number of children: 0   Years of education: Not on file   Highest education level: Not on file  Occupational History   Occupation: RT    Employer: [employer] @ [employer]: [employer] and [employer]  Tobacco Use   Smoking status: Never   Smokeless tobacco: Never  Substance and Sexual Activity   Alcohol use: Yes    Alcohol/week: 0.0 - 4.0 standard drinks    Comment: social   Drug use: No   Sexual activity: Not Currently    Partners: Male  Other Topics Concern   Not on file  Social History Narrative   Separated from second husband.  Very contentious split.   Social Determinants of Health   Financial Resource Strain: Not on file  Food Insecurity: Not on file  Transportation Needs: Not on file  Physical Activity: Not on file  Stress: Not on file  Social Connections: Not on file  Intimate Partner Violence: Not At Risk   Fear of Current or Ex-Partner: No   Emotionally Abused: No   Physically Abused: No   Sexually Abused: No   Family History:  Family History  Problem Relation Age of Onset   Mental illness Father        depression   COPD Father    Alcohol abuse Brother    Breast cancer Neg Hx     Review of Systems: Constitutional: Doesn't report fevers, chills or abnormal weight loss Eyes: Doesn't report blurriness of vision Ears, nose, mouth, throat, and face: Doesn't  report sore throat Respiratory: Doesn't report cough, dyspnea or wheezes Cardiovascular: Doesn't report palpitation, chest discomfort  Gastrointestinal:  Doesn't report nausea, constipation, diarrhea GU: Doesn't report incontinence Skin: Doesn't report skin rashes Neurological: Per HPI Musculoskeletal: Doesn't report joint pain Behavioral/Psych: Doesn't report anxiety  Physical Exam: Vitals:   04/01/21 1524  BP: 135/89  Pulse: 77  Resp: 16  Temp: (!) 97 F (36.1 C)  SpO2: 98%    KPS: 90. General: Alert, cooperative, pleasant, in no acute distress Head: Normal EENT: No conjunctival injection or scleral icterus.  Lungs: Resp effort normal Cardiac: Regular rate Abdomen: Non-distended abdomen Skin: No rashes cyanosis or petechiae. Extremities: No clubbing or edema  Neurologic Exam: Mental Status: Awake, alert, attentive to examiner. Oriented to self and environment. Language is fluent with intact comprehension.  Cranial  Nerves: Visual acuity is grossly normal. Visual fields are full. Extra-ocular movements intact. No ptosis. Face is symmetric Motor: Tone and bulk are normal. Power is full in both arms and legs. Reflexes are symmetric, no pathologic reflexes present.  Sensory: Intact to light touch Gait: Normal.   Labs: I have reviewed the data as listed    Component Value Date/Time   NA 138 11/25/2020 1118   NA 142 06/30/2017 1815   K 3.3 (L) 11/25/2020 1118   CL 103 11/25/2020 1118   CO2 28 11/25/2020 1118   GLUCOSE 109 (H) 11/25/2020 1118   BUN 15 02/06/2021 1121   BUN 14 06/30/2017 1815   CREATININE 0.62 02/06/2021 1121   CREATININE 0.72 06/07/2015 0825   CALCIUM 9.4 11/25/2020 1118   PROT 7.0 06/30/2017 1815   ALBUMIN 4.6 06/30/2017 1815   AST 22 06/30/2017 1815   ALT 17 06/30/2017 1815   ALKPHOS 89 06/30/2017 1815   BILITOT 0.6 06/30/2017 1815   GFRNONAA >60 02/06/2021 1121   GFRAA 108 06/30/2017 1815   Lab Results  Component Value Date   WBC 12.4 (H)  11/29/2020   NEUTROABS 3.8 06/30/2017   HGB 11.5 (L) 11/29/2020   HCT 32.8 (L) 11/29/2020   MCV 85.4 11/29/2020   PLT 226 11/29/2020    Assessment/Plan Pleomorphic xanthoastrocytoma ([place])  Lindsey Daniels is clinically stable, now in final week of IMRT for right temporal PXA grade 2.  Today we counseled her on nutrition, exercise, sleep.  We reviewed CARIS profile, which demonstrated presence of BRAF v600e mutation.  She could potentially benefit from BRAF/MEK inhibitor in the future.  She may consider Keppra 516m BID as prior.  We ask that MKarle StarchSiler return to clinic in 2 months with post-RT brain MRI, or sooner as needed.  All questions were answered. The patient knows to call the clinic with any problems, questions or concerns. No barriers to learning were detected.  The total time spent in the encounter was 40 minutes and more than 50% was on counseling and review of test results   ZVentura Sellers MD Medical Director of Neuro-Oncology [facility]eg[facility]at [place]02/14/23 3:18 PM

## 2021-04-02 ENCOUNTER — Encounter: Payer: Self-pay | Admitting: Radiation Oncology

## 2021-04-02 ENCOUNTER — Ambulatory Visit
Admission: RE | Admit: 2021-04-02 | Discharge: 2021-04-02 | Disposition: A | Discharge status: Home / Self Care | Payer: BC Managed Care – PPO | Source: Ambulatory Visit | Attending: Radiation Oncology | Admitting: Radiation Oncology

## 2021-04-02 ENCOUNTER — Other Ambulatory Visit: Payer: Self-pay

## 2021-04-02 DIAGNOSIS — D496 Neoplasm of unspecified behavior of brain: Secondary | ICD-10-CM | POA: Diagnosis not present

## 2021-04-03 ENCOUNTER — Telehealth: Payer: Self-pay | Admitting: Internal Medicine

## 2021-04-03 NOTE — Telephone Encounter (Signed)
Scheduled per 2/13 los, message has been left

## 2021-04-08 NOTE — Progress Notes (Signed)
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  Intent: Curative  Radiation Treatment Dates: 02/20/2021 through 04/02/2021 Site Technique Total Dose (Gy) Dose per Fx (Gy) Completed Fx Beam Energies  Brain: Brain IMRT 46/46 2 23/23 6X  Brain: Brain_Bst IMRT 14/14 2 7/7 6X   Narrative: The patient tolerated radiation therapy relatively well. She developed fatigue and anticipated skin changes in the treatment field.   Plan: The patient will receive a call in about one month from the radiation oncology department. She will continue follow up with Dr. Mickeal Skinner as well.   ________________________________________________    Carola Rhine, [facility]

## 2021-04-15 ENCOUNTER — Other Ambulatory Visit: Payer: Self-pay | Admitting: Radiation Therapy

## 2021-04-23 ENCOUNTER — Other Ambulatory Visit: Payer: Self-pay | Admitting: Physician Assistant

## 2021-04-23 DIAGNOSIS — Z1231 Encounter for screening mammogram for malignant neoplasm of breast: Secondary | ICD-10-CM

## 2021-05-05 ENCOUNTER — Ambulatory Visit
Admission: RE | Admit: 2021-05-05 | Discharge: 2021-05-05 | Disposition: A | Discharge status: Home / Self Care | Payer: BC Managed Care – PPO | Source: Ambulatory Visit | Attending: Internal Medicine | Admitting: Internal Medicine

## 2021-05-05 DIAGNOSIS — C719 Malignant neoplasm of brain, unspecified: Secondary | ICD-10-CM

## 2021-05-05 NOTE — Progress Notes (Signed)
?  Radiation Oncology         [phone] ?________________________________ ? ?Name: Lindsey Daniels MRN: [id number]  ?Date of Service: 05/05/2021  DOB: 10-22-1955 ? ?Post Treatment Telephone Note ? 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 ? ?Intent: Curative ? ?Radiation Treatment Dates: 02/20/2021 through 04/02/2021 ?Site Technique Total Dose (Gy) Dose per Fx (Gy) Completed Fx Beam Energies  ?Brain: Brain IMRT 46/46 2 23/23 6X  ?Brain: Brain_Bst IMRT 14/14 2 7/7 6X  ? ?Narrative: The patient tolerated radiation therapy relatively well. She developed fatigue and anticipated skin changes in the treatment field. She has an MRI scheduled early next month with follow up with Dr. Mickeal Skinner to follow. ? ?Impression/Plan: ?1. 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  I was unable to reach the patient but left a voicemail and on the message, I discussed that we would be happy to continue to follow her as needed, but she will also continue to follow up with Dr. Mickeal Skinner. ? ? ? ? ? ?Carola Rhine, PAC  ? ? ? ? ?

## 2021-05-30 ENCOUNTER — Ambulatory Visit (HOSPITAL_COMMUNITY)
Admission: RE | Admit: 2021-05-30 | Discharge: 2021-05-30 | Disposition: A | Discharge status: Home / Self Care | Payer: BC Managed Care – PPO | Source: Ambulatory Visit | Attending: Internal Medicine | Admitting: Internal Medicine

## 2021-05-30 DIAGNOSIS — C719 Malignant neoplasm of brain, unspecified: Secondary | ICD-10-CM | POA: Insufficient documentation

## 2021-05-30 MED ORDER — GADOBUTROL 1 MMOL/ML IV SOLN
7.0000 mL | Freq: Once | INTRAVENOUS | Status: AC | PRN
  Administered 2021-05-30: 7 mL via INTRAVENOUS

## 2021-06-02 ENCOUNTER — Inpatient Hospital Stay: Payer: BC Managed Care – PPO | Attending: Radiation Oncology

## 2021-06-02 DIAGNOSIS — C712 Malignant neoplasm of temporal lobe: Secondary | ICD-10-CM | POA: Insufficient documentation

## 2021-06-03 ENCOUNTER — Other Ambulatory Visit: Payer: Self-pay

## 2021-06-03 ENCOUNTER — Ambulatory Visit: Payer: BC Managed Care – PPO | Admitting: Internal Medicine

## 2021-06-03 ENCOUNTER — Inpatient Hospital Stay (HOSPITAL_BASED_OUTPATIENT_CLINIC_OR_DEPARTMENT_OTHER): Payer: BC Managed Care – PPO | Admitting: Internal Medicine

## 2021-06-03 VITALS — BP 133/79 | HR 72 | Temp 97.9°F | Resp 17 | Ht 61.0 in | Wt 153.3 lb

## 2021-06-03 DIAGNOSIS — C712 Malignant neoplasm of temporal lobe: Secondary | ICD-10-CM | POA: Diagnosis present

## 2021-06-03 DIAGNOSIS — C719 Malignant neoplasm of brain, unspecified: Secondary | ICD-10-CM | POA: Diagnosis not present

## 2021-06-03 MED ORDER — DEXAMETHASONE 4 MG PO TABS: 4.0000 mg | ORAL_TABLET | Freq: Every day | ORAL | 1 refills | Status: DC

## 2021-06-03 NOTE — Progress Notes (Signed)
? ?[facility] at [address]  ?[place], [place] [address] ?[phone] ? ? ?Interva Evaluation ? ?Date of Service: 06/03/21 ?Patient Name: Lindsey Daniels ?Patient MRN: [id number] ?Patient DOB: 02-06-1956 ?Provider: Ventura Sellers, MD ? ?Identifying Statement:  ?Lindsey Daniels is a 66 y.o. female with right temporal  pleomorphic xanthoastrocytoma   ? ?Oncologic History: ?Oncology History  ?Pleomorphic xanthoastrocytoma ([place])  ?09/29/2013 Surgery  ? Right temporal craniotomy, resection with Dr. Sherwood Gambler.  Path is PXA WHO II. ?  ?11/29/2020 Surgery  ? Disease progression, second craniotomy with Dr. Christella Noa, sub-total resection.  Path is unchanged. ?  ?02/21/2021 - 04/02/2021 Radiation Therapy  ? IMRT radiation therapy with Dr. Lisbeth Renshaw ?  ? ? ?Biomarkers: ? ?MGMT Methylated.  ?IDH 1/2 Wild type.  ?BRAF V600e mutation detected  ?TERT Unmutated  ? ?Interval History: ?Lindsey Daniels presents today, now two months removed from radiation therapy for recurrent PXA.  She completed treatment well without any significant complication.  Remains active and independent.  Fatigue is still lingering, however, maybe even a little worse than during radiation.  Colleagues have noticed that she's been "a bit slower" at work.  ? ?H+P (01/30/21) Patient presents today after recent re-resection for progressive right temporal PXA.  She initially presented with a seizure in 2015; imaging demonstrated enhancing right temporal lesion which was grossly resected by Dr. Sherwood Gambler.  After last follow up in 2020, she began to experience headaches, ear fullness and some confusion this fall.  Imaging demonstrated marked progression of disease; she went for second surgery on 11/29/20.  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  ? ?Medications: ?Current Outpatient Medications on File Prior to Visit  ?Medication Sig Dispense Refill  ?  atorvastatin (LIPITOR) 20 MG tablet Take 1 tablet (20 mg total) by mouth daily. 90 tablet 3  ? cholecalciferol (VITAMIN D) 1000 units tablet Take 1,000 Units by mouth daily.    ? fluticasone (FLONASE) 50 MCG/ACT nasal spray Place 1-2 sprays into both nostrils daily. 16 g 0  ? levETIRAcetam (KEPPRA) 500 MG tablet Take 1 tablet (500 mg total) by mouth 2 (two) times daily. 60 tablet 0  ? meloxicam (MOBIC) 15 MG tablet Take 15 mg by mouth 2 (two) times daily as needed for pain.    ? Multiple Vitamin (MULTIVITAMIN WITH MINERALS) TABS tablet Take 1 tablet by mouth at bedtime.    ? sertraline (ZOLOFT) 100 MG tablet Take 1 tablet (100 mg total) by mouth daily. 90 tablet 3  ? triamcinolone (NASACORT) 55 MCG/ACT AERO nasal inhaler Place 2 sprays into the nose at bedtime. In to each nostril 1 Inhaler 12  ? valACYclovir (VALTREX) 500 MG tablet Take 1 tablet (500 mg total) by mouth 2 (two) times daily. Takes only if needed for breakouts (Patient not taking: Reported on 04/01/2021) 30 tablet prn  ? ?No current facility-administered medications on file prior to visit.  ? ? ?Allergies:  ?Allergies  ?Allergen Reactions  ? Rifapentine Other (See Comments)  ?  Flu-like symptoms  ? Amoxicillin Rash  ? Clavulanic Acid Rash  ? ?Past Medical History:  ?Past Medical History:  ?Diagnosis Date  ? Allergy   ? Anal fissure   ? Anxiety   ? Cataract   ? Depression   ? Elevated LFTs   ? Hyperglycemia   ? Hyperlipidemia   ? Hypertension   ? Osteoarthritis   ?  Pars defect of lumbar spine   ? Seizures ([place])   ? Spondylolisthesis   ? lumbar  ? ?Past Surgical History:  ?Past Surgical History:  ?Procedure Laterality Date  ? cataract    ? CRANIOTOMY N/A 10/02/2013  ? Procedure: 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;  Surgeon: Hosie Spangle, MD;  Location: [facility];  Service: Neurosurgery;  Laterality: N/A;  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  ? CRANIOTOMY Right 11/29/2020  ? Procedure: Right Parietal craniotomy for tumor  resection;  Surgeon: Ashok Pall, MD;  Location: [facility];  Service: Neurosurgery;  Laterality: Right;  ? HEEL SPUR SURGERY    ? ?Social History:  ?Social History  ? ?Socioeconomic History  ? Marital status: Divorced  ?  Spouse name: separated-no papers file  ? Number of children: 0  ? Years of education: Not on file  ? Highest education level: Not on file  ?Occupational History  ? Occupation: RT  ?  Employer: [employer] @ [employer]  ?  Comment: [employer] and [employer]  ?Tobacco Use  ? Smoking status: Never  ? Smokeless tobacco: Never  ?Substance and Sexual Activity  ? Alcohol use: Yes  ?  Alcohol/week: 0.0 - 4.0 standard drinks  ?  Comment: social  ? Drug use: No  ? Sexual activity: Not Currently  ?  Partners: Male  ?Other Topics Concern  ? Not on file  ?Social History Narrative  ? Separated from second husband.  Very contentious split.  ? ?Social Determinants of Health  ? ?Financial Resource Strain: Not on file  ?Food Insecurity: Not on file  ?Transportation Needs: Not on file  ?Physical Activity: Not on file  ?Stress: Not on file  ?Social Connections: Not on file  ?Intimate Partner Violence: Not At Risk  ? Fear of Current or Ex-Partner: No  ? Emotionally Abused: No  ? Physically Abused: No  ? Sexually Abused: No  ? ?Family History:  ?Family History  ?Problem Relation Age of Onset  ? Mental illness Father   ?     depression  ? COPD Father   ? Alcohol abuse Brother   ? Breast cancer Neg Hx   ? ? ?Review of Systems: ?Constitutional: Doesn't report fevers, chills or abnormal weight loss ?Eyes: Doesn't report blurriness of vision ?Ears, nose, mouth, throat, and face: Doesn't report sore throat ?Respiratory: Doesn't report cough, dyspnea or wheezes ?Cardiovascular: Doesn't report palpitation, chest discomfort  ?Gastrointestinal:  Doesn't report nausea, constipation, diarrhea ?GU: Doesn't report incontinence ?Skin: Doesn't report skin rashes ?Neurological: Per HPI ?Musculoskeletal: Doesn't  report joint pain ?Behavioral/Psych: Doesn't report anxiety ? ?Physical Exam: ?Vitals:  ? 06/03/21 1407  ?BP: 133/79  ?Pulse: 72  ?Resp: 17  ?Temp: 97.9 ?F (36.6 ?C)  ?SpO2: 98%  ? ?KPS: 90. ?General: Alert, cooperative, pleasant, in no acute distress ?Head: Normal ?EENT: No conjunctival injection or scleral icterus.  ?Lungs: Resp effort normal ?Cardiac: Regular rate ?Abdomen: Non-distended abdomen ?Skin: No rashes cyanosis or petechiae. ?Extremities: No clubbing or edema ? ?Neurologic Exam: ?Mental Status: Awake, alert, attentive to examiner. Oriented to self and environment. Language is fluent with intact comprehension.  ?Cranial Nerves: Visual acuity is grossly normal. Visual fields are full. Extra-ocular movements intact. No ptosis. Face is symmetric ?Motor: Tone and bulk are normal. Power is full in both arms and legs. Reflexes are symmetric, no pathologic reflexes present.  ?Sensory: Intact to light touch ?Gait: Normal. ? ? ?Labs: ?I have reviewed the data as  listed ?   ?Component Value Date/Time  ? NA 138 11/25/2020 1118  ? NA 142 06/30/2017 1815  ? K 3.3 (L) 11/25/2020 1118  ? CL 103 11/25/2020 1118  ? CO2 28 11/25/2020 1118  ? GLUCOSE 109 (H) 11/25/2020 1118  ? BUN 15 02/06/2021 1121  ? BUN 14 06/30/2017 1815  ? CREATININE 0.62 02/06/2021 1121  ? CREATININE 0.72 06/07/2015 0825  ? CALCIUM 9.4 11/25/2020 1118  ? PROT 7.0 06/30/2017 1815  ? ALBUMIN 4.6 06/30/2017 1815  ? AST 22 06/30/2017 1815  ? ALT 17 06/30/2017 1815  ? ALKPHOS 89 06/30/2017 1815  ? BILITOT 0.6 06/30/2017 1815  ? GFRNONAA >60 02/06/2021 1121  ? GFRAA 108 06/30/2017 1815  ? ?Lab Results  ?Component Value Date  ? WBC 12.4 (H) 11/29/2020  ? NEUTROABS 3.8 06/30/2017  ? HGB 11.5 (L) 11/29/2020  ? HCT 32.8 (L) 11/29/2020  ? MCV 85.4 11/29/2020  ? PLT 226 11/29/2020  ? ?Imaging: ? ?[place] Clinician Interpretation: I have personally reviewed the CNS images as listed.  My interpretation, in the context of the patient's clinical presentation, is  treatment effect vs true progression ? ?MR BRAIN W WO CONTRAST ? ?Result Date: 05/31/2021 ?CLINICAL DATA:  Pleomorphic exam thoracic for cytoma. Brain tumor follow-up. EXAM: MRI HEAD WITHOUT AND WITH CONTRAST TECHNIQUE:

## 2021-06-04 ENCOUNTER — Telehealth: Payer: Self-pay | Admitting: Internal Medicine

## 2021-06-04 ENCOUNTER — Other Ambulatory Visit: Payer: Self-pay | Admitting: Radiation Therapy

## 2021-06-04 NOTE — Telephone Encounter (Signed)
Scheduled per 4/18 los, pt has been called and confirmed  ?

## 2021-06-09 ENCOUNTER — Inpatient Hospital Stay: Payer: BC Managed Care – PPO

## 2021-06-09 ENCOUNTER — Telehealth: Payer: Self-pay

## 2021-06-09 NOTE — Telephone Encounter (Signed)
Nutrition ? ?Patient identified on Malnutrition Screening report for weight loss and poor appetite.  ? ?Chart reviewed. ? ?Called patient but no answer.  Left message with call back number.  ? ?Jazper Nikolai B. Zenia Resides, RD, LDN ?Registered Dietitian ?[phone] ? ?

## 2021-06-10 ENCOUNTER — Ambulatory Visit
Admission: RE | Admit: 2021-06-10 | Discharge: 2021-06-10 | Disposition: A | Discharge status: Home / Self Care | Payer: BC Managed Care – PPO | Source: Ambulatory Visit | Attending: Physician Assistant | Admitting: Physician Assistant

## 2021-06-10 DIAGNOSIS — Z1231 Encounter for screening mammogram for malignant neoplasm of breast: Secondary | ICD-10-CM

## 2021-06-16 ENCOUNTER — Ambulatory Visit: Payer: BC Managed Care – PPO

## 2021-06-16 ENCOUNTER — Telehealth: Payer: BC Managed Care – PPO

## 2021-06-16 NOTE — Progress Notes (Signed)
Nutrition Assessment ? ? ?Reason for Assessment:  ?Patient identified on Malnutrition Screening report for weight loss ? ? ?ASSESSMENT:  ?66 year old female with right temporal pleomorphic xanthoastrocytoma.  S/p right temporal craniotomy in 2015, 2nd craniotomy in 2022 due to disease progression and radiation 02/21/21-04/02/21.   ?Patient on dexamethasone for inflammatory syndrome. ? ?Spoke with patient via phone and introduced self and service at [facility].  Patient reports that appetite has increased a little bit after 5 days of starting dexamethasone.  Eating small frequent meals, grazing during the day.  Has had issues with gas, diarrhea after starting steroid so monitoring what she is eating.  Ate chicken biscuit this am.  Has been eating chicken salad, tuna, eggs, yogurt, cheese, ramen noodes with chicken.  Likes 1% milk with protein powder added.  Ensure/boost shakes too sweet.  Has had more energy and walking some and yoga when able.  ? ? ?Medications: MVI, VIt D, dexamethasone ? ? ?Labs: reviewed ? ? ?Anthropometrics:  ? ?Height: 61 inches ?Weight: 153 lb on 4/18 ?160 lb 2/14 ?163 lb on 12/22 ?BMI: 28 ? ?4% weight loss in 2 months, concerning ? ? ?NUTRITION DIAGNOSIS: Unintentional weight loss related to cancer related treatment side effects as evidenced by 4% weight loss in the last 2 months ? ?INTERVENTION:  ?Encouraged small frequent meals, including good sources of protein ?Discussed foods to choose with diarrhea.  Will mail handout ?Will mail contact information. RD available if needed for further questions, concerns ? ? ? ?Next Visit: no follow-up ? ?Jolee Critcher B. Zenia Resides, RD, LDN ?Registered Dietitian ?[phone] ? ? ? ? ? ?

## 2021-06-17 ENCOUNTER — Inpatient Hospital Stay: Payer: BC Managed Care – PPO | Attending: Radiation Oncology | Admitting: Internal Medicine

## 2021-06-17 DIAGNOSIS — C712 Malignant neoplasm of temporal lobe: Secondary | ICD-10-CM | POA: Insufficient documentation

## 2021-06-17 DIAGNOSIS — C719 Malignant neoplasm of brain, unspecified: Secondary | ICD-10-CM

## 2021-06-17 MED ORDER — DEXAMETHASONE 1 MG PO TABS: ORAL_TABLET | ORAL | 0 refills | Status: DC

## 2021-06-17 NOTE — Progress Notes (Signed)
I connected with Lindsey Daniels on 06/17/21 at  2:00 PM EDT by telephone visit and verified that I am speaking with the correct person using two identifiers.  ?I discussed the limitations, risks, security and privacy concerns of performing an evaluation and management service by telemedicine and the availability of in-person appointments. I also discussed with the patient that there may be a patient responsible charge related to this service. The patient expressed understanding and agreed to proceed.  ?Other persons participating in the visit and their role in the encounter:  n/a  ?Patient's location:  [place]  ?Provider's location:  Office  ?Chief Complaint:  Pleomorphic xanthoastrocytoma ([place]) ? ?History of Present Ilness: Lindsey Daniels describes considerable improvement in cognition, memory, overall energy, and function at work since starting the decadron.  She does complain of some diarrhea and gassiness since starting the drug, but no other issues.  Otherwise no new or progressive complaints. ?Observations: Language and cognition at baseline ? ?Assessment and Plan: Pleomorphic xanthoastrocytoma ([place]) ? ?Clinically improved with corticosteroids.  Recommended decreasing decadron to '3mg'$  daily x 7days, then '2mg'$  daily thereafter. ? ?Follow Up Instructions: RTC in 2 weeks following scheduled MRI brain ? ?I discussed the assessment and treatment plan with the patient.  The patient was provided an opportunity to ask questions and all were answered.  The patient agreed with the plan and demonstrated understanding of the instructions.   ? ?The patient was advised to call back or seek an in-person evaluation if the symptoms worsen or if the condition fails to improve as anticipated.  I provided 5-10 minutes of non-face-to-face time during this enocunter. ? ?Ventura Sellers, MD ? ? ?I provided 15 minutes of non face-to-face telephone visit time during this encounter, and > 50% was spent counseling as documented under my  assessment & plan.  ? ?

## 2021-06-27 ENCOUNTER — Ambulatory Visit
Admission: RE | Admit: 2021-06-27 | Discharge: 2021-06-27 | Disposition: A | Discharge status: Home / Self Care | Payer: BC Managed Care – PPO | Source: Ambulatory Visit | Attending: Internal Medicine | Admitting: Internal Medicine

## 2021-06-27 DIAGNOSIS — C719 Malignant neoplasm of brain, unspecified: Secondary | ICD-10-CM

## 2021-06-27 MED ORDER — GADOBENATE DIMEGLUMINE 529 MG/ML IV SOLN
14.0000 mL | Freq: Once | INTRAVENOUS | Status: AC | PRN
  Administered 2021-06-27: 14 mL via INTRAVENOUS

## 2021-06-30 ENCOUNTER — Inpatient Hospital Stay: Payer: BC Managed Care – PPO

## 2021-07-01 ENCOUNTER — Other Ambulatory Visit: Payer: Self-pay

## 2021-07-01 ENCOUNTER — Inpatient Hospital Stay (HOSPITAL_BASED_OUTPATIENT_CLINIC_OR_DEPARTMENT_OTHER): Payer: BC Managed Care – PPO | Admitting: Internal Medicine

## 2021-07-01 VITALS — BP 147/89 | HR 74 | Temp 97.8°F | Resp 17 | Ht 61.0 in | Wt 161.8 lb

## 2021-07-01 DIAGNOSIS — C719 Malignant neoplasm of brain, unspecified: Secondary | ICD-10-CM

## 2021-07-01 DIAGNOSIS — C712 Malignant neoplasm of temporal lobe: Secondary | ICD-10-CM | POA: Diagnosis not present

## 2021-07-01 MED ORDER — DEXAMETHASONE 1 MG PO TABS: 2.0000 mg | ORAL_TABLET | Freq: Every day | ORAL | 1 refills | Status: DC

## 2021-07-01 NOTE — Progress Notes (Signed)
? ?[facility] at [address]  ?[place], [place] [address] ?[phone] ? ? ?Interva Evaluation ? ?Date of Service: 07/01/21 ?Patient Name: Lindsey Daniels ?Patient MRN: [id number] ?Patient DOB: 1955/09/09 ?Provider: Ventura Sellers, MD ? ?Identifying Statement:  ?Lindsey Daniels is a 66 y.o. female with right temporal  pleomorphic xanthoastrocytoma   ? ?Oncologic History: ?Oncology History  ?Pleomorphic xanthoastrocytoma ([place])  ?09/29/2013 Surgery  ? Right temporal craniotomy, resection with Dr. Sherwood Gambler.  Path is PXA WHO II. ?  ?11/29/2020 Surgery  ? Disease progression, second craniotomy with Dr. Christella Noa, sub-total resection.  Path is unchanged. ?  ?02/21/2021 - 04/02/2021 Radiation Therapy  ? IMRT radiation therapy with Dr. Lisbeth Renshaw ?  ? ? ?Biomarkers: ? ?MGMT Methylated.  ?IDH 1/2 Wild type.  ?BRAF V600e mutation detected  ?TERT Unmutated  ? ?Interval History: ?Lindsey Daniels presents for follow up after recent MRI brain.  She describes continued improvement with regards to her cognition, memory, activity level.  Remains active and independent.  Fatigue has improved overall.  No seizures. ? ?H+P (01/30/21) Patient presents today after recent re-resection for progressive right temporal PXA.  She initially presented with a seizure in 2015; imaging demonstrated enhancing right temporal lesion which was grossly resected by Dr. Sherwood Gambler.  After last follow up in 2020, she began to experience headaches, ear fullness and some confusion this fall.  Imaging demonstrated marked progression of disease; she went for second surgery on 11/29/20.  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  ? ?Medications: ?Current Outpatient Medications on File Prior to Visit  ?Medication Sig Dispense Refill  ? atorvastatin (LIPITOR) 20 MG tablet Take 1 tablet (20 mg total) by mouth daily. 90 tablet 3  ? cholecalciferol (VITAMIN D)  1000 units tablet Take 1,000 Units by mouth daily.    ? dexamethasone (DECADRON) 1 MG tablet Take 3 tablets (3 mg total) by mouth daily for 7 days, THEN 2 tablets (2 mg total) daily for 21 days. 63 tablet 0  ? fluticasone (FLONASE) 50 MCG/ACT nasal spray Place 1-2 sprays into both nostrils daily. 16 g 0  ? levETIRAcetam (KEPPRA) 500 MG tablet Take 1 tablet (500 mg total) by mouth 2 (two) times daily. 60 tablet 0  ? meloxicam (MOBIC) 15 MG tablet Take 15 mg by mouth 2 (two) times daily as needed for pain.    ? Multiple Vitamin (MULTIVITAMIN WITH MINERALS) TABS tablet Take 1 tablet by mouth at bedtime.    ? sertraline (ZOLOFT) 100 MG tablet Take 1 tablet (100 mg total) by mouth daily. 90 tablet 3  ? triamcinolone (NASACORT) 55 MCG/ACT AERO nasal inhaler Place 2 sprays into the nose at bedtime. In to each nostril 1 Inhaler 12  ? valACYclovir (VALTREX) 500 MG tablet Take 1 tablet (500 mg total) by mouth 2 (two) times daily. Takes only if needed for breakouts (Patient not taking: Reported on 04/01/2021) 30 tablet prn  ? ?No current facility-administered medications on file prior to visit.  ? ? ?Allergies:  ?Allergies  ?Allergen Reactions  ? Rifapentine Other (See Comments)  ?  Flu-like symptoms  ? Amoxicillin Rash  ? Clavulanic Acid Rash  ? ?Past Medical History:  ?Past Medical History:  ?Diagnosis Date  ? Allergy   ? Anal fissure   ? Anxiety   ? Cataract   ? Depression   ? Elevated LFTs   ? Hyperglycemia   ?  Hyperlipidemia   ? Hypertension   ? Osteoarthritis   ? Pars defect of lumbar spine   ? Seizures ([place])   ? Spondylolisthesis   ? lumbar  ? ?Past Surgical History:  ?Past Surgical History:  ?Procedure Laterality Date  ? cataract    ? CRANIOTOMY N/A 10/02/2013  ? Procedure: 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;  Surgeon: Hosie Spangle, MD;  Location: [facility];  Service: Neurosurgery;  Laterality: N/A;  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  ? CRANIOTOMY Right 11/29/2020  ? Procedure: Right  Parietal craniotomy for tumor resection;  Surgeon: Ashok Pall, MD;  Location: [facility];  Service: Neurosurgery;  Laterality: Right;  ? HEEL SPUR SURGERY    ? ?Social History:  ?Social History  ? ?Socioeconomic History  ? Marital status: Divorced  ?  Spouse name: separated-no papers file  ? Number of children: 0  ? Years of education: Not on file  ? Highest education level: Not on file  ?Occupational History  ? Occupation: RT  ?  Employer: [employer]  ?  Comment: [employer] and [employer]  ?Tobacco Use  ? Smoking status: Never  ? Smokeless tobacco: Never  ?Substance and Sexual Activity  ? Alcohol use: Yes  ?  Alcohol/week: 0.0 - 4.0 standard drinks  ?  Comment: social  ? Drug use: No  ? Sexual activity: Not Currently  ?  Partners: Male  ?Other Topics Concern  ? Not on file  ?Social History Narrative  ? Separated from second husband.  Very contentious split.  ? ?Social Determinants of Health  ? ?Financial Resource Strain: Not on file  ?Food Insecurity: Not on file  ?Transportation Needs: Not on file  ?Physical Activity: Not on file  ?Stress: Not on file  ?Social Connections: Not on file  ?Intimate Partner Violence: Not At Risk  ? Fear of Current or Ex-Partner: No  ? Emotionally Abused: No  ? Physically Abused: No  ? Sexually Abused: No  ? ?Family History:  ?Family History  ?Problem Relation Age of Onset  ? Mental illness Father   ?     depression  ? COPD Father   ? Alcohol abuse Brother   ? Breast cancer Neg Hx   ? ? ?Review of Systems: ?Constitutional: Doesn't report fevers, chills or abnormal weight loss ?Eyes: Doesn't report blurriness of vision ?Ears, nose, mouth, throat, and face: Doesn't report sore throat ?Respiratory: Doesn't report cough, dyspnea or wheezes ?Cardiovascular: Doesn't report palpitation, chest discomfort  ?Gastrointestinal:  Doesn't report nausea, constipation, diarrhea ?GU: Doesn't report incontinence ?Skin: Doesn't report skin rashes ?Neurological: Per  HPI ?Musculoskeletal: Doesn't report joint pain ?Behavioral/Psych: Doesn't report anxiety ? ?Physical Exam: ?Vitals:  ? 07/01/21 1224  ?BP: (!) 147/89  ?Pulse: 74  ?Resp: 17  ?Temp: 97.8 ?F (36.6 ?C)  ?SpO2: 97%  ? ?KPS: 90. ?General: Alert, cooperative, pleasant, in no acute distress ?Head: Normal ?EENT: No conjunctival injection or scleral icterus.  ?Lungs: Resp effort normal ?Cardiac: Regular rate ?Abdomen: Non-distended abdomen ?Skin: No rashes cyanosis or petechiae. ?Extremities: No clubbing or edema ? ?Neurologic Exam: ?Mental Status: Awake, alert, attentive to examiner. Oriented to self and environment. Language is fluent with intact comprehension.  ?Cranial Nerves: Visual acuity is grossly normal. Visual fields are full. Extra-ocular movements intact. No ptosis. Face is symmetric ?Motor: Tone and bulk are normal. Power is full in both arms and legs. Reflexes are symmetric, no pathologic reflexes present.  ?Sensory: Intact to  light touch ?Gait: Normal. ? ? ?Labs: ?I have reviewed the data as listed ?   ?Component Value Date/Time  ? NA 138 11/25/2020 1118  ? NA 142 06/30/2017 1815  ? K 3.3 (L) 11/25/2020 1118  ? CL 103 11/25/2020 1118  ? CO2 28 11/25/2020 1118  ? GLUCOSE 109 (H) 11/25/2020 1118  ? BUN 15 02/06/2021 1121  ? BUN 14 06/30/2017 1815  ? CREATININE 0.62 02/06/2021 1121  ? CREATININE 0.72 06/07/2015 0825  ? CALCIUM 9.4 11/25/2020 1118  ? PROT 7.0 06/30/2017 1815  ? ALBUMIN 4.6 06/30/2017 1815  ? AST 22 06/30/2017 1815  ? ALT 17 06/30/2017 1815  ? ALKPHOS 89 06/30/2017 1815  ? BILITOT 0.6 06/30/2017 1815  ? GFRNONAA >60 02/06/2021 1121  ? GFRAA 108 06/30/2017 1815  ? ?Lab Results  ?Component Value Date  ? WBC 12.4 (H) 11/29/2020  ? NEUTROABS 3.8 06/30/2017  ? HGB 11.5 (L) 11/29/2020  ? HCT 32.8 (L) 11/29/2020  ? MCV 85.4 11/29/2020  ? PLT 226 11/29/2020  ? ?Imaging: ? ?[place] Clinician Interpretation: I have personally reviewed the CNS images as listed.  My interpretation, in the context of the  patient's clinical presentation, is stable disease ? ?MR BRAIN W WO CONTRAST ? ?Result Date: 06/29/2021 ?CLINICAL DATA:  Pleomorphic exam thoracic or cytoma. EXAM: MRI HEAD WITHOUT AND WITH CONTRAST TECHNIQUE: Multip

## 2021-07-02 ENCOUNTER — Telehealth: Payer: Self-pay | Admitting: Internal Medicine

## 2021-07-02 NOTE — Telephone Encounter (Signed)
Scheduled per 5/16 los, message has been left with pt ?

## 2021-07-10 ENCOUNTER — Encounter: Payer: Self-pay | Admitting: Internal Medicine

## 2021-07-15 ENCOUNTER — Encounter: Payer: Self-pay | Admitting: Emergency Medicine

## 2021-07-15 ENCOUNTER — Emergency Department: Payer: BC Managed Care – PPO

## 2021-07-15 ENCOUNTER — Observation Stay
Admission: EM | Admit: 2021-07-15 | Discharge: 2021-07-16 | Disposition: A | Discharge status: Home / Self Care | Payer: BC Managed Care – PPO | Attending: Student | Admitting: Student

## 2021-07-15 ENCOUNTER — Other Ambulatory Visit: Payer: Self-pay

## 2021-07-15 DIAGNOSIS — F32A Depression, unspecified: Secondary | ICD-10-CM | POA: Diagnosis present

## 2021-07-15 DIAGNOSIS — R569 Unspecified convulsions: Secondary | ICD-10-CM | POA: Diagnosis not present

## 2021-07-15 DIAGNOSIS — C719 Malignant neoplasm of brain, unspecified: Secondary | ICD-10-CM | POA: Diagnosis not present

## 2021-07-15 DIAGNOSIS — G40919 Epilepsy, unspecified, intractable, without status epilepticus: Secondary | ICD-10-CM | POA: Insufficient documentation

## 2021-07-15 LAB — CBC WITH DIFFERENTIAL/PLATELET
Abs Immature Granulocytes: 0.36 10*3/uL — ABNORMAL HIGH (ref 0.00–0.07)
Basophils Absolute: 0.1 10*3/uL (ref 0.0–0.1)
Basophils Relative: 1 %
Eosinophils Absolute: 0.1 10*3/uL (ref 0.0–0.5)
Eosinophils Relative: 0 %
HCT: 45.4 % (ref 36.0–46.0)
Hemoglobin: 14.8 g/dL (ref 12.0–15.0)
Immature Granulocytes: 3 %
Lymphocytes Relative: 21 %
Lymphs Abs: 2.7 10*3/uL (ref 0.7–4.0)
MCH: 30.5 pg (ref 26.0–34.0)
MCHC: 32.6 g/dL (ref 30.0–36.0)
MCV: 93.6 fL (ref 80.0–100.0)
Monocytes Absolute: 0.9 10*3/uL (ref 0.1–1.0)
Monocytes Relative: 7 %
Neutro Abs: 9 10*3/uL — ABNORMAL HIGH (ref 1.7–7.7)
Neutrophils Relative %: 68 %
Platelets: 274 10*3/uL (ref 150–400)
RBC: 4.85 MIL/uL (ref 3.87–5.11)
RDW: 14.2 % (ref 11.5–15.5)
WBC: 13.1 10*3/uL — ABNORMAL HIGH (ref 4.0–10.5)
nRBC: 0 % (ref 0.0–0.2)

## 2021-07-15 LAB — BASIC METABOLIC PANEL
Anion gap: 17 — ABNORMAL HIGH (ref 5–15)
BUN: 16 mg/dL (ref 8–23)
CO2: 20 mmol/L — ABNORMAL LOW (ref 22–32)
Calcium: 9.6 mg/dL (ref 8.9–10.3)
Chloride: 104 mmol/L (ref 98–111)
Creatinine, Ser: 0.68 mg/dL (ref 0.44–1.00)
GFR, Estimated: >60 mL/min (ref >60)
Glucose, Bld: 126 mg/dL — ABNORMAL HIGH (ref 70–99)
Potassium: 3.7 mmol/L (ref 3.5–5.1)
Sodium: 141 mmol/L (ref 135–145)

## 2021-07-15 LAB — CBG MONITORING, ED: Glucose-Capillary: 141 mg/dL — ABNORMAL HIGH (ref 70–99)

## 2021-07-15 MED ORDER — LORAZEPAM 2 MG/ML IJ SOLN: 4.0000 mg | INTRAMUSCULAR | Status: DC | PRN

## 2021-07-15 MED ORDER — ORAL CARE MOUTH RINSE
15.0000 mL | OROMUCOSAL | Status: DC
  Administered 2021-07-15 – 2021-07-16 (×4): 15 mL via OROMUCOSAL
  Filled 2021-07-15 (×5): qty 15

## 2021-07-15 MED ORDER — ACETAMINOPHEN 650 MG RE SUPP: 650.0000 mg | RECTAL | Status: DC | PRN

## 2021-07-15 MED ORDER — LEVETIRACETAM IN NACL 1000 MG/100ML IV SOLN
1000.0000 mg | Freq: Once | INTRAVENOUS | Status: AC
  Administered 2021-07-15: 1000 mg via INTRAVENOUS
  Filled 2021-07-15 (×2): qty 100

## 2021-07-15 MED ORDER — ONDANSETRON HCL 4 MG PO TABS: 4.0000 mg | ORAL_TABLET | Freq: Four times a day (QID) | ORAL | Status: DC | PRN

## 2021-07-15 MED ORDER — CHLORHEXIDINE GLUCONATE 0.12% ORAL RINSE (MEDLINE KIT)
15.0000 mL | Freq: Two times a day (BID) | OROMUCOSAL | Status: DC
  Administered 2021-07-16: 15 mL via OROMUCOSAL
  Filled 2021-07-15: qty 15

## 2021-07-15 MED ORDER — LORAZEPAM 2 MG/ML IJ SOLN
2.0000 mg | Freq: Once | INTRAMUSCULAR | Status: AC
Start: 2021-07-15 — End: 2021-07-15
  Administered 2021-07-15: 2 mg via INTRAVENOUS
  Filled 2021-07-15: qty 1

## 2021-07-15 MED ORDER — SODIUM CHLORIDE 0.9 % IV SOLN: INTRAVENOUS | Status: DC

## 2021-07-15 MED ORDER — LEVETIRACETAM IN NACL 500 MG/100ML IV SOLN: 500.0000 mg | Freq: Two times a day (BID) | INTRAVENOUS | Status: DC

## 2021-07-15 MED ORDER — LEVETIRACETAM IN NACL 1000 MG/100ML IV SOLN
1000.0000 mg | Freq: Two times a day (BID) | INTRAVENOUS | Status: DC
  Administered 2021-07-15 – 2021-07-16 (×2): 1000 mg via INTRAVENOUS
  Filled 2021-07-15 (×3): qty 100

## 2021-07-15 MED ORDER — DEXAMETHASONE SODIUM PHOSPHATE 4 MG/ML IJ SOLN
4.0000 mg | Freq: Two times a day (BID) | INTRAMUSCULAR | Status: DC
  Administered 2021-07-15 – 2021-07-16 (×2): 4 mg via INTRAVENOUS
  Filled 2021-07-15 (×3): qty 1

## 2021-07-15 MED ORDER — ONDANSETRON HCL 4 MG/2ML IJ SOLN
4.0000 mg | Freq: Four times a day (QID) | INTRAMUSCULAR | Status: DC | PRN
Start: 2021-07-15 — End: 2021-07-16

## 2021-07-15 MED ORDER — ACETAMINOPHEN 325 MG PO TABS: 650.0000 mg | ORAL_TABLET | ORAL | Status: DC | PRN

## 2021-07-15 NOTE — Assessment & Plan Note (Addendum)
Patient was brought into the ER from outpatient imaging for evaluation of multiple witnessed seizures despite being compliant with her antiepileptic medications She received a dose of lorazepam 2 mg IV push as well as Keppra 1 g IV x1 We will continue Keppra 1000 mg IV twice daily.  Patient was on 500 mg twice daily at home Place patient on lorazepam for breakthrough seizures N.p.o. for now until mental status improves Patient's seizure and aspiration precautions Consult neurology

## 2021-07-15 NOTE — Assessment & Plan Note (Signed)
Hold sertraline until patient is more awake and alert

## 2021-07-15 NOTE — ED Triage Notes (Signed)
Arrives from outpatient imaging and had two seizures.  Patient states she felt the need to move bowels and staff noticed her grunting. Patient denies LOC.  States has not slept well over the past several days.  Has been taking prednisone, recently decreased her dose.  While in ED, patient had a seizure, leaned to right right arm contracted up and left arm moved across body.  Patient placed on side for safety.  Seizure activity lasted approximately 20 seconds.

## 2021-07-15 NOTE — ED Notes (Signed)
Pt with RA O2 sats 88-89% post seizure, post ativan. Placed on 3 LNC and turned to left side - sats 95%

## 2021-07-15 NOTE — Assessment & Plan Note (Addendum)
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 She presents to the ER after she had multiple witnessed seizures and CT scan of the head without contrast when compared with the MRI performed 18 days earlier, no gross change in the residual right temporal lobe tumor, surrounding vasogenic edema and associated mass effect. Discussed with patient's oncologist, Dr Mickeal Skinner who recommends to place patient on Decadron 4 mg IV twice daily Patient to follow-up with oncologist as an outpatient Continue antiepileptic meds

## 2021-07-15 NOTE — ED Provider Notes (Signed)
[facility] Provider Note    Event Date/Time   First MD Initiated Contact with Patient 07/15/21 1357     (approximate)   History   Seizures   HPI  Lindsey Daniels is a 66 y.o. female with [place] a brain cancer status post resection in 2022 and chemotherapy earlier this year, on Keppra for seizures, who presents with several seizures back-to-back.  The patient had a brief generalized seizure that was witnessed when she came to the hospital to get imaging done.  She then had a brief seizure with EMS and another on arrival to the ED room.  The patient currently appears sleepy but states she is not having any headache or other acute symptoms.  She is unable to tell me when her last seizure was.  She states that she has been taking her Keppra as prescribed.    Physical Exam   Triage Vital Signs: ED Triage Vitals  Enc Vitals Group     BP 07/15/21 1430 (!) 149/93     Pulse Rate 07/15/21 1403 92     Resp 07/15/21 1403 20     Temp 07/15/21 1430 98.6 F (37 C)     Temp Source 07/15/21 1430 Oral     SpO2 07/15/21 1403 95 %     Weight 07/15/21 1401 161 lb 9.6 oz (73.3 kg)     Height 07/15/21 1401 _0  (1.549 m)     Head Circumference --      Peak Flow --      Pain Score 07/15/21 1401 0     Pain Loc --      Pain Edu? --      Excl. in [place]? --     Most recent vital signs: Vitals:   07/15/21 1500 07/15/21 1530  BP: (!) 152/94 (!) 151/84  Pulse: 77 82  Resp: 20 15  Temp:    SpO2: 99% 97%     General: Somnolent but arousable, oriented x4. CV:  Good peripheral perfusion.  Resp:  Normal effort.  Abd:  No distention.  Other:  EOMI.  PERRLA.  Motor intact in all extremities with some left upper and lower extremity weakness compared to the right.   ED Results / Procedures / Treatments   Labs (all labs ordered are listed, but only abnormal results are displayed) Labs Reviewed  BASIC METABOLIC PANEL - Abnormal; Notable for the following components:       Result Value   CO2 20 (*)    Glucose, Bld 126 (*)    Anion gap 17 (*)    All other components within normal limits  CBC WITH DIFFERENTIAL/PLATELET - Abnormal; Notable for the following components:   WBC 13.1 (*)    Neutro Abs 9.0 (*)    Abs Immature Granulocytes 0.36 (*)    All other components within normal limits  CBG MONITORING, ED - Abnormal; Notable for the following components:   Glucose-Capillary 141 (*)    All other components within normal limits     EKG  ED ECG REPORT I, Arta Silence, the attending physician, personally viewed and interpreted this ECG.  Date: 07/15/2021 EKG Time: 1419 Rate: 88 Rhythm: normal sinus rhythm QRS Axis: normal Intervals: LBBB ST/T Wave abnormalities: normal Narrative Interpretation: no evidence of acute ischemia; no significant change when compared to EKG of 11/25/2020    RADIOLOGY  CT head: I dependently viewed and interpreted the images; there is no evidence of acute hemorrhage.  Radiology report confirms  no acute change in tumor size, mass effect, or edema when compared to recent MRI.  PROCEDURES:  Critical Care performed: Yes, see critical care procedure note(s)  .Critical Care Performed by: Arta Silence, MD Authorized by: Arta Silence, MD   Critical care provider statement:    Critical care time (minutes):  15   Critical care was necessary to treat or prevent imminent or life-threatening deterioration of the following conditions:  CNS failure or compromise   Critical care was time spent personally by me on the following activities:  Development of treatment plan with patient or surrogate, discussions with consultants, evaluation of patient's response to treatment, examination of patient, ordering and review of laboratory studies, ordering and review of radiographic studies, ordering and performing treatments and interventions, pulse oximetry, re-evaluation of patient's condition and review of old  [facility] ED: Medications  dexamethasone (DECADRON) injection 4 mg (has no administration in time range)  chlorhexidine gluconate (MEDLINE KIT) (PERIDEX) 0.12 % solution 15 mL (has no administration in time range)  MEDLINE mouth rinse (has no administration in time range)  LORazepam (ATIVAN) injection 4 mg (has no administration in time range)  levETIRAcetam (KEPPRA) IVPB 500 mg/100 mL premix (has no administration in time range)  acetaminophen (TYLENOL) tablet 650 mg (has no administration in time range)    Or  acetaminophen (TYLENOL) suppository 650 mg (has no administration in time range)  ondansetron (ZOFRAN) tablet 4 mg (has no administration in time range)    Or  ondansetron (ZOFRAN) injection 4 mg (has no administration in time range)  0.9 %  sodium chloride infusion (has no administration in time range)  levETIRAcetam (KEPPRA) IVPB 1000 mg/100 mL premix (1,000 mg Intravenous New Bag/Given 07/15/21 1431)  LORazepam (ATIVAN) injection 2 mg (2 mg Intravenous Given 07/15/21 1408)     IMPRESSION / MDM / [place] / ED COURSE  I reviewed the triage vital signs and the nursing notes.  66 year old female with PMH as noted above presents with several back-to-back brief seizures.  I reviewed the past medical records.  The patient is status post tumor resections for PSA brain cancer in 2015 and 2022 and follows with the cancer center here.  She was last seen on 07/01/2021.  She is on Keppra for seizure prophylaxis.  On exam the patient is slightly somnolent but arousable and is oriented.  She has normal extraocular movements, no neglect, and is moving all extremities although seems slightly weaker on the left which is consistent with the location of her tumor, and it appears that this is chronic.  Differential diagnosis includes, but is not limited to, breakthrough seizure related to her brain tumor versus seizure secondary to other primary etiology or less  likely nonepileptic seizure.  The patient is appropriately responsive with nonfocal neuro exam after the witnessed seizure here and there is no evidence of status epilepticus.  We will give Keppra and Ativan, obtain labs, CT head, neuro consult and reassess.  Patient's presentation is most consistent with acute presentation with potential threat to life or bodily function.  The patient is on the cardiac monitor to evaluate for evidence of arrhythmia and/or significant heart rate changes.  ----------------------------------------- 4:33 PM on 07/15/2021 -----------------------------------------  CT head shows residual mass, edema, midline shift, all unchanged from the patient's most recent MRI few weeks ago.  There is no hemorrhage or other acute finding.  Lab work-up reveals mild leukocytosis and elevated anion gap consistent with the recent seizures.  On  reassessment, the patient is somnolent after the Ativan but arousable.  I consulted Dr. Cheral Marker from neurology and discussed case with him.  He agrees with the management so far and recommends admission for further monitoring.  I then consulted Dr. Francine Graven from the hospitalist service; based on her discussion she agrees to admit the patient.    FINAL CLINICAL IMPRESSION(S) / ED DIAGNOSES   Final diagnoses:  Seizure ([place])     Rx / DC Orders   ED Discharge Orders     None        Note:  This document was prepared using Dragon voice recognition software and may include unintentional dictation errors.    Arta Silence, MD 07/15/21 [phone]

## 2021-07-15 NOTE — H&P (Addendum)
Back.  History and Physical    Patient: Lindsey Daniels HKV:[id number] DOB: 1956/02/03 DOA: 07/15/2021 DOS: the patient was seen and examined on 07/15/2021 PCP: Harrison Mons, [place]  Patient coming from: Home  Chief Complaint:  Chief Complaint  Patient presents with   Seizures   Most of the history was obtained from EMR as patient is very lethargic and unable to provide any history. HPI: Lindsey Daniels is a 66 y.o. 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 She had another witnessed seizure in the ER lasted about 20 seconds and received a dose of Ativan 2 mg IV as well as a loading dose of Keppra 1 g x 1. Patient appears to be very lethargic and is unable to provide any history at this time but chart review notes that she has not slept well over the last several days and has been on a prednisone taper. Postseizure she had room air pulse oximetry between 88 and 89% and was placed on 3 L of oxygen with improvement in her pulse oximetry to 95%. I am unable to do a review of systems on this patient due to her current condition Review of systems: Unable to do review of systems on this patient due to her mental status. Past Medical History:  Diagnosis Date   Allergy    Anal fissure    Anxiety    Cataract    Depression    Elevated LFTs    Hyperglycemia    Hyperlipidemia    Hypertension    Osteoarthritis    Pars defect of lumbar spine    Seizures (HCC)    Spondylolisthesis    lumbar   Past Surgical History:  Procedure Laterality Date   cataract     CRANIOTOMY N/A 10/02/2013   Procedure: Craniotomy for  resection of tumor with stealth;  Surgeon: Hosie Spangle, MD;  Location: [place] [facility];  Service: Neurosurgery;  Laterality: N/A;  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   CRANIOTOMY Right 11/29/2020   Procedure: Right Parietal craniotomy for tumor resection;  Surgeon: Ashok Pall, MD;  Location: [facility];  Service: Neurosurgery;  Laterality: Right;   HEEL SPUR SURGERY     Social History:  reports that she has never smoked. She has never used smokeless tobacco. She reports current alcohol use. She reports that she does not use drugs.  Allergies  Allergen Reactions   Rifapentine Other (See Comments)    Flu-like symptoms   Amoxicillin Rash   Clavulanic Acid Rash    Family History  Problem Relation Age of Onset   Mental illness Father        depression   COPD Father    Alcohol abuse Brother    Breast cancer Neg Hx     Prior to Admission medications   Medication Sig Start Date End Date Taking? Authorizing Provider  atorvastatin (LIPITOR) 20 MG tablet Take 1 tablet (20 mg total) by mouth daily. 07/25/17   Harrison Mons, PA  cholecalciferol (VITAMIN D) 1000 units tablet Take 1,000 Units by mouth daily.    [provider]  dexamethasone (DECADRON) 1 MG tablet Take 2 tablets (2 mg total) by mouth daily. 07/01/21  Ventura Sellers, MD  fluticasone (FLONASE) 50 MCG/ACT nasal spray Place 1-2 sprays into both nostrils daily. 11/06/20   Wieters, Hallie C, PA-C  levETIRAcetam (KEPPRA) 500 MG tablet Take 1 tablet (500 mg total) by mouth 2 (two) times daily. 08/19/13   Domenic Polite, MD  meloxicam (MOBIC) 15 MG tablet Take 15 mg by mouth 2 (two) times daily as needed for pain.    [provider]  Multiple Vitamin (MULTIVITAMIN WITH MINERALS) TABS tablet Take 1 tablet by mouth at bedtime.    [provider]  sertraline (ZOLOFT) 100 MG tablet Take 1 tablet (100 mg total) by mouth daily. 06/30/17   Harrison Mons, PA  triamcinolone (NASACORT) 55 MCG/ACT AERO nasal  inhaler Place 2 sprays into the nose at bedtime. In to each nostril 12/20/18   Rozetta Nunnery, MD  valACYclovir (VALTREX) 500 MG tablet Take 1 tablet (500 mg total) by mouth 2 (two) times daily. Takes only if needed for breakouts Patient not taking: Reported on 04/01/2021 06/30/17   Harrison Mons, PA    Physical Exam: Vitals:   07/15/21 1403 07/15/21 1430 07/15/21 1500 07/15/21 1530  BP:  (!) 149/93 (!) 152/94 (!) 151/84  Pulse: 92 82 77 82  Resp: '20 16 20 15  '$ Temp:  98.6 F (37 C)    TempSrc:  Oral    SpO2: 95% 96% 99% 97%  Weight:      Height:       Physical Exam Vitals and nursing note reviewed.  Constitutional:      Comments: Lethargic but opens eyes to loud verbal stimuli.  Unable to stay unable to provide any history  HENT:     Head: Normocephalic and atraumatic.     Nose: Nose normal.     Mouth/Throat:     Mouth: Mucous membranes are moist.  Cardiovascular:     Rate and Rhythm: Normal rate and regular rhythm.  Pulmonary:     Effort: Pulmonary effort is normal.     Breath sounds: Normal breath sounds.  Abdominal:     General: Abdomen is flat. Bowel sounds are normal.     Palpations: Abdomen is soft.  Musculoskeletal:        General: Normal range of motion.     Cervical back: Normal range of motion and neck supple.  Skin:    General: Skin is warm and dry.  Neurological:     Comments: Unable to assess  Psychiatric:     Comments: Unable to assess    Data Reviewed: Relevant notes from primary care and specialist visits, past discharge summaries as available in EHR, including Care Everywhere. Prior diagnostic testing as pertinent to current admission diagnoses Updated medications and problem lists for reconciliation ED course, including vitals, labs, imaging, treatment and response to treatment Triage notes, nursing and pharmacy notes and ED provider's notes Notable results as noted in HPI Labs reviewed.  Sodium 141, potassium 3.7, chloride 104, bicarb 20,  glucose 126, BUN 16, creatinine 0.68, calcium 9.6, white count 13.1, hemoglobin 14.8, hematocrit 45.4, platelet count 274 CT scan of the head without contrast shows compared with the MRI performed 18 days earlier, no gross change in the residual right temporal lobe tumor, surrounding vasogenic edema and associated mass effect. No evidence of acute intracranial hemorrhage or hydrocephalus. Twelve-lead EKG reviewed by me shows sinus rhythm with a left bundle branch block. There are no new results to review at this time.  Assessment and Plan: * Seizure [facility]) Patient was brought  into the ER from outpatient imaging for evaluation of multiple witnessed seizures despite being compliant with her antiepileptic medications She received a dose of lorazepam 2 mg IV push as well as Keppra 1 g IV x1 We will continue Keppra 1000 mg IV twice daily.  Patient was on 500 mg twice daily at home Place patient on lorazepam for breakthrough seizures N.p.o. for now until mental status improves Patient's seizure and aspiration precautions Consult neurology  Depression Hold sertraline until patient is more awake and alert  Pleomorphic xanthoastrocytoma [facility]) Patient has a known history of pleomorphic xanthoastrocytoma and is status post resection x 2 as well as radiation therapy. She presents to the ER after she had multiple witnessed seizures and CT scan of the head without contrast when compared with the MRI performed 18 days earlier, no gross change in the residual right temporal lobe tumor, surrounding vasogenic edema and associated mass effect. Discussed with patient's oncologist, Dr Mickeal Skinner who recommends to place patient on Decadron 4 mg IV twice daily Patient to follow-up with oncologist as an outpatient Continue antiepileptic meds      Advance Care Planning:   Code Status: Full Code   Consults: Neurology  Family Communication: N/A  Severity of Illness: The appropriate patient status for this  patient is INPATIENT. Inpatient status is judged to be reasonable and necessary in order to provide the required intensity of service to ensure the patient's safety. The patient's presenting symptoms, physical exam findings, and initial radiographic and laboratory data in the context of their chronic comorbidities is felt to place them at high risk for further clinical deterioration. Furthermore, it is not anticipated that the patient will be medically stable for discharge from the hospital within 2 midnights of admission.   * I certify that at the point of admission it is my clinical judgment that the patient will require inpatient hospital care spanning beyond 2 midnights from the point of admission due to high intensity of service, high risk for further deterioration and high frequency of surveillance required.*  Author: Collier Bullock, MD 07/15/2021 4:44 PM  For on call review [email or web address].

## 2021-07-16 ENCOUNTER — Encounter: Payer: Self-pay | Admitting: Internal Medicine

## 2021-07-16 DIAGNOSIS — G40919 Epilepsy, unspecified, intractable, without status epilepticus: Secondary | ICD-10-CM

## 2021-07-16 DIAGNOSIS — F32A Depression, unspecified: Secondary | ICD-10-CM | POA: Diagnosis not present

## 2021-07-16 DIAGNOSIS — C719 Malignant neoplasm of brain, unspecified: Secondary | ICD-10-CM | POA: Diagnosis not present

## 2021-07-16 DIAGNOSIS — R569 Unspecified convulsions: Secondary | ICD-10-CM | POA: Diagnosis not present

## 2021-07-16 LAB — CBC
HCT: 39.9 % (ref 36.0–46.0)
Hemoglobin: 13.5 g/dL (ref 12.0–15.0)
MCH: 30.1 pg (ref 26.0–34.0)
MCHC: 33.8 g/dL (ref 30.0–36.0)
MCV: 88.9 fL (ref 80.0–100.0)
Platelets: 186 10*3/uL (ref 150–400)
RBC: 4.49 MIL/uL (ref 3.87–5.11)
RDW: 14.1 % (ref 11.5–15.5)
WBC: 9.3 10*3/uL (ref 4.0–10.5)
nRBC: 0 % (ref 0.0–0.2)

## 2021-07-16 LAB — BASIC METABOLIC PANEL
Anion gap: 8 (ref 5–15)
BUN: 14 mg/dL (ref 8–23)
CO2: 25 mmol/L (ref 22–32)
Calcium: 9.1 mg/dL (ref 8.9–10.3)
Chloride: 100 mmol/L (ref 98–111)
Creatinine, Ser: 0.44 mg/dL (ref 0.44–1.00)
GFR, Estimated: >60 mL/min (ref >60)
Glucose, Bld: 134 mg/dL — ABNORMAL HIGH (ref 70–99)
Potassium: 4.4 mmol/L (ref 3.5–5.1)
Sodium: 133 mmol/L — ABNORMAL LOW (ref 135–145)

## 2021-07-16 MED ORDER — LEVETIRACETAM 250 MG PO TABS: 750.0000 mg | ORAL_TABLET | Freq: Two times a day (BID) | ORAL | 2 refills | Status: DC

## 2021-07-16 MED ORDER — DEXAMETHASONE 4 MG PO TABS: 4.0000 mg | ORAL_TABLET | Freq: Every day | ORAL | 0 refills | Status: DC

## 2021-07-16 NOTE — TOC Initial Note (Signed)
Transition of Care [facility]) - Initial/Assessment Note    Patient Details  Name: Lindsey Daniels MRN: [id number] Date of Birth: 04/30/1955  Transition of Care [facility]) CM/SW Contact:    Laurena Slimmer, RN Phone Number: 07/16/2021, 9:38 AM  Clinical Narrative:                  Transition of Care [facility]) Screening Note   Patient Details  Name: Lindsey Daniels Date of Birth: [birth year]   Transition of Care [facility]) CM/SW Contact:    Laurena Slimmer, RN Phone Number: 07/16/2021, 9:38 AM    Transition of Care Department [facility]) has reviewed patient and no TOC needs have been identified at this time. We will continue to monitor patient advancement through interdisciplinary progression rounds. If new patient transition needs arise, please place a TOC consult.          Patient Goals and CMS Choice        Expected Discharge Plan and Services                                                Prior Living Arrangements/Services                       Activities of Daily Living Home Assistive Devices/Equipment: None ADL Screening (condition at time of admission) Patient's cognitive ability adequate to safely complete daily activities?: Yes Is the patient deaf or have difficulty hearing?: No Does the patient have difficulty seeing, even when wearing glasses/contacts?: No Does the patient have difficulty concentrating, remembering, or making decisions?: No Patient able to express need for assistance with ADLs?: No Does the patient have difficulty dressing or bathing?: No Independently performs ADLs?: Yes (appropriate for developmental age) Does the patient have difficulty walking or climbing stairs?: No Weakness of Legs: None Weakness of Arms/Hands: None  Permission Sought/Granted                  Emotional Assessment              Admission diagnosis:  Seizure [facility]) [R56.9] Patient Active Problem List   Diagnosis Date Noted   Seizure ([place]) 07/15/2021    Depression 07/15/2021   Protein-calorie malnutrition, severe 11/28/2020   Cerebral edema ([place]) 11/25/2020   Brain tumor ([place]) 11/25/2020   Increased body mass index 05/09/2019   Seasonal allergies 11/21/2016   Loss of height 11/21/2016   Elevated BP without diagnosis of hypertension 11/21/2016   Exposure to TB 06/07/2015   HSV infection 06/07/2015   Depressed mood 06/07/2015   Carpal tunnel syndrome 03/06/2014   Pleomorphic xanthoastrocytoma ([place]) 10/02/2013   Syncope 08/18/2013   Convulsions/seizures ([place]) 08/18/2013   History of benign neoplasm of brain 02/16/2013   Bilateral leg pain 09/07/2012   Foot pain, bilateral 09/07/2012   Plantar fasciitis BILATERAL 08/25/2012   Hyperlipidemia 12/24/2011   PCP:  Harrison Mons, PA Pharmacy:   [facility] [place] [place] [facility] [address] [place] [address] Phone: [phone] Fax: [phone]     Social Determinants of Health (SDOH) Interventions    Readmission Risk Interventions     View : No data to display.

## 2021-07-16 NOTE — Procedures (Signed)
Patient Name: Lindsey Daniels  MRN: [id number]  Epilepsy Attending: Lora Havens  Referring Physician/Provider: Kerney Elbe, MD Date: 07/16/2021 Duration: 21.02 mins  Patient history: 66yo F brought into the ER from outpatient imaging for evaluation of multiple witnessed seizures despite being compliant with her antiepileptic medications. EEG to evaluate for seizure  Level of alertness: Awake, asleep  AEDs during EEG study: LEV  Technical aspects: This EEG study was done with scalp electrodes positioned according to the 10-20 International system of electrode placement. Electrical activity was acquired at a sampling rate of '500Hz'$  and reviewed with a high frequency filter of '70Hz'$  and a low frequency filter of '1Hz'$ . EEG data were recorded continuously and digitally stored.   Description: The posterior dominant rhythm consists of 9 Hz activity of moderate voltage (25-35 uV) seen predominantly in posterior head regions, asymmetric ( right<left) and reactive to eye opening and eye closing. Sleep was characterized by vertex waves, sleep spindles (12 to 14 Hz), maximal frontocentral region.  EEG showed continuous sharply contoured 3 to 5 Hz theta-delta slowing in right hemisphere, maximal right temporal region.  Hyperventilation and photic stimulation were not performed.     ABNORMALITY - Continuous slow, right hemisphere, maximal right temporal region - Background asymmetry, right< left  IMPRESSION: This study is suggestive of cortical dysfunction in right hemisphere, maximal right temporal region likely secondary to underlying structural abnormality.  No seizures or definite epileptiform discharges were seen throughout the recording.    [name]

## 2021-07-16 NOTE — Progress Notes (Signed)
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  Pt admitted with seizures.  Pt out of room at time of visit.   Per MD notes, anticipate discharge today.   Weight has been stable over the past 5 months.   Body mass index is 30.53 kg/m. Patient meets criteria for obesity, class I based on current BMI.   Current diet order is regular, patient is consuming approximately 100% of meals at this time. Labs and medications reviewed.   No nutrition interventions warranted at this time. If nutrition issues arise, please consult RD.   Loistine Chance, RD, LDN, [place] Registered Dietitian II Certified Diabetes Care and Education Specialist Please refer to [facility] for RD and/or RD on-call/weekend/after hours pager

## 2021-07-16 NOTE — Progress Notes (Signed)
Eeg done 

## 2021-07-16 NOTE — Consult Note (Signed)
                    NEURO HOSPITALIST CONSULT NOTE   Requestig physician: Dr. Gonfa  Reason for Consult: Breakthrough seizure  History obtained from:   Patient and Chart     HPI:                                                                                                                                          Lindsey Daniels is an 66 y.o. 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 After arriving to the ED, she had a 3rd seizure lasting for about 20 seconds. Ativan 2 mg IV and 1000 mg Keppra were administered. She was postictal afterwards. She has been sleep deprived for the last several days. He Neuro-oncologist is Dr. Vaslow.   CT head in the ED revealed no gross change relative to the MRI performed 18 days earlier. In the residual right temporal lobe tumor, surrounding vasogenic edema and associated mass effect were noted.   ED labs were essentially unremarkable except for low CO2 on chem7 with elevated anion gap post-seizure, as well as mild leukocytosis on CBC that has since resolved.   Past Medical History:  Diagnosis Date   Allergy    Anal fissure    Anxiety    Cataract    Depression    Elevated LFTs    Hyperglycemia    Hyperlipidemia    Hypertension    Osteoarthritis    Pars defect of lumbar spine    Seizures (HCC)    Spondylolisthesis    lumbar    Past Surgical History:  Procedure Laterality Date   cataract     CRANIOTOMY N/A 10/02/2013   Procedure: 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;  Surgeon: Robert W Nudelman, MD;  Location: [facility];  Service: Neurosurgery;  Laterality: N/A;  Craniotomy for resection of tumor  with stealth   CRANIOTOMY Right 11/29/2020   Procedure: Right Parietal craniotomy for tumor resection;  Surgeon: Cabbell, Kyle, MD;  Location: [facility] OR;  Service: Neurosurgery;  Laterality: Right;   HEEL SPUR SURGERY      Family History  Problem Relation Age of Onset   Mental illness Father        depression   COPD Father    Alcohol abuse Brother    Breast cancer Neg Hx             Social History:  reports that she has never smoked. She has never used smokeless tobacco. She reports current alcohol use. She reports that she does   not use drugs.  Allergies  Allergen Reactions   Rifapentine Other (See Comments)    Flu-like symptoms   Amoxicillin Rash   Clavulanic Acid Rash    MEDICATIONS:                                                                                                                     Prior to Admission:  Medications Prior to Admission  Medication Sig Dispense Refill Last Dose   atorvastatin (LIPITOR) 20 MG tablet Take 1 tablet (20 mg total) by mouth daily. 90 tablet 3 07/15/2021   cholecalciferol (VITAMIN D) 1000 units tablet Take 1,000 Units by mouth daily.   07/15/2021   fluticasone (FLONASE) 50 MCG/ACT nasal spray Place 1-2 sprays into both nostrils daily. 16 g 0 Past Week   Multiple Vitamin (MULTIVITAMIN WITH MINERALS) TABS tablet Take 1 tablet by mouth at bedtime.   07/15/2021   sertraline (ZOLOFT) 100 MG tablet Take 1 tablet (100 mg total) by mouth daily. 90 tablet 3 07/15/2021   [DISCONTINUED] dexamethasone (DECADRON) 1 MG tablet Take 2 tablets (2 mg total) by mouth daily. 60 tablet 1 07/15/2021   [DISCONTINUED] levETIRAcetam (KEPPRA) 500 MG tablet Take 1 tablet (500 mg total) by mouth 2 (two) times daily. 60 tablet 0 07/15/2021   meloxicam (MOBIC) 15 MG tablet Take 15 mg by mouth 2 (two) times daily as needed for pain. (Patient not taking: Reported on 07/15/2021)   Not Taking   triamcinolone (NASACORT) 55 MCG/ACT AERO nasal inhaler Place 2 sprays into the nose at  bedtime. In to each nostril 1 Inhaler 12    valACYclovir (VALTREX) 500 MG tablet Take 1 tablet (500 mg total) by mouth 2 (two) times daily. Takes only if needed for breakouts (Patient not taking: Reported on 04/01/2021) 30 tablet prn    Scheduled:  chlorhexidine gluconate (MEDLINE KIT)  15 mL Mouth Rinse BID   dexamethasone (DECADRON) injection  4 mg Intravenous Q12H   mouth rinse  15 mL Mouth Rinse 10 times per day   Continuous:  levETIRAcetam 1,000 mg (07/16/21 0856)     ROS:                                                                                                                                       As per HPI. Does not endorse any additional complaints. No recurrence of seizure activity today.    Blood pressure  135/78, pulse 63, temperature 97.9 F (36.6 C), resp. rate 17, height 5' 1" (1.549 m), weight 73.3 kg, SpO2 95 %.   General Examination:                                                                                                       Physical Exam  HEENT- Evidence for prior right craniotomy.    Lungs- Respirations unlabored Extremities- No edema  Neurological Examination Mental Status: Awake, alert and oriented. Speech fluent with intact comprehension and naming.  Cranial Nerves: II: Visual fields intact in all 4 quadrants of each eye. No extinction to DSS. PERRL.   III,IV, VI: No ptosis. EOMI without nystagmus.  V: Temp sensation equal bilaterally.  VII: Smile symmetric  VIII: Hearing intact to voice IX,X: No hypophonia XI: Symmetric shoulder shrug XII: Midline tongue extension Motor: RUE 5/5 proximally and distally RLE 5/5 proximally and distally LUE 5/5 proximally and distally LLE 5/5 proximally and distally No pronator drift Sensory: Temp and light touch sensation intact throughout, bilaterally. Positive for extinction on the left with DSS.  Deep Tendon Reflexes: 2+ and symmetric throughout, slightly brisker on the left.  Plantars: Right:  downgoing  Left: downgoing Cerebellar: No ataxia with FNF bilaterally  Gait: Deferred   Lab Results: Basic Metabolic Panel: Recent Labs  Lab 07/15/21 1412  NA 141  K 3.7  CL 104  CO2 20*  GLUCOSE 126*  BUN 16  CREATININE 0.68  CALCIUM 9.6    CBC: Recent Labs  Lab 07/15/21 1412 07/16/21 0517  WBC 13.1* 9.3  NEUTROABS 9.0*  --   HGB 14.8 13.5  HCT 45.4 39.9  MCV 93.6 88.9  PLT 274 186    Cardiac Enzymes: No results for input(s): CKTOTAL, CKMB, CKMBINDEX, TROPONINI in the last 168 hours.  Lipid Panel: No results for input(s): CHOL, TRIG, HDL, CHOLHDL, VLDL, LDLCALC in the last 168 hours.  Imaging: CT Head Wo Contrast  Result Date: 07/15/2021 CLINICAL DATA:  Seizure disorder. Recent seizures. History of craniotomy for pleomorphic xanthoastrocytoma. EXAM: CT HEAD WITHOUT CONTRAST TECHNIQUE: Contiguous axial images were obtained from the base of the skull through the vertex without intravenous contrast. RADIATION DOSE REDUCTION: This exam was performed according to the departmental dose-optimization program which includes automated exposure control, adjustment of the mA and/or kV according to patient size and/or use of iterative reconstruction technique. COMPARISON:  CT head 11/25/2020.  MRI head 06/27/2021. FINDINGS: Brain: Stable postsurgical changes related to right temporal craniotomy. As demonstrated on MRI, there is a heterogeneous mass in the right temporal lobe which measures approximately 4.5 x 3.4 cm on image 11/2, similar to previous MRI. There is surrounding vasogenic edema in the right temporal, parietal and frontal lobes is unchanged. There is right to left midline shift by 6 mm, similar to recent MRI, and improved from previous CT. No evidence of acute intracranial hemorrhage, hydrocephalus or extra-axial fluid collection. Vascular:  No hyperdense vessel identified. Skull: Stable right temporal craniotomy changes. No acute osseous findings. Sinuses/Orbits: The  visualized paranasal sinuses and mastoid air cells are clear. No   orbital abnormalities are seen. Other: None. IMPRESSION: 1. Compared with the MRI performed 18 days earlier, no gross change in the residual right temporal lobe tumor, surrounding vasogenic edema and associated mass effect. 2. No evidence of acute intracranial hemorrhage or hydrocephalus. Electronically Signed   By: William  Veazey M.D.   On: 07/15/2021 15:07     Assessment: 66 y.o. 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 She has been compliant with her home anticonvulsant regimen.  - Neurological exam reveals extinction on the left with DSS and slightly brisker reflexes on the left, but is otherwise unremarkable.  - CT head in the ED reveals no gross change relative to the MRI performed 18 days earlier. In the residual right temporal lobe tumor, surrounding vasogenic edema and associated mass effect were noted.  - MRI brain, 06/27/21: Heterogeneously enhancing mass centered in the right temporal lobe with irregular primarily peripheral enhancement that is unchanged from before. Dimensions measure up to 4.2 x 4.6 x 3.6 cm, unchanged when remeasured in a similar fashion. Perfusion imaging was obtained and shows no differentiating feature between radiation necrosis and tumor. The degree of adjacent T2 hyperintensity with swelling in the right cerebral hemisphere and deep white matter tracks is also unchanged with midline shift measuring up to 5 mm. No complicating infarct, hydrocephalus, or collection. - EEG: Continuous slow, right hemisphere, maximal right temporal region. Also with background asymmetry. The  EEG findings are suggestive of cortical dysfunction in right hemisphere, maximal right temporal region likely secondary to underlying structural abnormality.  No seizures or definite epileptiform discharges were seen throughout the recording.   Recommendations: 1. Dr. Vaslow, her Neuro-oncologist, recommeded placing the patient on Decadron at 4 mg IV BID while inpatient. This should be reduced to 4 mg po qd on discharge.  2. Keppra now at 1000 mg IV BID. Decrease to 750 mg po BID prior to discharge.  3. Outpatient follow up with Dr. Vaslow.   Electronically signed: Dr.   07/16/2021, 8:07 AM       

## 2021-07-16 NOTE — Discharge Summary (Signed)
Physician Discharge Summary  Lindsey Daniels YBO:[id number] DOB: Sep 01, 1955 DOA: 07/15/2021  PCP: Harrison Mons, PA  Admit date: 07/15/2021 Discharge date: 07/16/2021 Admitted From: Home Disposition: Home Recommendations for Outpatient Follow-up:  Follow ups as below. Neurooncology to arrange outpatient follow-up. Please follow up on the following pending results: None  Home Health: Not indicated Equipment/Devices: Not indicated  Discharge Condition: Stable CODE STATUS: Full code  Follow-up Information     Harrison Mons, PA Follow up.   Specialty: Family Medicine Contact information: [address] [place] [address] [phone]         Harrison Mons, [place]. Schedule an appointment as soon as possible for a visit in 1 week(s).   Specialty: Family Medicine Contact information: [address] [address] [phone]                 Hospital course 66 y.o. 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  Patient had witnessed seizure in ED that lasted 20 seconds.  She was given IV Ativan and loaded with Keppra 1 g x 1, and admitted for further care.  She was somewhat postictal on admission.  CT head showed residual right temporal lobe tumor with surrounding vasogenic edema and associated mass effect unchanged from MRI 18 days prior.   Patient has not had further seizures since admission.  The next day, she felt well.  EEG suggestive of cortical dysfunction in right hemisphere, maximal right temporal region likely secondary to underlying  structural abnormality but no seizure or definite epileptiform discharge.  Neurooncology and neurology recommended increasing Keppra to 750 mg twice daily and Decadron to 4 g daily until outpatient follow-up with neuro-oncology.  Patient was given instruction on seizure precaution.  She was advised not to drive until she is seizure-free at least for 6 months per [facility].  Patient was evaluated by therapy and no need was identified.   See individual problem list below for more on hospital course.  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  She was on 1 g daily prior to admission.  She received Keppra 1 g twice daily in house.  Discharged on p.o. Keppra 750 mg twice daily per neuro and neuro oncology recommendation.  Home Decadron increased to 4 g daily.  Pleomorphic xanthoastrocytoma (HCC) No significant change in CT compared to an MRI brain 18 days prior. -Home Decadron increased from 1 g to 4 g -Outpatient follow-up with neuro-oncology  Depression Continue home meds.    Vital signs Vitals:   07/15/21 2344 07/16/21 0344 07/16/21 0802 07/16/21 1128  BP: (!) 152/73 (!) 149/80 135/78 133/79  Pulse: 68 71 63 71  Temp: 98.6 F (37 C) 97.8 F (36.6 C) 97.9 F (36.6 C) 97.8 F (36.6 C)  Resp: '13 18 17 17  '$ Height:      Weight:      SpO2: 97% 96% 95%  96%  TempSrc:  Oral Oral Oral  BMI (Calculated):         Discharge exam  GENERAL: No apparent distress.  Nontoxic. HEENT: MMM.  Vision and hearing grossly intact.  NECK: Supple.  No apparent JVD.  RESP:  No IWOB.  Fair aeration bilaterally. CVS:  RRR. Heart sounds normal.  ABD/GI/GU: BS+. Abd soft, NTND.  MSK/EXT:  Moves  extremities. No apparent deformity. No edema.  SKIN: no apparent skin lesion or wound NEURO: Awake and alert. Oriented appropriately.  No apparent focal neuro deficit. PSYCH: Calm. Normal affect.    Discharge Instructions Discharge Instructions     Diet general   Complete by: As directed    Discharge instructions   Complete by: As directed    It has been a pleasure taking care of you!  You were hospitalized due to seizure from the brain tumor.  We have increased your Keppra and Decadron until you follow-up with your neuro-oncologist (Dr. Mickeal Skinner).  Please review your new medication list and the directions on the medication before you take them.  DO NOT drive for 6 months/until cleared by physician   Per [facility] statutes, patients with seizures are not allowed to drive until they have been seizure-free for six months. Patient acknowledged,repeated, and understands.    Use caution when using heavy equipment or power tools. Avoid working on ladders or at heights. Take showers instead of baths. Ensure the water temperature is not too high on the home water heater. Do not go swimming alone. Do not lock yourself in a room alone (i.e. bathroom). When caring for infants or small children, sit down when holding, feeding, or changing them to minimize risk of injury to the child in the event you have a seizure. Maintain good sleep hygiene. Avoid alcohol.    If patient has another seizure, call 911 and bring them back to the ED if: A.  Any seizure-like activity or alteration of mentation or LOC - especially if  > 5 mins.      B.  The patient doesn't wake shortly after the seizure or has new problems such as difficulty seeing, speaking or moving following the seizure C.  The patient was injured during the seizure D.  The patient has a temperature over 102 F (39C) E.  The patient vomited during the seizure and now is having trouble breathing   During the Seizure   - First, ensure adequate  ventilation and place patients on the floor on their left side  Loosen clothing around the neck and ensure the airway is patent. If the patient is clenching the teeth, do not force the mouth open with any object as this can cause severe damage - Remove all items from the surrounding that can be hazardous. The patient may be oblivious to what's happening and may not even know what he or she is doing. If the patient is confused and wandering, either gently guide him/her away and block access to outside areas - Reassure the individual and be comforting - Call 911. In most cases, the seizure ends before EMS arrives. However, there are cases when seizures may last over 3 to 5 minutes. Or the individual may have developed breathing difficulties or severe injuries. If a pregnant patient or a person with diabetes develops a seizure, it is prudent to call an ambulance. - Finally, if the patient does not regain full consciousness, then call EMS. Most patients will remain confused for about 45 to 90 minutes after  a seizure, so you must use judgment in calling for help. - Avoid restraints but make sure the patient is in a bed with padded side rails - Place the individual in a lateral position with the neck slightly flexed; this will help the saliva drain from the mouth and prevent the tongue from falling backward - Remove all nearby furniture and other hazards from the area - Provide verbal assurance as the individual is regaining consciousness - Provide the patient with privacy if possible - Call for help and start treatment as ordered by the caregiver    After the Seizure (Postictal Stage)   After a seizure, most patients experience confusion, fatigue, muscle pain and/or a headache. Thus, one should permit the individual to sleep. For the next few days, reassurance is essential. Being calm and helping reorient the person is also of importance.   Most seizures are painless and end spontaneously. Seizures are  not harmful to others but can lead to complications such as stress on the lungs, brain and the heart. Individuals with prior lung problems may develop labored breathing and respiratory distress.      Take care,   Increase activity slowly   Complete by: As directed       Allergies as of 07/16/2021       Reactions   Rifapentine Other (See Comments)   Flu-like symptoms   Amoxicillin Rash   Clavulanic Acid Rash        Medication List     STOP taking these medications    meloxicam 15 MG tablet Commonly known as: MOBIC       TAKE these medications    atorvastatin 20 MG tablet Commonly known as: LIPITOR Take 1 tablet (20 mg total) by mouth daily.   cholecalciferol 1000 units tablet Commonly known as: VITAMIN D Take 1,000 Units by mouth daily.   dexamethasone 4 MG tablet Commonly known as: DECADRON Take 1 tablet (4 mg total) by mouth daily with breakfast. What changed:  medication strength how much to take when to take this   fluticasone 50 MCG/ACT nasal spray Commonly known as: FLONASE Place 1-2 sprays into both nostrils daily.   levETIRAcetam 250 MG tablet Commonly known as: KEPPRA Take 3 tablets (750 mg total) by mouth 2 (two) times daily. What changed:  medication strength how much to take   multivitamin with minerals Tabs tablet Take 1 tablet by mouth at bedtime.   sertraline 100 MG tablet Commonly known as: ZOLOFT Take 1 tablet (100 mg total) by mouth daily.   triamcinolone 55 MCG/ACT Aero nasal inhaler Commonly known as: NASACORT Place 2 sprays into the nose at bedtime. In to each nostril   valACYclovir 500 MG tablet Commonly known as: VALTREX Take 1 tablet (500 mg total) by mouth 2 (two) times daily. Takes only if needed for breakouts        Consultations: Neurooncology Neurology  Procedures/Studies:    CT Head Wo Contrast  Result Date: 07/15/2021 CLINICAL DATA:  Seizure disorder. Recent seizures. History of craniotomy for  pleomorphic xanthoastrocytoma. EXAM: CT HEAD WITHOUT CONTRAST TECHNIQUE: Contiguous axial images were obtained from the base of the skull through the vertex without intravenous contrast. RADIATION DOSE REDUCTION: This exam was performed according to the departmental dose-optimization program which includes automated exposure control, adjustment of the mA and/or kV according to patient size and/or use of iterative reconstruction technique. COMPARISON:  CT head 11/25/2020.  MRI head 06/27/2021. FINDINGS: Brain: Stable postsurgical changes related to right temporal craniotomy. As  demonstrated on MRI, there is a heterogeneous mass in the right temporal lobe which measures approximately 4.5 x 3.4 cm on image 11/2, similar to previous MRI. There is surrounding vasogenic edema in the right temporal, parietal and frontal lobes is unchanged. There is right to left midline shift by 6 mm, similar to recent MRI, and improved from previous CT. No evidence of acute intracranial hemorrhage, hydrocephalus or extra-axial fluid collection. Vascular:  No hyperdense vessel identified. Skull: Stable right temporal craniotomy changes. No acute osseous findings. Sinuses/Orbits: The visualized paranasal sinuses and mastoid air cells are clear. No orbital abnormalities are seen. Other: None. IMPRESSION: 1. Compared with the MRI performed 18 days earlier, no gross change in the residual right temporal lobe tumor, surrounding vasogenic edema and associated mass effect. 2. No evidence of acute intracranial hemorrhage or hydrocephalus. Electronically Signed   By: Richardean Sale M.D.   On: 07/15/2021 15:07   MR BRAIN W WO CONTRAST  Result Date: 06/29/2021 CLINICAL DATA:  Pleomorphic exam thoracic or cytoma. EXAM: MRI HEAD WITHOUT AND WITH CONTRAST TECHNIQUE: Multiplanar, multiecho pulse sequences of the brain and surrounding structures were obtained without and with intravenous contrast. CONTRAST:  71m MULTIHANCE GADOBENATE DIMEGLUMINE 529  MG/ML IV SOLN COMPARISON:  05/30/2021 FINDINGS: Brain: Heterogeneously enhancing mass centered in the right temporal lobe with irregular primarily peripheral enhancement that is unchanged from before. Dimensions measure up to 4.2 x 4.6 x 3.6 cm, unchanged when remeasured in a similar fashion. Perfusion imaging was obtained and shows no differentiating feature between radiation necrosis and tumor. The degree of adjacent T2 hyperintensity with swelling in the right cerebral hemisphere and deep white matter tracks is also unchanged with midline shift measuring up to 5 mm. No complicating infarct, hydrocephalus, or collection. Vascular: Major flow voids and vascular enhancements are preserved. Skull and upper cervical spine: Unremarkable right-sided craniotomy Sinuses/Orbits: Negative IMPRESSION: Unchanged heterogeneous right temporal lobe mass with extensive vasogenic edema. Electronically Signed   By: JJorje GuildM.D.   On: 06/29/2021 11:40   EEG adult  Result Date: 07/16/2021 YLora Havens MD     07/16/2021  1:46 PM Patient Name: Lindsey PardiMRN: [id number] Attending: PLora HavensReferring Physician/Provider: LKerney Elbe MD Date: 07/16/2021 Duration: 21.02 mins Patient history: 89yoF brought into the ER from outpatient imaging for evaluation of multiple witnessed seizures despite being compliant with her antiepileptic medications. EEG to evaluate for seizure Level of alertness: Awake, asleep AEDs during EEG study: LEV Technical aspects: This EEG study was done with scalp electrodes positioned according to the 10-20 International system of electrode placement. Electrical activity was acquired at a sampling rate of '500Hz'$  and reviewed with a high frequency filter of '70Hz'$  and a low frequency filter of '1Hz'$ . EEG data were recorded continuously and digitally stored. Description: The posterior dominant rhythm consists of 9 Hz activity of moderate voltage (25-35 uV) seen predominantly in  posterior head regions, asymmetric ( right<left) and reactive to eye opening and eye closing. Sleep was characterized by vertex waves, sleep spindles (12 to 14 Hz), maximal frontocentral region.  EEG showed continuous sharply contoured 3 to 5 Hz theta-delta slowing in right hemisphere, maximal right temporal region.  Hyperventilation and photic stimulation were not performed.   ABNORMALITY - Continuous slow, right hemisphere, maximal right temporal region - Background asymmetry, right< left IMPRESSION: This study is suggestive of cortical dysfunction in right hemisphere, maximal right temporal region likely secondary to underlying structural abnormality.  No seizures or definite epileptiform discharges  were seen throughout the recording.  Lora Havens       The results of significant diagnostics from this hospitalization (including imaging, microbiology, ancillary and laboratory) are listed below for reference.     Microbiology: No results found for this or any previous visit (from the past 240 hour(s)).   Labs:  CBC: Recent Labs  Lab 07/15/21 1412 07/16/21 0517  WBC 13.1* 9.3  NEUTROABS 9.0*  --   HGB 14.8 13.5  HCT 45.4 39.9  MCV 93.6 88.9  PLT 274 186   BMP &GFR Recent Labs  Lab 07/15/21 1412 07/16/21 0517  NA 141 133*  K 3.7 4.4  CL 104 100  CO2 20* 25  GLUCOSE 126* 134*  BUN 16 14  CREATININE 0.68 0.44  CALCIUM 9.6 9.1   Estimated Creatinine Clearance: 62.5 mL/min (by C-G formula based on SCr of 0.44 mg/dL). Liver & Pancreas: No results for input(s): AST, ALT, ALKPHOS, BILITOT, PROT, ALBUMIN in the last 168 hours. No results for input(s): LIPASE, AMYLASE in the last 168 hours. No results for input(s): AMMONIA in the last 168 hours. Diabetic: No results for input(s): HGBA1C in the last 72 hours. Recent Labs  Lab 07/15/21 1409  GLUCAP 141*   Cardiac Enzymes: No results for input(s): CKTOTAL, CKMB, CKMBINDEX, TROPONINI in the last 168 hours. No results for  input(s): PROBNP in the last 8760 hours. Coagulation Profile: No results for input(s): INR, PROTIME in the last 168 hours. Thyroid Function Tests: No results for input(s): TSH, T4TOTAL, FREET4, T3FREE, THYROIDAB in the last 72 hours. Lipid Profile: No results for input(s): CHOL, HDL, LDLCALC, TRIG, CHOLHDL, LDLDIRECT in the last 72 hours. Anemia Panel: No results for input(s): VITAMINB12, FOLATE, FERRITIN, TIBC, IRON, RETICCTPCT in the last 72 hours. Urine analysis:    Component Value Date/Time   COLORURINE YELLOW (A) 11/25/2020 1118   APPEARANCEUR CLEAR (A) 11/25/2020 1118   LABSPEC 1.021 11/25/2020 1118   PHURINE 6.0 11/25/2020 1118   GLUCOSEU NEGATIVE 11/25/2020 1118   [place] 11/25/2020 1118   [place] 11/25/2020 1118   KETONESUR 5 (A) 11/25/2020 1118   PROTEINUR NEGATIVE 11/25/2020 1118   NITRITE NEGATIVE 11/25/2020 1118   LEUKOCYTESUR NEGATIVE 11/25/2020 1118   Sepsis Labs: Invalid input(s): PROCALCITONIN, LACTICIDVEN   Time coordinating discharge: 45 minutes  SIGNED:  Mercy Riding, MD  [facility] 07/16/2021, 11:34 PM

## 2021-07-16 NOTE — Progress Notes (Signed)
OT Cancellation Note  Patient Details Name: Lindsey Daniels MRN: [id number] DOB: Oct 04, 1955   Cancelled Treatment:    Reason Eval/Treat Not Completed: OT screened, no needs identified, will sign off. Order received, chart reviewed. Per conversation with PT, back to baseline functional independence. No skilled OT needs identified. Will sign off. Please re-consult if additional needs arise.    [employer], OTDS  [employer] 07/16/2021, 10:23 AM

## 2021-07-16 NOTE — Hospital Course (Signed)
66 y.o. 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  Patient had witnessed seizure in ED that lasted 20 seconds.  She was given IV Ativan and loaded with Keppra 1 g x 1, and admitted for further care.  She was somewhat postictal on admission.  CT head showed residual right temporal lobe tumor with surrounding vasogenic edema and associated mass effect unchanged from MRI 18 days prior.   Patient has not had further seizures since admission.  The next day, she felt well.  EEG suggestive of cortical dysfunction in right hemisphere, maximal right temporal region likely secondary to underlying structural abnormality but no seizure or definite epileptiform discharge.  Neurooncology and neurology recommended increasing Keppra to 750 mg twice daily and Decadron to 4 g daily until outpatient follow-up with neuro-oncology.  Patient was given instruction on seizure precaution.  She was advised not to drive until she is seizure-free at least for 6 months per [facility].  Patient was evaluated by therapy and no need was identified.

## 2021-07-16 NOTE — Evaluation (Signed)
Physical Therapy Evaluation Patient Details Name: Lindsey Daniels MRN: [id number] DOB: 11-22-1955 Today's Date: 07/16/2021  History of Present Illness  Pt is a 66 y.o. 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  She had another witnessed seizure in the ER.   Clinical Impression  Patient alert, agreeable to PT, oriented x4 and denied pain. The patient reported at baseline she lives independently in a one story townhome, has friends/neighbors to check in as needed, still works/drives.   The patient demonstrated bed mobility and transfers modI. She was able to ambulate ~115f with and without IV pole, supervision. The patient did demonstrate impaired gait mechanics, such as decreased step length/height, cadence, and gait velocity. Pt also endorsed improved comfort with at least unilateral UE support. Pt agreeable to use her walking stick (intermittently uses it for community ambulation/uneven surfances) at discharge. Overall the patient would benefit from outpatient PT intervention to maximize balance, decreases risk of falls, and improve safety with community ambulation (pt with history of falls "near topples" per her report).       Recommendations for follow up therapy are one component of a multi-disciplinary discharge planning process, led by the attending physician.  Recommendations may be updated based on patient status, additional functional criteria and insurance authorization.  Follow Up Recommendations Outpatient PT    Assistance Recommended at Discharge PRN  Patient can return home with the following        Equipment Recommendations None recommended by PT  Recommendations for Other Services       Functional Status Assessment  (balance deficits and history of falling at baseline)     Precautions / Restrictions Precautions Precautions: Fall Restrictions Weight Bearing Restrictions: No      Mobility  Bed Mobility Overal bed mobility: Modified Independent                  Transfers Overall transfer level: Modified independent                      Ambulation/Gait   Gait Distance (Feet): 110 Feet Assistive device: None, IV Pole         General Gait Details: decreased cadence, decreased step length/height with and without IV pole, pt voiced improvement in confidence with use of at least unilateral support  Stairs            Wheelchair Mobility    Modified Rankin (Stroke Patients Only)       Balance Overall balance assessment: Needs assistance Sitting-balance support: Feet supported Sitting balance-Leahy Scale: Normal       Standing balance-Leahy Scale: Good                               Pertinent Vitals/Pain Pain Assessment Pain Assessment: No/denies pain    Home Living Family/patient expects to be discharged to:: Private residence Living Arrangements: Alone Available Help at Discharge: Family;Friend(s);Neighbor Type of Home: House Home Access: Other (comment) (threshold entry)       Home Layout: One level Home Equipment: Cane - single point      Prior Function Prior Level of Function : Independent/Modified Independent;Driving;Working/employed  Hand Dominance   Dominant Hand: Right    Extremity/Trunk Assessment   Upper Extremity Assessment Upper Extremity Assessment: Overall WFL for tasks assessed    Lower Extremity Assessment Lower Extremity Assessment: Overall WFL for tasks assessed    Cervical / Trunk Assessment Cervical / Trunk Assessment: Normal  Communication    Communication: No difficulties  Cognition Arousal/Alertness: Awake/alert Behavior During Therapy: WFL for tasks assessed/performed Overall Cognitive Status: Within Functional Limits for tasks assessed                                          General Comments      Exercises     Assessment/Plan    PT Assessment Patient needs continued PT services  PT Problem List Decreased mobility;Decreased activity tolerance;Decreased balance       PT Treatment Interventions DME instruction;Therapeutic exercise;Gait training;Balance training;Stair training;Neuromuscular re-education;Functional mobility training;Therapeutic activities;Patient/family education    PT Goals (Current goals can be found in the Care Plan section)  Acute Rehab PT Goals Patient Stated Goal: to go home PT Goal Formulation: With patient Time For Goal Achievement: 07/30/21 Potential to Achieve Goals: Good    Frequency Min 2X/week     Co-evaluation               AM-PAC PT "6 Clicks" Mobility  Outcome Measure Help needed turning from your back to your side while in a flat bed without using bedrails?: None Help needed moving from lying on your back to sitting on the side of a flat bed without using bedrails?: None Help needed moving to and from a bed to a chair (including a wheelchair)?: None Help needed standing up from a chair using your arms (e.g., wheelchair or bedside chair)?: None Help needed to walk in hospital room?: None Help needed climbing 3-5 steps with a railing? : None 6 Click Score: 24    End of Session   Activity Tolerance: Patient tolerated treatment well Patient left: in bed;with call bell/phone within reach;with bed alarm set Nurse Communication: Mobility status PT Visit Diagnosis: Other abnormalities of gait and mobility (R26.89);History of falling (Z91.81)    Time: 9476-5465 PT Time Calculation (min) (ACUTE ONLY): 20 min   Charges:   PT Evaluation $PT Eval Low  Complexity: 1 Low PT Treatments $Therapeutic Activity: 8-22 mins        Lieutenant Diego PT, DPT 12:45 PM,07/16/21

## 2021-07-17 ENCOUNTER — Telehealth: Payer: Self-pay | Admitting: *Deleted

## 2021-07-17 NOTE — Telephone Encounter (Signed)
Patient called to report that she had approximately 5 seizures yesterday while at work.  She was unaware of the seizure during the episodes.  She had facial drooping and full body seizure.   EMS took her to the hospital and CT was done.  They performed an EEG.    She describes today feeling well besides having sensation difference/ coolness to the left arm from shoulder down to the fingertip of her pinky finger.  Almost like it is asleep.  No issues with any other extremities.    She wanted to make sure what the cause of that could be.  Knows it was a seizure but since she had facial drooping she is concerned and wanted to run this by the doctor.    ER increased dose of Keppra to 750 BID and Decadron 4 once daily.  Due to seizure she wants to know if she has clearance to work or should there be a delay to start back or work restrictions.  She is aware that she cannot drive due to the seizure.   Her drive time to work is [place] to [place] and she is aware she will need to coordinate something for that.    Patient doesn't follow up with Dr Mickeal Skinner until 09/01/2021 to review MRI.  Should this remain as scheduled.

## 2021-07-21 ENCOUNTER — Encounter: Payer: Self-pay | Admitting: Internal Medicine

## 2021-07-22 ENCOUNTER — Other Ambulatory Visit: Payer: Self-pay

## 2021-07-22 ENCOUNTER — Inpatient Hospital Stay: Payer: BC Managed Care – PPO | Attending: Radiation Oncology | Admitting: Internal Medicine

## 2021-07-22 VITALS — BP 153/75 | HR 91 | Temp 97.2°F | Resp 18 | Ht 61.0 in | Wt 164.5 lb

## 2021-07-22 DIAGNOSIS — R569 Unspecified convulsions: Secondary | ICD-10-CM | POA: Diagnosis not present

## 2021-07-22 DIAGNOSIS — C719 Malignant neoplasm of brain, unspecified: Secondary | ICD-10-CM | POA: Diagnosis not present

## 2021-07-22 DIAGNOSIS — Z79899 Other long term (current) drug therapy: Secondary | ICD-10-CM | POA: Diagnosis not present

## 2021-07-22 DIAGNOSIS — C712 Malignant neoplasm of temporal lobe: Secondary | ICD-10-CM | POA: Diagnosis not present

## 2021-07-22 MED ORDER — DEXAMETHASONE 1 MG PO TABS: ORAL_TABLET | ORAL | 0 refills | Status: AC

## 2021-07-22 MED ORDER — LORAZEPAM 1 MG PO TABS: 1.0000 mg | ORAL_TABLET | Freq: Three times a day (TID) | ORAL | 0 refills | Status: DC

## 2021-07-22 NOTE — Progress Notes (Signed)
[facility] [place], [place] [address] [phone]   [facility] Evaluation  Date of Service: 07/22/21 Patient Name: Aliayah Tyer Patient MRN: [id number] Patient DOB: 1955-05-23 Provider: Ventura Sellers, MD  Identifying Statement:  Nneka Blanda is a 66 y.o. female with right temporal  pleomorphic xanthoastrocytoma    Oncologic History: Oncology History  Pleomorphic xanthoastrocytoma ([facility])  09/29/2013 Surgery   Right temporal craniotomy, resection with Dr. Sherwood Gambler.  Path is PXA WHO II.   11/29/2020 Surgery   Disease progression, second craniotomy with Dr. Christella Noa, sub-total resection.  Path is unchanged.   02/21/2021 - 04/02/2021 Radiation Therapy   IMRT radiation therapy with Dr. Lisbeth Renshaw     Biomarkers:  MGMT Methylated.  IDH 1/2 Wild type.  BRAF V600e mutation detected  TERT Unmutated   Interval History: [name] presents for follow up after recent admission for breakthrough seizures.  She described 4 sequential episodes of arm posturing, impaired awareness, followed by confusion.  She was treated with IV ativan, Keppra, steroids in the ED and admitted.  Post seizure confusion lasted for "a day or two".  At this time feels at her baseline, on 34m daily decadron.  Remains otherwise active and independent aside from not being able to drive.  She has not returned to work.  Fatigue is stable from prior.  H+P (01/30/21) Patient presents today after recent re-resection for progressive right temporal PXA.  She initially presented with a seizure in 2015; imaging demonstrated enhancing right temporal lesion which was grossly resected by Dr. NSherwood Gambler  After last follow up in 2020, she began to experience headaches, ear fullness and some confusion this fall.  Imaging demonstrated marked progression of disease; she went for second surgery on 11/29/20.  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   Medications: Current Outpatient Medications on File Prior to Visit  Medication Sig Dispense Refill   atorvastatin (LIPITOR) 20 MG tablet Take 1 tablet (20 mg total) by mouth daily. 90 tablet 3   cholecalciferol (VITAMIN D) 1000 units tablet Take 1,000 Units by mouth daily.     dexamethasone (DECADRON) 4 MG tablet Take 1 tablet (4 mg total) by mouth daily with breakfast. 30 tablet 0   fluticasone (FLONASE) 50 MCG/ACT nasal spray Place 1-2 sprays into both nostrils daily. 16 g 0   levETIRAcetam (KEPPRA) 250 MG tablet Take 3 tablets (750 mg total) by mouth 2 (two) times daily. 180 tablet 2   Multiple Vitamin (MULTIVITAMIN WITH MINERALS) TABS tablet Take 1 tablet by mouth at bedtime.     sertraline (ZOLOFT) 100 MG tablet Take 1 tablet (100 mg total) by mouth daily. 90 tablet 3   triamcinolone (NASACORT) 55 MCG/ACT AERO nasal inhaler Place 2 sprays into the nose at bedtime. In to each nostril 1 Inhaler 12   valACYclovir (VALTREX) 500 MG tablet Take 1 tablet (500 mg total) by mouth 2 (two) times daily. Takes only if needed for breakouts (Patient not taking: Reported on 04/01/2021) 30 tablet prn   No current facility-administered medications on file prior to visit.    Allergies:  Allergies  Allergen Reactions   Rifapentine Other (See Comments)    Flu-like symptoms   Amoxicillin Rash   Clavulanic Acid Rash   Past Medical History:  Past Medical History:  Diagnosis Date   Allergy    Anal fissure  Anxiety    Cataract    Depression    Elevated LFTs    Hyperglycemia    Hyperlipidemia    Hypertension    Osteoarthritis    Pars defect of lumbar spine    Seizures (HCC)    Spondylolisthesis    lumbar   Past Surgical History:  Past Surgical History:  Procedure Laterality Date   cataract     CRANIOTOMY N/A 10/02/2013   Procedure: 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;  Surgeon: Hosie Spangle, MD;  Location: [facility];   Service: Neurosurgery;  Laterality: N/A;  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   CRANIOTOMY Right 11/29/2020   Procedure: Right Parietal craniotomy for tumor resection;  Surgeon: Ashok Pall, MD;  Location: [facility];  Service: Neurosurgery;  Laterality: Right;   HEEL SPUR SURGERY     Social History:  Social History   Socioeconomic History   Marital status: Divorced    Spouse name: separated-no papers file   Number of children: 0   Years of education: Not on file   Highest education level: Not on file  Occupational History   Occupation: RT    Employer: [employer] [place]: [employer] and [employer]  Tobacco Use   Smoking status: Never   Smokeless tobacco: Never  Substance and Sexual Activity   Alcohol use: Yes    Alcohol/week: 0.0 - 4.0 standard drinks    Comment: social   Drug use: No   Sexual activity: Not Currently    Partners: Male  Other Topics Concern   Not on file  Social History Narrative   Separated from second husband.  Very contentious split.   Social Determinants of Health   Financial Resource Strain: Not on file  Food Insecurity: Not on file  Transportation Needs: Not on file  Physical Activity: Not on file  Stress: Not on file  Social Connections: Not on file  Intimate Partner Violence: Not At Risk   Fear of Current or Ex-Partner: No   Emotionally Abused: No   Physically Abused: No   Sexually Abused: No   Family History:  Family History  Problem Relation Age of Onset   Mental illness Father        depression   COPD Father    Alcohol abuse Brother    Breast cancer Neg Hx     Review of Systems: Constitutional: Doesn't report fevers, chills or abnormal weight loss Eyes: Doesn't report blurriness of vision Ears, nose, mouth, throat, and face: Doesn't report sore throat Respiratory: Doesn't report cough, dyspnea or wheezes Cardiovascular: Doesn't report palpitation, chest discomfort   Gastrointestinal:  Doesn't report nausea, constipation, diarrhea GU: Doesn't report incontinence Skin: Doesn't report skin rashes Neurological: Per HPI Musculoskeletal: Doesn't report joint pain Behavioral/Psych: Doesn't report anxiety  Physical Exam: Vitals:   07/22/21 1416  BP: (!) 153/75  Pulse: 91  Resp: 18  Temp: (!) 97.2 F (36.2 C)  SpO2: 96%    KPS: 90. General: Alert, cooperative, pleasant, in no acute distress Head: Normal EENT: No conjunctival injection or scleral icterus.  Lungs: Resp effort normal Cardiac: Regular rate Abdomen: Non-distended abdomen Skin: No rashes cyanosis or petechiae. Extremities: No clubbing or edema  Neurologic Exam: Mental Status: Awake, alert, attentive to examiner. Oriented to self and environment. Language is fluent with intact comprehension.  Cranial Nerves: Visual acuity is grossly normal. Visual fields are full. Extra-ocular movements intact. No ptosis. Face is symmetric Motor: Tone  and bulk are normal. Power is full in both arms and legs. Reflexes are symmetric, no pathologic reflexes present.  Sensory: Intact to light touch Gait: Normal.   Labs: I have reviewed the data as listed    Component Value Date/Time   NA 133 (L) 07/16/2021 0517   NA 142 06/30/2017 1815   K 4.4 07/16/2021 0517   CL 100 07/16/2021 0517   CO2 25 07/16/2021 0517   GLUCOSE 134 (H) 07/16/2021 0517   BUN 14 07/16/2021 0517   BUN 14 06/30/2017 1815   CREATININE 0.44 07/16/2021 0517   CREATININE 0.62 02/06/2021 1121   CREATININE 0.72 06/07/2015 0825   CALCIUM 9.1 07/16/2021 0517   PROT 7.0 06/30/2017 1815   ALBUMIN 4.6 06/30/2017 1815   AST 22 06/30/2017 1815   ALT 17 06/30/2017 1815   ALKPHOS 89 06/30/2017 1815   BILITOT 0.6 06/30/2017 1815   GFRNONAA >60 07/16/2021 0517   GFRNONAA >60 02/06/2021 1121   GFRAA 108 06/30/2017 1815   Lab Results  Component Value Date   WBC 9.3 07/16/2021   NEUTROABS 9.0 (H) 07/15/2021   HGB 13.5 07/16/2021    HCT 39.9 07/16/2021   MCV 88.9 07/16/2021   PLT 186 07/16/2021   Imaging:  CT Head Wo Contrast  Result Date: 07/15/2021 CLINICAL DATA:  Seizure disorder. Recent seizures. History of craniotomy for pleomorphic xanthoastrocytoma. EXAM: CT HEAD WITHOUT CONTRAST TECHNIQUE: Contiguous axial images were obtained from the base of the skull through the vertex without intravenous contrast. RADIATION DOSE REDUCTION: This exam was performed according to the departmental dose-optimization program which includes automated exposure control, adjustment of the mA and/or kV according to patient size and/or use of iterative reconstruction technique. COMPARISON:  CT head 11/25/2020.  MRI head 06/27/2021. FINDINGS: Brain: Stable postsurgical changes related to right temporal craniotomy. As demonstrated on MRI, there is a heterogeneous mass in the right temporal lobe which measures approximately 4.5 x 3.4 cm on image 11/2, similar to previous MRI. There is surrounding vasogenic edema in the right temporal, parietal and frontal lobes is unchanged. There is right to left midline shift by 6 mm, similar to recent MRI, and improved from previous CT. No evidence of acute intracranial hemorrhage, hydrocephalus or extra-axial fluid collection. Vascular:  No hyperdense vessel identified. Skull: Stable right temporal craniotomy changes. No acute osseous findings. Sinuses/Orbits: The visualized paranasal sinuses and mastoid air cells are clear. No orbital abnormalities are seen. Other: None. IMPRESSION: 1. Compared with the MRI performed 18 days earlier, no gross change in the residual right temporal lobe tumor, surrounding vasogenic edema and associated mass effect. 2. No evidence of acute intracranial hemorrhage or hydrocephalus. Electronically Signed   By: Richardean Sale M.D.   On: 07/15/2021 15:07   MR BRAIN W WO CONTRAST  Result Date: 06/29/2021 CLINICAL DATA:  Pleomorphic exam thoracic or cytoma. EXAM: MRI HEAD WITHOUT AND  WITH CONTRAST TECHNIQUE: Multiplanar, multiecho pulse sequences of the brain and surrounding structures were obtained without and with intravenous contrast. CONTRAST:  6m MULTIHANCE GADOBENATE DIMEGLUMINE 529 MG/ML IV SOLN COMPARISON:  05/30/2021 FINDINGS: Brain: Heterogeneously enhancing mass centered in the right temporal lobe with irregular primarily peripheral enhancement that is unchanged from before. Dimensions measure up to 4.2 x 4.6 x 3.6 cm, unchanged when remeasured in a similar fashion. Perfusion imaging was obtained and shows no differentiating feature between radiation necrosis and tumor. The degree of adjacent T2 hyperintensity with swelling in the right cerebral hemisphere and deep white matter tracks is also unchanged  with midline shift measuring up to 5 mm. No complicating infarct, hydrocephalus, or collection. Vascular: Major flow voids and vascular enhancements are preserved. Skull and upper cervical spine: Unremarkable right-sided craniotomy Sinuses/Orbits: Negative IMPRESSION: Unchanged heterogeneous right temporal lobe mass with extensive vasogenic edema. Electronically Signed   By: Jorje Guild M.D.   On: 06/29/2021 11:40   EEG adult  Result Date: 07/16/2021 Lora Havens, MD     07/16/2021  1:46 PM Patient Name: Ercie Eliasen MRN: [id number] Epilepsy Attending: Lora Havens Referring Physician/Provider: Kerney Elbe, MD Date: 07/16/2021 Duration: 21.02 mins Patient history: 66yo F brought into the ER from outpatient imaging for evaluation of multiple witnessed seizures despite being compliant with her antiepileptic medications. EEG to evaluate for seizure Level of alertness: Awake, asleep AEDs during EEG study: LEV Technical aspects: This EEG study was done with scalp electrodes positioned according to the 10-20 International system of electrode placement. Electrical activity was acquired at a sampling rate of '500Hz'  and reviewed with a high frequency filter of '70Hz'  and a low  frequency filter of '1Hz' . EEG data were recorded continuously and digitally stored. Description: The posterior dominant rhythm consists of 9 Hz activity of moderate voltage (25-35 uV) seen predominantly in posterior head regions, asymmetric ( right<left) and reactive to eye opening and eye closing. Sleep was characterized by vertex waves, sleep spindles (12 to 14 Hz), maximal frontocentral region.  EEG showed continuous sharply contoured 3 to 5 Hz theta-delta slowing in right hemisphere, maximal right temporal region.  Hyperventilation and photic stimulation were not performed.   ABNORMALITY - Continuous slow, right hemisphere, maximal right temporal region - Background asymmetry, right< left IMPRESSION: This study is suggestive of cortical dysfunction in right hemisphere, maximal right temporal region likely secondary to underlying structural abnormality.  No seizures or definite epileptiform discharges were seen throughout the recording.  Priyanka Barbra Sarks     Assessment/Plan Pleomorphic xanthoastrocytoma [facility])  Jeannia Tatro is clinically stable today, now s/p breakthrough seizure cluster.  CT head re-demonstrated known inflammatory process within much of right hemisphere.    We recommended decreasing decadron to 26m daily x1 week, then 255mdaily x1 week, then 68m34maily thereafter, if tolerated.  Keppra has been increased to 750m368mD and should stay at that dose for now.  She understands she needs to be seizure free for 6 months before driving again.  We ask that MaryKarle Starcher return to clinic in 1 months following next brain MRI, or sooner as needed.  All questions were answered. The patient knows to call the clinic with any problems, questions or concerns. No barriers to learning were detected.  The total time spent in the encounter was 30 minutes and more than 50% was on counseling and review of test results   ZachVentura Sellers Medical Director of Neuro-Oncology [facility]06/23 2:16 PM

## 2021-07-23 ENCOUNTER — Other Ambulatory Visit: Payer: Self-pay | Admitting: Radiation Therapy

## 2021-07-30 ENCOUNTER — Other Ambulatory Visit: Payer: BC Managed Care – PPO

## 2021-08-12 ENCOUNTER — Telehealth: Payer: Self-pay

## 2021-08-29 ENCOUNTER — Ambulatory Visit (HOSPITAL_COMMUNITY)
Admission: RE | Admit: 2021-08-29 | Discharge: 2021-08-29 | Disposition: A | Discharge status: Home / Self Care | Payer: BC Managed Care – PPO | Source: Ambulatory Visit | Attending: Internal Medicine | Admitting: Internal Medicine

## 2021-08-29 DIAGNOSIS — C719 Malignant neoplasm of brain, unspecified: Secondary | ICD-10-CM | POA: Diagnosis not present

## 2021-08-29 MED ORDER — GADOBUTROL 1 MMOL/ML IV SOLN
7.0000 mL | Freq: Once | INTRAVENOUS | Status: AC | PRN
  Administered 2021-08-29: 7 mL via INTRAVENOUS

## 2021-09-01 ENCOUNTER — Inpatient Hospital Stay: Payer: BC Managed Care – PPO | Attending: Radiation Oncology | Admitting: Internal Medicine

## 2021-09-01 ENCOUNTER — Other Ambulatory Visit: Payer: Self-pay

## 2021-09-01 ENCOUNTER — Inpatient Hospital Stay: Payer: BC Managed Care – PPO

## 2021-09-01 VITALS — BP 155/83 | HR 79 | Temp 97.4°F | Resp 16 | Wt 179.3 lb

## 2021-09-01 DIAGNOSIS — C719 Malignant neoplasm of brain, unspecified: Secondary | ICD-10-CM | POA: Diagnosis not present

## 2021-09-01 DIAGNOSIS — R569 Unspecified convulsions: Secondary | ICD-10-CM | POA: Diagnosis not present

## 2021-09-01 DIAGNOSIS — C712 Malignant neoplasm of temporal lobe: Secondary | ICD-10-CM | POA: Insufficient documentation

## 2021-09-01 NOTE — Progress Notes (Signed)
[facility] [place], [place] [address] [phone]   Salley Blas Evaluation  Date of Service: 09/01/21 Patient Name: Lindsey Daniels Patient MRN: [id number] Patient DOB: Apr 17, 1955 Provider: Ventura Sellers, MD  Identifying Statement:  Nazia Rhines is a 66 y.o. female with right temporal  pleomorphic xanthoastrocytoma    Oncologic History: Oncology History  Pleomorphic xanthoastrocytoma ([facility])  09/29/2013 Surgery   Right temporal craniotomy, resection with Dr. Sherwood Gambler.  Path is PXA WHO II.   11/29/2020 Surgery   Disease progression, second craniotomy with Dr. Christella Noa, sub-total resection.  Path is unchanged.   02/21/2021 - 04/02/2021 Radiation Therapy   IMRT radiation therapy with Dr. Lisbeth Renshaw     Biomarkers:  MGMT Methylated.  IDH 1/2 Wild type.  BRAF V600e mutation detected  TERT Unmutated   Interval History: Lindsey Daniels presents for follow up after recent MRI brain.  No further seizures since last visit.  She has gained weight, and face is "puffy".  Remains otherwise active and independent aside from not being able to drive.  She has not yet returned to work.  Decadron currently at 32m daily.  H+P (01/30/21) Patient presents today after recent re-resection for progressive right temporal PXA.  She initially presented with a seizure in 2015; imaging demonstrated enhancing right temporal lesion which was grossly resected by Dr. NSherwood Gambler  After last follow up in 2020, she began to experience headaches, ear fullness and some confusion this fall.  Imaging demonstrated marked progression of disease; she went for second surgery on 11/29/20.  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   Medications: Current Outpatient Medications on File Prior to Visit  Medication Sig Dispense Refill   atorvastatin (LIPITOR) 20 MG tablet Take 1 tablet (20 mg total)  by mouth daily. 90 tablet 3   cholecalciferol (VITAMIN D) 1000 units tablet Take 1,000 Units by mouth daily.     dexamethasone (DECADRON) 1 MG tablet Take 3 tablets (3 mg total) by mouth daily with breakfast for 7 days, THEN 2 tablets (2 mg total) daily with breakfast for 7 days, THEN 1 tablet (1 mg total) daily with breakfast. 65 tablet 0   fluticasone (FLONASE) 50 MCG/ACT nasal spray Place 1-2 sprays into both nostrils daily. 16 g 0   levETIRAcetam (KEPPRA) 250 MG tablet Take 3 tablets (750 mg total) by mouth 2 (two) times daily. 180 tablet 2   LORazepam (ATIVAN) 1 MG tablet Take 1 tablet (1 mg total) by mouth every 8 (eight) hours. 30 tablet 0   Multiple Vitamin (MULTIVITAMIN WITH MINERALS) TABS tablet Take 1 tablet by mouth at bedtime.     sertraline (ZOLOFT) 100 MG tablet Take 1 tablet (100 mg total) by mouth daily. 90 tablet 3   triamcinolone (NASACORT) 55 MCG/ACT AERO nasal inhaler Place 2 sprays into the nose at bedtime. In to each nostril 1 Inhaler 12   valACYclovir (VALTREX) 500 MG tablet Take 1 tablet (500 mg total) by mouth 2 (two) times daily. Takes only if needed for breakouts (Patient not taking: Reported on 04/01/2021) 30 tablet prn   No current facility-administered medications on file prior to visit.    Allergies:  Allergies  Allergen Reactions   Rifapentine Other (See Comments)    Flu-like symptoms   Amoxicillin Rash   Clavulanic Acid Rash   Past Medical History:  Past Medical History:  Diagnosis Date  Allergy    Anal fissure    Anxiety    Cataract    Depression    Elevated LFTs    Hyperglycemia    Hyperlipidemia    Hypertension    Osteoarthritis    Pars defect of lumbar spine    Seizures (HCC)    Spondylolisthesis    lumbar   Past Surgical History:  Past Surgical History:  Procedure Laterality Date   cataract     CRANIOTOMY N/A 10/02/2013   Procedure: 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;  Surgeon: Hosie Spangle, MD;  Location: [facility];  Service: Neurosurgery;  Laterality: N/A;  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   CRANIOTOMY Right 11/29/2020   Procedure: Right Parietal craniotomy for tumor resection;  Surgeon: Ashok Pall, MD;  Location: [facility];  Service: Neurosurgery;  Laterality: Right;   HEEL SPUR SURGERY     Social History:  Social History   Socioeconomic History   Marital status: Divorced    Spouse name: separated-no papers file   Number of children: 0   Years of education: Not on file   Highest education level: Not on file  Occupational History   Occupation: RT    Employer: [employer]: [employer] and [employer]  Tobacco Use   Smoking status: Never   Smokeless tobacco: Never  Substance and Sexual Activity   Alcohol use: Yes    Alcohol/week: 0.0 - 4.0 standard drinks of alcohol    Comment: social   Drug use: No   Sexual activity: Not Currently    Partners: Male  Other Topics Concern   Not on file  Social History Narrative   Separated from second husband.  Very contentious split.   Social Determinants of Health   Financial Resource Strain: Not on file  Food Insecurity: Not on file  Transportation Needs: Not on file  Physical Activity: Not on file  Stress: Not on file  Social Connections: Not on file  Intimate Partner Violence: Not At Risk (02/06/2021)   Humiliation, Afraid, Rape, and Kick questionnaire    Fear of Current or Ex-Partner: No    Emotionally Abused: No    Physically Abused: No    Sexually Abused: No   Family History:  Family History  Problem Relation Age of Onset   Mental illness Father        depression   COPD Father    Alcohol abuse Brother    Breast cancer Neg Hx     Review of Systems: Constitutional: Doesn't report fevers, chills or abnormal weight loss Eyes: Doesn't report blurriness of vision Ears, nose, mouth, throat, and face: Doesn't report sore throat Respiratory: Doesn't report cough, dyspnea  or wheezes Cardiovascular: Doesn't report palpitation, chest discomfort  Gastrointestinal:  Doesn't report nausea, constipation, diarrhea GU: Doesn't report incontinence Skin: Doesn't report skin rashes Neurological: Per HPI Musculoskeletal: Doesn't report joint pain Behavioral/Psych: Doesn't report anxiety  Physical Exam: Vitals:   09/01/21 1150  BP: (!) 155/83  Pulse: 79  Resp: 16  Temp: (!) 97.4 F (36.3 C)  SpO2: 92%    KPS: 90. General: Alert, cooperative, pleasant, in no acute distress Head: Normal EENT: No conjunctival injection or scleral icterus.  Lungs: Resp effort normal Cardiac: Regular rate Abdomen: Non-distended abdomen Skin: No rashes cyanosis or petechiae. Extremities: No clubbing or edema  Neurologic Exam: Mental Status: Awake, alert, attentive to examiner. Oriented to self and environment. Language is fluent with  intact comprehension.  Cranial Nerves: Visual acuity is grossly normal. Visual fields are full. Extra-ocular movements intact. No ptosis. Face is symmetric Motor: Tone and bulk are normal. Power is full in both arms and legs. Reflexes are symmetric, no pathologic reflexes present.  Sensory: Intact to light touch Gait: Normal.   Labs: I have reviewed the data as listed    Component Value Date/Time   NA 133 (L) 07/16/2021 0517   NA 142 06/30/2017 1815   K 4.4 07/16/2021 0517   CL 100 07/16/2021 0517   CO2 25 07/16/2021 0517   GLUCOSE 134 (H) 07/16/2021 0517   BUN 14 07/16/2021 0517   BUN 14 06/30/2017 1815   CREATININE 0.44 07/16/2021 0517   CREATININE 0.62 02/06/2021 1121   CREATININE 0.72 06/07/2015 0825   CALCIUM 9.1 07/16/2021 0517   PROT 7.0 06/30/2017 1815   ALBUMIN 4.6 06/30/2017 1815   AST 22 06/30/2017 1815   ALT 17 06/30/2017 1815   ALKPHOS 89 06/30/2017 1815   BILITOT 0.6 06/30/2017 1815   GFRNONAA >60 07/16/2021 0517   GFRNONAA >60 02/06/2021 1121   GFRAA 108 06/30/2017 1815   Lab Results  Component Value Date    WBC 9.3 07/16/2021   NEUTROABS 9.0 (H) 07/15/2021   HGB 13.5 07/16/2021   HCT 39.9 07/16/2021   MCV 88.9 07/16/2021   PLT 186 07/16/2021   Imaging:  [facility] Clinician Interpretation: I have personally reviewed the CNS images as listed.  My interpretation, in the context of the patient's clinical presentation, is treatment effect vs true progression  MR BRAIN W WO CONTRAST  Result Date: 08/31/2021 CLINICAL DATA:  66 year old female Pleomorphic xanthoastrocytoma, who grade 2. Original tumor resection in 2015. Progression and Re-resection in 2022, pathology unchanged. Status post IMRT in January and February this year. Restaging. EXAM: MRI HEAD WITHOUT AND WITH CONTRAST TECHNIQUE: Multiplanar, multiecho pulse sequences of the brain and surrounding structures were obtained without and with intravenous contrast. CONTRAST:  33m GADAVIST GADOBUTROL 1 MMOL/ML IV SOLN COMPARISON:  06/27/2021 and earlier. FINDINGS: Brain: Ongoing highly heterogeneous enhancement in the right hemisphere at the temporal lobe resection site and tracking into the left deep white matter capsules. However, enhancement character is less solid when compared to 05/30/2021 such as in the posteroinferior margin (series 16, image 71 now versus series 16, image 60 previously), anterior margin (image 85), and superior margin (series 16, image 97). However, craniocaudal size of abnormal enhancement has progressed since April from about 34 mm at that time to about 40 mm now (compare series 17, image 13 now to series 19, image [id number]. At the same time, the regional T2 and FLAIR hyperintensity are only mildly progressed (right superior frontal gyrus white matter series 15, image 9, middle frontal gyrus image 17). And regional mass effect is unchanged. Stable midline shift of 5 mm to the left, mass effect on the right lateral ventricle. Slight progression of hemosiderin along the superior resection margin. No superimposed restricted diffusion  suggestive of acute infarction. No ventriculomegaly, or acute intracranial hemorrhage. Cervicomedullary junction and pituitary are within normal limits. Basilar cisterns remain normal. No new areas of abnormal intracranial enhancement. Stable mild craniotomy related dural thickening. Vascular: Major intracranial vascular flow voids are stable. The major dural venous sinuses are enhancing and appear to be patent. Skull and upper cervical spine: Visualized bone marrow signal is within normal limits. Negative visible cervical spine, spinal cord. Sinuses/Orbits: Stable, negative. Other: Stable mild right mastoid effusion. Postoperative changes to the right scalp. IMPRESSION:  1. Evolution of the treated Right hemisphere lesion since April with mild increase in enhancing lesion size (coronal series 17, image 13), although areas of less solid enhancement now, and only modest increase in regional T2/FLAIR hyperintensity. Unchanged regional mass effect. Favor treatment effect over genuine tumor progression at this time, but recommend continued MRI surveillance. 2. No new intracranial abnormality Electronically Signed   By: Genevie Ann M.D.   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  She does exhibit side effects of corticosteroids.  MRI brain demonstrated subtle increase in enhancing volume.  This may be related to treatment effect, withdrawal of dexamethasone, or modest tumor progression.  We recommended one additional imaging study, repeat MRI brain in 6-8 weeks.  If further progression is noted, we could consider biopsy, avastin, dabrafenib/trametinib depending on evolving pattern and clinical scenario.  Keppra should con't 761m BID.  Decadron should be decreased to 19mdaily x7 days, then stopped if tolerated.  She understands she needs to be seizure free for 6 months before driving again.  We ask that Lindsey Daniels return to clinic in 6-8 weeks months following next brain MRI, or sooner as needed.  All questions were answered. The patient knows to call the clinic with any problems, questions or concerns. No barriers to learning were detected.  The total time spent in the encounter was 30 minutes and more than 50% was on counseling and review of test results   ZaVentura SellersMD Medical Director of Neuro-Oncology [facility]7/17/23 12:01 PM

## 2021-09-02 ENCOUNTER — Telehealth: Payer: Self-pay | Admitting: Internal Medicine

## 2021-09-02 ENCOUNTER — Encounter: Payer: Self-pay | Admitting: *Deleted

## 2021-09-02 NOTE — Telephone Encounter (Signed)
Per 7/17 los called and spoke to pt about appointment.  Pt confirmed appointment

## 2021-09-11 ENCOUNTER — Telehealth: Payer: Self-pay | Admitting: *Deleted

## 2021-09-11 NOTE — Telephone Encounter (Signed)
Received vm from patient regarding some facial swelling.  She also called to request verbal ok to return to work for her employer.  Attempted to call patient back to get further details left message pending call back.

## 2021-09-22 ENCOUNTER — Emergency Department (HOSPITAL_COMMUNITY): Payer: BC Managed Care – PPO

## 2021-09-22 ENCOUNTER — Encounter (HOSPITAL_COMMUNITY): Payer: Self-pay | Admitting: Emergency Medicine

## 2021-09-22 ENCOUNTER — Other Ambulatory Visit: Payer: Self-pay

## 2021-09-22 ENCOUNTER — Observation Stay (HOSPITAL_COMMUNITY)
Admission: EM | Admit: 2021-09-22 | Discharge: 2021-09-23 | Disposition: A | Discharge status: Home Health | Payer: BC Managed Care – PPO | Attending: Internal Medicine | Admitting: Internal Medicine

## 2021-09-22 ENCOUNTER — Telehealth: Payer: Self-pay

## 2021-09-22 DIAGNOSIS — G9341 Metabolic encephalopathy: Secondary | ICD-10-CM | POA: Diagnosis not present

## 2021-09-22 DIAGNOSIS — I10 Essential (primary) hypertension: Secondary | ICD-10-CM

## 2021-09-22 DIAGNOSIS — R2681 Unsteadiness on feet: Secondary | ICD-10-CM

## 2021-09-22 DIAGNOSIS — D496 Neoplasm of unspecified behavior of brain: Secondary | ICD-10-CM

## 2021-09-22 DIAGNOSIS — Z79899 Other long term (current) drug therapy: Secondary | ICD-10-CM | POA: Diagnosis not present

## 2021-09-22 DIAGNOSIS — R519 Headache, unspecified: Secondary | ICD-10-CM

## 2021-09-22 DIAGNOSIS — R531 Weakness: Secondary | ICD-10-CM | POA: Diagnosis not present

## 2021-09-22 DIAGNOSIS — G40909 Epilepsy, unspecified, not intractable, without status epilepticus: Secondary | ICD-10-CM

## 2021-09-22 DIAGNOSIS — E785 Hyperlipidemia, unspecified: Secondary | ICD-10-CM | POA: Diagnosis present

## 2021-09-22 DIAGNOSIS — F32A Depression, unspecified: Secondary | ICD-10-CM | POA: Diagnosis present

## 2021-09-22 DIAGNOSIS — R29898 Other symptoms and signs involving the musculoskeletal system: Secondary | ICD-10-CM

## 2021-09-22 LAB — BASIC METABOLIC PANEL
Anion gap: 12 (ref 5–15)
BUN: 17 mg/dL (ref 8–23)
CO2: 25 mmol/L (ref 22–32)
Calcium: 9.2 mg/dL (ref 8.9–10.3)
Chloride: 101 mmol/L (ref 98–111)
Creatinine, Ser: 0.78 mg/dL (ref 0.44–1.00)
GFR, Estimated: >60 mL/min (ref >60)
Glucose, Bld: 122 mg/dL — ABNORMAL HIGH (ref 70–99)
Potassium: 3.3 mmol/L — ABNORMAL LOW (ref 3.5–5.1)
Sodium: 138 mmol/L (ref 135–145)

## 2021-09-22 LAB — URINALYSIS, ROUTINE W REFLEX MICROSCOPIC
Bilirubin Urine: NEGATIVE
Glucose, UA: NEGATIVE mg/dL
Hgb urine dipstick: NEGATIVE
Ketones, ur: 20 mg/dL — AB
Nitrite: NEGATIVE
Protein, ur: NEGATIVE mg/dL
Specific Gravity, Urine: 1.028 (ref 1.005–1.030)
pH: 5 (ref 5.0–8.0)

## 2021-09-22 LAB — CBC WITH DIFFERENTIAL/PLATELET
Abs Immature Granulocytes: 0.09 10*3/uL — ABNORMAL HIGH (ref 0.00–0.07)
Basophils Absolute: 0 10*3/uL (ref 0.0–0.1)
Basophils Relative: 0 %
Eosinophils Absolute: 0 10*3/uL (ref 0.0–0.5)
Eosinophils Relative: 0 %
HCT: 44.7 % (ref 36.0–46.0)
Hemoglobin: 15.5 g/dL — ABNORMAL HIGH (ref 12.0–15.0)
Immature Granulocytes: 1 %
Lymphocytes Relative: 14 %
Lymphs Abs: 1.2 10*3/uL (ref 0.7–4.0)
MCH: 31.4 pg (ref 26.0–34.0)
MCHC: 34.7 g/dL (ref 30.0–36.0)
MCV: 90.7 fL (ref 80.0–100.0)
Monocytes Absolute: 0.8 10*3/uL (ref 0.1–1.0)
Monocytes Relative: 10 %
Neutro Abs: 6.4 10*3/uL (ref 1.7–7.7)
Neutrophils Relative %: 75 %
Platelets: 284 10*3/uL (ref 150–400)
RBC: 4.93 MIL/uL (ref 3.87–5.11)
RDW: 13.4 % (ref 11.5–15.5)
WBC: 8.6 10*3/uL (ref 4.0–10.5)
nRBC: 0 % (ref 0.0–0.2)

## 2021-09-22 LAB — MAGNESIUM: Magnesium: 2 mg/dL (ref 1.7–2.4)

## 2021-09-22 MED ORDER — IOHEXOL 300 MG/ML  SOLN
75.0000 mL | Freq: Once | INTRAMUSCULAR | Status: AC | PRN
  Administered 2021-09-22: 75 mL via INTRAVENOUS

## 2021-09-22 MED ORDER — LORAZEPAM 1 MG PO TABS
1.0000 mg | ORAL_TABLET | Freq: Three times a day (TID) | ORAL | Status: DC | PRN
Start: 2021-09-22 — End: 2021-09-23

## 2021-09-22 MED ORDER — LORATADINE 10 MG PO TABS
10.0000 mg | ORAL_TABLET | Freq: Every day | ORAL | Status: DC
  Administered 2021-09-22 – 2021-09-23 (×2): 10 mg via ORAL
  Filled 2021-09-22 (×2): qty 1

## 2021-09-22 MED ORDER — ADULT MULTIVITAMIN W/MINERALS CH
1.0000 | ORAL_TABLET | Freq: Every day | ORAL | Status: DC
  Administered 2021-09-22: 1 via ORAL
  Filled 2021-09-22: qty 1

## 2021-09-22 MED ORDER — HYDROCODONE-ACETAMINOPHEN 5-325 MG PO TABS: 1.0000 | ORAL_TABLET | ORAL | Status: DC | PRN

## 2021-09-22 MED ORDER — TRIAMCINOLONE ACETONIDE 55 MCG/ACT NA AERO
2.0000 | INHALATION_SPRAY | Freq: Every day | NASAL | Status: DC
  Administered 2021-09-22: 2 via NASAL
  Filled 2021-09-22: qty 21.6

## 2021-09-22 MED ORDER — DEXAMETHASONE 4 MG PO TABS
2.0000 mg | ORAL_TABLET | Freq: Every day | ORAL | Status: DC
  Administered 2021-09-23: 2 mg via ORAL
  Filled 2021-09-22: qty 1

## 2021-09-22 MED ORDER — LEVETIRACETAM 500 MG PO TABS
750.0000 mg | ORAL_TABLET | Freq: Two times a day (BID) | ORAL | Status: DC
  Administered 2021-09-22 – 2021-09-23 (×2): 750 mg via ORAL
  Filled 2021-09-22 (×2): qty 1

## 2021-09-22 MED ORDER — ONDANSETRON HCL 4 MG/2ML IJ SOLN: 4.0000 mg | Freq: Four times a day (QID) | INTRAMUSCULAR | Status: DC | PRN

## 2021-09-22 MED ORDER — FLUTICASONE PROPIONATE 50 MCG/ACT NA SUSP
1.0000 | Freq: Every day | NASAL | Status: DC
  Administered 2021-09-23: 2 via NASAL
  Filled 2021-09-22: qty 16

## 2021-09-22 MED ORDER — ACETAMINOPHEN 650 MG RE SUPP: 650.0000 mg | Freq: Four times a day (QID) | RECTAL | Status: DC | PRN

## 2021-09-22 MED ORDER — SODIUM CHLORIDE (PF) 0.9 % IJ SOLN
INTRAMUSCULAR | Status: AC
  Filled 2021-09-22: qty 50

## 2021-09-22 MED ORDER — HYDRALAZINE HCL 20 MG/ML IJ SOLN
10.0000 mg | Freq: Three times a day (TID) | INTRAMUSCULAR | Status: DC | PRN
  Administered 2021-09-22: 10 mg via INTRAVENOUS
  Filled 2021-09-22: qty 1

## 2021-09-22 MED ORDER — SERTRALINE HCL 100 MG PO TABS
100.0000 mg | ORAL_TABLET | Freq: Every day | ORAL | Status: DC
  Administered 2021-09-22: 100 mg via ORAL
  Filled 2021-09-22: qty 1

## 2021-09-22 MED ORDER — ATORVASTATIN CALCIUM 20 MG PO TABS
20.0000 mg | ORAL_TABLET | Freq: Every day | ORAL | Status: DC
  Administered 2021-09-22 – 2021-09-23 (×2): 20 mg via ORAL
  Filled 2021-09-22 (×2): qty 1

## 2021-09-22 MED ORDER — ONDANSETRON HCL 4 MG PO TABS: 4.0000 mg | ORAL_TABLET | Freq: Four times a day (QID) | ORAL | Status: DC | PRN

## 2021-09-22 MED ORDER — ACETAMINOPHEN 325 MG PO TABS: 650.0000 mg | ORAL_TABLET | Freq: Four times a day (QID) | ORAL | Status: DC | PRN

## 2021-09-22 MED ORDER — DEXAMETHASONE SODIUM PHOSPHATE 10 MG/ML IJ SOLN
10.0000 mg | Freq: Once | INTRAMUSCULAR | Status: AC
  Administered 2021-09-22: 10 mg via INTRAVENOUS
  Filled 2021-09-22: qty 1

## 2021-09-22 NOTE — Telephone Encounter (Signed)
T/C from pt's friend, Mateo Flow, stating pt has been disoriented to the point she does not know the date or time of day. She has been complaining of headaches.  Was advised before to take a covid test and it was negative.  She is very concerned and states this is different.  Advised to go to the ED at [facility] for further evaluation and she agreed with this plan.

## 2021-09-22 NOTE — ED Provider Notes (Signed)
[facility] DEPT Provider Note   CSN: [id number] Arrival date & time: 09/22/21  1043     History {Add pertinent medical, surgical, social history, OB history to HPI:1} Chief Complaint  Patient presents with   Headache   Altered Mental Status    Lindsey Daniels is a 66 y.o. female.  She has a history of a right temporal pleomorphic xanthoastrocytoma and is status post resection x 2 most recently about 10 months ago.  She has received radiation and follows with Dr. Mickeal Skinner.  Just tapered off of Decadron a week ago.  Brought in by friend for increased headaches and possible confusion.  She also states she has had increased balance problems.  No chest pain abdominal pain.  Does have troubles with nausea and vomiting at times.  No reported fevers.  The history is provided by the patient.  Headache Pain location:  Generalized Onset quality:  Unable to specify Timing:  Unable to specify Progression:  Unable to specify Associated symptoms: no abdominal pain, no cough and no fever   Altered Mental Status Presenting symptoms: confusion   Associated symptoms: headaches   Associated symptoms: no abdominal pain and no fever        Home Medications Prior to Admission medications   Medication Sig Start Date End Date Taking? Authorizing Provider  atorvastatin (LIPITOR) 20 MG tablet Take 1 tablet (20 mg total) by mouth daily. 07/25/17   Harrison Mons, PA  cholecalciferol (VITAMIN D) 1000 units tablet Take 1,000 Units by mouth daily.    [provider]  fluticasone (FLONASE) 50 MCG/ACT nasal spray Place 1-2 sprays into both nostrils daily. 11/06/20   Wieters, Hallie C, PA-C  levETIRAcetam (KEPPRA) 250 MG tablet Take 3 tablets (750 mg total) by mouth 2 (two) times daily. 07/16/21 10/14/21  Mercy Riding, MD  LORazepam (ATIVAN) 1 MG tablet Take 1 tablet (1 mg total) by mouth every 8 (eight) hours. 07/22/21   Ventura Sellers, MD  Multiple Vitamin (MULTIVITAMIN  WITH MINERALS) TABS tablet Take 1 tablet by mouth at bedtime.    [provider]  sertraline (ZOLOFT) 100 MG tablet Take 1 tablet (100 mg total) by mouth daily. 06/30/17   Harrison Mons, PA  triamcinolone (NASACORT) 55 MCG/ACT AERO nasal inhaler Place 2 sprays into the nose at bedtime. In to each nostril 12/20/18   Rozetta Nunnery, MD  valACYclovir (VALTREX) 500 MG tablet Take 1 tablet (500 mg total) by mouth 2 (two) times daily. Takes only if needed for breakouts Patient not taking: Reported on 04/01/2021 06/30/17   Harrison Mons, PA      Allergies    Rifapentine, Amoxicillin, and Clavulanic acid    Review of Systems   Review of Systems  Unable to perform ROS: Mental status change  Constitutional:  Negative for fever.  Respiratory:  Negative for cough.   Gastrointestinal:  Negative for abdominal pain.  Neurological:  Positive for headaches.  Psychiatric/Behavioral:  Positive for confusion.     Physical Exam Updated Vital Signs BP (!) 147/99 (BP Location: Right Arm)   Pulse 99   Temp 97.8 F (36.6 C) (Oral)   Resp 18   SpO2 100%  Physical Exam Vitals and nursing note reviewed.  Constitutional:      General: She is not in acute distress.    Appearance: She is well-developed.  HENT:     Head: Normocephalic and atraumatic.  Eyes:     Conjunctiva/sclera: Conjunctivae normal.  Cardiovascular:  Rate and Rhythm: Normal rate and regular rhythm.     Heart sounds: No murmur heard. Pulmonary:     Effort: Pulmonary effort is normal. No respiratory distress.     Breath sounds: Normal breath sounds.  Abdominal:     Palpations: Abdomen is soft.     Tenderness: There is no abdominal tenderness. There is no guarding.  Musculoskeletal:        General: No swelling.     Cervical back: Neck supple.  Skin:    General: Skin is warm and dry.     Capillary Refill: Capillary refill takes less than 2 seconds.  Neurological:     Mental Status: She is alert and oriented to  person, place, and time.     GCS: GCS eye subscore is 4. GCS verbal subscore is 5. GCS motor subscore is 6.     Cranial Nerves: No cranial nerve deficit, dysarthria or facial asymmetry.     Motor: Weakness present.     Comments: She has some weakness of her left arm with a little bit of drift in her left hand grip.  Left leg also appears slightly weak.  She had difficulty navigating from the wheelchair to get into the bed.     ED Results / Procedures / Treatments   Labs (all labs ordered are listed, but only abnormal results are displayed) Labs Reviewed - No data to display  EKG None  Radiology No results found.  Procedures Procedures  {Document cardiac monitor, telemetry assessment procedure when appropriate:1}  Medications Ordered in ED Medications - No data to display  ED Course/ Medical Decision Making/ A&P                           Medical Decision Making Amount and/or Complexity of Data Reviewed Labs: ordered. Radiology: ordered.  This patient complains of ***; this involves an extensive number of treatment Options and is a complaint that carries with it a high risk of complications and morbidity. The differential includes ***  I ordered, reviewed and interpreted labs, which included *** I ordered medication *** and reviewed PMP when indicated. I ordered imaging studies which included *** and I independently    visualized and interpreted imaging which showed *** Additional history obtained from *** Previous records obtained and reviewed *** I consulted *** and discussed lab and imaging findings and discussed disposition.  Cardiac monitoring reviewed, *** Social determinants considered, *** Critical Interventions: ***  After the interventions stated above, I reevaluated the patient and found *** Admission and further testing considered, ***    {Document critical care time when appropriate:1} {Document review of labs and clinical decision tools ie heart  score, Chads2Vasc2 etc:1}  {Document your independent review of radiology images, and any outside records:1} {Document your discussion with family members, caretakers, and with consultants:1} {Document social determinants of health affecting pt's care:1} {Document your decision making why or why not admission, treatments were needed:1} Final Clinical Impression(s) / ED Diagnoses Final diagnoses:  None    Rx / DC Orders ED Discharge Orders     None

## 2021-09-22 NOTE — ED Notes (Signed)
Patient transported to CT 

## 2021-09-22 NOTE — H&P (Signed)
History and Physical    Patient: Lindsey Daniels EGB:[id number] DOB: [birth year] DOA: 09/22/2021 DOS: the patient was seen and examined on 09/22/2021 PCP: Harrison Mons, [place]  Patient coming from: Home  Chief Complaint:  Chief Complaint  Patient presents with   Headache   Altered Mental Status   HPI: Lindsey Daniels is a 66 y.o. 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 Presenting with 2 weeks of weakness and increasing headache. History from patient and friend. The patient reports that she has had problems over the last 2 weeks with increased headache and balance problems. She thought it was because of sinus. She tried some OTC meds, but it wasn't helpful. Her friend noticed that she was more disoriented, dizzy, and having more headache since tapering down her decadron. Her friend noticed this morning that she was wobbly and not making sense, so she brought the patient to the ED for evaluation. Her friend reports since she's been in the ED, she has been more coherent.   Review of Systems: As mentioned in the history of present illness. All other systems reviewed and are negative. Past Medical History:  Diagnosis Date   Allergy    Anal fissure    Anxiety    Cataract    Depression    Elevated LFTs    Hyperglycemia    Hyperlipidemia    Hypertension    Osteoarthritis    Pars defect of lumbar spine    Seizures (HCC)    Spondylolisthesis    lumbar   Past Surgical History:  Procedure Laterality Date   cataract     CRANIOTOMY N/A 10/02/2013   Procedure: 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;  Surgeon: Hosie Spangle, MD;  Location: [place];  Service: Neurosurgery;  Laterality: N/A;  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    CRANIOTOMY Right 11/29/2020   Procedure: Right Parietal craniotomy for tumor resection;  Surgeon: Ashok Pall, MD;  Location: [place];  Service: Neurosurgery;  Laterality: Right;   HEEL SPUR SURGERY     Social History:  reports that she has never smoked. She has never used smokeless tobacco. She reports current alcohol use. She reports that she does not use drugs.  Allergies  Allergen Reactions   Rifapentine Other (See Comments)    Flu-like symptoms   Amoxicillin Rash   Clavulanic Acid Rash    Family History  Problem Relation Age of Onset   Mental illness Father        depression   COPD Father    Alcohol abuse Brother    Breast cancer Neg Hx     Prior to Admission medications   Medication Sig Start Date End Date Taking? Authorizing Provider  atorvastatin (LIPITOR) 20 MG tablet Take 1 tablet (20 mg total) by mouth daily. 07/25/17  Yes Jeffery, Domingo Mend, PA  cetirizine (ZYRTEC) 10 MG tablet Take 10 mg by mouth daily.   Yes [provider]  cholecalciferol (VITAMIN D) 1000 units tablet Take 1,000 Units by mouth daily.   Yes [provider]  fluticasone (FLONASE) 50 MCG/ACT nasal spray Place 1-2 sprays into both nostrils daily. 11/06/20  Yes Wieters, Hallie C, PA-C  levETIRAcetam (KEPPRA) 250 MG tablet Take 3 tablets (750 mg total) by mouth 2 (two) times daily. 07/16/21 10/14/21 Yes Mercy Riding, MD  LORazepam (ATIVAN) 1 MG tablet Take 1 tablet (1 mg total) by mouth every 8 (eight) hours. Patient taking differently: Take 1 mg by mouth every 8 (eight) hours as needed for anxiety. 07/22/21  Yes Vaslow, Acey Lav, MD  Multiple Vitamin (MULTIVITAMIN WITH MINERALS) TABS tablet Take 1 tablet by mouth at bedtime.   Yes [provider]  sertraline (ZOLOFT) 100 MG tablet Take 1 tablet (100 mg total) by mouth daily. Patient taking differently: Take 100 mg by mouth at bedtime. 06/30/17  Yes Jeffery, Chelle, PA  triamcinolone (NASACORT) 55 MCG/ACT AERO nasal inhaler Place 2  sprays into the nose at bedtime. In to each nostril 12/20/18  Yes Rozetta Nunnery, MD  valACYclovir (VALTREX) 500 MG tablet Take 1 tablet (500 mg total) by mouth 2 (two) times daily. Takes only if needed for breakouts Patient taking differently: Take 500 mg by mouth 2 (two) times daily as needed (for breakout). 06/30/17  Yes Harrison Mons, PA    Physical Exam: Vitals:   09/22/21 1050 09/22/21 1200 09/22/21 1300 09/22/21 1403  BP: (!) 147/99 (!) 181/109 (!) 171/95 (!) 178/93  Pulse: 99 88 84 84  Resp: '18 18 17 18  '$ Temp: 97.8 F (36.6 C)   98.1 F (36.7 C)  TempSrc: Oral   Oral  SpO2: 100% 96% 97% 100%   General: 66 y.o. female resting in bed in NAD Eyes: PERRL, normal sclera ENMT: Nares patent w/o discharge, orophaynx clear, dentition normal, ears w/o discharge/lesions/ulcers Neck: Supple, trachea midline Cardiovascular: RRR, +S1, S2, no m/g/r, equal pulses throughout Respiratory: CTABL, no w/r/r, normal WOB GI: BS+, NDNT, no masses noted, no organomegaly noted MSK: No e/c/c Neuro: A&O x 3, msk str 4-5/5 BLE Psyc: Appropriate interaction but flat affect, calm/cooperative  Data Reviewed:  Lab Results  Component Value Date   NA 138 09/22/2021   K 3.3 (L) 09/22/2021   CO2 25 09/22/2021   GLUCOSE 122 (H) 09/22/2021   BUN 17 09/22/2021   CREATININE 0.78 09/22/2021   CALCIUM 9.2 09/22/2021   GFRNONAA >60 09/22/2021   Lab Results  Component Value Date   WBC 8.6 09/22/2021   HGB 15.5 (H) 09/22/2021   HCT 44.7 09/22/2021   MCV 90.7 09/22/2021   PLT 284 09/22/2021   CXR:  No acute cardiopulmonary process.  CTH: 1. When comparing across modalities to MRI head from August 29, 2021, suspected progression of known tumor with increased edema and mass effect with 11 mm of leftward midline shift at the foramen of Monro (previously 4 mm when remeasured). An MRI with contrast could further characterize if clinically warranted. 2. Mild rounding of the left temporal horn and  left lateral ventricle is new from the prior and suspicious for ventricular entrapment.  Assessment and Plan: Acute metabolic encephalopathy Brain tumor     - place in obs, tele     - EDP spoke with Dr. Mickeal Skinner who reviewed her case/imaging; recommended '10mg'$  decadron today and start '2mg'$  decadron qday tomorrow     - mentation is already improving per friend who brought her to the ED; on my assessment she is A&O x 3 and able to clearly recall conversation w/ EDP and plan going forward; however, her gait/motor fxn is still weak  Weakness Headache     - secondary to above     - steroids, pain control     - she has 4-5/5 strength BLE; requires assistance w/ transfer and to get to bathroom     - fall  precautions     - PT/OT eval  Seizure d/o     - secondary to above     - continue home regimen  HTN     - not on meds at home     - will have PRN available  HLD     - continue home regimen  Depression     - continue home regimen  Advance Care Planning:   Code Status: FULL  Consults: EDP spoke with Neurooncology  Family Communication: w/ friend who brought her to the ED  Severity of Illness: The appropriate patient status for this patient is OBSERVATION. Observation status is judged to be reasonable and necessary in order to provide the required intensity of service to ensure the patient's safety. The patient's presenting symptoms, physical exam findings, and initial radiographic and laboratory data in the context of their medical condition is felt to place them at decreased risk for further clinical deterioration. Furthermore, it is anticipated that the patient will be medically stable for discharge from the hospital within 2 midnights of admission.   Author: Jonnie Finner, DO 09/22/2021 2:43 PM  For on call review [email or web address].

## 2021-09-22 NOTE — ED Triage Notes (Signed)
Pt states she has a headache, ear ache and nausea. Pt not making sense in triage, disoriented. Pt reports a hx of brain surgery.

## 2021-09-22 NOTE — ED Notes (Signed)
Pt able to ambulate to restroom with standby assist. Gait unsteady.

## 2021-09-22 NOTE — Plan of Care (Signed)

## 2021-09-23 DIAGNOSIS — G40909 Epilepsy, unspecified, not intractable, without status epilepticus: Secondary | ICD-10-CM

## 2021-09-23 DIAGNOSIS — I159 Secondary hypertension, unspecified: Secondary | ICD-10-CM

## 2021-09-23 DIAGNOSIS — G9341 Metabolic encephalopathy: Secondary | ICD-10-CM

## 2021-09-23 DIAGNOSIS — D496 Neoplasm of unspecified behavior of brain: Secondary | ICD-10-CM

## 2021-09-23 DIAGNOSIS — R519 Headache, unspecified: Secondary | ICD-10-CM | POA: Diagnosis not present

## 2021-09-23 LAB — CBC
HCT: 42.6 % (ref 36.0–46.0)
Hemoglobin: 15 g/dL (ref 12.0–15.0)
MCH: 31.6 pg (ref 26.0–34.0)
MCHC: 35.2 g/dL (ref 30.0–36.0)
MCV: 89.9 fL (ref 80.0–100.0)
Platelets: 316 10*3/uL (ref 150–400)
RBC: 4.74 MIL/uL (ref 3.87–5.11)
RDW: 13.4 % (ref 11.5–15.5)
WBC: 9.8 10*3/uL (ref 4.0–10.5)
nRBC: 0 % (ref 0.0–0.2)

## 2021-09-23 LAB — COMPREHENSIVE METABOLIC PANEL
ALT: 18 U/L (ref 0–44)
AST: 16 U/L (ref 15–41)
Albumin: 4 g/dL (ref 3.5–5.0)
Alkaline Phosphatase: 68 U/L (ref 38–126)
Anion gap: 11 (ref 5–15)
BUN: 17 mg/dL (ref 8–23)
CO2: 23 mmol/L (ref 22–32)
Calcium: 9.6 mg/dL (ref 8.9–10.3)
Chloride: 104 mmol/L (ref 98–111)
Creatinine, Ser: 0.58 mg/dL (ref 0.44–1.00)
GFR, Estimated: >60 mL/min (ref >60)
Glucose, Bld: 117 mg/dL — ABNORMAL HIGH (ref 70–99)
Potassium: 3.3 mmol/L — ABNORMAL LOW (ref 3.5–5.1)
Sodium: 138 mmol/L (ref 135–145)
Total Bilirubin: 1.1 mg/dL (ref 0.3–1.2)
Total Protein: 6.8 g/dL (ref 6.5–8.1)

## 2021-09-23 MED ORDER — METOPROLOL TARTRATE 5 MG/5ML IV SOLN: 5.0000 mg | INTRAVENOUS | Status: DC | PRN

## 2021-09-23 MED ORDER — POTASSIUM CHLORIDE 20 MEQ PO PACK
40.0000 meq | PACK | ORAL | Status: DC
  Administered 2021-09-23: 40 meq via ORAL
  Filled 2021-09-23: qty 2

## 2021-09-23 MED ORDER — POTASSIUM CHLORIDE 20 MEQ PO PACK: 40.0000 meq | PACK | Freq: Once | ORAL | 0 refills | Status: DC

## 2021-09-23 MED ORDER — SENNOSIDES-DOCUSATE SODIUM 8.6-50 MG PO TABS: 1.0000 | ORAL_TABLET | Freq: Every evening | ORAL | Status: DC | PRN

## 2021-09-23 MED ORDER — TRAZODONE HCL 50 MG PO TABS: 50.0000 mg | ORAL_TABLET | Freq: Every evening | ORAL | Status: DC | PRN

## 2021-09-23 MED ORDER — DEXAMETHASONE 2 MG PO TABS: 2.0000 mg | ORAL_TABLET | Freq: Every day | ORAL | 0 refills | Status: DC

## 2021-09-23 MED ORDER — HYDRALAZINE HCL 20 MG/ML IJ SOLN: 10.0000 mg | INTRAMUSCULAR | Status: DC | PRN

## 2021-09-23 MED ORDER — IPRATROPIUM-ALBUTEROL 0.5-2.5 (3) MG/3ML IN SOLN: 3.0000 mL | RESPIRATORY_TRACT | Status: DC | PRN

## 2021-09-23 MED ORDER — GUAIFENESIN 100 MG/5ML PO LIQD: 5.0000 mL | ORAL | Status: DC | PRN

## 2021-09-23 NOTE — TOC CM/SW Note (Deleted)
  Transition of Care [facility]) Screening Note   Patient Details  Name: Lindsey Daniels Date of Birth: Mar 06, 1955   Transition of Care [facility]) CM/SW Contact:    Ross Ludwig, LCSW Phone Number: 09/23/2021, 11:27 AM    Transition of Care Department [facility]) has reviewed patient and no TOC needs have been identified at this time. We will continue to monitor patient advancement through interdisciplinary progression rounds. If new patient transition needs arise, please place a TOC consult.

## 2021-09-23 NOTE — Evaluation (Signed)
Occupational Therapy Evaluation Patient Details Name: Lindsey Daniels MRN: [id number] DOB: 1955-05-07 Today's Date: 09/23/2021   History of Present Illness Lindsey Daniels is a 66 y.o.female presenting with 2 weeks of weakness and increasing headache with recent AMS, now resolved. CT of Head revealed: When comparing across modalities to MRI head from August 29, 2021,  suspected progression of known tumor with increased edema and mass  effect with 11 mm of leftward midline shift at the foramen of Monro.   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   Clinical Impression   Patient is currently requiring assistance with ADLs including Min guard assist with standing Lower body ADLs, setup/supervision assist with Upper body ADLs and seated LE ADLs, Min guard assist with functional transfers to toilet.  Current level of function is below patient's typical baseline.  During this evaluation, patient was limited by generalized weakness, impaired activity tolerance with HR elevation from 77 at rest to 134 standing at sink for 1 grooming task, and cognitive deficits, all of which has the potential to impact patient's safety and independence during functional mobility, as well as performance for ADLs.  Patient lives alone, and reports several friends who are able to provide frequent supervision and assistance, however pt not giving specifics and has decreased awareness of her current deficits.  Patient demonstrates fair rehab potential, and should benefit from continued skilled occupational therapy services while in acute care to maximize safety, independence and quality of life at home.  As much supervision at home that is possible is recommended at this time due to pt's cognitive  deficits and HR elevation.   ?       Recommendations for follow up therapy are one component of a multi-disciplinary discharge planning process, led by the attending physician.  Recommendations may be updated based on patient status, additional functional criteria and insurance authorization.   Follow Up Recommendations  No OT follow up (Agree with [facility] PT recommendations)    Assistance Recommended at Discharge Frequent or constant Supervision/Assistance  Patient can return home with the following A little help with walking and/or transfers;A little help with bathing/dressing/bathroom;Assist for transportation;Assistance with cooking/housework;Direct supervision/assist for financial management;Direct supervision/assist for medications management    Functional Status Assessment  Patient has had a recent decline in their functional status and demonstrates the ability to make significant improvements in function in a reasonable and predictable amount of time.  Equipment Recommendations  BSC/3in1 (RW with 5" wheels)    Recommendations for Other Services       Precautions / Restrictions Precautions Precautions: Fall Precaution Comments: Monitor HR Restrictions Weight Bearing Restrictions: No      Mobility Bed Mobility   Bed Mobility: Supine to Sit     Supine to sit: Supervision, HOB elevated     General bed mobility comments: pt taking substantial extra time, easily distracted. Would begin to mobilize then lie back down and ask "What are we doing?" Required 2 attempts to lift LEs back onto bed without external assist.    Transfers                          Balance Overall balance assessment: Needs assistance Sitting-balance support: Feet supported Sitting balance-Leahy Scale: Good     Standing balance support: Single extremity supported, During functional activity  Standing balance-Leahy Scale: Fair Standing balance comment: Able to stand at sink with one hand  on vanity to stabilize without external assistance.                           ADL either performed or assessed with clinical judgement   ADL Overall ADL's : Needs assistance/impaired Eating/Feeding: Independent   Grooming: Oral care;Standing;Cueing for safety Grooming Details (indicate cue type and reason): With HR increase from 82 to 134. Pt educated to sit as possible during B/IADLs to allow for pacing and necessary rest breaks. Multimodal cues for pt to comprehend safe postioning of RW at sink. Upper Body Bathing: Sitting;Set up   Lower Body Bathing: Supervison/ safety;Sitting/lateral leans;Sit to/from stand   Upper Body Dressing : Set up;Sitting   Lower Body Dressing: Sit to/from stand;Sitting/lateral leans;Min guard   Toilet Transfer: Rolling walker (2 wheels);Cueing for sequencing;Min guard Toilet Transfer Details (indicate cue type and reason): Pt stood from EOB to RW with supervision. Pt ambulated to sink and then returned to bed with supervision.   Toileting - Clothing Manipulation Details (indicate cue type and reason): Pt denied need to void. Would anticipate supervision.     Functional mobility during ADLs: Rolling walker (2 wheels);Cueing for sequencing;Cueing for safety;Min guard       Vision   Vision Assessment?: No apparent visual deficits     Perception     Praxis      Pertinent Vitals/Pain Pain Assessment Pain Assessment: No/denies pain     Hand Dominance Right   Extremity/Trunk Assessment Upper Extremity Assessment Upper Extremity Assessment: Overall WFL for tasks assessed   Lower Extremity Assessment Lower Extremity Assessment: Overall WFL for tasks assessed;RLE deficits/detail;LLE deficits/detail RLE Deficits / Details: 4-/5 hip felxion knee felx/ext and ankle dorsiflexion LLE Deficits / Details: 4-/5 hip felxion knee felx/ext and ankle dorsiflexion   Cervical / Trunk Assessment Cervical / Trunk Assessment: Normal   Communication  Communication Communication: Other (comment) (Speech slurred)   Cognition Arousal/Alertness: Lethargic Behavior During Therapy: WFL for tasks assessed/performed Overall Cognitive Status: No family/caregiver present to determine baseline cognitive functioning Area of Impairment: Attention, Memory, Following commands, Awareness, Problem solving                   Current Attention Level: Selective   Following Commands: Follows one step commands with increased time, Follows multi-step commands with increased time, Follows multi-step commands inconsistently, Follows one step commands consistently   Awareness: Emergent Problem Solving: Slow processing, Decreased initiation, Difficulty sequencing, Requires verbal cues General Comments: pt pleasant and oriented to situation and aware of processing deficits     General Comments       Exercises     Shoulder Instructions      Home Living Family/patient expects to be discharged to:: Private residence Living Arrangements: Alone Available Help at Discharge: Family;Friend(s);Neighbor Type of Home: House Home Access: Stairs to enter [employer] of Steps: 1 small threshold Entrance Stairs-Rails: None Home Layout: One level     Bathroom Shower/Tub: Occupational psychologist: Standard Bathroom Accessibility: Yes   Home Equipment: Cane - single point;Grab bars - tub/shower          Prior Functioning/Environment Prior Level of Function : Working/employed;Driving;Independent/Modified Independent                        OT Problem List: Decreased activity tolerance      OT Treatment/Interventions: Self-care/ADL  training;Therapeutic activities;Cognitive remediation/compensation;Energy conservation;Balance training;DME and/or AE instruction;Patient/family education    OT Goals(Current goals can be found in the care plan section) Acute Rehab OT Goals Patient Stated Goal: Go home today and get back to  work. OT Goal Formulation: With patient Time For Goal Achievement: 10/07/21 Potential to Achieve Goals: Fair ADL Goals Pt Will Perform Lower Body Bathing: with modified independence;sitting/lateral leans;sit to/from stand Pt Will Perform Lower Body Dressing: with modified independence;sitting/lateral leans;sit to/from stand Pt Will Transfer to Toilet: with modified independence;ambulating (With HR no greater than 120s) Pt Will Perform Toileting - Clothing Manipulation and hygiene: with modified independence Pt Will Perform Tub/Shower Transfer: with supervision;grab bars Additional ADL Goal #1: Pt will engage in 15 min functional activities without loss of sitting or standing balance, in order to demonstrate improved activity tolerance and balance needed to perform ADLs safely at home with vitals with safe limits.  OT Frequency: Min 2X/week    Co-evaluation              AM-PAC OT "6 Clicks" Daily Activity     Outcome Measure Help from another person eating meals?: None Help from another person taking care of personal grooming?: A Little Help from another person toileting, which includes using toliet, bedpan, or urinal?: A Little Help from another person bathing (including washing, rinsing, drying)?: A Little Help from another person to put on and taking off regular upper body clothing?: A Little Help from another person to put on and taking off regular lower body clothing?: A Little 6 Click Score: 19   End of Session Equipment Utilized During Treatment: Rolling walker (2 wheels) Nurse Communication: Other (comment) (HR)  Activity Tolerance: Treatment limited secondary to medical complications (Comment) (Elevated HR to 130s with brief, light standing activity) Patient left: in bed;with call bell/phone within reach  OT Visit Diagnosis: Other symptoms and signs involving cognitive function                Time: 8177-1165 OT Time Calculation (min): 32 min Charges:  OT General  Charges $OT Visit: 1 Visit OT Evaluation $OT Eval Low Complexity: 1 Low OT Treatments $Self Care/Home Management : 8-22 mins  Anderson Malta, OT Acute Rehab Services Office: [phone] 09/23/2021  Julien Girt 09/23/2021, 12:37 PM

## 2021-09-23 NOTE — Evaluation (Signed)
Physical Therapy Evaluation Patient Details Name: Lindsey Daniels MRN: [id number] DOB: [birth year] Today's Date: 09/23/2021  History of Present Illness  Lindsey Daniels is a 66 y.o.female presenting with 2 weeks of weakness and increasing headache with recent AMS, now resolved. CT of Head revealed: When comparing across modalities to MRI head from August 29, 2021,  suspected progression of known tumor with increased edema and mass  effect with 11 mm of leftward midline shift at the foramen of Monro.   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    Clinical Impression  Lindsey Daniels is 66 y.o. female admitted with above HPI and diagnosis. Patient is currently limited by functional impairments below (see PT problem list). Patient lives alone and is independent at baseline. This session pt required in assist for bed mobility, transfers, and gait. She required assist to steady balance and simple cues with extra time to process/sequence mobility. Patient will benefit from continued skilled PT interventions to address impairments and progress independence with mobility, recommending HHPT with assist from family/friends when medically ready to discharge. Acute PT will follow and progress as able.        Recommendations for follow up therapy are one component of a multi-disciplinary discharge planning process, led by the attending physician.  Recommendations may be updated based on patient status, additional functional criteria and insurance authorization.  Follow Up Recommendations Home health PT      Assistance Recommended at Discharge Intermittent Supervision/Assistance  Patient can return home with the following  A little help with walking and/or transfers;A little  help with bathing/dressing/bathroom;Assistance with cooking/housework;Direct supervision/assist for medications management;Assist for transportation;Help with stairs or ramp for entrance    Equipment Recommendations Rolling walker (2 wheels)  Recommendations for Other Services       Functional Status Assessment Patient has had a recent decline in their functional status and demonstrates the ability to make significant improvements in function in a reasonable and predictable amount of time.     Precautions / Restrictions Precautions Precautions: Fall Precaution Comments: Monitor HR Restrictions Weight Bearing Restrictions: No      Mobility  Bed Mobility Overal bed mobility: Needs Assistance Bed Mobility: Supine to Sit     Supine to sit: Supervision, HOB elevated     General bed mobility comments: pt taking substantial extra time    Transfers Overall transfer level: Needs assistance Equipment used: Rolling walker (2 wheels) Transfers: Sit to/from Stand Sit to Stand: Min guard, Min assist           General transfer comment: guarding for sit<>stand from elevated bed height. Min assist to power up from lower surface.    Ambulation/Gait Ambulation/Gait assistance: Min assist Gait Distance (Feet): 30 Feet Assistive device: Rolling walker (2 wheels), 1 person hand held assist Gait Pattern/deviations: Step-through pattern, Decreased stride length, Shuffle, Trunk flexed Gait velocity: decr     General Gait Details: cues for safe hand placement on RW and assist to direct walker to bathroom as pt had difficulty sequencing steps and coordinating direction of gait.  Stairs            Wheelchair Mobility    Modified Rankin (Stroke Patients Only)       Balance Overall balance assessment: Needs assistance Sitting-balance support: Feet supported Sitting balance-Leahy Scale: Good     Standing balance  support: Reliant on assistive device for balance, During  functional activity, Bilateral upper extremity supported Standing balance-Leahy Scale: Fair Standing balance comment: able to stand and complete some pericare with UE support on grab bar. reaching for external support with gait                             Pertinent Vitals/Pain Pain Assessment Pain Assessment: No/denies pain    Home Living Family/patient expects to be discharged to:: Private residence Living Arrangements: Alone Available Help at Discharge: Family;Friend(s);Neighbor Type of Home: House Home Access: Stairs to enter Entrance Stairs-Rails: None Entrance Stairs-Number of Steps: 1 small threshold   Home Layout: One level Home Equipment: Cane - single point;Grab bars - tub/shower      Prior Function Prior Level of Function : Working/employed;Driving;Independent/Modified Independent                     Hand Dominance   Dominant Hand: Right    Extremity/Trunk Assessment   Upper Extremity Assessment Upper Extremity Assessment: Defer to OT evaluation    Lower Extremity Assessment Lower Extremity Assessment: Overall WFL for tasks assessed;RLE deficits/detail;LLE deficits/detail RLE Deficits / Details: 4-/5 hip felxion knee felx/ext and ankle dorsiflexion LLE Deficits / Details: 4-/5 hip felxion knee felx/ext and ankle dorsiflexion    Cervical / Trunk Assessment Cervical / Trunk Assessment: Normal  Communication   Communication: No difficulties  Cognition Arousal/Alertness: Awake/alert Behavior During Therapy: WFL for tasks assessed/performed Overall Cognitive Status: No family/caregiver present to determine baseline cognitive functioning Area of Impairment: Attention, Memory, Following commands, Awareness, Problem solving                   Current Attention Level: Alternating   Following Commands: Follows one step commands with increased time, Follows multi-step commands with increased time, Follows multi-step commands  inconsistently, Follows one step commands consistently     Problem Solving: Slow processing, Decreased initiation, Difficulty sequencing, Requires verbal cues General Comments: pt pleasant and oriented to situation and aware of processing deficits        General Comments      Exercises     Assessment/Plan    PT Assessment Patient needs continued PT services  PT Problem List Decreased strength;Decreased activity tolerance;Decreased balance;Decreased mobility;Decreased knowledge of use of DME;Decreased knowledge of precautions;Cardiopulmonary status limiting activity       PT Treatment Interventions DME instruction;Gait training;Stair training;Functional mobility training;Therapeutic activities;Therapeutic exercise;Balance training;Neuromuscular re-education;Patient/family education    PT Goals (Current goals can be found in the Care Plan section)  Acute Rehab PT Goals Patient Stated Goal: get home PT Goal Formulation: With patient Time For Goal Achievement: 10/07/21 Potential to Achieve Goals: Good    Frequency Min 3X/week     Co-evaluation               AM-PAC PT "6 Clicks" Mobility  Outcome Measure Help needed turning from your back to your side while in a flat bed without using bedrails?: A Little Help needed moving from lying on your back to sitting on the side of a flat bed without using bedrails?: A Little Help needed moving to and from a bed to a chair (including a wheelchair)?: A Little Help needed standing up from a chair using your arms (e.g., wheelchair or bedside chair)?: A Little Help needed to walk in hospital room?: A Little Help needed climbing 3-5 steps with a railing? : A Lot 6 Click Score: 17  End of Session Equipment Utilized During Treatment: Gait belt Activity Tolerance: Patient tolerated treatment well;Patient limited by fatigue Patient left: in bed;with call bell/phone within reach;with bed alarm set Nurse Communication: Mobility  status PT Visit Diagnosis: Unsteadiness on feet (R26.81);Muscle weakness (generalized) (M62.81);Difficulty in walking, not elsewhere classified (R26.2)    Time: 0822-0901 PT Time Calculation (min) (ACUTE ONLY): 39 min   Charges:   PT Evaluation $PT Eval Low Complexity: 1 Low PT Treatments $Gait Training: 8-22 mins $Therapeutic Activity: 8-22 mins        Verner Mould, DPT Acute Rehabilitation Services Office [phone] Pager [phone]  09/23/21 11:54 AM

## 2021-09-23 NOTE — TOC Transition Note (Signed)
Transition of Care [facility]) - CM/SW Discharge Note   Patient Details  Name: Lindsey Daniels MRN: [id number] Date of Birth: 08-29-1955  Transition of Care [facility]) CM/SW Contact:  Ross Ludwig, LCSW Phone Number: 09/23/2021, 11:58 AM   Clinical Narrative:     CSW was informed by PT, that patient would benefit from [facility] PT, and a rolling walker.  CSW was informed by OT, that patient would benefit from a 3 in 1, CSW contacted Danielle from [facility], they will provide a rolling walker and 3 in 1 for patient prior to her discharging.  CSW spoke to patient regarding [place] therapy and patient did not have a preference for an agency.  CSW spoke to [place] at [facility], and she can accept patient for [facility] PT.  CSW to sign off, please reconsult if other social work needs arise.   Final next level of care: [place] Barriers to Discharge: Barriers Resolved   Patient Goals and CMS Choice Patient states their goals for this hospitalization and ongoing recovery are:: To return back home with home health. CMS Medicare.gov Compare Post Acute Care list provided to:: Patient Choice offered to / list presented to : Patient  Discharge Placement                       Discharge Plan and Services                DME Arranged: 3-N-1, Walker rolling DME Agency: [employer] Date DME Agency Contacted: 09/23/21 Time DME Agency Contacted: 1156 Representative spoke with at DME Agency: [place]: PT [place]: [facility] Date [place]: 09/23/21 Time [place]: 1157 Representative spoke with at [place]: [place] ([place]) Interventions     Readmission Risk Interventions     No data to display

## 2021-09-23 NOTE — Discharge Summary (Signed)
Physician Discharge Summary  Robynne Roat QIH:[id number] DOB: [birth year] DOA: 09/22/2021  PCP: Harrison Mons, PA  Admit date: 09/22/2021 Discharge date: 09/23/2021  Admitted From: Home Disposition:  Home  Recommendations for Outpatient Follow-up:  Follow up with PCP in 1-2 weeks Please obtain BMP/CBC in one week your next doctors visit.  Follow-up outpatient with Dr. Mickeal Skinner.  Decadron 2 mg daily prescribed  Discharge Condition: Stable CODE STATUS: Full code Diet recommendation: Regular  Brief/Interim Summary: 66 year old with history of right temporal pleomorphic exanthema astrocytoma status post temporal craniotomy, resection 2015, progression of this disease status post radiation, HTN, seizure disorder admitted to the hospital for worsening headache and weakness.  Upon admission CT of the head showed progressing of her tumor along with edema and masslike effect.  Case was discussed by EDP with Dr. Mickeal Skinner who recommended loading her with Decadron 10 mg and transitioning to 2 mg daily with outpatient follow-up. When I saw the patient following day she was alert awake oriented, did not have any complaints.  She wanted to get discharged after seen by PT/OT who recommended home health.      Consultations: EDP spoke with Dr. Mickeal Skinner  Subjective: Doing well no complaints.  Discharge Exam: Vitals:   09/23/21 0620 09/23/21 1209  BP: (!) 156/95   Pulse: 81 (!) 134  Resp: 17   Temp: 98 F (36.7 C)   SpO2: 95%    Vitals:   09/22/21 2203 09/23/21 0103 09/23/21 0620 09/23/21 1209  BP:  (!) 149/94 (!) 156/95   Pulse:  96 81 (!) 134  Resp:  17 17   Temp:  97.9 F (36.6 C) 98 F (36.7 C)   TempSrc:  Oral Oral   SpO2:  97% 95%   Weight: 79.8 kg     Height: '5\' 2"'$  (1.575 m)       General: Pt is alert, awake, not in acute distress Cardiovascular: RRR, S1/S2 +, no rubs, no gallops Respiratory: CTA bilaterally, no wheezing, no rhonchi Abdominal: Soft, NT, ND, bowel sounds  + Extremities: no edema, no cyanosis  Discharge Instructions   Allergies as of 09/23/2021       Reactions   Rifapentine Other (See Comments)   Flu-like symptoms   Amoxicillin Rash   Clavulanic Acid Rash        Medication List     TAKE these medications    atorvastatin 20 MG tablet Commonly known as: LIPITOR Take 1 tablet (20 mg total) by mouth daily.   cetirizine 10 MG tablet Commonly known as: ZYRTEC Take 10 mg by mouth daily.   cholecalciferol 1000 units tablet Commonly known as: VITAMIN D Take 1,000 Units by mouth daily.   dexamethasone 2 MG tablet Commonly known as: DECADRON Take 1 tablet (2 mg total) by mouth daily for 14 days. Start taking on: September 24, 2021   fluticasone 50 MCG/ACT nasal spray Commonly known as: FLONASE Place 1-2 sprays into both nostrils daily.   levETIRAcetam 250 MG tablet Commonly known as: KEPPRA Take 3 tablets (750 mg total) by mouth 2 (two) times daily.   LORazepam 1 MG tablet Commonly known as: ATIVAN Take 1 tablet (1 mg total) by mouth every 8 (eight) hours. What changed:  when to take this reasons to take this   multivitamin with minerals Tabs tablet Take 1 tablet by mouth at bedtime.   potassium chloride 20 MEQ packet Commonly known as: KLOR-CON Take 40 mEq by mouth once for 1 dose.   sertraline 100 MG tablet  Commonly known as: ZOLOFT Take 1 tablet (100 mg total) by mouth daily. What changed: when to take this   triamcinolone 55 MCG/ACT Aero nasal inhaler Commonly known as: NASACORT Place 2 sprays into the nose at bedtime. In to each nostril   valACYclovir 500 MG tablet Commonly known as: VALTREX Take 1 tablet (500 mg total) by mouth 2 (two) times daily. Takes only if needed for breakouts What changed:  when to take this reasons to take this additional instructions               Durable Medical Equipment  (From admission, onward)           Start     Ordered   09/23/21 1144  For home use only  DME 3 n 1  Once        09/23/21 1143   09/23/21 1142  For home use only DME Walker rolling  Once       Question Answer Comment  Walker: With [place]ult Wheels   Patient needs a walker to treat with the following condition Generalized weakness      09/23/21 1141            Follow-up Information     Harrison Mons, PA Follow up in 1 week(s).   Specialty: Family Medicine Contact information: [address] [place] [address] [phone]         [facility] Follow up.   Specialty: Home Health Services Why: Jackquline Denmark will reach out to you to set up the first visit for home healtth physical therapy. Contact information: [address] [place] [address] [phone]                Allergies  Allergen Reactions   Rifapentine Other (See Comments)    Flu-like symptoms   Amoxicillin Rash   Clavulanic Acid Rash    You were cared for by a hospitalist during your hospital stay. If you have any questions about your discharge medications or the care you received while you were in the hospital after you are discharged, you can call the unit and asked to speak with the hospitalist on call if the hospitalist that took care of you is not available. Once you are discharged, your primary care physician will handle any further medical issues. Please note that no refills for any discharge medications will be authorized once you are discharged, as it is imperative that you return to your primary care physician (or establish a relationship with a primary care physician if you do not have one) for your aftercare needs so that they can reassess your need for medications and monitor your lab values.   Procedures/Studies: CT Head W or Wo Contrast  Result Date: 09/22/2021 CLINICAL DATA:  Mental status change, unknown cause EXAM: CT HEAD WITHOUT AND WITH CONTRAST TECHNIQUE: Contiguous axial images were obtained from the base of the skull through the vertex  without and with intravenous contrast. RADIATION DOSE REDUCTION: This exam was performed according to the departmental dose-optimization program which includes automated exposure control, adjustment of the mA and/or kV according to patient size and/or use of iterative reconstruction technique. CONTRAST:  17m OMNIPAQUE IOHEXOL 300 MG/ML  SOLN COMPARISON:  MRI head 08/29/2021. FINDINGS: Brain: When comparing across modalities to MRI head from July 30, 2021, suspected progression of known tumor with increased edema and mass effect with 11 mm of leftward midline shift at the foramen of Monro (previously 4  mm when remeasured). An MRI with contrast could further characterize if clinically warranted. Mild rounding of the left temporal horn and left lateral ventricle is new from the prior and suspicious for ventricular entrapment. No evidence of superimposed acute large vascular territory infarct or acute hemorrhage. Vascular: No hyperdense vessel identified. Skull: Prior right pterional craniotomy.  No acute fracture. Sinuses/Orbits: Clear sinuses.  No acute orbital findings. Other: No mastoid effusions. IMPRESSION: 1. When comparing across modalities to MRI head from August 29, 2021, suspected progression of known tumor with increased edema and mass effect with 11 mm of leftward midline shift at the foramen of Monro (previously 4 mm when remeasured). An MRI with contrast could further characterize if clinically warranted. 2. Mild rounding of the left temporal horn and left lateral ventricle is new from the prior and suspicious for ventricular entrapment. Electronically Signed   By: Margaretha Sheffield M.D.   On: 09/22/2021 13:38   DG Chest Port 1 View  Result Date: 09/22/2021 CLINICAL DATA:  Altered mental status EXAM: PORTABLE CHEST 1 VIEW COMPARISON:  08/26/2014 FINDINGS: Cardiac and mediastinal contours are within normal limits. No focal pulmonary opacity. No pleural effusion or pneumothorax. No acute osseous  abnormality. IMPRESSION: No acute cardiopulmonary process. Electronically Signed   By: Merilyn Baba M.D.   On: 09/22/2021 11:21   MR BRAIN W WO CONTRAST  Result Date: 08/31/2021 CLINICAL DATA:  66 year old female Pleomorphic xanthoastrocytoma, who grade 2. Original tumor resection in 2015. Progression and Re-resection in 2022, pathology unchanged. Status post IMRT in January and February this year. Restaging. EXAM: MRI HEAD WITHOUT AND WITH CONTRAST TECHNIQUE: Multiplanar, multiecho pulse sequences of the brain and surrounding structures were obtained without and with intravenous contrast. CONTRAST:  10m GADAVIST GADOBUTROL 1 MMOL/ML IV SOLN COMPARISON:  06/27/2021 and earlier. FINDINGS: Brain: Ongoing highly heterogeneous enhancement in the right hemisphere at the temporal lobe resection site and tracking into the left deep white matter capsules. However, enhancement character is less solid when compared to 05/30/2021 such as in the posteroinferior margin (series 16, image 71 now versus series 16, image 60 previously), anterior margin (image 85), and superior margin (series 16, image 97). However, craniocaudal size of abnormal enhancement has progressed since April from about 34 mm at that time to about 40 mm now (compare series 17, image 13 now to series 19, image [id number]. At the same time, the regional T2 and FLAIR hyperintensity are only mildly progressed (right superior frontal gyrus white matter series 15, image 9, middle frontal gyrus image 17). And regional mass effect is unchanged. Stable midline shift of 5 mm to the left, mass effect on the right lateral ventricle. Slight progression of hemosiderin along the superior resection margin. No superimposed restricted diffusion suggestive of acute infarction. No ventriculomegaly, or acute intracranial hemorrhage. Cervicomedullary junction and pituitary are within normal limits. Basilar cisterns remain normal. No new areas of abnormal intracranial  enhancement. Stable mild craniotomy related dural thickening. Vascular: Major intracranial vascular flow voids are stable. The major dural venous sinuses are enhancing and appear to be patent. Skull and upper cervical spine: Visualized bone marrow signal is within normal limits. Negative visible cervical spine, spinal cord. Sinuses/Orbits: Stable, negative. Other: Stable mild right mastoid effusion. Postoperative changes to the right scalp. IMPRESSION: 1. Evolution of the treated Right hemisphere lesion since April with mild increase in enhancing lesion size (coronal series 17, image 13), although areas of less solid enhancement now, and only modest increase in regional T2/FLAIR hyperintensity. Unchanged regional mass effect.  Favor treatment effect over genuine tumor progression at this time, but recommend continued MRI surveillance. 2. No new intracranial abnormality Electronically Signed   By: Genevie Ann M.D.   On: 08/31/2021 13:40     The results of significant diagnostics from this hospitalization (including imaging, microbiology, ancillary and laboratory) are listed below for reference.     Microbiology: No results found for this or any previous visit (from the past 240 hour(s)).   Labs: BNP (last 3 results) No results for input(s): "BNP" in the last 8760 hours. Basic Metabolic Panel: Recent Labs  Lab 09/22/21 1127 09/23/21 0511  NA 138 138  K 3.3* 3.3*  CL 101 104  CO2 25 23  GLUCOSE 122* 117*  BUN 17 17  CREATININE 0.78 0.58  CALCIUM 9.2 9.6  MG 2.0  --    Liver Function Tests: Recent Labs  Lab 09/23/21 0511  AST 16  ALT 18  ALKPHOS 68  BILITOT 1.1  PROT 6.8  ALBUMIN 4.0   No results for input(s): "LIPASE", "AMYLASE" in the last 168 hours. No results for input(s): "AMMONIA" in the last 168 hours. CBC: Recent Labs  Lab 09/22/21 1127 09/23/21 0511  WBC 8.6 9.8  NEUTROABS 6.4  --   HGB 15.5* 15.0  HCT 44.7 42.6  MCV 90.7 89.9  PLT 284 316   Cardiac Enzymes: No  results for input(s): "CKTOTAL", "CKMB", "CKMBINDEX", "TROPONINI" in the last 168 hours. BNP: Invalid input(s): "POCBNP" CBG: No results for input(s): "GLUCAP" in the last 168 hours. D-Dimer No results for input(s): "DDIMER" in the last 72 hours. Hgb A1c No results for input(s): "HGBA1C" in the last 72 hours. Lipid Profile No results for input(s): "CHOL", "HDL", "LDLCALC", "TRIG", "CHOLHDL", "LDLDIRECT" in the last 72 hours. Thyroid function studies No results for input(s): "TSH", "T4TOTAL", "T3FREE", "THYROIDAB" in the last 72 hours.  Invalid input(s): "FREET3" Anemia work up No results for input(s): "VITAMINB12", "FOLATE", "FERRITIN", "TIBC", "IRON", "RETICCTPCT" in the last 72 hours. Urinalysis    Component Value Date/Time   COLORURINE AMBER (A) 09/22/2021 1256   APPEARANCEUR HAZY (A) 09/22/2021 1256   LABSPEC 1.028 09/22/2021 1256   PHURINE 5.0 09/22/2021 1256   GLUCOSEU NEGATIVE 09/22/2021 1256   HGBUR NEGATIVE 09/22/2021 1256   BILIRUBINUR NEGATIVE 09/22/2021 1256   KETONESUR 20 (A) 09/22/2021 1256   PROTEINUR NEGATIVE 09/22/2021 1256   NITRITE NEGATIVE 09/22/2021 1256   LEUKOCYTESUR MODERATE (A) 09/22/2021 1256   Sepsis Labs Recent Labs  Lab 09/22/21 1127 09/23/21 0511  WBC 8.6 9.8   Microbiology No results found for this or any previous visit (from the past 240 hour(s)).   Time coordinating discharge:  I have spent 35 minutes face to face with the patient and on the ward discussing the patients care, assessment, plan and disposition with other care givers. >50% of the time was devoted counseling the patient about the risks and benefits of treatment/Discharge disposition and coordinating care.   SIGNED:   Damita Lack, MD  [facility] 09/23/2021, 1:10 PM   If 7PM-7AM, please contact night-coverage

## 2021-09-26 ENCOUNTER — Inpatient Hospital Stay (HOSPITAL_COMMUNITY)
Admission: EM | Admit: 2021-09-26 | Discharge: 2021-10-06 | DRG: 054 | Disposition: A | Discharge status: Skilled Nursing Facility | Payer: BC Managed Care – PPO | Attending: Family Medicine | Admitting: Family Medicine

## 2021-09-26 ENCOUNTER — Encounter (HOSPITAL_COMMUNITY): Payer: Self-pay

## 2021-09-26 ENCOUNTER — Other Ambulatory Visit: Payer: Self-pay

## 2021-09-26 DIAGNOSIS — R2681 Unsteadiness on feet: Secondary | ICD-10-CM

## 2021-09-26 DIAGNOSIS — Z66 Do not resuscitate: Secondary | ICD-10-CM | POA: Diagnosis not present

## 2021-09-26 DIAGNOSIS — Z825 Family history of asthma and other chronic lower respiratory diseases: Secondary | ICD-10-CM

## 2021-09-26 DIAGNOSIS — Z6832 Body mass index (BMI) 32.0-32.9, adult: Secondary | ICD-10-CM

## 2021-09-26 DIAGNOSIS — I159 Secondary hypertension, unspecified: Secondary | ICD-10-CM

## 2021-09-26 DIAGNOSIS — Z818 Family history of other mental and behavioral disorders: Secondary | ICD-10-CM

## 2021-09-26 DIAGNOSIS — Z811 Family history of alcohol abuse and dependence: Secondary | ICD-10-CM

## 2021-09-26 DIAGNOSIS — G936 Cerebral edema: Secondary | ICD-10-CM | POA: Diagnosis present

## 2021-09-26 DIAGNOSIS — Z923 Personal history of irradiation: Secondary | ICD-10-CM

## 2021-09-26 DIAGNOSIS — Z880 Allergy status to penicillin: Secondary | ICD-10-CM

## 2021-09-26 DIAGNOSIS — R627 Adult failure to thrive: Secondary | ICD-10-CM

## 2021-09-26 DIAGNOSIS — E669 Obesity, unspecified: Secondary | ICD-10-CM

## 2021-09-26 DIAGNOSIS — R531 Weakness: Secondary | ICD-10-CM | POA: Diagnosis not present

## 2021-09-26 DIAGNOSIS — E876 Hypokalemia: Secondary | ICD-10-CM | POA: Diagnosis not present

## 2021-09-26 DIAGNOSIS — Z79899 Other long term (current) drug therapy: Secondary | ICD-10-CM

## 2021-09-26 DIAGNOSIS — M1990 Unspecified osteoarthritis, unspecified site: Secondary | ICD-10-CM | POA: Diagnosis present

## 2021-09-26 DIAGNOSIS — E785 Hyperlipidemia, unspecified: Secondary | ICD-10-CM

## 2021-09-26 DIAGNOSIS — Z888 Allergy status to other drugs, medicaments and biological substances status: Secondary | ICD-10-CM

## 2021-09-26 DIAGNOSIS — G9341 Metabolic encephalopathy: Secondary | ICD-10-CM | POA: Diagnosis present

## 2021-09-26 DIAGNOSIS — G40909 Epilepsy, unspecified, not intractable, without status epilepticus: Secondary | ICD-10-CM

## 2021-09-26 DIAGNOSIS — D496 Neoplasm of unspecified behavior of brain: Secondary | ICD-10-CM | POA: Diagnosis present

## 2021-09-26 DIAGNOSIS — C719 Malignant neoplasm of brain, unspecified: Principal | ICD-10-CM

## 2021-09-26 DIAGNOSIS — I447 Left bundle-branch block, unspecified: Secondary | ICD-10-CM | POA: Diagnosis present

## 2021-09-26 DIAGNOSIS — I10 Essential (primary) hypertension: Secondary | ICD-10-CM | POA: Diagnosis present

## 2021-09-26 LAB — CBC
HCT: 41.2 % (ref 36.0–46.0)
Hemoglobin: 14.5 g/dL (ref 12.0–15.0)
MCH: 31.7 pg (ref 26.0–34.0)
MCHC: 35.2 g/dL (ref 30.0–36.0)
MCV: 90 fL (ref 80.0–100.0)
Platelets: 279 10*3/uL (ref 150–400)
RBC: 4.58 MIL/uL (ref 3.87–5.11)
RDW: 13.1 % (ref 11.5–15.5)
WBC: 8.6 10*3/uL (ref 4.0–10.5)
nRBC: 0 % (ref 0.0–0.2)

## 2021-09-26 LAB — BASIC METABOLIC PANEL
Anion gap: 10 (ref 5–15)
BUN: 11 mg/dL (ref 8–23)
CO2: 23 mmol/L (ref 22–32)
Calcium: 9.4 mg/dL (ref 8.9–10.3)
Chloride: 107 mmol/L (ref 98–111)
Creatinine, Ser: 0.65 mg/dL (ref 0.44–1.00)
GFR, Estimated: >60 mL/min (ref >60)
Glucose, Bld: 127 mg/dL — ABNORMAL HIGH (ref 70–99)
Potassium: 3.9 mmol/L (ref 3.5–5.1)
Sodium: 140 mmol/L (ref 135–145)

## 2021-09-26 LAB — CBG MONITORING, ED: Glucose-Capillary: 125 mg/dL — ABNORMAL HIGH (ref 70–99)

## 2021-09-26 MED ORDER — SODIUM CHLORIDE 0.9 % IV BOLUS
1000.0000 mL | Freq: Once | INTRAVENOUS | Status: AC
  Administered 2021-09-26: 1000 mL via INTRAVENOUS

## 2021-09-26 MED ORDER — VITAMIN D 25 MCG (1000 UNIT) PO TABS
1000.0000 [IU] | ORAL_TABLET | Freq: Every day | ORAL | Status: DC
  Administered 2021-09-27 – 2021-10-06 (×10): 1000 [IU] via ORAL
  Filled 2021-09-26 (×10): qty 1

## 2021-09-26 MED ORDER — DEXAMETHASONE SODIUM PHOSPHATE 10 MG/ML IJ SOLN
10.0000 mg | Freq: Once | INTRAMUSCULAR | Status: AC
  Administered 2021-09-26: 10 mg via INTRAVENOUS
  Filled 2021-09-26: qty 1

## 2021-09-26 MED ORDER — SERTRALINE HCL 100 MG PO TABS
100.0000 mg | ORAL_TABLET | Freq: Every day | ORAL | Status: DC
  Administered 2021-09-27 – 2021-10-05 (×9): 100 mg via ORAL
  Filled 2021-09-26 (×9): qty 1

## 2021-09-26 MED ORDER — ATORVASTATIN CALCIUM 20 MG PO TABS
20.0000 mg | ORAL_TABLET | Freq: Every day | ORAL | Status: DC
  Administered 2021-09-27 – 2021-10-06 (×10): 20 mg via ORAL
  Filled 2021-09-26 (×10): qty 1

## 2021-09-26 MED ORDER — LORATADINE 10 MG PO TABS
10.0000 mg | ORAL_TABLET | Freq: Every day | ORAL | Status: DC
  Administered 2021-09-27 – 2021-10-06 (×10): 10 mg via ORAL
  Filled 2021-09-26 (×10): qty 1

## 2021-09-26 MED ORDER — LORAZEPAM 1 MG PO TABS
1.0000 mg | ORAL_TABLET | Freq: Three times a day (TID) | ORAL | Status: DC | PRN
  Administered 2021-09-27: 1 mg via ORAL
  Filled 2021-09-26: qty 1

## 2021-09-26 MED ORDER — DEXAMETHASONE 2 MG PO TABS
2.0000 mg | ORAL_TABLET | Freq: Every day | ORAL | Status: DC
  Administered 2021-09-27: 2 mg via ORAL
  Filled 2021-09-26: qty 1

## 2021-09-26 MED ORDER — LEVETIRACETAM 500 MG PO TABS
750.0000 mg | ORAL_TABLET | Freq: Two times a day (BID) | ORAL | Status: DC
  Administered 2021-09-27 – 2021-10-06 (×20): 750 mg via ORAL
  Filled 2021-09-26 (×20): qty 1

## 2021-09-26 NOTE — ED Notes (Signed)
Pt requires wheelchair to bathroom. Pt needs assist w/ standing and transferring.   Unable to obtain urine sample, d/t pt putting toilet paper in tophat. RN aware.

## 2021-09-26 NOTE — Assessment & Plan Note (Signed)
Continue home [place]. No recent seizures.

## 2021-09-26 NOTE — ED Provider Notes (Signed)
[facility] DEPT Provider Note   CSN: [id number] Arrival date & time: 09/26/21  1832     History  Chief Complaint  Patient presents with   Fatigue    Lindsey Daniels is a 66 y.o. female.  Patient with hx worsening brain tumor, recently admitted, presents indicating since d/c a few days ago, continues to feel generally weak, fatigued, trouble getting around, and decreased appetite/poor po intake. Symptoms gradual onset, moderate, persistent. Indicates compliant w med therapy. Indicates has been told further resection or tx will not be helpful. Denies prior palliative/hospice care eval.  No fever or chills. No worsening or severe headache. No chest pain or sob. No abd pain or vomiting.   The history is provided by the patient, medical records, a relative and a friend. The history is limited by the condition of the patient.       Home Medications Prior to Admission medications   Medication Sig Start Date End Date Taking? Authorizing Provider  atorvastatin (LIPITOR) 20 MG tablet Take 1 tablet (20 mg total) by mouth daily. 07/25/17   Harrison Mons, PA  cetirizine (ZYRTEC) 10 MG tablet Take 10 mg by mouth daily.    [provider]  cholecalciferol (VITAMIN D) 1000 units tablet Take 1,000 Units by mouth daily.    [provider]  dexamethasone (DECADRON) 2 MG tablet Take 1 tablet (2 mg total) by mouth daily for 14 days. 09/24/21 10/08/21  Amin, Jeanella Flattery, MD  fluticasone (FLONASE) 50 MCG/ACT nasal spray Place 1-2 sprays into both nostrils daily. 11/06/20   Wieters, Hallie C, PA-C  levETIRAcetam (KEPPRA) 250 MG tablet Take 3 tablets (750 mg total) by mouth 2 (two) times daily. 07/16/21 10/14/21  Mercy Riding, MD  LORazepam (ATIVAN) 1 MG tablet Take 1 tablet (1 mg total) by mouth every 8 (eight) hours. Patient taking differently: Take 1 mg by mouth every 8 (eight) hours as needed for anxiety. 07/22/21   Ventura Sellers, MD  Multiple Vitamin  (MULTIVITAMIN WITH MINERALS) TABS tablet Take 1 tablet by mouth at bedtime.    [provider]  potassium chloride (KLOR-CON) 20 MEQ packet Take 40 mEq by mouth once for 1 dose. 09/23/21 09/23/21  Amin, Jeanella Flattery, MD  sertraline (ZOLOFT) 100 MG tablet Take 1 tablet (100 mg total) by mouth daily. Patient taking differently: Take 100 mg by mouth at bedtime. 06/30/17   Harrison Mons, PA  triamcinolone (NASACORT) 55 MCG/ACT AERO nasal inhaler Place 2 sprays into the nose at bedtime. In to each nostril 12/20/18   Rozetta Nunnery, MD  valACYclovir (VALTREX) 500 MG tablet Take 1 tablet (500 mg total) by mouth 2 (two) times daily. Takes only if needed for breakouts Patient taking differently: Take 500 mg by mouth 2 (two) times daily as needed (for breakout). 06/30/17   Harrison Mons, PA      Allergies    Rifapentine, Amoxicillin, and Clavulanic acid    Review of Systems   Review of Systems  Constitutional:  Negative for chills and fever.  HENT:  Negative for sore throat.   Eyes:  Negative for visual disturbance.  Respiratory:  Negative for shortness of breath.   Cardiovascular:  Negative for chest pain.  Gastrointestinal:  Negative for abdominal pain.  Genitourinary:  Negative for flank pain.  Musculoskeletal:  Negative for back pain and neck pain.  Skin:  Negative for rash.  Neurological:  Negative for light-headedness.  Hematological:  Does not bruise/bleed easily.  Physical Exam Updated Vital Signs BP (!) 184/105   Pulse 85   Temp 98.7 F (37.1 C) (Oral)   Resp 18   Ht 1.575 m ('5\' 2"'$ )   Wt 79.4 kg   SpO2 96%   BMI 32.01 kg/m  Physical Exam Vitals and nursing note reviewed.  Constitutional:      Appearance: Normal appearance. She is well-developed.  HENT:     Head: Atraumatic.     Nose: Nose normal.     Mouth/Throat:     Mouth: Mucous membranes are moist.  Eyes:     General: No scleral icterus.    Conjunctiva/sclera: Conjunctivae normal.     Pupils:  Pupils are equal, round, and reactive to light.  Neck:     Vascular: No carotid bruit.     Trachea: No tracheal deviation.  Cardiovascular:     Rate and Rhythm: Normal rate and regular rhythm.     Pulses: Normal pulses.     Heart sounds: Normal heart sounds. No murmur heard.    No friction rub. No gallop.  Pulmonary:     Effort: Pulmonary effort is normal. No respiratory distress.     Breath sounds: Normal breath sounds.  Abdominal:     General: Bowel sounds are normal. There is no distension.     Palpations: Abdomen is soft.     Tenderness: There is no abdominal tenderness. There is no guarding.  Genitourinary:    Comments: No cva tenderness.  Musculoskeletal:        General: No swelling or tenderness.     Cervical back: Normal range of motion and neck supple. No rigidity. No muscular tenderness.  Skin:    General: Skin is warm and dry.     Findings: No rash.  Neurological:     Mental Status: She is alert.     Comments: Alert, speech normal.  Generally weak/no new/focal weakness appreciated. Pt unable to ambulate on own, requests two person assist.   Psychiatric:        Mood and Affect: Mood normal.     ED Results / Procedures / Treatments   Labs (all labs ordered are listed, but only abnormal results are displayed) Results for orders placed or performed during the hospital encounter of 96/22/29  Basic metabolic panel  Result Value Ref Range   Sodium 140 135 - 145 mmol/L   Potassium 3.9 3.5 - 5.1 mmol/L   Chloride 107 98 - 111 mmol/L   CO2 23 22 - 32 mmol/L   Glucose, Bld 127 (H) 70 - 99 mg/dL   BUN 11 8 - 23 mg/dL   Creatinine, Ser 0.65 0.44 - 1.00 mg/dL   Calcium 9.4 8.9 - 10.3 mg/dL   GFR, Estimated >60 >60 mL/min   Anion gap 10 5 - 15  CBC  Result Value Ref Range   WBC 8.6 4.0 - 10.5 K/uL   RBC 4.58 3.87 - 5.11 MIL/uL   Hemoglobin 14.5 12.0 - 15.0 g/dL   HCT 41.2 36.0 - 46.0 %   MCV 90.0 80.0 - 100.0 fL   MCH 31.7 26.0 - 34.0 pg   MCHC 35.2 30.0 - 36.0  g/dL   RDW 13.1 11.5 - 15.5 %   Platelets 279 150 - 400 K/uL   nRBC 0.0 0.0 - 0.2 %  CBG monitoring, ED  Result Value Ref Range   Glucose-Capillary 125 (H) 70 - 99 mg/dL   CT Head W or Wo Contrast  Result Date: 09/22/2021 CLINICAL DATA:  Mental status change, unknown cause EXAM: CT HEAD WITHOUT AND WITH CONTRAST TECHNIQUE: Contiguous axial images were obtained from the base of the skull through the vertex without and with intravenous contrast. RADIATION DOSE REDUCTION: This exam was performed according to the departmental dose-optimization program which includes automated exposure control, adjustment of the mA and/or kV according to patient size and/or use of iterative reconstruction technique. CONTRAST:  30m OMNIPAQUE IOHEXOL 300 MG/ML  SOLN COMPARISON:  MRI head 08/29/2021. FINDINGS: Brain: When comparing across modalities to MRI head from July 30, 2021, suspected progression of known tumor with increased edema and mass effect with 11 mm of leftward midline shift at the foramen of Monro (previously 4 mm when remeasured). An MRI with contrast could further characterize if clinically warranted. Mild rounding of the left temporal horn and left lateral ventricle is new from the prior and suspicious for ventricular entrapment. No evidence of superimposed acute large vascular territory infarct or acute hemorrhage. Vascular: No hyperdense vessel identified. Skull: Prior right pterional craniotomy.  No acute fracture. Sinuses/Orbits: Clear sinuses.  No acute orbital findings. Other: No mastoid effusions. IMPRESSION: 1. When comparing across modalities to MRI head from August 29, 2021, suspected progression of known tumor with increased edema and mass effect with 11 mm of leftward midline shift at the foramen of Monro (previously 4 mm when remeasured). An MRI with contrast could further characterize if clinically warranted. 2. Mild rounding of the left temporal horn and left lateral ventricle is new from the prior  and suspicious for ventricular entrapment. Electronically Signed   By: FMargaretha SheffieldM.D.   On: 09/22/2021 13:38   DG Chest Port 1 View  Result Date: 09/22/2021 CLINICAL DATA:  Altered mental status EXAM: PORTABLE CHEST 1 VIEW COMPARISON:  08/26/2014 FINDINGS: Cardiac and mediastinal contours are within normal limits. No focal pulmonary opacity. No pleural effusion or pneumothorax. No acute osseous abnormality. IMPRESSION: No acute cardiopulmonary process. Electronically Signed   By: AMerilyn BabaM.D.   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 Original tumor resection in 2015. Progression and Re-resection in 2022, pathology unchanged. Status post IMRT in January and February this year. Restaging. EXAM: MRI HEAD WITHOUT AND WITH CONTRAST TECHNIQUE: Multiplanar, multiecho pulse sequences of the brain and surrounding structures were obtained without and with intravenous contrast. CONTRAST:  767mGADAVIST GADOBUTROL 1 MMOL/ML IV SOLN COMPARISON:  06/27/2021 and earlier. FINDINGS: Brain: Ongoing highly heterogeneous enhancement in the right hemisphere at the temporal lobe resection site and tracking into the left deep white matter capsules. However, enhancement character is less solid when compared to 05/30/2021 such as in the posteroinferior margin (series 16, image 71 now versus series 16, image 60 previously), anterior margin (image 85), and superior margin (series 16, image 97). However, craniocaudal size of abnormal enhancement has progressed since April from about 34 mm at that time to about 40 mm now (compare series 17, image 13 now to series 19, image [id number] At the same time, the regional T2 and FLAIR hyperintensity are only mildly progressed (right superior frontal gyrus white matter series 15, image 9, middle frontal gyrus image 17). And regional mass effect is unchanged. Stable midline  shift of 5 mm to the left, mass effect on the right lateral ventricle. Slight progression of hemosiderin along the superior resection margin. No superimposed restricted diffusion suggestive of acute infarction. No ventriculomegaly, or acute intracranial hemorrhage. Cervicomedullary junction and  pituitary are within normal limits. Basilar cisterns remain normal. No new areas of abnormal intracranial enhancement. Stable mild craniotomy related dural thickening. Vascular: Major intracranial vascular flow voids are stable. The major dural venous sinuses are enhancing and appear to be patent. Skull and upper cervical spine: Visualized bone marrow signal is within normal limits. Negative visible cervical spine, spinal cord. Sinuses/Orbits: Stable, negative. Other: Stable mild right mastoid effusion. Postoperative changes to the right scalp. IMPRESSION: 1. Evolution of the treated Right hemisphere lesion since April with mild increase in enhancing lesion size (coronal series 17, image 13), although areas of less solid enhancement now, and only modest increase in regional T2/FLAIR hyperintensity. Unchanged regional mass effect. Favor treatment effect over genuine tumor progression at this time, but recommend continued MRI surveillance. 2. No new intracranial abnormality Electronically Signed   By: Genevie Ann M.D.   On: 08/31/2021 13:40      EKG EKG Interpretation  Date/Time:  Friday September 26 2021 18:47:33 EDT Ventricular Rate:  83 PR Interval:  128 QRS Duration: 132 QT Interval:  413 QTC Calculation: 486 R Axis:   10 Text Interpretation: Sinus rhythm Left bundle branch block Non-specific ST-t changes Confirmed by Lajean Saver [phone]) on 09/26/2021 7:21:09 PM  Radiology Ct head 09/2021, progression tumor/edema  Procedures Procedures    Medications Ordered in ED Medications  sodium chloride 0.9 % bolus 1,000 mL (has no administration in time range)  dexamethasone (DECADRON) injection 10 mg (has no  administration in time range)    ED Course/ Medical Decision Making/ A&P                           Medical Decision Making Problems Addressed: Brain tumor [facility]): chronic illness or injury with exacerbation, progression, or side effects of treatment that poses a threat to life or bodily functions Failure to thrive in adult: acute illness or injury with systemic symptoms that poses a threat to life or bodily functions Gait instability: acute illness or injury Generalized weakness: acute illness or injury with systemic symptoms that poses a threat to life or bodily functions Uncontrolled hypertension: chronic illness or injury with exacerbation, progression, or side effects of treatment that poses a threat to life or bodily functions  Amount and/or Complexity of Data Reviewed Independent Historian: friend    Details: family, hx External Data Reviewed: radiology and notes. Labs: ordered. Decision-making details documented in ED Course. Radiology: independent interpretation performed. Decision-making details documented in ED Course. ECG/medicine tests: ordered and independent interpretation performed. Decision-making details documented in ED Course. Discussion of management or test interpretation with external provider(s): hospitalists  Risk Prescription drug management. Decision regarding hospitalization.  Iv ns. Continuous pulse ox and cardiac monitoring. Labs ordered/sent.   Ns bolus. Decadron iv. Palliative medicine consulted.   Reviewed nursing notes and prior charts for additional history. External reports reviewed. Additional history from: family/friend.   Cardiac monitor: sinus rhythm, rate  Labs reviewed/interpreted by me - chem normal. Wbc/hgb normal.   Recent CT reviewed/interpreted by me - increased/worsening brain tumor.   Family questions whether possible not compliant w home meds/tx.   Patient lives independently and is not able to stand/walk. Recent ct w worsening  tumor/edema. Decadron in ED. Feel pt will require admission, PT - during admission may need to consult onc re: plan of care, and whether to transition to rehab/SNF vs hospice/palliative setting.  Hospitalists consulted for admission.          Final Clinical  Impression(s) / ED Diagnoses Final diagnoses:  None    Rx / DC Orders ED Discharge Orders     None         Lajean Saver, MD 09/26/21 2132

## 2021-09-26 NOTE — ED Triage Notes (Signed)
Pt BIB EMS for increasing fatigue after radiation treatment a few days ago. Pt has hx of brain tumor. Per pt, she has increasingly hard time ambulating and generalized weakness.

## 2021-09-26 NOTE — Assessment & Plan Note (Signed)
Continue atorvastatin

## 2021-09-26 NOTE — H&P (Signed)
History and Physical    Patient: Lindsey Daniels UEA:[id number] DOB: 10-07-1955 DOA: 09/26/2021 DOS: the patient was seen and examined on 09/27/2021 PCP: Harrison Mons, PA  Patient coming from: Home  Chief Complaint:  Chief Complaint  Patient presents with   Fatigue   HPI: Lindsey Daniels is a 66 y.o. 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  She was just discharged on 09/23/21 with CT head showing progression of her tumor with edema and mass affect. Oncology Dr. Mickeal Skinner consulted recommended IV decadron load and transitioning to '2mg'$  daily with follow up. Since returning home she has required walker for ambulation. Has been sleeping more. Friend checked on her today since she missed her manicure appointment and decided to call EMS.  She reports compliance with her medications especially her decadron.   In the ED, she was afebrile, hypertensive up to SBP 170 on room air. No leukocytosis, or anemia. No electrolyte abnormalities. On my review, EKG shows LBBB but no specific ST or T wave changes. No repeat head imaging was obtained.  Hospitalist called for admission.  Review of Systems: As mentioned in the history of present illness. All other systems reviewed and are negative. Past Medical History:  Diagnosis Date   Allergy    Anal fissure    Anxiety    Cataract    Depression    Elevated LFTs    Hyperglycemia    Hyperlipidemia    Hypertension    Osteoarthritis    Pars defect of lumbar spine    Seizures (HCC)    Spondylolisthesis    lumbar   Past Surgical History:  Procedure Laterality Date   cataract     CRANIOTOMY N/A 10/02/2013   Procedure: 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;  Surgeon: Hosie Spangle, MD;   Location: [facility];  Service: Neurosurgery;  Laterality: N/A;  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   CRANIOTOMY Right 11/29/2020   Procedure: Right Parietal craniotomy for tumor resection;  Surgeon: Ashok Pall, MD;  Location: [facility];  Service: Neurosurgery;  Laterality: Right;   HEEL SPUR SURGERY     Social History:  reports that she has never smoked. She has never used smokeless tobacco. She reports current alcohol use. She reports that she does not use drugs.  Allergies  Allergen Reactions   Rifapentine Other (See Comments)    Flu-like symptoms   Amoxicillin Rash   Clavulanic Acid Rash    Family History  Problem Relation Age of Onset   Mental illness Father        depression   COPD Father    Alcohol abuse Brother    Breast cancer Neg Hx     Prior to Admission medications   Medication Sig Start Date End Date Taking? Authorizing Provider  atorvastatin (LIPITOR) 20 MG tablet Take 1 tablet (20 mg total) by mouth daily. 07/25/17  Yes Jeffery, Domingo Mend, PA  cetirizine (ZYRTEC) 10 MG tablet Take 10 mg by mouth daily.   Yes [provider]  cholecalciferol (VITAMIN D) 1000 units tablet Take 1,000 Units by mouth daily.   Yes [provider]  dexamethasone (DECADRON) 1 MG tablet Take 2 mg by mouth daily. 09/23/21  Yes [provider]  fluticasone (FLONASE) 50 MCG/ACT nasal spray Place 1-2 sprays into both nostrils  daily. 11/06/20  Yes Wieters, Hallie C, PA-C  levETIRAcetam (KEPPRA) 250 MG tablet Take 3 tablets (750 mg total) by mouth 2 (two) times daily. 07/16/21 10/14/21 Yes Mercy Riding, MD  LORazepam (ATIVAN) 1 MG tablet Take 1 tablet (1 mg total) by mouth every 8 (eight) hours. Patient taking differently: Take 1 mg by mouth every 8 (eight) hours as needed for anxiety. 07/22/21  Yes Vaslow, Acey Lav, MD  Multiple Vitamin (MULTIVITAMIN WITH MINERALS) TABS tablet Take 1 tablet by mouth at bedtime.   Yes [provider]  sertraline (ZOLOFT) 100  MG tablet Take 1 tablet (100 mg total) by mouth daily. Patient taking differently: Take 100 mg by mouth at bedtime. 06/30/17  Yes Jeffery, Chelle, PA  triamcinolone (NASACORT) 55 MCG/ACT AERO nasal inhaler Place 2 sprays into the nose at bedtime. In to each nostril 12/20/18  Yes Rozetta Nunnery, MD  valACYclovir (VALTREX) 500 MG tablet Take 1 tablet (500 mg total) by mouth 2 (two) times daily. Takes only if needed for breakouts Patient taking differently: Take 500 mg by mouth 2 (two) times daily as needed (for breakout). 06/30/17  Yes Jeffery, Chelle, PA  dexamethasone (DECADRON) 2 MG tablet Take 1 tablet (2 mg total) by mouth daily for 14 days. Patient not taking: Reported on 09/26/2021 09/24/21 10/08/21  Damita Lack, MD  potassium chloride (KLOR-CON) 20 MEQ packet Take 40 mEq by mouth once for 1 dose. Patient not taking: Reported on 09/26/2021 09/23/21 09/23/21  Damita Lack, MD    Physical Exam: Vitals:   09/26/21 2228 09/26/21 2309 09/27/21 0000 09/27/21 0100  BP:  (!) 157/90    Pulse:  77    Resp:  '18 16 19  '$ Temp: 98 F (36.7 C) 98.7 F (37.1 C)    TempSrc: Oral Oral    SpO2:  96%    Weight:      Height:       Constitutional: NAD, calm, comfortable, obese female laying in bed initially asleep.  She easily awoke to voice.  Appears to have some slurring of speech and has tangential thoughts. Eyes: PERRL, lids and conjunctivae normal ENMT: Mucous membranes are moist.  Neck: normal, supple Respiratory: clear to auscultation bilaterally, no wheezing, no crackles. Normal respiratory effort. No accessory muscle use.  Cardiovascular: Regular rate and rhythm, no murmurs / rubs / gallops. No extremity edema. 2+ pedal pulses. No carotid bruits.  Abdomen: no tenderness, Bowel sounds positive.  Musculoskeletal: no clubbing / cyanosis. No joint deformity upper and lower extremities. Good ROM, no contractures. Normal muscle tone.  Skin: no rashes, lesions, ulcers.  Neurologic: CN 2-12  grossly intact.  No facial asymmetry.  Equal shoulder shrug.  No pronator drift with upper extremity.  Strength 5/5 in all 4.  Appears to have speech delay. Psychiatric:  Alert and oriented x 3. Normal mood.  Has tangential thoughts. 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 Resumed on Decadron. No repeat imaging done in ED.  -She is alert and oriented x 4. Will consult with oncology tomorrow to see if there is utility to getting MRI brain. Follows with Dr. Mickeal Skinner oncology -Given loading dose of '10mg'$  Decadron in ED. Will continue home oral '2mg'$  tomorrow -need oncology consult  -palliative care also consulted to discuss  GOC with worsening progression of disease and worsening functional status.  Obesity (BMI 30-39.9) noted  HTN (hypertension) Not on antihypertensives. PRN IV labetalol for SBP >170.   Seizure disorder ([place]) Continue home Keppra. No recent seizures.  Hyperlipidemia Continue atorvastatin      Advance Care Planning:   Code Status: Full Code Full  Consults: none  Family Communication: none at bedside  Severity of Illness: The appropriate patient status for this patient is OBSERVATION. Observation status is judged to be reasonable and necessary in order to provide the required intensity of service to ensure the patient's safety. The patient's presenting symptoms, physical exam findings, and initial radiographic and laboratory data in the context of their medical condition is felt to place them at decreased risk for further clinical deterioration. Furthermore, it is anticipated that the patient will be medically stable for discharge from the hospital within 2 midnights of admission.   Author: Orene Desanctis, DO 09/27/2021 1:38  AM  For on call review [email or web address].

## 2021-09-26 NOTE — Assessment & Plan Note (Signed)
-   s/p temporal craniotomy, resection in 2015 with progression of disease s/p second craniotomy with sub-total resection and radiation -09/23/21  CT head showing progression of her tumor with edema and mass affect. Resumed on Decadron. No repeat imaging done in ED.  -She is alert and oriented x 4. Will consult with oncology tomorrow to see if there is utility to getting MRI brain. Follows with Dr. Mickeal Skinner oncology -Given loading dose of '10mg'$  Decadron in ED. Will continue home oral '2mg'$  tomorrow -need oncology consult  -palliative care also consulted to discuss [facility] with worsening progression of disease and worsening functional status.

## 2021-09-27 ENCOUNTER — Observation Stay (HOSPITAL_COMMUNITY): Payer: BC Managed Care – PPO

## 2021-09-27 DIAGNOSIS — C719 Malignant neoplasm of brain, unspecified: Secondary | ICD-10-CM | POA: Diagnosis present

## 2021-09-27 DIAGNOSIS — M1990 Unspecified osteoarthritis, unspecified site: Secondary | ICD-10-CM | POA: Diagnosis present

## 2021-09-27 DIAGNOSIS — Z818 Family history of other mental and behavioral disorders: Secondary | ICD-10-CM | POA: Diagnosis not present

## 2021-09-27 DIAGNOSIS — Z6832 Body mass index (BMI) 32.0-32.9, adult: Secondary | ICD-10-CM | POA: Diagnosis not present

## 2021-09-27 DIAGNOSIS — R531 Weakness: Secondary | ICD-10-CM | POA: Diagnosis present

## 2021-09-27 DIAGNOSIS — E785 Hyperlipidemia, unspecified: Secondary | ICD-10-CM | POA: Diagnosis present

## 2021-09-27 DIAGNOSIS — Z825 Family history of asthma and other chronic lower respiratory diseases: Secondary | ICD-10-CM | POA: Diagnosis not present

## 2021-09-27 DIAGNOSIS — I10 Essential (primary) hypertension: Secondary | ICD-10-CM | POA: Diagnosis present

## 2021-09-27 DIAGNOSIS — D496 Neoplasm of unspecified behavior of brain: Secondary | ICD-10-CM | POA: Diagnosis not present

## 2021-09-27 DIAGNOSIS — Z79899 Other long term (current) drug therapy: Secondary | ICD-10-CM | POA: Diagnosis not present

## 2021-09-27 DIAGNOSIS — R627 Adult failure to thrive: Secondary | ICD-10-CM | POA: Diagnosis not present

## 2021-09-27 DIAGNOSIS — E876 Hypokalemia: Secondary | ICD-10-CM | POA: Diagnosis not present

## 2021-09-27 DIAGNOSIS — Z923 Personal history of irradiation: Secondary | ICD-10-CM | POA: Diagnosis not present

## 2021-09-27 DIAGNOSIS — R2681 Unsteadiness on feet: Secondary | ICD-10-CM | POA: Diagnosis not present

## 2021-09-27 DIAGNOSIS — E669 Obesity, unspecified: Secondary | ICD-10-CM

## 2021-09-27 DIAGNOSIS — G40909 Epilepsy, unspecified, not intractable, without status epilepticus: Secondary | ICD-10-CM | POA: Diagnosis present

## 2021-09-27 DIAGNOSIS — Z811 Family history of alcohol abuse and dependence: Secondary | ICD-10-CM | POA: Diagnosis not present

## 2021-09-27 DIAGNOSIS — Z66 Do not resuscitate: Secondary | ICD-10-CM | POA: Diagnosis not present

## 2021-09-27 DIAGNOSIS — I447 Left bundle-branch block, unspecified: Secondary | ICD-10-CM | POA: Diagnosis present

## 2021-09-27 DIAGNOSIS — G936 Cerebral edema: Secondary | ICD-10-CM | POA: Diagnosis present

## 2021-09-27 DIAGNOSIS — Z888 Allergy status to other drugs, medicaments and biological substances status: Secondary | ICD-10-CM | POA: Diagnosis not present

## 2021-09-27 DIAGNOSIS — G9341 Metabolic encephalopathy: Secondary | ICD-10-CM | POA: Diagnosis present

## 2021-09-27 DIAGNOSIS — Z880 Allergy status to penicillin: Secondary | ICD-10-CM | POA: Diagnosis not present

## 2021-09-27 MED ORDER — AMLODIPINE BESYLATE 5 MG PO TABS
5.0000 mg | ORAL_TABLET | Freq: Every day | ORAL | Status: DC
  Administered 2021-09-27 – 2021-09-29 (×3): 5 mg via ORAL
  Filled 2021-09-27 (×3): qty 1

## 2021-09-27 MED ORDER — DEXAMETHASONE SODIUM PHOSPHATE 4 MG/ML IJ SOLN
4.0000 mg | Freq: Two times a day (BID) | INTRAMUSCULAR | Status: DC
  Administered 2021-09-27 – 2021-10-03 (×12): 4 mg via INTRAVENOUS
  Filled 2021-09-27 (×13): qty 1

## 2021-09-27 MED ORDER — ACETAMINOPHEN 325 MG PO TABS
650.0000 mg | ORAL_TABLET | Freq: Four times a day (QID) | ORAL | Status: DC | PRN
  Administered 2021-09-28: 650 mg via ORAL
  Filled 2021-09-27: qty 2

## 2021-09-27 MED ORDER — LABETALOL HCL 5 MG/ML IV SOLN: 5.0000 mg | INTRAVENOUS | Status: DC | PRN

## 2021-09-27 MED ORDER — PANTOPRAZOLE SODIUM 40 MG PO TBEC
40.0000 mg | DELAYED_RELEASE_TABLET | Freq: Two times a day (BID) | ORAL | Status: DC
Start: 2021-09-27 — End: 2021-10-06
  Administered 2021-09-27 – 2021-10-06 (×19): 40 mg via ORAL
  Filled 2021-09-27 (×19): qty 1

## 2021-09-27 MED ORDER — DEXAMETHASONE 2 MG PO TABS
2.0000 mg | ORAL_TABLET | Freq: Once | ORAL | Status: AC
  Administered 2021-09-27: 2 mg via ORAL
  Filled 2021-09-27: qty 1

## 2021-09-27 MED ORDER — HYDROCODONE-ACETAMINOPHEN 5-325 MG PO TABS: 1.0000 | ORAL_TABLET | Freq: Four times a day (QID) | ORAL | Status: DC | PRN

## 2021-09-27 MED ORDER — DEXAMETHASONE 4 MG PO TABS
4.0000 mg | ORAL_TABLET | Freq: Two times a day (BID) | ORAL | Status: DC
Start: 2021-09-27 — End: 2021-09-27

## 2021-09-27 MED ORDER — ORAL CARE MOUTH RINSE: 15.0000 mL | OROMUCOSAL | Status: DC | PRN

## 2021-09-27 MED ORDER — GADOBUTROL 1 MMOL/ML IV SOLN
8.0000 mL | Freq: Once | INTRAVENOUS | Status: AC | PRN
  Administered 2021-09-27: 8 mL via INTRAVENOUS

## 2021-09-27 NOTE — Plan of Care (Signed)
Discussed with patient in front of doctor plan of care, pain management and admission questions with some teach back displayed.  Educated about telemetry and pure wick.  Problem: Education: Goal: Knowledge of General Education information will improve Description: Including pain rating scale, medication(s)/side effects and non-pharmacologic comfort measures Outcome: Progressing

## 2021-09-27 NOTE — Consult Note (Signed)
Consultation Note Date: 09/27/2021   Patient Name: Lindsey Daniels  DOB: 04/23/1955  MRN: [id number]  Age / Sex: 66 y.o., female  PCP: Harrison Mons, [place] Referring Physician: Elmarie Shiley, MD  Reason for Consultation: Establishing goals of care  HPI/Patient Profile: 66 y.o. 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   Patient was just recently discharged on 09/23/2021, at that time CT showed progression of her tumor with edema and mass effect.  Oncology Dr. Mickeal Skinner was consulted and recommended IV Decadron and transition to 2 mg daily.  She has been requiring a walker for ambulation since discharge.  She has also been sleeping more.     Clinical Assessment and Goals of Care: Patient has been admitted to hospital medicine service, palliative medicine consulted for ongoing goals of care discussions. He is awake alert resting in a chair.  Introduced myself and palliative care as follows: Palliative medicine is specialized medical care for people living with serious illness. It focuses on providing relief from the symptoms and stress of a serious illness. The goal is to improve quality of life for both the patient and the family. Goals of care: Broad aims of medical therapy in relation to the patient's values and preferences. Our aim is to provide medical care aimed at enabling patients to achieve the goals that matter most to them, given the circumstances of their particular medical situation and their constraints.   Goals wishes and values attempted to be explored.  Advance care planning discussions  undertaken.  Patient describes her identity as a " survivor."  Discussed about her serious illness in the context of this hospitalization.  Patient wishes to take it step-by-step and on a day-to-day basis.  She knows that she is going to have an MRI of the brain today.  See below.  NEXT OF KIN  Brother and friends Lives locally by herself.    SUMMARY OF RECOMMENDATIONS    Full Code, Full Scope for now Patient wishes to see what her MRI shows and also wants to further discuss with Dr Mickeal Skinner with regards to her serious illness.  Brother and close friends are her 68.  Thank you for the consult.   Code Status/Advance Care Planning: Full code   Symptom Management:     Palliative Prophylaxis:  Frequent Pain Assessment  Additional Recommendations (Limitations, Scope, Preferences): Full Scope Treatment  Psycho-social/Spiritual:  Desire for further Chaplaincy support:yes Additional Recommendations: Caregiving  Support/Resources  Prognosis:  Unable to determine  Discharge Planning: To Be Determined      Primary Diagnoses: Present on Admission:  Pleomorphic xanthoastrocytoma ([place])  Hyperlipidemia  HTN (hypertension)   I have reviewed the medical record, interviewed the patient and family, and examined the patient. The following aspects are pertinent.  Past Medical History:  Diagnosis Date  Allergy    Anal fissure    Anxiety    Cataract    Depression    Elevated LFTs    Hyperglycemia    Hyperlipidemia    Hypertension    Osteoarthritis    Pars defect of lumbar spine    Seizures (HCC)    Spondylolisthesis    lumbar   Social History   Socioeconomic History   Marital status: Divorced    Spouse name: separated-no papers file   Number of children: 0   Years of education: Not on file   Highest education level: Not on file  Occupational History   Occupation: RT    Employer: [employer] @ [place]: [employer] and [employer]  Tobacco Use   Smoking status: Never   Smokeless tobacco: Never  Substance and Sexual Activity   Alcohol use: Yes    Alcohol/week: 0.0 - 4.0 standard drinks of alcohol    Comment: social   Drug use: No   Sexual activity: Not Currently    Partners: Male  Other Topics Concern   Not on file  Social History Narrative   Separated from second husband.  Very contentious split.   Social Determinants of Health   Financial Resource Strain: Not on file  Food Insecurity: Not on file  Transportation Needs: Not on file  Physical Activity: Not on file  Stress: Not on file  Social Connections: Not on file   Family History  Problem Relation Age of Onset   Mental illness Father        depression   COPD Father    Alcohol abuse Brother    Breast cancer Neg Hx    Scheduled Meds:  atorvastatin  20 mg Oral Daily   cholecalciferol  1,000 Units Oral Daily   dexamethasone  4 mg Oral Q12H   levETIRAcetam  750 mg Oral BID   loratadine  10 mg Oral Daily   pantoprazole  40 mg Oral BID   sertraline  100 mg Oral QHS   Continuous Infusions: PRN Meds:.acetaminophen, HYDROcodone-acetaminophen, labetalol, LORazepam, mouth rinse Medications Prior to Admission:  Prior to Admission medications   Medication Sig Start Date End Date Taking? Authorizing Provider  atorvastatin (LIPITOR) 20 MG tablet Take 1 tablet (20 mg total) by mouth daily. 07/25/17  Yes Jeffery, Domingo Mend, PA  cetirizine (ZYRTEC) 10 MG tablet Take 10 mg by mouth daily.   Yes [provider]  cholecalciferol (VITAMIN D) 1000 units tablet Take 1,000 Units by mouth daily.   Yes [provider]  dexamethasone (DECADRON) 1 MG tablet Take 2 mg by mouth daily. 09/23/21  Yes [provider]  fluticasone (FLONASE) 50 MCG/ACT nasal spray Place 1-2 sprays into both nostrils daily. 11/06/20  Yes Wieters, Hallie C, PA-C  levETIRAcetam (KEPPRA) 250 MG tablet Take 3 tablets (750 mg total) by mouth 2 (two) times daily. 07/16/21  10/14/21 Yes Mercy Riding, MD  LORazepam (ATIVAN) 1 MG tablet Take 1 tablet (1 mg total) by mouth every 8 (eight) hours. Patient taking differently: Take 1 mg by mouth every 8 (eight) hours as needed for anxiety. 07/22/21  Yes Vaslow, Acey Lav, MD  Multiple Vitamin (MULTIVITAMIN WITH MINERALS) TABS tablet Take 1 tablet by mouth at bedtime.   Yes [provider]  sertraline (ZOLOFT) 100 MG tablet Take 1 tablet (100 mg total) by mouth daily. Patient taking differently: Take 100 mg by mouth at bedtime. 06/30/17  Yes Harrison Mons, PA  triamcinolone (  NASACORT) 55 MCG/ACT AERO nasal inhaler Place 2 sprays into the nose at bedtime. In to each nostril 12/20/18  Yes Rozetta Nunnery, MD  valACYclovir (VALTREX) 500 MG tablet Take 1 tablet (500 mg total) by mouth 2 (two) times daily. Takes only if needed for breakouts Patient taking differently: Take 500 mg by mouth 2 (two) times daily as needed (for breakout). 06/30/17  Yes Jeffery, Chelle, PA  dexamethasone (DECADRON) 2 MG tablet Take 1 tablet (2 mg total) by mouth daily for 14 days. Patient not taking: Reported on 09/26/2021 09/24/21 10/08/21  Damita Lack, MD  potassium chloride (KLOR-CON) 20 MEQ packet Take 40 mEq by mouth once for 1 dose. Patient not taking: Reported on 09/26/2021 09/23/21 09/23/21  Damita Lack, MD   Allergies  Allergen Reactions   Rifapentine Other (See Comments)    Flu-like symptoms   Amoxicillin Rash   Clavulanic Acid Rash   Review of Systems Denies headache  Physical Exam Awake alert Sitting in chair No distress Regular work of breathing S 1 S 2 Abdomen not distended  Vital Signs: BP (!) 155/106 (BP Location: Right Arm)   Pulse 90   Temp 97.9 F (36.6 C)   Resp 17   Ht '5\' 2"'$  (1.575 m)   Wt 79.4 kg   SpO2 98%   BMI 32.01 kg/m  Pain Scale: 0-10   Pain Score: 0-No pain   SpO2: SpO2: 98 % O2 Device:SpO2: 98 % O2 Flow Rate: .   IO: Intake/output summary:  Intake/Output Summary (Last 24  hours) at 09/27/2021 1356 Last data filed at 09/27/2021 0500 Gross per 24 hour  Intake 1180 ml  Output 200 ml  Net 980 ml    LBM: Last BM Date : 09/26/21 Baseline Weight: Weight: 79.4 kg Most recent weight: Weight: 79.4 kg     Palliative Assessment/Data:   PPS 50%  Time In:  1400  Time Out:  1500 Time Total:  60  Greater than 50%  of this time was spent counseling and coordinating care related to the above assessment and plan.  Signed by: Loistine Chance, MD   Please contact Palliative Medicine Team phone at [phone] for questions and concerns.  For individual provider: See Shea Evans

## 2021-09-27 NOTE — Assessment & Plan Note (Signed)
Not on antihypertensives. PRN IV labetalol for SBP >170.

## 2021-09-27 NOTE — Evaluation (Signed)
Physical Therapy Evaluation Patient Details Name: Lindsey Daniels MRN: [id number] DOB: Mar 26, 1955 Today's Date: 09/27/2021  History of Present Illness  Patient is a 66 y.o. 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 Patient was just recently discharged on 09/23/2021, at that time CT showed progression of her tumor with edema and mass effect.  Oncology Dr. Mickeal Skinner was consulted and recommended IV Decadron and transition to 2 mg daily.  She has been requiring a walker for ambulation since discharge.  She has also been sleeping more.    Clinical Impression  Lindsey Daniels is 66 y.o. female admitted with above HPI and diagnosis. Patient is currently limited by functional impairments below (see PT problem list). Patient lives alone and is mod independent with RW since last admission. Patient requires min guard/assist for transfers, and gait. She requires assist to steady balance and simple cues with extra time to process/sequence mobility. Pt demonstrates poor awareness and foresight for assistance needed at home to remain independent, Patient will benefit from continued skilled PT interventions to address impairments and progress independence with mobility, recommending HHPT with constant assist from friends/family. Pt mentioned several friends who may be able to assist her when she is ready for discharge. Acute PT will follow and progress as able.        Recommendations for follow up therapy are one component of a multi-disciplinary discharge planning process, led by the attending physician.  Recommendations may be updated based on patient status, additional functional criteria and insurance authorization.  Follow Up Recommendations Home health PT       Assistance Recommended at Discharge Frequent or constant Supervision/Assistance  Patient can return home with the following  A little help with walking and/or transfers;A little help with bathing/dressing/bathroom;Assistance with cooking/housework;Direct supervision/assist for medications management;Assist for transportation;Help with stairs or ramp for entrance    Equipment Recommendations None recommended by PT  Recommendations for Other Services       Functional Status Assessment Patient has had a recent decline in their functional status and demonstrates the ability to make significant improvements in function in a reasonable and predictable amount of time.     Precautions / Restrictions Precautions Precautions: Fall Precaution Comments: Monitor HR Restrictions Weight Bearing Restrictions: No      Mobility  Bed Mobility               General bed mobility comments: OOB in recliner    Transfers Overall transfer level: Needs assistance Equipment used: None Transfers: Sit to/from Stand, Bed to chair/wheelchair/BSC Sit to Stand: Min guard   Step pivot transfers: Min guard       General transfer comment: guarding for safety with rise from EOB and recliner. cues needed to initaite as pt has difficulty processing.    Ambulation/Gait Ambulation/Gait assistance: Min assist Gait Distance (Feet): 130 Feet Assistive device: Rolling walker (2 wheels), 1 person hand held assist Gait Pattern/deviations: Step-through pattern, Decreased stride length, Shuffle, Trunk flexed, Drifts right/left Gait velocity: decr     General Gait Details: Assist required to manage RW as pt has tendency to drfit Rt and to keep walker slightly Lt causing her to bump the walker with her feet. Pt had difficulty following VC's and multiple times required cues to stop and reset gait pattern. HHA provided for short bout of gait, pt reaching for  external support with free hand and staggering Rt  without support.  Stairs            Wheelchair Mobility    Modified Rankin (Stroke Patients Only)       Balance Overall balance assessment: Needs assistance Sitting-balance support: Feet supported Sitting balance-Leahy Scale: Good     Standing balance support: Bilateral upper extremity supported, Single extremity supported, During functional activity, Reliant on assistive device for balance Standing balance-Leahy Scale: Poor Standing balance comment: pt reliant on external support of RW or therapist                             Pertinent Vitals/Pain      Home Living Family/patient expects to be discharged to:: Private residence Living Arrangements: Alone Available Help at Discharge: Family;Friend(s);Neighbor Type of Home: House Home Access: Stairs to enter Entrance Stairs-Rails: None Entrance Stairs-Number of Steps: 1 small threshold   Home Layout: One level Home Equipment: Cane - single point;Grab bars - tub/shower Additional Comments: pt reports multiple friends and brother can help. pt mentions, Oswaldo Conroy.    Prior Function Prior Level of Function : Working/employed;Driving;Independent/Modified Independent                     Hand Dominance        Extremity/Trunk Assessment   Upper Extremity Assessment Upper Extremity Assessment: Overall WFL for tasks assessed    Lower Extremity Assessment Lower Extremity Assessment: Overall WFL for tasks assessed RLE Deficits / Details: 4-/5 hip felxion knee felx/ext and ankle dorsiflexion LLE Deficits / Details: 4-/5 hip felxion knee felx/ext and ankle dorsiflexion    Cervical / Trunk Assessment Cervical / Trunk Assessment: Normal  Communication      Cognition Arousal/Alertness: Awake/alert Behavior During Therapy: WFL for tasks assessed/performed Overall Cognitive Status: Impaired/Different from baseline Area of Impairment: Attention, Memory, Following commands,  Safety/judgement, Awareness, Problem solving                   Current Attention Level: Selective Memory: Decreased short-term memory Following Commands: Follows one step commands with increased time, Follows multi-step commands with increased time, Follows multi-step commands inconsistently, Follows one step commands inconsistently Safety/Judgement: Decreased awareness of deficits, Decreased awareness of safety Awareness: Emergent Problem Solving: Slow processing, Decreased initiation, Difficulty sequencing, Requires verbal cues General Comments: pt pleasant and oriented to situation and aware of processing deficits        General Comments      Exercises     Assessment/Plan    PT Assessment Patient needs continued PT services  PT Problem List Decreased strength;Decreased activity tolerance;Decreased balance;Decreased mobility;Decreased knowledge of use of DME;Decreased knowledge of precautions;Cardiopulmonary status limiting activity;Decreased cognition       PT Treatment Interventions DME instruction;Gait training;Stair training;Functional mobility training;Therapeutic activities;Therapeutic exercise;Balance training;Neuromuscular re-education;Patient/family education    PT Goals (Current goals can be found in the Care Plan section)  Acute Rehab PT Goals Patient Stated Goal: stay independent at home PT Goal Formulation: With patient Time For Goal Achievement: 10/07/21 Potential to Achieve Goals: Good    Frequency Min 3X/week     Co-evaluation               AM-PAC PT "6 Clicks" Mobility  Outcome Measure Help needed turning from your back to your side while in a flat bed without using bedrails?: A Little Help needed moving from lying on your back to sitting on the  side of a flat bed without using bedrails?: A Little Help needed moving to and from a bed to a chair (including a wheelchair)?: A Little Help needed standing up from a chair using your arms (e.g.,  wheelchair or bedside chair)?: A Little Help needed to walk in hospital room?: A Little Help needed climbing 3-5 steps with a railing? : A Lot 6 Click Score: 17    End of Session Equipment Utilized During Treatment: Gait belt Activity Tolerance: Patient tolerated treatment well Patient left: in chair;with chair alarm set;with call bell/phone within reach Nurse Communication: Mobility status PT Visit Diagnosis: Unsteadiness on feet (R26.81);Muscle weakness (generalized) (M62.81);Difficulty in walking, not elsewhere classified (R26.2)    Time: 1340-1411 PT Time Calculation (min) (ACUTE ONLY): 31 min   Charges:   PT Evaluation $PT Eval Low Complexity: 1 Low PT Treatments $Gait Training: 8-22 mins        Verner Mould, DPT Acute Rehabilitation Services Office [phone] Pager [phone]  09/27/21 2:46 PM

## 2021-09-27 NOTE — Progress Notes (Addendum)
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  Patient was just recently discharged on 09/23/2021, at that time CT showed progression of her tumor with edema and mass effect.  Oncology Lindsey. Mickeal Daniels was consulted and recommended IV Decadron and transition to 2 mg daily.  Lindsey Daniels has been requiring a walker for ambulation since discharge.  Lindsey Daniels has also been sleeping more.   Assessment & Plan:   Active Problems:   Pleomorphic xanthoastrocytoma (HCC)   Hyperlipidemia   Seizure disorder (HCC)   HTN (hypertension)   Obesity (BMI 30-39.9)  1-Pleomorphic Xanthoastrocytoma -Status post temporal craniotomy, resection 2015 with progression of disease status post cycle craniotomy with subtotal resection and radiation. -80/80/2023: CT head showed progression of her tumor with edema mass effect.  Decadron was resumed. -Received loading dose of 10 mg IV Decadron in the ED.  Home oral dose of 2 mg Lindsey Daniels -Discussed with Lindsey. Mickeal Daniels, plan to proceed with MRI brain with and without contrast.  He will review Lindsey Daniels MRI and case at the tumor board on Monday to determine future treatment options. -He recommended to increase Decadron to 4 mg twice daily.  Her symptoms might be explaining to rapid taper of steroid. MRI with worsening finding, discussed with Lindsey Daniels, plan to continue with IV steroids. Plan to discuss case at Tumor board.   2-Obesity; Life style modification.   Hypertension; start Norvasc   Seizure disorder Continue with Keppra.   Hyperlipidemia Continue with lipitor.     Estimated body mass  index is 32.01 kg/m as calculated from the following:   Height as of this encounter: '5\' 2"'$  (1.575 m).   Weight as of this encounter: 79.4 kg.   DVT prophylaxis: SCD Code Status: Full code Family Communication: Care discussed with patient.  Disposition Plan:  Status is: Observation The patient will require care spanning > 2 midnights and should be moved to inpatient because: management of confusion     Consultants:  Lindsey Daniels  Procedures:    Antimicrobials:    Subjective: Lindsey Daniels report headaches, not getting worse. Lindsey Daniels feels better today. Lindsey Daniels is confuse After Lindsey Daniels got home from the hospital Lindsey Daniels continued to decline, was having headache, getting more confused.  Spoke with brother he is concern about safety discharge. Patient lives alone at home. He is not sure Lindsey Daniels has been taking medications, decadron. Lindsey Daniels has been confuse.  He gave her 2 tablet 1 mg Decadron  on Friday.     Objective: Vitals:   09/26/21 2309 09/27/21 0000 09/27/21 0100 09/27/21 0330  BP: (!) 157/90   (!) 149/91  Pulse: 77   67  Resp: '18 16 19 17  '$ Temp: 98.7 F (37.1 C)   98.3 F (36.8 C)  TempSrc: Oral   Oral  SpO2: 96%   96%  Weight:      Height:        Intake/Output Summary (Last 24 hours) at 09/27/2021 0723 Last data filed at 09/27/2021 0500 Gross per 24 hour  Intake 1180 ml  Output 200 ml  Net 980 ml   Filed Weights   09/26/21 1840  Weight: 79.4 kg    Examination:  General exam: Appears calm and comfortable  Respiratory system: Clear to auscultation. Respiratory effort normal. Cardiovascular system: S1 & S2 heard, RRR. No JVD, murmurs, rubs, gallops or clicks. No pedal edema. Gastrointestinal system: Abdomen is nondistended, soft and nontender. No organomegaly or masses felt. Normal bowel sounds heard. Central nervous system: Alert and oriented. Follows command Extremities: Symmetric 5 x 5 power.    Data Reviewed: I have personally reviewed following labs and imaging  studies  CBC: Recent Labs  Lab 09/22/21 1127 09/23/21 0511 09/26/21 1900  WBC 8.6 9.8 8.6  NEUTROABS 6.4  --   --   HGB 15.5* 15.0 14.5  HCT 44.7 42.6 41.2  MCV 90.7 89.9 90.0  PLT 284 316 881   Basic Metabolic Panel: Recent Labs  Lab 09/22/21 1127 09/23/21 0511 09/26/21 1900  NA 138 138 140  K 3.3* 3.3* 3.9  CL 101 104 107  CO2 '25 23 23  '$ GLUCOSE 122* 117* 127*  BUN '17 17 11  '$ CREATININE 0.78 0.58 0.65  CALCIUM 9.2 9.6 9.4  MG 2.0  --   --    GFR: Estimated Creatinine Clearance: 66.6 mL/min (by C-G formula based on SCr of 0.65 mg/dL). Liver Function Tests: Recent Labs  Lab 09/23/21 0511  AST 16  ALT 18  ALKPHOS 68  BILITOT 1.1  PROT 6.8  ALBUMIN 4.0   No results for input(s): "LIPASE", "AMYLASE" in the last 168 hours. No results for input(s): "AMMONIA" in the last 168 hours. Coagulation Profile: No results for input(s): "INR", "PROTIME" in the last 168 hours. Cardiac Enzymes: No results for input(s): "CKTOTAL", "CKMB", "CKMBINDEX", "TROPONINI" in the last 168 hours. BNP (last 3 results) No results for input(s): "PROBNP" in the last 8760 hours. HbA1C: No results for input(s): "HGBA1C" in the last 72 hours. CBG: Recent Labs  Lab 09/26/21 1850  GLUCAP 125*   Lipid Profile: No results for input(s): "CHOL", "HDL", "LDLCALC", "TRIG", "CHOLHDL", "LDLDIRECT" in the last 72 hours. Thyroid Function Tests: No results for input(s): "TSH", "T4TOTAL", "FREET4", "T3FREE", "THYROIDAB" in the last 72 hours. Anemia Panel: No results for input(s): "VITAMINB12", "FOLATE", "FERRITIN", "TIBC", "IRON", "RETICCTPCT" in the last 72 hours. Sepsis Labs: No results for input(s): "PROCALCITON", "LATICACIDVEN" in the last 168 hours.  No results found for this or any previous visit (from the past 240 hour(s)).       Radiology Studies: No results found.      Scheduled Meds:  atorvastatin  20 mg Oral Daily   cholecalciferol  1,000 Units Oral Daily   dexamethasone  2  mg Oral Daily   levETIRAcetam  750 mg Oral BID   loratadine  10 mg Oral Daily   sertraline  100 mg Oral QHS   Continuous Infusions:   LOS: 0 days    Time spent: 35 minutes.    Lindsey Shiley, MD [facility]   If 7PM-7AM, please contact night-coverage [email or web address]  09/27/2021, 7:23 AM

## 2021-09-27 NOTE — Plan of Care (Signed)
Discussed with patient plan of care for the evening, pain management and calling for assistance when getting up to the bathroom with some teach back displayed.  Problem: Education: Goal: Knowledge of General Education information will improve Description: Including pain rating scale, medication(s)/side effects and non-pharmacologic comfort measures Outcome: Progressing   Problem: Health Behavior/Discharge Planning: Goal: Ability to manage health-related needs will improve Outcome: Progressing

## 2021-09-27 NOTE — Assessment & Plan Note (Signed)
noted 

## 2021-09-28 ENCOUNTER — Inpatient Hospital Stay (HOSPITAL_COMMUNITY): Payer: BC Managed Care – PPO

## 2021-09-28 DIAGNOSIS — D496 Neoplasm of unspecified behavior of brain: Secondary | ICD-10-CM | POA: Diagnosis not present

## 2021-09-28 LAB — BASIC METABOLIC PANEL
Anion gap: 9 (ref 5–15)
BUN: 15 mg/dL (ref 8–23)
CO2: 23 mmol/L (ref 22–32)
Calcium: 9.2 mg/dL (ref 8.9–10.3)
Chloride: 108 mmol/L (ref 98–111)
Creatinine, Ser: 0.73 mg/dL (ref 0.44–1.00)
GFR, Estimated: >60 mL/min (ref >60)
Glucose, Bld: 119 mg/dL — ABNORMAL HIGH (ref 70–99)
Potassium: 3.3 mmol/L — ABNORMAL LOW (ref 3.5–5.1)
Sodium: 140 mmol/L (ref 135–145)

## 2021-09-28 LAB — CBC
HCT: 37.3 % (ref 36.0–46.0)
Hemoglobin: 13.1 g/dL (ref 12.0–15.0)
MCH: 31.5 pg (ref 26.0–34.0)
MCHC: 35.1 g/dL (ref 30.0–36.0)
MCV: 89.7 fL (ref 80.0–100.0)
Platelets: 257 10*3/uL (ref 150–400)
RBC: 4.16 MIL/uL (ref 3.87–5.11)
RDW: 13.2 % (ref 11.5–15.5)
WBC: 9.9 10*3/uL (ref 4.0–10.5)
nRBC: 0 % (ref 0.0–0.2)

## 2021-09-28 MED ORDER — HALOPERIDOL LACTATE 5 MG/ML IJ SOLN
10.0000 mg | Freq: Once | INTRAMUSCULAR | Status: AC
Start: 2021-09-28 — End: 2021-09-28
  Administered 2021-09-28: 10 mg via INTRAMUSCULAR
  Filled 2021-09-28: qty 2

## 2021-09-28 MED ORDER — ADULT MULTIVITAMIN W/MINERALS CH
1.0000 | ORAL_TABLET | Freq: Every day | ORAL | Status: DC
  Administered 2021-09-28 – 2021-10-06 (×9): 1 via ORAL
  Filled 2021-09-28 (×9): qty 1

## 2021-09-28 MED ORDER — POTASSIUM CHLORIDE CRYS ER 20 MEQ PO TBCR
40.0000 meq | EXTENDED_RELEASE_TABLET | Freq: Once | ORAL | Status: AC
  Administered 2021-09-28: 40 meq via ORAL
  Filled 2021-09-28: qty 2

## 2021-09-28 MED ORDER — POTASSIUM CHLORIDE CRYS ER 20 MEQ PO TBCR
20.0000 meq | EXTENDED_RELEASE_TABLET | Freq: Once | ORAL | Status: AC
  Administered 2021-09-28: 20 meq via ORAL
  Filled 2021-09-28: qty 1

## 2021-09-28 MED ORDER — ENSURE ENLIVE PO LIQD
237.0000 mL | Freq: Two times a day (BID) | ORAL | Status: DC
  Administered 2021-09-29 – 2021-10-05 (×12): 237 mL via ORAL

## 2021-09-28 NOTE — Progress Notes (Signed)
Initial Nutrition Assessment  DOCUMENTATION CODES:   Not applicable  INTERVENTION:  Liberalize diet from a heart healthy to a regular diet to provide widest variety of menu options to enhance nutritional adequacy Ensure Plus High Protein po BID, each supplement provides 350 kcal and 20 grams of protein. MVI with minerals daily  NUTRITION DIAGNOSIS:   Inadequate oral intake related to cancer and cancer related treatments, lethargy/confusion as evidenced by meal completion < 50%.  GOAL:   Patient will meet greater than or equal to 90% of their needs  MONITOR:   PO intake, Supplement acceptance, Diet advancement, Labs, Weight trends, I & O's  REASON FOR ASSESSMENT:   Malnutrition Screening Tool    ASSESSMENT:   Pt admitted with fatigue secondary to brain tumor. 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   Pt has been requiring walker for ambulatory assistance since d/t 8/8. At that time CT head showed progression of her tumor.   Pt now noted to have AMS. CT finding stable form MRI yesterday. Pt noted to be more sleepy today. Per Flowsheet, a/o x4.   Unsuccessful attempt to reach pt via phone call to room. One documented meal completion on file of 0%.  Reviewed wt history. Current admit wt appears stable within the last month. Her current wt is elevated from previously documented wt from December to June.   Edema: non-pitting BLE  Labs: potassium 3.3 (replacing)  Medications: Vitamin D3, decadron, protonix  UOP: 44m x24 hours I/O's: +[id number] admission  NUTRITION - FOCUSED PHYSICAL EXAM: RD working remotely. Deferred to follow up.   Diet Order:   Diet Order             Diet Heart Room service appropriate? Yes; Fluid consistency: Thin  Diet effective now                    EDUCATION NEEDS:   No education needs have been identified at this time  Skin:  Skin Assessment: Reviewed RN Assessment  Last BM:  8/12  Height:   Ht Readings from Last 1 Encounters:  09/26/21 '5\' 2"'$  (1.575 m)    Weight:   Wt Readings from Last 1 Encounters:  09/26/21 79.4 kg   BMI:  Body mass index is 32.01 kg/m.  Estimated Nutritional Needs:   Kcal:  1700-1900  Protein:  85-100g  Fluid:  >/=1.7L  AlClayborne DanaRDN, LDN Clinical Nutrition

## 2021-09-28 NOTE — Consult Note (Signed)
Responded to Spiritual Care consult for Advanced Directive. Patient was sleeping, left document on bedside table, education was not given, once patient is ready, please contact chaplain office to help facilitate education and notary if needed.

## 2021-09-28 NOTE — Plan of Care (Signed)
  Problem: Education: Goal: Knowledge of General Education information will improve Description Including pain rating scale, medication(s)/side effects and non-pharmacologic comfort measures Outcome: Progressing   Problem: Health Behavior/Discharge Planning: Goal: Ability to manage health-related needs will improve Outcome: Progressing   

## 2021-09-28 NOTE — TOC Progression Note (Signed)
Transition of Care [facility]) - Progression Note    Patient Details  Name: Lindsey Daniels MRN: [id number] Date of Birth: 30-Sep-1955  Transition of Care [facility]) CM/SW Contact  Ross Ludwig, [place] Phone Number: 09/28/2021, 6:23 PM  Clinical Narrative:     Patient currently open to [facility] for [facility] PT.  CSW to continue to follow patient's progress throughout discharge planning.      Expected Discharge Plan and Services                                                 Social Determinants of Health (SDOH) Interventions    Readmission Risk Interventions     No data to display

## 2021-09-28 NOTE — Plan of Care (Signed)
Discussed plan of care for the evening with patient, pain management and rest with some teach back displayed.  Problem: Education: Goal: Knowledge of General Education information will improve Description: Including pain rating scale, medication(s)/side effects and non-pharmacologic comfort measures Outcome: Not Progressing

## 2021-09-28 NOTE — Progress Notes (Signed)
Patient finally fully awake; Walked to [employer] and back to bed in a sitting position at this time.  Patient's dinner reheated and given a fresh drink with night time pills,

## 2021-09-28 NOTE — Evaluation (Signed)
Occupational Therapy Evaluation Patient Details Name: Lindsey Daniels MRN: [id number] DOB: 09-25-1955 Today's Date: 09/28/2021   History of Present Illness Patient is a 66 y.o. 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 Patient was just recently discharged on 09/23/2021, at that time CT showed progression of her tumor with edema and mass effect.  Oncology Dr. Mickeal Skinner was consulted and recommended IV Decadron and transition to 2 mg daily.  She has been requiring a walker for ambulation since discharge.  She has also been sleeping more.   Clinical Impression   Pt admitted with the above diagnoses and presents with below problem list. Pt will benefit from continued acute OT to address the below listed deficits and maximize independence with basic ADLs prior to d/c home. At baseline, pt is independent with ADLs. Pt presents with impaired balance, cognitive deficits, decreased activity tolerance impacting assist level with ADLs. Pt currently needs up to close min guard assist with LB ADLs and functional transfers/mobility.        Recommendations for follow up therapy are one component of a multi-disciplinary discharge planning process, led by the attending physician.  Recommendations may be updated based on patient status, additional functional criteria and insurance authorization.   Follow Up Recommendations  Home health OT    Assistance Recommended at Discharge Frequent or constant Supervision/Assistance  Patient can return home with the following A little help with walking and/or transfers;A little help with bathing/dressing/bathroom;Assist for transportation;Assistance with cooking/housework;Direct supervision/assist for financial  management;Direct supervision/assist for medications management    Functional Status Assessment  Patient has had a recent decline in their functional status and demonstrates the ability to make significant improvements in function in a reasonable and predictable amount of time.  Equipment Recommendations  None recommended by OT    Recommendations for Other Services       Precautions / Restrictions Precautions Precautions: Fall Precaution Comments: Monitor HR Restrictions Weight Bearing Restrictions: No      Mobility Bed Mobility Overal bed mobility: Needs Assistance Bed Mobility: Supine to Sit, Sit to Supine     Supine to sit: Supervision, HOB elevated Sit to supine: Min guard   General bed mobility comments: fatigued at end of session.  min guard for safety    Transfers Overall transfer level: Needs assistance Equipment used: Rolling walker (2 wheels) Transfers: Sit to/from Stand Sit to Stand: Min guard           General transfer comment: guarding for safety, cues for safety and technique with rw. slow processing, difficulty sequencing      Balance Overall balance assessment: Needs assistance Sitting-balance support: Feet supported Sitting balance-Leahy Scale: Good     Standing balance support: Bilateral upper extremity supported, Single extremity supported, During functional activity, Reliant on assistive device for balance Standing balance-Leahy Scale: Poor                             ADL either performed or assessed with clinical judgement   ADL Overall ADL's : Needs assistance/impaired Eating/Feeding: Set up;Sitting   Grooming: Set up;Sitting   Upper Body Bathing: Sitting;Set up   Lower Body Bathing: Min guard;Sit to/from stand   Upper Body Dressing : Set up;Sitting   Lower Body Dressing: Sit to/from stand;Sitting/lateral leans;Min guard   Toilet Transfer:  Rolling walker (2 wheels);Cueing for sequencing;Min guard   Toileting-  Water quality scientist and Hygiene: Min guard;Sit to/from stand;Cueing for safety       Functional mobility during ADLs: Rolling walker (2 wheels);Cueing for sequencing;Cueing for safety;Min guard General ADL Comments: Pt completed household distance mobility. difficulty following directional cues and sequencing turns. Tendency to drift left     Vision         Perception     Praxis      Pertinent Vitals/Pain Pain Assessment Pain Assessment: No/denies pain     Hand Dominance Right   Extremity/Trunk Assessment Upper Extremity Assessment Upper Extremity Assessment: Overall WFL for tasks assessed;Generalized weakness   Lower Extremity Assessment Lower Extremity Assessment: Defer to PT evaluation       Communication     Cognition Arousal/Alertness: Awake/alert, Lethargic (quickly falls asleep, rouses easily) Behavior During Therapy: Flat affect, WFL for tasks assessed/performed Overall Cognitive Status: Impaired/Different from baseline Area of Impairment: Attention, Memory, Following commands, Safety/judgement, Awareness, Problem solving                   Current Attention Level: Sustained Memory: Decreased short-term memory Following Commands: Follows one step commands with increased time, Follows multi-step commands with increased time, Follows multi-step commands inconsistently, Follows one step commands inconsistently Safety/Judgement: Decreased awareness of deficits, Decreased awareness of safety Awareness: Emergent Problem Solving: Slow processing, Decreased initiation, Difficulty sequencing, Requires verbal cues General Comments: tangential and at times circumstantial speech. verbal perseveration. limited safety awareness     General Comments       Exercises     Shoulder Instructions      Home Living Family/patient expects to be discharged to:: Private residence Living Arrangements: Alone Available Help at Discharge: Family;Friend(s);Neighbor Type  of Home: House Home Access: Stairs to enter [employer] of Steps: 1 small threshold Entrance Stairs-Rails: None Home Layout: One level     Bathroom Shower/Tub: Occupational psychologist: Standard Bathroom Accessibility: Yes   Home Equipment: Cane - single point;Grab bars - tub/shower   Additional Comments: pt reports multiple friends and brother can help. pt mentions, Oswaldo Conroy.      Prior Functioning/Environment Prior Level of Function : Working/employed;Driving;Independent/Modified Independent                        OT Problem List: Decreased activity tolerance      OT Treatment/Interventions: Self-care/ADL training;Therapeutic activities;Cognitive remediation/compensation;Energy conservation;Balance training;DME and/or AE instruction;Patient/family education    OT Goals(Current goals can be found in the care plan section) Acute Rehab OT Goals Patient Stated Goal: not stated OT Goal Formulation: With patient Time For Goal Achievement: 10/12/21 Potential to Achieve Goals: Fair ADL Goals Pt Will Perform Grooming: with modified independence;with set-up;with supervision;standing Pt Will Perform Upper Body Dressing: with set-up;sitting Pt Will Perform Lower Body Dressing: with modified independence;sit to/from stand Pt Will Transfer to Toilet: with modified independence;ambulating Pt Will Perform Toileting - Clothing Manipulation and hygiene: with modified independence;sit to/from stand  OT Frequency: Min 2X/week    Co-evaluation              AM-PAC OT "6 Clicks" Daily Activity     Outcome Measure Help from another person eating meals?: None Help from another person taking care of personal grooming?: A Little Help from another person toileting, which includes using toliet, bedpan, or urinal?: A Little Help from another person bathing (including washing, rinsing, drying)?: A Little Help from another person to  put on and taking  off regular upper body clothing?: A Little Help from another person to put on and taking off regular lower body clothing?: A Little 6 Click Score: 19   End of Session Equipment Utilized During Treatment: Rolling walker (2 wheels)  Activity Tolerance: Patient limited by fatigue Patient left: in bed;with call bell/phone within reach  OT Visit Diagnosis: Other symptoms and signs involving cognitive function                Time: 9407-6808 OT Time Calculation (min): 22 min Charges:  OT General Charges $OT Visit: 1 Visit OT Evaluation $OT Eval Low Complexity: [place], OT Acute Rehabilitation Services Office: [phone]   Hortencia Pilar 09/28/2021, 3:13 PM

## 2021-09-28 NOTE — Progress Notes (Addendum)
Informed by RN pt very agitated and Ativan increased agitation. Qtc on EKG 489 ms-opted to dc Ativan and give 1x dose HALDOL IM tonight. Will check EKG in am. Suspect current steroids being given for vasogenic edema contributing. Staff also instructed to give pt pain med in the event her agitation is coming from pain that she is unable to express given her confusion and agitation

## 2021-09-28 NOTE — Progress Notes (Signed)
PROGRESS NOTE    Lindsey Daniels  ZOX:[id number] DOB: 19-Jul-1955 DOA: 09/26/2021 PCP: Lindsey Mons, PA   Brief Narrative: 66 year old with past medical history significant for 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  Patient was just recently discharged on 09/23/2021, at that time CT showed progression of her tumor with edema and mass effect.  Oncology Dr. Mickeal Daniels was consulted and recommended IV Decadron and transition to 2 mg daily.  She has been requiring a walker for ambulation since discharge.  She has also been sleeping more.   Assessment & Plan:   Principal Problem:   Brain tumor ([place]) Active Problems:   Pleomorphic xanthoastrocytoma ([place])   Hyperlipidemia   Seizure disorder ([place])   HTN (hypertension)   Obesity (BMI 30-39.9)  1-Pleomorphic Xanthoastrocytoma -Status post temporal craniotomy, resection 2015 with progression of disease status post cycle craniotomy with subtotal resection and radiation. -80/80/2023: CT head showed progression of her tumor with edema mass effect.  Decadron was resumed. -Received loading dose of 10 mg IV Decadron in the ED.  She was on 2 mg decadron daily. -Discussed with Dr. Mickeal Daniels, he recommend MRI brain.  He will review Lindsey Daniels MRI and case at the tumor board on Monday to determine future treatment options. -He recommended to increase Decadron to 4 mg twice daily.  Her symptoms might be explaining to rapid taper of steroid. -MRI with worsening finding, discussed with Dr Lindsey Daniels, plan to continue with IV steroids. Plan to discuss case at Tumor board.  -Repeated CT head this am due to AMS. CT finding stable from yesterday MRI.    2-Acute Metabolic Encephalopathy;  Multifactorial cereal edema, astrocytoma, Steroids.  She  is sleepy today, suspect related to Haldol, ativan, but CT head ordered.  CT head stable finding compare to MRI from yesterday.   Hypertension; Started  Norvasc. Adjust as needed.   Seizure disorder Continue with Keppra.   Hyperlipidemia Continue with lipitor.   Obesity; Life style modification.   Hypokalemia; replete orally.   Estimated body mass index is 32.01 kg/m as calculated from the following:   Height as of this encounter: '5\' 2"'$  (1.575 m).   Weight as of this encounter: 79.4 kg.   DVT prophylaxis: SCD Code Status: Full code Family Communication: Care discussed with patient. Brother over phone  Disposition Plan:  Status is: Observation The patient will require care spanning > 2 midnights and should be moved to inpatient because: management of confusion     Consultants:  Dr Lindsey Daniels  Procedures:    Antimicrobials:    Subjective: She is sleepy this am, wake up to voice, says few words, was abel to tell me her name. She goes back to sleep.     Objective: Vitals:   09/28/21 0300 09/28/21 0429 09/28/21 0530 09/28/21 1105  BP:   (!) 170/85 (!) 153/93  Pulse:   82   Resp: 17 15    Temp:   97.8 F (36.6 C)   TempSrc:   Oral   SpO2:   97%   Weight:      Height:        Intake/Output Summary (Last 24 hours) at 09/28/2021 1133 Last data filed at 09/28/2021 0541 Gross per 24 hour  Intake 360 ml  Output 0 ml  Net 360 ml    Filed Weights   09/26/21 1840  Weight:  79.4 kg    Examination:  General exam: NAD Respiratory system: CTA Cardiovascular system: S 1, S 2 RRR Gastrointestinal system: BS present, soft, nt Central nervous system: sleepy , protecting airway.  Extremities: no edema    Data Reviewed: I have personally reviewed following labs and imaging studies  CBC: Recent Labs  Lab 09/22/21 1127 09/23/21 0511 09/26/21 1900 09/28/21 0749  WBC 8.6 9.8 8.6 9.9  NEUTROABS 6.4  --   --   --   HGB 15.5* 15.0 14.5 13.1  HCT 44.7 42.6 41.2  37.3  MCV 90.7 89.9 90.0 89.7  PLT 284 316 279 786    Basic Metabolic Panel: Recent Labs  Lab 09/22/21 1127 09/23/21 0511 09/26/21 1900 09/28/21 0749  NA 138 138 140 140  K 3.3* 3.3* 3.9 3.3*  CL 101 104 107 108  CO2 '25 23 23 23  '$ GLUCOSE 122* 117* 127* 119*  BUN '17 17 11 15  '$ CREATININE 0.78 0.58 0.65 0.73  CALCIUM 9.2 9.6 9.4 9.2  MG 2.0  --   --   --     GFR: Estimated Creatinine Clearance: 66.6 mL/min (by C-G formula based on SCr of 0.73 mg/dL). Liver Function Tests: Recent Labs  Lab 09/23/21 0511  AST 16  ALT 18  ALKPHOS 68  BILITOT 1.1  PROT 6.8  ALBUMIN 4.0    No results for input(s): "LIPASE", "AMYLASE" in the last 168 hours. No results for input(s): "AMMONIA" in the last 168 hours. Coagulation Profile: No results for input(s): "INR", "PROTIME" in the last 168 hours. Cardiac Enzymes: No results for input(s): "CKTOTAL", "CKMB", "CKMBINDEX", "TROPONINI" in the last 168 hours. BNP (last 3 results) No results for input(s): "PROBNP" in the last 8760 hours. HbA1C: No results for input(s): "HGBA1C" in the last 72 hours. CBG: Recent Labs  Lab 09/26/21 1850  GLUCAP 125*    Lipid Profile: No results for input(s): "CHOL", "HDL", "LDLCALC", "TRIG", "CHOLHDL", "LDLDIRECT" in the last 72 hours. Thyroid Function Tests: No results for input(s): "TSH", "T4TOTAL", "FREET4", "T3FREE", "THYROIDAB" in the last 72 hours. Anemia Panel: No results for input(s): "VITAMINB12", "FOLATE", "FERRITIN", "TIBC", "IRON", "RETICCTPCT" in the last 72 hours. Sepsis Labs: No results for input(s): "PROCALCITON", "LATICACIDVEN" in the last 168 hours.  No results found for this or any previous visit (from the past 240 hour(s)).       Radiology Studies: CT HEAD WO CONTRAST (5MM)  Result Date: 09/28/2021 CLINICAL DATA:  66 year old female with history of PXA, progression. EXAM: CT HEAD WITHOUT CONTRAST TECHNIQUE: Contiguous axial images were obtained from the base of the skull  through the vertex without intravenous contrast. RADIATION DOSE REDUCTION: This exam was performed according to the departmental dose-optimization program which includes automated exposure control, adjustment of the mA and/or kV according to patient size and/or use of iterative reconstruction technique. COMPARISON:  Restaging brain MRI yesterday, and earlier. FINDINGS: Brain: Intracranial mass effect with leftward midline shift up to 10 mm stable from the MRI yesterday. Right temporal lobe cystic resection cavity with abundant right hemisphere edema, and edema tracking into the right brainstem (better demonstrated by MRI) appears stable since the CT 09/22/2021. Basilar cisterns remain patent, and the suprasellar cistern appears slightly improved from the recent CT and MRI. No acute intracranial hemorrhage identified. Effaced right lateral ventricle with mildly enlarged left lateral ventricle but no convincing transependymal edema. No cortically based acute infarct identified. Vascular: No suspicious intracranial vascular hyperdensity. Skull: Stable right side craniotomy. Sinuses/Orbits: Visualized paranasal sinuses and mastoids are  clear. Other: No acute orbit or scalp soft tissue finding. IMPRESSION: 1. Stable compared to restaging Brain MRI yesterday. Abundant right hemisphere edema tracking into the brainstem and associated with intracranial mass effect, midline shift up to 10 mm. Mildly enlarged left lateral ventricle but no convincing transependymal edema. 2. No new intracranial abnormality. Electronically Signed   By: Genevie Ann M.D.   On: 09/28/2021 10:47   MR BRAIN W WO CONTRAST  Result Date: 09/27/2021 CLINICAL DATA:  66 year old female with a history of PE XA, WHO grade 2. Original tumor resection in 2015. Progression and Re-resection in 2022, pathology unchanged. Status post IMRT in January and February this year. Treatment effect versus tumor progression since that time. Restaging. EXAM: MRI HEAD  WITHOUT AND WITH CONTRAST TECHNIQUE: Multiplanar, multiecho pulse sequences of the brain and surrounding structures were obtained without and with intravenous contrast. CONTRAST:  79m GADAVIST GADOBUTROL 1 MMOL/ML IV SOLN COMPARISON:  Brain MRI 08/29/2021 and earlier.  Head CT 09/22/2021. FINDINGS: Brain: Heterogeneously enhancing lesion now encompasses 47 x 51 x 38 mm (AP by transverse by CC) centered at the right temporal lobe, versus 45 x 48 x 38 mm in May this year. But some areas of tumor enhancement do appear less solid since that time. And diffusion remains facilitated throughout much of the lesion. Stable mild hemosiderin within the lesion. However, extensive T2 and FLAIR hyperintensity has significantly progressed since May and is now associated with intracranial mass effect and leftward midline shift up to 10 mm (only trace midline shift 06/27/2021 and 5-6 mm last month). And new T2 and FLAIR hyperintensity tracking through the right cerebral peduncle and as far as the dorsal pons now. This brainstem signal is also largely new since last month. Mass effect on the right lateral ventricle. And mild left lateral ventriculomegaly. No definite transependymal edema. Partially effaced suprasellar cistern now. Other basilar cisterns remain patent. No dural thickening. No superimposed restricted diffusion to suggest acute infarction. No other abnormal intracranial enhancement identified. Acute intracranial hemorrhage. Cervicomedullary junction and pituitary are within normal limits. Vascular: Major intracranial vascular flow voids are stable. The major dural venous sinuses are enhancing and appear to be patent. Skull and upper cervical spine: Negative visible cervical spine. Visualized bone marrow signal is within normal limits. Sinuses/Orbits: Stable and negative. Other: Mild right mastoid effusion is stable. IMPRESSION: 1. Progression of intracranial mass effect since May (see #2) in conjunction with increased  infiltrative T2/FLAIR hyperintensity in the right hemisphere and now tracking into the right brainstem. Now unclear whether this is vasogenic edema or infiltrating tumor. At the same time, the heterogeneously enhancing right temporal lobe lesion has enlarged by up to 2 mm in each dimension since 06/27/2021. 2. Leftward midline shift is now 10 mm. Increased mass effect on the right lateral ventricle, and partially effaced suprasellar cistern. Mildly enlarged left lateral ventricle but no transependymal edema. Electronically Signed   By: HGenevie AnnM.D.   On: 09/27/2021 13:06        Scheduled Meds:  amLODipine  5 mg Oral Daily   atorvastatin  20 mg Oral Daily   cholecalciferol  1,000 Units Oral Daily   dexamethasone (DECADRON) injection  4 mg Intravenous Q12H   levETIRAcetam  750 mg Oral BID   loratadine  10 mg Oral Daily   pantoprazole  40 mg Oral BID   sertraline  100 mg Oral QHS   Continuous Infusions:   LOS: 1 day    Time spent: 35 minutes.  Elmarie Shiley, MD [facility]   If 7PM-7AM, please contact night-coverage [email or web address]  09/28/2021, 11:33 AM

## 2021-09-28 NOTE — Progress Notes (Signed)
Daily Progress Note   Patient Name: Lindsey Daniels       Date: 09/28/2021 DOB: 11-01-1955  Age: 66 y.o. MRN#: [id number] Attending Physician: Elmarie Shiley, MD Primary Care Physician: Harrison Mons, PA Admit Date: 09/26/2021  Reason for Consultation/Follow-up: Establishing goals of care  Subjective: Overnight events noted, patient noted to be agitated and was given Haldol. Morning, the patient just arrived from CT scan back into her room when I saw her.  She has mittens on she is completely awake alert and oriented.  She is asking for her mittens to be removed so that she can have breakfast.  She does not recall what happened last night.  All she recalls is that she was feeling cold.  Denies any pain or headache.  Length of Stay: 1  Current Medications: Scheduled Meds:   amLODipine  5 mg Oral Daily   atorvastatin  20 mg Oral Daily   cholecalciferol  1,000 Units Oral Daily   dexamethasone (DECADRON) injection  4 mg Intravenous Q12H   levETIRAcetam  750 mg Oral BID   loratadine  10 mg Oral Daily   pantoprazole  40 mg Oral BID   potassium chloride  40 mEq Oral Once   sertraline  100 mg Oral QHS    Continuous Infusions:   PRN Meds: acetaminophen, HYDROcodone-acetaminophen, labetalol, mouth rinse  Physical Exam         Alert oriented Resting in bed Regular work of breathing Follows commands and converses normally Abdomen not distended  Vital Signs: BP (!) 170/85 (BP Location: Right Arm)   Pulse 82   Temp 97.8 F (36.6 C) (Oral)   Resp 15   Ht '5\' 2"'$  (1.575 m)   Wt 79.4 kg   SpO2 97%   BMI 32.01 kg/m  SpO2: SpO2: 97 % O2 Device: O2 Device: Room Air O2 Flow Rate:    Intake/output summary:  Intake/Output Summary (Last 24 hours) at 09/28/2021 1101 Last data filed  at 09/28/2021 0541 Gross per 24 hour  Intake 360 ml  Output 0 ml  Net 360 ml   LBM: Last BM Date : 09/27/21 Baseline Weight: Weight: 79.4 kg Most recent weight: Weight: 79.4 kg       Palliative Assessment/Data:      Patient Active Problem List   Diagnosis Date Noted  Obesity (BMI 30-39.9) 09/27/2021   Brain tumor ([place]) 09/27/2021   Headache 78/29/5621   Acute metabolic encephalopathy 30/86/5784   Weakness of both lower extremities 09/22/2021   HTN (hypertension) 09/22/2021   Focal seizures ([place]) 07/15/2021   Depression 07/15/2021   Protein-calorie malnutrition, severe 11/28/2020   Cerebral edema ([place]) 11/25/2020   Increased body mass index 05/09/2019   Seasonal allergies 11/21/2016   Loss of height 11/21/2016   Elevated BP without diagnosis of hypertension 11/21/2016   Exposure to TB 06/07/2015   HSV infection 06/07/2015   Depressed mood 06/07/2015   Carpal tunnel syndrome 03/06/2014   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   Syncope 08/18/2013   Seizure disorder ([place]) 08/18/2013   History of benign neoplasm of brain 02/16/2013   Bilateral leg pain 09/07/2012   Foot pain, bilateral 09/07/2012   Plantar fasciitis BILATERAL 08/25/2012   Hyperlipidemia 12/24/2011    Palliative Care Assessment & Plan   Patient Profile:    Assessment:  66 year old with past medical history significant for 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   Patient was just recently discharged on 09/23/2021, at that time CT showed progression of her tumor with edema and mass effect.  Oncology Dr. Mickeal Skinner was consulted and recommended IV Decadron and transition to 2 mg daily.  She has been requiring a walker for ambulation since discharge.  She has also been  sleeping more.    MRI on 09-27-21: Progression of intracranial mass effect since May (see #2) in conjunction with increased infiltrative T2/FLAIR hyperintensity in the right hemisphere and now tracking into the right brainstem. Now unclear whether this is vasogenic edema or infiltrating tumor. At the same time, the heterogeneously enhancing right temporal lobe lesion has enlarged by up to 2 mm in each dimension since 06/27/2021.    Leftward midline shift is now 10 mm. Increased mass effect on the right lateral ventricle, and partially effaced suprasellar cistern. Mildly enlarged left lateral ventricle but no transependymal edema. Patient has been admitted to hospital medicine service, palliative medicine consulted for ongoing goals of care discussions.  CT head on 09-28-21: Stable compared to restaging Brain MRI yesterday. Abundant right hemisphere edema tracking into the brainstem and associated with intracranial mass effect, midline shift up to 10 mm. Mildly enlarged left lateral ventricle but no convincing transependymal edema.  Discussion/recommendations/Plan: Patient underwent MRI of the brain on 09-27-2021, just returned from CT scan of the brain.  Endorses continuation of full code/full scope care, states that she wants to discuss further with oncology.  Will request chaplain consult for completion of advance directive per patient's request. Continue current mode of care.   Goals of Care and Additional Recommendations: Limitations on Scope of Treatment: Full Scope Treatment  Code Status:    Code Status Orders  (From admission, onward)           Start     Ordered   09/27/21 0113  Full code  Continuous        09/27/21 0113           Code Status History     Date Active Date Inactive Code Status Order ID Comments User Context   09/22/2021 1631 09/23/2021 2038 Full Code [id number]  Jonnie Finner, DO ED   07/15/2021 1634 07/16/2021 2124 Full Code [id number]  Collier Bullock, MD ED   11/25/2020 2046  12/04/2020 0000 Full Code [id number]  Ashok Pall, MD Inpatient   10/02/2013 1235 10/04/2013 1317 Full Code [id number]  Hosie Spangle, MD Inpatient   08/18/2013 1605 08/19/2013 1426 Full Code [id number]  Domenic Polite, MD Inpatient      Advance Directive Documentation    Flowsheet Row Most Recent Value  Type of Advance Directive Healthcare Power of Attorney  Pre-existing out of facility DNR order (yellow form or pink MOST form) --  "MOST" Form in Place? --       Prognosis:  Unable to determine  Discharge Planning: To Be Determined  Care plan was discussed with  patient.   Thank you for allowing the Palliative Medicine Team to assist in the care of this patient.   Mod MDM     Greater than 50%  of this time was spent counseling and coordinating care related to the above assessment and plan.  Loistine Chance, MD  Please contact Palliative Medicine Team phone at [phone] for questions and concerns.

## 2021-09-28 NOTE — Progress Notes (Signed)
OT Cancellation Note  Patient Details Name: Lindsey Daniels MRN: [id number] DOB: [birth year]   Cancelled Treatment:    Reason Eval/Treat Not Completed: Other (comment). Pt currently sleeping soundly. Noted events earlier this morning, will prioritize rest and reattempt at a later time.   Tyrone Schimke, OT Acute Rehabilitation Services Office: [phone]  09/28/2021, 9:35 AM

## 2021-09-29 DIAGNOSIS — D496 Neoplasm of unspecified behavior of brain: Secondary | ICD-10-CM | POA: Diagnosis not present

## 2021-09-29 LAB — CBC
HCT: 37.1 % (ref 36.0–46.0)
Hemoglobin: 12.8 g/dL (ref 12.0–15.0)
MCH: 31.4 pg (ref 26.0–34.0)
MCHC: 34.5 g/dL (ref 30.0–36.0)
MCV: 90.9 fL (ref 80.0–100.0)
Platelets: 249 10*3/uL (ref 150–400)
RBC: 4.08 MIL/uL (ref 3.87–5.11)
RDW: 13.2 % (ref 11.5–15.5)
WBC: 10.4 10*3/uL (ref 4.0–10.5)
nRBC: 0 % (ref 0.0–0.2)

## 2021-09-29 LAB — MAGNESIUM: Magnesium: 2.2 mg/dL (ref 1.7–2.4)

## 2021-09-29 LAB — BASIC METABOLIC PANEL
Anion gap: 8 (ref 5–15)
BUN: 21 mg/dL (ref 8–23)
CO2: 23 mmol/L (ref 22–32)
Calcium: 8.9 mg/dL (ref 8.9–10.3)
Chloride: 107 mmol/L (ref 98–111)
Creatinine, Ser: 0.66 mg/dL (ref 0.44–1.00)
GFR, Estimated: >60 mL/min (ref >60)
Glucose, Bld: 157 mg/dL — ABNORMAL HIGH (ref 70–99)
Potassium: 4.1 mmol/L (ref 3.5–5.1)
Sodium: 138 mmol/L (ref 135–145)

## 2021-09-29 MED ORDER — AMLODIPINE BESYLATE 10 MG PO TABS
10.0000 mg | ORAL_TABLET | Freq: Every day | ORAL | Status: DC
  Administered 2021-09-30 – 2021-10-06 (×7): 10 mg via ORAL
  Filled 2021-09-29 (×7): qty 1

## 2021-09-29 NOTE — Progress Notes (Signed)
Physical Therapy Treatment Patient Details Name: Lindsey Daniels MRN: [id number] DOB: [birth year] Today's Date: 09/29/2021   History of Present Illness Patient is a 66 y.o. 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 Patient was just recently discharged on 09/23/2021, at that time CT showed progression of her tumor with edema and mass effect.  Oncology Dr. Mickeal Skinner was consulted and recommended IV Decadron and transition to 2 mg daily.  She has been requiring a walker for ambulation since discharge.  She has also been sleeping more.    PT Comments    MD requested reassessment for d/c plan. Currently, pt continues to require cues for safe mobility. She exhibits poor safety awareness. She can become easily distracted from task at hand. Pt lives alone at baseline. Unfortunately, she hasn't been able to demonstrate the ability to safely manage at home alone. PT recommendation has been updated to ST SNF. If for some reason, SNF is not an option, highly recommend we try to maximize home health services ([place], [place], home health aide). Will continue to follow pt during this hospital stay.    Recommendations for follow up therapy are one component of a multi-disciplinary discharge planning process, led by the attending physician.  Recommendations may be updated based on patient status, additional functional criteria and insurance authorization.  Follow Up Recommendations  Skilled nursing-short term rehab (<3 hours/day) Can patient physically be transported by private vehicle: Yes   Assistance Recommended at Discharge Frequent or constant Supervision/Assistance  Patient can return home with the following A little help with walking and/or transfers;A little  help with bathing/dressing/bathroom;Assistance with cooking/housework;Direct supervision/assist for medications management;Assist for transportation;Help with stairs or ramp for entrance   Equipment Recommendations  None recommended by PT    Recommendations for Other Services       Precautions / Restrictions Precautions Precautions: Fall Restrictions Weight Bearing Restrictions: No     Mobility  Bed Mobility Overal bed mobility: Needs Assistance Bed Mobility: Supine to Sit, Sit to Supine     Supine to sit: Supervision, HOB elevated Sit to supine: Supervision, HOB elevated   General bed mobility comments: Supv for safety. Increased time.    Transfers Overall transfer level: Needs assistance Equipment used: Rolling walker (2 wheels) Transfers: Sit to/from Stand Sit to Stand: Min guard           General transfer comment: Min guard for safety. Cues for safety.    Ambulation/Gait Ambulation/Gait assistance: Min assist, Min guard Gait Distance (Feet): 150 Feet Assistive device: Rolling walker (2 wheels), None Gait Pattern/deviations: Step-through pattern, Decreased stride length       General Gait Details: Walked ~150 feet with a RW-cues for safety, RW proximity, directional cues throughout ambulation distance; intermittent unsteadiness requiring assist to stabilize patient and manage RW due to patient's poor ability to properly judge turns and changes in direction. Then walked x 1 ~12 feet wtihout a device- Min A to stabilize with pt having to "cruise/furniture walk". Pt tolerated distance well.   Stairs             Wheelchair Mobility    Modified Rankin (Stroke Patients Only)       Balance Overall balance assessment: Needs assistance         Standing balance support: Bilateral upper extremity supported, Single extremity supported, During functional activity, Reliant  on assistive device for balance Standing balance-Leahy Scale: Poor                               Cognition Arousal/Alertness: Awake/alert Behavior During Therapy: Flat affect, WFL for tasks assessed/performed Overall Cognitive Status: Impaired/Different from baseline Area of Impairment: Attention, Memory, Following commands, Safety/judgement, Awareness, Problem solving                   Current Attention Level: Selective Memory: Decreased short-term memory Following Commands: Follows one step commands with increased time, Follows multi-step commands with increased time, Follows multi-step commands inconsistently, Follows one step commands inconsistently Safety/Judgement: Decreased awareness of deficits, Decreased awareness of safety Awareness: Emergent Problem Solving: Requires verbal cues General Comments: tangential at times. impaired safety awareness        Exercises      General Comments        Pertinent Vitals/Pain Pain Assessment Pain Assessment: Faces Faces Pain Scale: Hurts a little bit Pain Location: legs Pain Descriptors / Indicators:  ("cramping") Pain Intervention(s): Monitored during session    Home Living                          Prior Function            PT Goals (current goals can now be found in the care plan section) Progress towards PT goals: Progressing toward goals    Frequency    Min 3X/week      PT Plan Discharge plan needs to be updated    Co-evaluation              AM-PAC PT "6 Clicks" Mobility   Outcome Measure  Help needed turning from your back to your side while in a flat bed without using bedrails?: A Little Help needed moving from lying on your back to sitting on the side of a flat bed without using bedrails?: A Little Help needed moving to and from a bed to a chair (including a wheelchair)?: A Little Help needed standing up from a chair using your arms (e.g., wheelchair or bedside chair)?: A Little Help needed to walk in hospital room?: A Little Help needed climbing 3-5  steps with a railing? : A Lot 6 Click Score: 17    End of Session Equipment Utilized During Treatment: Gait belt Activity Tolerance: Patient tolerated treatment well Patient left: in bed;with call bell/phone within reach;with family/visitor present   PT Visit Diagnosis: Unsteadiness on feet (R26.81);Muscle weakness (generalized) (M62.81);Difficulty in walking, not elsewhere classified (R26.2)     Time: 0034-9179 PT Time Calculation (min) (ACUTE ONLY): 17 min  Charges:  $Gait Training: 8-22 mins                        Doreatha Massed, PT Acute Rehabilitation  Office: [phone] Pager: [phone]

## 2021-09-29 NOTE — NC FL2 (Signed)
[place] LEVEL OF CARE SCREENING TOOL     IDENTIFICATION  Patient Name: Lindsey Daniels Birthdate: October 10, 1955 Sex: female Admission Date (Current Location): 09/26/2021  [facility] [place] Number:  Herbalist and Address:  [facility]  [place], [place]      Provider Number: [id number]  Attending Physician Name and Address:  Elmarie Shiley, MD  Relative Name and Phone Number:  Faythe Dingwall    Current Level of Care: Hospital Recommended Level of Care: [place] Prior Approval Number:    Date Approved/Denied:   PASRR Number: [id number]  Discharge Plan: SNF    Current Diagnoses: Patient Active Problem List   Diagnosis Date Noted   Obesity (BMI 30-39.9) 09/27/2021   Brain tumor ([place]) 09/27/2021   Headache 24/82/5003   Acute metabolic encephalopathy 70/48/8891   Weakness of both lower extremities 09/22/2021   HTN (hypertension) 09/22/2021   Focal seizures ([place]) 07/15/2021   Depression 07/15/2021   Protein-calorie malnutrition, severe 11/28/2020   Cerebral edema ([place]) 11/25/2020   Increased body mass index 05/09/2019   Seasonal allergies 11/21/2016   Loss of height 11/21/2016   Elevated BP without diagnosis of hypertension 11/21/2016   Exposure to TB 06/07/2015   HSV infection 06/07/2015   Depressed mood 06/07/2015   Carpal tunnel syndrome 03/06/2014   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   Syncope 08/18/2013   Seizure disorder ([place]) 08/18/2013   History of benign neoplasm of brain 02/16/2013   Bilateral leg pain 09/07/2012   Foot pain, bilateral 09/07/2012   Plantar fasciitis BILATERAL 08/25/2012   Hyperlipidemia 12/24/2011    Orientation RESPIRATION BLADDER Height & Weight     Self, Place  Normal Continent Weight: 79.4 kg Height:  '5\' 2"'$  (157.5 cm)  BEHAVIORAL SYMPTOMS/MOOD NEUROLOGICAL BOWEL NUTRITION STATUS    Convulsions/Seizures Continent Diet  AMBULATORY STATUS  COMMUNICATION OF NEEDS Skin   Limited Assist Verbally Normal                       Personal Care Assistance Level of Assistance  Bathing, Dressing, Feeding Bathing Assistance: Limited assistance Feeding assistance: Limited assistance Dressing Assistance: Limited assistance     Functional Limitations Info  Sight, Hearing, Speech Sight Info: Adequate Hearing Info: Adequate Speech Info: Adequate    SPECIAL CARE FACTORS FREQUENCY  PT (By licensed PT), OT (By licensed OT)     PT Frequency: 5x per week OT Frequency: 5x per week            Contractures Contractures Info: Not present    Additional Factors Info  Code Status, Allergies Code Status Info: FULL Allergies Info: Rifapentine Not Specified  Other (See Comments) Flu-like symptoms  Amoxicillin Low Allergy Rash   Clavulanic Acid Low  Rash           Current Medications (09/29/2021):  This is the current hospital active medication list Current Facility-Administered Medications  Medication Dose Route Frequency Provider Last Rate Last Admin   acetaminophen (TYLENOL) tablet 650 mg  650 mg Oral Q6H PRN Regalado, Belkys A, MD   650 mg at 09/28/21 2217   [START ON 09/30/2021] amLODipine (NORVASC) tablet 10 mg  10 mg Oral Daily Regalado, Belkys A, MD       atorvastatin (LIPITOR) tablet 20 mg  20 mg Oral Daily Tu, Ching T, DO   20 mg at 09/29/21 0904   cholecalciferol (VITAMIN D3) 25 MCG (1000 UNIT) tablet 1,000 Units  1,000 Units  Oral Daily Tu, Ching T, DO   1,000 Units at 09/29/21 0904   dexamethasone (DECADRON) injection 4 mg  4 mg Intravenous Q12H Regalado, Belkys A, MD   4 mg at 09/29/21 0904   feeding supplement (ENSURE ENLIVE / ENSURE PLUS) liquid 237 mL  237 mL Oral BID BM Regalado, Belkys A, MD   237 mL at 09/29/21 1344   HYDROcodone-acetaminophen (NORCO/VICODIN) 5-325 MG per tablet 1 tablet  1 tablet Oral Q6H PRN Regalado, Belkys A, MD       labetalol (NORMODYNE) injection 5 mg  5 mg Intravenous Q2H PRN Tu, Ching T,  DO       levETIRAcetam (KEPPRA) tablet 750 mg  750 mg Oral BID Tu, Ching T, DO   750 mg at 09/29/21 0913   loratadine (CLARITIN) tablet 10 mg  10 mg Oral Daily Tu, Ching T, DO   10 mg at 09/29/21 7737   multivitamin with minerals tablet 1 tablet  1 tablet Oral Daily Regalado, Belkys A, MD   1 tablet at 09/29/21 3668   Oral care mouth rinse  15 mL Mouth Rinse PRN Tu, Ching T, DO       pantoprazole (PROTONIX) EC tablet 40 mg  40 mg Oral BID Regalado, Belkys A, MD   40 mg at 09/29/21 0904   sertraline (ZOLOFT) tablet 100 mg  100 mg Oral QHS Tu, Ching T, DO   100 mg at 09/28/21 2217     Discharge Medications: Please see discharge summary for a list of discharge medications.  Relevant Imaging Results:  Relevant Lab Results:   Additional Information SSN: [id number]  Roseanne Kaufman, RN

## 2021-09-29 NOTE — Progress Notes (Signed)
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  Patient was just recently discharged on 09/23/2021, at that time CT showed progression of her tumor with edema and mass effect.  Oncology Dr. Mickeal Skinner was consulted and recommended IV Decadron and transition to 2 mg daily.  She has been requiring a walker for ambulation since discharge.  She has also been sleeping more.   Assessment & Plan:   Principal Problem:   Brain tumor ([place]) Active Problems:   Pleomorphic xanthoastrocytoma ([place])   Hyperlipidemia   Seizure disorder ([place])   HTN (hypertension)   Obesity (BMI 30-39.9)  1-Pleomorphic Xanthoastrocytoma -Status post temporal craniotomy, resection 2015 with progression of disease status post cycle craniotomy with subtotal resection and radiation. -09/23/2021: CT head showed progression of her tumor with edema mass effect.  Decadron dose increased.  -Received loading dose of 10 mg IV Decadron in the ED.  She was on 2 mg decadron daily. -Dr. Mickeal Skinner, review Ms. Caples MRI and case at the tumor board on Monday to determine future treatment options. -He recommended to increase Decadron to 4 mg twice daily.  Her symptoms might be explaining to rapid taper of steroid. -MRI with worsening finding, discussed with Dr Mickeal Skinner, plan to continue with IV steroids. Plan to discuss case at Tumor board.  -Repeated CT head this am due to AMS. CT finding stable from recent MRI.  -she is more alert today, oriented to person, place, still confuse.   2-Acute Metabolic Encephalopathy;  Multifactorial cereal edema,  astrocytoma, Steroids.  She was sleepy 8/13, suspect related to Haldol, ativan, but CT head ordered.  CT head stable finding compare to MRI from yesterday.   Hypertension; Started  Norvasc. Increase dose to control BP  Seizure disorder Continue with Keppra.   Hyperlipidemia Continue with lipitor.   Obesity; Life style modification.   Hypokalemia; replete orally.   Estimated body mass index is 32.01 kg/m as calculated from the following:   Height as of this encounter: '5\' 2"'$  (1.575 m).   Weight as of this encounter: 79.4 kg.   DVT prophylaxis: SCD Code Status: Full code Family Communication: Care discussed with patient. Brother over phone 8/13 Disposition Plan:  Status is: Observation The patient will require care spanning > 2 midnights and should be moved to inpatient because: management of confusion . Needs safe discharge plan. Awaiting Dr Mickeal Skinner recommendations.     Consultants:  Dr Mickeal Skinner  Procedures:    Antimicrobials:    Subjective: She is alert today, follows command. She is alert to place, time situation, still confuse.    Objective: Vitals:   09/28/21 2217 09/28/21 2300 09/29/21 0634 09/29/21 0904  BP: (!) 147/81  (!) 157/83 (!) 162/82  Pulse: 73  67   Resp: '18 15 20   '$ Temp: 97.6 F (36.4 C)  98.1 F (36.7 C)   TempSrc: Oral  Oral   SpO2: 96%  99%   Weight:      Height:        Intake/Output Summary (Last 24 hours) at 09/29/2021 1300 Last data filed at 09/29/2021 1141 Gross per 24 hour  Intake 540 ml  Output 0 ml  Net 540 ml    Filed  Weights   09/26/21 1840  Weight: 79.4 kg    Examination:  General exam: NAD Respiratory system: CTA Cardiovascular system: S 1, S 2 RRR Gastrointestinal system: BS present, soft, nt Central nervous system: alert, follows command Extremities: no edema    Data Reviewed: I have personally reviewed following labs and imaging studies  CBC: Recent Labs  Lab 09/23/21 0511 09/26/21 1900 09/28/21 0749  09/29/21 0345  WBC 9.8 8.6 9.9 10.4  HGB 15.0 14.5 13.1 12.8  HCT 42.6 41.2 37.3 37.1  MCV 89.9 90.0 89.7 90.9  PLT 316 279 257 322    Basic Metabolic Panel: Recent Labs  Lab 09/23/21 0511 09/26/21 1900 09/28/21 0749 09/29/21 0345  NA 138 140 140 138  K 3.3* 3.9 3.3* 4.1  CL 104 107 108 107  CO2 '23 23 23 23  '$ GLUCOSE 117* 127* 119* 157*  BUN '17 11 15 21  '$ CREATININE 0.58 0.65 0.73 0.66  CALCIUM 9.6 9.4 9.2 8.9  MG  --   --   --  2.2    GFR: Estimated Creatinine Clearance: 66.6 mL/min (by C-G formula based on SCr of 0.66 mg/dL). Liver Function Tests: Recent Labs  Lab 09/23/21 0511  AST 16  ALT 18  ALKPHOS 68  BILITOT 1.1  PROT 6.8  ALBUMIN 4.0    No results for input(s): "LIPASE", "AMYLASE" in the last 168 hours. No results for input(s): "AMMONIA" in the last 168 hours. Coagulation Profile: No results for input(s): "INR", "PROTIME" in the last 168 hours. Cardiac Enzymes: No results for input(s): "CKTOTAL", "CKMB", "CKMBINDEX", "TROPONINI" in the last 168 hours. BNP (last 3 results) No results for input(s): "PROBNP" in the last 8760 hours. HbA1C: No results for input(s): "HGBA1C" in the last 72 hours. CBG: Recent Labs  Lab 09/26/21 1850  GLUCAP 125*    Lipid Profile: No results for input(s): "CHOL", "HDL", "LDLCALC", "TRIG", "CHOLHDL", "LDLDIRECT" in the last 72 hours. Thyroid Function Tests: No results for input(s): "TSH", "T4TOTAL", "FREET4", "T3FREE", "THYROIDAB" in the last 72 hours. Anemia Panel: No results for input(s): "VITAMINB12", "FOLATE", "FERRITIN", "TIBC", "IRON", "RETICCTPCT" in the last 72 hours. Sepsis Labs: No results for input(s): "PROCALCITON", "LATICACIDVEN" in the last 168 hours.  No results found for this or any previous visit (from the past 240 hour(s)).       Radiology Studies: CT HEAD WO CONTRAST (5MM)  Result Date: 09/28/2021 CLINICAL DATA:  67 year old female with history of PXA, progression. EXAM: CT HEAD WITHOUT  CONTRAST TECHNIQUE: Contiguous axial images were obtained from the base of the skull through the vertex without intravenous contrast. RADIATION DOSE REDUCTION: This exam was performed according to the departmental dose-optimization program which includes automated exposure control, adjustment of the mA and/or kV according to patient size and/or use of iterative reconstruction technique. COMPARISON:  Restaging brain MRI yesterday, and earlier. FINDINGS: Brain: Intracranial mass effect with leftward midline shift up to 10 mm stable from the MRI yesterday. Right temporal lobe cystic resection cavity with abundant right hemisphere edema, and edema tracking into the right brainstem (better demonstrated by MRI) appears stable since the CT 09/22/2021. Basilar cisterns remain patent, and the suprasellar cistern appears slightly improved from the recent CT and MRI. No acute intracranial hemorrhage identified. Effaced right lateral ventricle with mildly enlarged left lateral ventricle but no convincing transependymal edema. No cortically based acute infarct identified. Vascular: No suspicious intracranial vascular hyperdensity. Skull: Stable right side craniotomy. Sinuses/Orbits: Visualized paranasal sinuses and mastoids are clear. Other: No acute orbit or scalp  soft tissue finding. IMPRESSION: 1. Stable compared to restaging Brain MRI yesterday. Abundant right hemisphere edema tracking into the brainstem and associated with intracranial mass effect, midline shift up to 10 mm. Mildly enlarged left lateral ventricle but no convincing transependymal edema. 2. No new intracranial abnormality. Electronically Signed   By: Genevie Ann M.D.   On: 09/28/2021 10:47        Scheduled Meds:  [START ON 09/30/2021] amLODipine  10 mg Oral Daily   atorvastatin  20 mg Oral Daily   cholecalciferol  1,000 Units Oral Daily   dexamethasone (DECADRON) injection  4 mg Intravenous Q12H   feeding supplement  237 mL Oral BID BM   levETIRAcetam   750 mg Oral BID   loratadine  10 mg Oral Daily   multivitamin with minerals  1 tablet Oral Daily   pantoprazole  40 mg Oral BID   sertraline  100 mg Oral QHS   Continuous Infusions:   LOS: 2 days    Time spent: 35 minutes.    Elmarie Shiley, MD [facility]   If 7PM-7AM, please contact night-coverage [email or web address]  09/29/2021, 1:00 PM

## 2021-09-29 NOTE — Plan of Care (Signed)
  Problem: Education: Goal: Knowledge of General Education information will improve Description Including pain rating scale, medication(s)/side effects and non-pharmacologic comfort measures Outcome: Progressing   Problem: Health Behavior/Discharge Planning: Goal: Ability to manage health-related needs will improve Outcome: Progressing   

## 2021-09-29 NOTE — Progress Notes (Signed)
Chaplain checked in with nurse about Lindsey Daniels's ability to complete a Healthcare POA document after noticing that [facility] asked some questions repeatedly concerning the completion of the Advanced Directive.  Nurse voiced that Lindsey Daniels would not be appropriate to sign anything at this time.  Chaplain agrees with that assessment.   Chaplain will follow-up if family or friends have any questions.    09/29/21 1000  Clinical Encounter Type  Visited With Patient  Visit Type Initial;Social support  Referral From Patient  Consult/Referral To Chaplain  Spiritual Encounters  Spiritual Needs Literature;Brochure

## 2021-09-29 NOTE — TOC Initial Note (Signed)
Transition of Care [facility]) - Initial/Assessment Note    Patient Details  Name: Lindsey Daniels MRN: [id number] Date of Birth: [birth year]  Transition of Care [facility]) CM/SW Contact:    Roseanne Kaufman, RN Phone Number: 09/29/2021, 2:33 PM  Clinical Narrative:  RNCM spoke with patient's brother Juanda Crumble to explain SNF placement process. This RNCM initiated PASRR however awaiting a response, will initiate SNF placement process.  TOC will continue to follow.                Expected Discharge Plan: Skilled Nursing Facility Barriers to Discharge: Continued Medical Work up   Patient Goals and CMS Choice        Expected Discharge Plan and Services Expected Discharge Plan: [place] In-house Referral: NA Discharge Planning Services: CM Consult Post Acute Care Choice: [place]                       Prior Living Arrangements/Services   Lives with:: Self Patient language and need for interpreter reviewed:: Yes Do you feel safe going back to the place where you live?: Yes      Need for Family Participation in Patient Care: Yes (Comment) Care giver support system in place?: Yes (comment)   Criminal Activity/Legal Involvement Pertinent to Current Situation/Hospitalization: No - Comment as needed  Activities of Daily Living Home Assistive Devices/Equipment: Walker (specify type), Bedside commode/3-in-1 ADL Screening (condition at time of admission) Patient's cognitive ability adequate to safely complete daily activities?: Yes Is the patient deaf or have difficulty hearing?: No Does the patient have difficulty seeing, even when wearing glasses/contacts?: No Does the patient have difficulty concentrating, remembering, or making decisions?: Yes Patient able to express need for assistance with ADLs?: Yes Does the patient have difficulty dressing or bathing?: No Independently performs ADLs?: Yes (appropriate for developmental age) Does the patient have  difficulty walking or climbing stairs?: No Weakness of Legs: None Weakness of Arms/Hands: None  Permission Sought/Granted Permission sought to share information with : Case Manager Permission granted to share information with : Yes, Verbal Permission Granted  Share Information with NAME: Case Manager           Emotional Assessment Appearance:: Appears stated age Attitude/Demeanor/Rapport: Gracious Affect (typically observed): Accepting Orientation: : Oriented to Self, Oriented to Place   Psych Involvement: No (comment)  Admission diagnosis:  Brain tumor ([place]) [D49.6] Gait instability [R26.81] Failure to thrive in adult [R62.7] Uncontrolled hypertension [I10] Generalized weakness [R53.1] Patient Active Problem List   Diagnosis Date Noted   Obesity (BMI 30-39.9) 09/27/2021   Brain tumor ([place]) 09/27/2021   Headache 24/26/8341   Acute metabolic encephalopathy 96/22/2979   Weakness of both lower extremities 09/22/2021   HTN (hypertension) 09/22/2021   Focal seizures ([place]) 07/15/2021   Depression 07/15/2021   Protein-calorie malnutrition, severe 11/28/2020   Cerebral edema ([place]) 11/25/2020   Increased body mass index 05/09/2019   Seasonal allergies 11/21/2016   Loss of height 11/21/2016   Elevated BP without diagnosis of hypertension 11/21/2016   Exposure to TB 06/07/2015   HSV infection 06/07/2015   Depressed mood 06/07/2015   Carpal tunnel syndrome 03/06/2014   Pleomorphic xanthoastrocytoma ([place]) 10/02/2013   Syncope 08/18/2013   Seizure disorder ([place]) 08/18/2013   History of benign neoplasm of brain 02/16/2013   Bilateral leg pain 09/07/2012   Foot pain, bilateral 09/07/2012   Plantar fasciitis BILATERAL 08/25/2012   Hyperlipidemia 12/24/2011   PCP:  Harrison Mons, PA Pharmacy:   [facility]  [address], [address] [place] [address] Phone: [phone] Fax: [phone]     Social Determinants of Health  (SDOH) Interventions    Readmission Risk Interventions     No data to display

## 2021-09-29 NOTE — Progress Notes (Signed)
Chaplain engaged in an initial visit with [facility].  Chaplain provided education on how to complete an Scientist, physiological, Healthcare POA.  Chaplain went through each section and let her know how to reach her when she is ready to complete it.  Josselyn wants to assign her brother, [place], and a dear friend as her healthcare agents.  She needs to have some conversations with them before completing the document.   Chaplain will follow-up with [facility] about completing the document.     09/29/21 1000  Clinical Encounter Type  Visited With Patient  Visit Type Initial;Social support  Referral From Patient  Consult/Referral To Chaplain  Spiritual Encounters  Spiritual Needs Literature;Brochure

## 2021-09-29 NOTE — Progress Notes (Signed)
Occupational Therapy Treatment Patient Details Name: Lindsey Daniels MRN: [id number] DOB: 14-Apr-1955 Today's Date: 09/29/2021   History of present illness Patient is a 66 y.o. 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 Patient was just recently discharged on 09/23/2021, at that time CT showed progression of her tumor with edema and mass effect.  Oncology Dr. Mickeal Skinner was consulted and recommended IV Decadron and transition to 2 mg daily.  She has been requiring a walker for ambulation since discharge.  She has also been sleeping more.   OT comments  Patient continues to require safety for all ADLs with patient quickly distracted and needs increased cues for sequencing of task. Patient is not at level to transition home alone at this time and would benefit from short term rehab to increase independence and safety in next level of care.d/c recommendations were updated at this time. OT to continue to follow.    Recommendations for follow up therapy are one component of a multi-disciplinary discharge planning process, led by the attending physician.  Recommendations may be updated based on patient status, additional functional criteria and insurance authorization.    Follow Up Recommendations  Skilled nursing-short term rehab (<3 hours/day)    Assistance Recommended at Discharge Frequent or constant Supervision/Assistance  Patient can return home with the following  A little help with walking and/or transfers;A little help with bathing/dressing/bathroom;Assist for transportation;Assistance with cooking/housework;Direct supervision/assist for financial management;Direct supervision/assist for medications management   Equipment Recommendations  None  recommended by OT    Recommendations for Other Services      Precautions / Restrictions Precautions Precautions: Fall Precaution Comments: Monitor HR Restrictions Weight Bearing Restrictions: No       Mobility Bed Mobility Overal bed mobility: Needs Assistance Bed Mobility: Supine to Sit, Sit to Supine     Supine to sit: Supervision, HOB elevated Sit to supine: Supervision, HOB elevated        Transfers                         Balance Overall balance assessment: Needs assistance Sitting-balance support: Feet supported Sitting balance-Leahy Scale: Good     Standing balance support: Bilateral upper extremity supported, Single extremity supported, During functional activity, Reliant on assistive device for balance Standing balance-Leahy Scale: Poor Standing balance comment: pt reliant on external support of RW or therapist                           ADL either performed or assessed with clinical judgement   ADL Overall ADL's : Needs assistance/impaired                     Lower Body Dressing: Sit to/from stand;Sitting/lateral leans;Min guard Lower Body Dressing Details (indicate cue type and reason): don/doff socks seated Toilet Transfer: Rolling walker (2 wheels);Cueing for sequencing;Min guard Toilet Transfer Details (indicate cue type and reason): with increased time and cues to keep RW on the floor during turns. Toileting- Water quality scientist and Hygiene: Min guard;Sit to/from stand;Cueing for safety         General ADL Comments: patient remains at level that would need 24/7 caregiver support that is not available at this time at home per patient and brother report.    Extremity/Trunk Assessment  Vision       Perception     Praxis      Cognition Arousal/Alertness: Awake/alert Behavior During Therapy: Flat affect, WFL for tasks assessed/performed Overall Cognitive Status: Impaired/Different from  baseline                       Memory: Decreased short-term memory   Safety/Judgement: Decreased awareness of deficits, Decreased awareness of safety   Problem Solving: Requires verbal cues          Exercises      Shoulder Instructions       General Comments      Pertinent Vitals/ Pain       Pain Assessment Pain Assessment: Faces Faces Pain Scale: Hurts a little bit Pain Location: legs Pain Intervention(s): Monitored during session  Home Living                                          Prior Functioning/Environment              Frequency  Min 2X/week        Progress Toward Goals  OT Goals(current goals can now be found in the care plan section)  Progress towards OT goals: Progressing toward goals     Plan Discharge plan needs to be updated    Co-evaluation                 AM-PAC OT "6 Clicks" Daily Activity     Outcome Measure   Help from another person eating meals?: None Help from another person taking care of personal grooming?: A Little Help from another person toileting, which includes using toliet, bedpan, or urinal?: A Little Help from another person bathing (including washing, rinsing, drying)?: A Little Help from another person to put on and taking off regular upper body clothing?: A Little Help from another person to put on and taking off regular lower body clothing?: A Little 6 Click Score: 19    End of Session Equipment Utilized During Treatment: Rolling walker (2 wheels)  OT Visit Diagnosis: Other symptoms and signs involving cognitive function   Activity Tolerance Patient limited by fatigue   Patient Left in bed;with call bell/phone within reach;with family/visitor present   Nurse Communication          Time: 1441-1455 OT Time Calculation (min): 14 min  Charges: OT General Charges $OT Visit: 1 Visit OT Treatments $Self Care/Home Management : 8-22 mins  Jackelyn Poling OTR/L, MS Acute  Rehabilitation Department Office# [phone] Pager# [phone]   Marcellina Millin 09/29/2021, 3:15 PM

## 2021-09-30 DIAGNOSIS — D496 Neoplasm of unspecified behavior of brain: Secondary | ICD-10-CM | POA: Diagnosis not present

## 2021-09-30 MED ORDER — LOSARTAN POTASSIUM 50 MG PO TABS
50.0000 mg | ORAL_TABLET | Freq: Every day | ORAL | Status: DC
  Administered 2021-09-30 – 2021-10-06 (×7): 50 mg via ORAL
  Filled 2021-09-30 (×7): qty 1

## 2021-09-30 NOTE — Plan of Care (Signed)
  Problem: Education: Goal: Knowledge of General Education information will improve Description Including pain rating scale, medication(s)/side effects and non-pharmacologic comfort measures Outcome: Progressing   Problem: Health Behavior/Discharge Planning: Goal: Ability to manage health-related needs will improve Outcome: Progressing   

## 2021-09-30 NOTE — Progress Notes (Signed)
Physical Therapy Treatment Patient Details Name: Lindsey Daniels MRN: [id number] DOB: 08-Jun-1955 Today's Date: 09/30/2021   History of Present Illness Patient is a 66 y.o. 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 Patient was just recently discharged on 09/23/2021, at that time CT showed progression of her tumor with edema and mass effect.  Oncology Dr. Mickeal Skinner was consulted and recommended IV Decadron and transition to 2 mg daily.  She has been requiring a walker for ambulation since discharge.  She has also been sleeping more.    PT Comments    Pt progressing toward PT goals. Pt is beginning to demonstrate improved safety awareness, dynamic balance improving this session. Pt continues to demonstrate higher level balance deficits, decr attention placing her at risk for falls. Recommend SNF post acute.    Recommendations for follow up therapy are one component of a multi-disciplinary discharge planning process, led by the attending physician.  Recommendations may be updated based on patient status, additional functional criteria and insurance authorization.  Follow Up Recommendations  Skilled nursing-short term rehab (<3 hours/day) Can patient physically be transported by private vehicle: Yes   Assistance Recommended at Discharge Frequent or constant Supervision/Assistance  Patient can return home with the following A little help with walking and/or transfers;A little help with bathing/dressing/bathroom;Assistance with cooking/housework;Direct supervision/assist for medications management;Assist for transportation;Help with stairs or ramp for entrance   Equipment Recommendations  None recommended by PT    Recommendations for Other Services        Precautions / Restrictions Precautions Precautions: Fall Precaution Comments: Monitor HR Restrictions Weight Bearing Restrictions: No     Mobility  Bed Mobility Overal bed mobility: Needs Assistance Bed Mobility: Supine to Sit, Sit to Supine     Supine to sit: Supervision Sit to supine: Supervision   General bed mobility comments: Supv for safety. Increased time.    Transfers Overall transfer level: Needs assistance Equipment used: Rolling walker (2 wheels), None Transfers: Sit to/from Stand Sit to Stand: Supervision, Min guard           General transfer comment: Min guard for safety. Cues for safety awareness--> backing up and reaching back to surface for descent    Ambulation/Gait Ambulation/Gait assistance: Min guard Gait Distance (Feet): 160 Feet (+ 80) Assistive device: Rolling walker (2 wheels), None Gait Pattern/deviations: Step-through pattern, Decreased stride length       General Gait Details: walked ~160 feet with a RW-cues for safety awareness, RW proximity, directional cues and to keep feet inside RW; intermittent unsteadiness requiring light  assist to stabilize. walked 80 feet wtihout a device- Min to min/guard  to stabilize adn intermittent HHA   Stairs             Wheelchair Mobility    Modified Rankin (Stroke Patients Only)       Balance Overall balance assessment: Needs assistance Sitting-balance support: Feet supported Sitting balance-Leahy Scale: Good     Standing balance support: Bilateral upper extremity supported, No upper extremity supported, During functional activity Standing balance-Leahy Scale: Fair               High level balance activites: Side stepping, Sudden stops, Head turns, Turns, Backward walking (tandem gait) High Level Balance Comments: min/guard for safety            Cognition Arousal/Alertness: Awake/alert  Behavior During Therapy: [facility] for tasks assessed/performed                        Current Attention Level: Selective   Following Commands: Follows one step commands with increased time, Follows multi-step commands with increased time Safety/Judgement: Decreased awareness of deficits, Decreased awareness of safety     General Comments: pt verbalizes deficits regarding balance, gait safety        Exercises      General Comments        Pertinent Vitals/Pain Pain Assessment Pain Assessment: No/denies pain    Home Living                          Prior Function            PT Goals (current goals can now be found in the care plan section) Acute Rehab PT Goals Patient Stated Goal: stay independent at home PT Goal Formulation: With patient Time For Goal Achievement: 10/07/21 Potential to Achieve Goals: Good Progress towards PT goals: Progressing toward goals    Frequency    Min 3X/week      PT Plan Discharge plan needs to be updated    Co-evaluation              AM-PAC PT "6 Clicks" Mobility   Outcome Measure  Help needed turning from your back to your side while in a flat bed without using bedrails?: A Little Help needed moving from lying on your back to sitting on the side of a flat bed without using bedrails?: A Little Help needed moving to and from a bed to a chair (including a wheelchair)?: A Little Help needed standing up from a chair using your arms (e.g., wheelchair or bedside chair)?: A Little Help needed to walk in hospital room?: A Little Help needed climbing 3-5 steps with a railing? : A Lot 6 Click Score: 17    End of Session Equipment Utilized During Treatment: Gait belt Activity Tolerance: Patient tolerated treatment well Patient left: in bed;with call bell/phone within reach;with bed alarm set;with family/visitor present Nurse Communication: Mobility status PT Visit Diagnosis: Unsteadiness on feet (R26.81);Muscle weakness (generalized) (M62.81);Difficulty in walking, not elsewhere classified  (R26.2)     Time: 4801-6553 PT Time Calculation (min) (ACUTE ONLY): 23 min  Charges:  $Gait Training: 23-37 mins                     Baxter Flattery, PT  Acute Rehab Dept [facility]) [phone]  [facility] Weekend Pager [facility] only)  [phone]  09/30/2021    [facility] 09/30/2021, 4:37 PM

## 2021-09-30 NOTE — Progress Notes (Signed)
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  Patient was just recently discharged on 09/23/2021, at that time CT showed progression of her tumor with edema and mass effect.  Oncology Dr. Mickeal Skinner was consulted and recommended IV Decadron and transition to 2 mg daily.  She has been requiring a walker for ambulation since discharge.  She has also been sleeping more.  Patient was admitted with worsening confusion and lethargy.  MRI with worsening cerebral edema.  Case was discussed with Dr. Mickeal Skinner who recommended increase steroids to 4 mg IV twice daily.  Patient will need to be discharged on 4 mg twice daily until follow-up with Dr. Mickeal Skinner.  MRI results was discussed at the tumor board, findings likely reflect inflammation rather than progression of malignancy.  Patient to be discharged higher dose Decadron and follow-up with Dr. Mickeal Skinner.  If no improvement on outpatient follow-up Dr. Mickeal Skinner will consider Avastin. Patient awaiting rehab.   Assessment & Plan:   Principal Problem:   Brain tumor ([place]) Active Problems:   Pleomorphic xanthoastrocytoma ([place])   Hyperlipidemia   Seizure disorder ([place])   HTN (hypertension)   Obesity (BMI 30-39.9)  1-Pleomorphic Xanthoastrocytoma -Status post temporal craniotomy, resection 2015 with progression of disease status post cycle craniotomy with subtotal resection and radiation. -09/22/2021: CT head showed progression of her tumor with edema mass effect.  Decadron dose increased. Discharge on 2 mg decadron daily, discharge on 8/08. -Readmitted 8/11 with lethargic,  confusion.  -Received loading dose of 10 mg IV Decadron in the ED.   -Dr. Mickeal Skinner, reviewed  Ms. Oliger MRI at tumor borad, MRI finding likely reflects inflammation rather than progression of cancer.  -He recommended to increase Decadron to 4 mg twice daily.  -Repeated CT head 8/13 due to AMS. CT finding stable from recent MRI.  -She is more alert, improving.  She will benefit form Rehab, supervision, hopefully symptoms will improve with Decadron.   2-Acute Metabolic Encephalopathy;  Multifactorial cereal edema, astrocytoma, Steroids.  She was sleepy 8/13, suspect related to Haldol, ativan, but CT head ordered.  CT head stable finding compare to MRI from 8/12  Hypertension;  Increase norvasc to 10 mg Start cozaar.    Seizure disorder Continue with Keppra.   Hyperlipidemia Continue with lipitor.   Obesity; Life style modification.   Hypokalemia; Replaced.   Estimated body mass index is 32.01 kg/m as calculated from the following:   Height as of this encounter: '5\' 2"'$  (1.575 m).   Weight as of this encounter: 79.4 kg.   DVT prophylaxis: SCD Code Status: Full code Family Communication: Care discussed with patient. Brother over phone 8/14 Disposition Plan:  Status is: Observation The patient will require care spanning > 2 midnights and should be moved to inpatient because: management of confusion . Needs safe discharge plan. Hopefully to Rehab.    Consultants:  Dr Mickeal Skinner  Procedures:    Antimicrobials:   Subjective She is alert, less confuse per nurse report.   Objective: Vitals:   09/29/21 1316 09/29/21 2032 09/30/21 0633 09/30/21 0642  BP: (!) 147/90 (!) 147/70 (!) 169/115 (!) 164/94  Pulse: 73 75 75   Resp: '20 16 16 13  '$ Temp: 98 F (  36.7 C) 98 F (36.7 C) 98.4 F (36.9 C)   TempSrc: Oral Oral Oral   SpO2: 97% 92% 96%   Weight:      Height:        Intake/Output Summary (Last 24 hours) at 09/30/2021 0751 Last data filed at 09/29/2021 1141 Gross per 24  hour  Intake 60 ml  Output --  Net 60 ml    Filed Weights   09/26/21 1840  Weight: 79.4 kg    Examination:  General exam: NAD Respiratory system: CTA Cardiovascular system: S 1, S 2 RRR Gastrointestinal system: BS present, soft,nt Central nervous system: Alert, follows command Extremities: no edema    Data Reviewed: I have personally reviewed following labs and imaging studies  CBC: Recent Labs  Lab 09/26/21 1900 09/28/21 0749 09/29/21 0345  WBC 8.6 9.9 10.4  HGB 14.5 13.1 12.8  HCT 41.2 37.3 37.1  MCV 90.0 89.7 90.9  PLT 279 257 621    Basic Metabolic Panel: Recent Labs  Lab 09/26/21 1900 09/28/21 0749 09/29/21 0345  NA 140 140 138  K 3.9 3.3* 4.1  CL 107 108 107  CO2 '23 23 23  '$ GLUCOSE 127* 119* 157*  BUN '11 15 21  '$ CREATININE 0.65 0.73 0.66  CALCIUM 9.4 9.2 8.9  MG  --   --  2.2    GFR: Estimated Creatinine Clearance: 66.6 mL/min (by C-G formula based on SCr of 0.66 mg/dL). Liver Function Tests: No results for input(s): "AST", "ALT", "ALKPHOS", "BILITOT", "PROT", "ALBUMIN" in the last 168 hours.  No results for input(s): "LIPASE", "AMYLASE" in the last 168 hours. No results for input(s): "AMMONIA" in the last 168 hours. Coagulation Profile: No results for input(s): "INR", "PROTIME" in the last 168 hours. Cardiac Enzymes: No results for input(s): "CKTOTAL", "CKMB", "CKMBINDEX", "TROPONINI" in the last 168 hours. BNP (last 3 results) No results for input(s): "PROBNP" in the last 8760 hours. HbA1C: No results for input(s): "HGBA1C" in the last 72 hours. CBG: Recent Labs  Lab 09/26/21 1850  GLUCAP 125*    Lipid Profile: No results for input(s): "CHOL", "HDL", "LDLCALC", "TRIG", "CHOLHDL", "LDLDIRECT" in the last 72 hours. Thyroid Function Tests: No results for input(s): "TSH", "T4TOTAL", "FREET4", "T3FREE", "THYROIDAB" in the last 72 hours. Anemia Panel: No results for input(s): "VITAMINB12", "FOLATE", "FERRITIN", "TIBC", "IRON",  "RETICCTPCT" in the last 72 hours. Sepsis Labs: No results for input(s): "PROCALCITON", "LATICACIDVEN" in the last 168 hours.  No results found for this or any previous visit (from the past 240 hour(s)).       Radiology Studies: CT HEAD WO CONTRAST (5MM)  Result Date: 09/28/2021 CLINICAL DATA:  66 year old female with history of PXA, progression. EXAM: CT HEAD WITHOUT CONTRAST TECHNIQUE: Contiguous axial images were obtained from the base of the skull through the vertex without intravenous contrast. RADIATION DOSE REDUCTION: This exam was performed according to the departmental dose-optimization program which includes automated exposure control, adjustment of the mA and/or kV according to patient size and/or use of iterative reconstruction technique. COMPARISON:  Restaging brain MRI yesterday, and earlier. FINDINGS: Brain: Intracranial mass effect with leftward midline shift up to 10 mm stable from the MRI yesterday. Right temporal lobe cystic resection cavity with abundant right hemisphere edema, and edema tracking into the right brainstem (better demonstrated by MRI) appears stable since the CT 09/22/2021. Basilar cisterns remain patent, and the suprasellar cistern appears slightly improved from the recent CT and MRI. No acute intracranial hemorrhage identified. Effaced right lateral ventricle with mildly enlarged  left lateral ventricle but no convincing transependymal edema. No cortically based acute infarct identified. Vascular: No suspicious intracranial vascular hyperdensity. Skull: Stable right side craniotomy. Sinuses/Orbits: Visualized paranasal sinuses and mastoids are clear. Other: No acute orbit or scalp soft tissue finding. IMPRESSION: 1. Stable compared to restaging Brain MRI yesterday. Abundant right hemisphere edema tracking into the brainstem and associated with intracranial mass effect, midline shift up to 10 mm. Mildly enlarged left lateral ventricle but no convincing transependymal  edema. 2. No new intracranial abnormality. Electronically Signed   By: Genevie Ann M.D.   On: 09/28/2021 10:47        Scheduled Meds:  amLODipine  10 mg Oral Daily   atorvastatin  20 mg Oral Daily   cholecalciferol  1,000 Units Oral Daily   dexamethasone (DECADRON) injection  4 mg Intravenous Q12H   feeding supplement  237 mL Oral BID BM   levETIRAcetam  750 mg Oral BID   loratadine  10 mg Oral Daily   multivitamin with minerals  1 tablet Oral Daily   pantoprazole  40 mg Oral BID   sertraline  100 mg Oral QHS   Continuous Infusions:   LOS: 3 days    Time spent: 35 minutes.    Elmarie Shiley, MD [facility]   If 7PM-7AM, please contact night-coverage [email or web address]  09/30/2021, 7:51 AM

## 2021-09-30 NOTE — TOC Progression Note (Addendum)
Transition of Care [facility]) - Progression Note    Patient Details  Name: Lindsey Daniels MRN: [id number] Date of Birth: [birth year]  Transition of Care [facility]) CM/SW [place], RN Phone Number: 09/30/2021, 10:02 AM  Clinical Narrative:  Provided bed offers to patient's brother Gwynneth Macleod) Juanda Crumble, left a copy in patient's room. Awaiting choice, to initiate insurance auth. Patient's brother is on his way to the hospital and will call this RNCM when he has made a decision.    - 4:24p RNCM spoke with patient's brother who has concerns about COVID within SNF. Patient's brother has visited SNF and wants to discuss further with the MD. Gwynneth Macleod will give this RNCM a call in the am.  TOC will continue to follow.      Expected Discharge Plan: [place] Barriers to Discharge: Continued Medical Work up  Expected Discharge Plan and Services Expected Discharge Plan: [place] In-house Referral: NA Discharge Planning Services: CM Consult Post Acute Care Choice: [place] Living arrangements for the past 2 months: Single Family Home                   Social Determinants of Health (SDOH) Interventions    Readmission Risk Interventions     No data to display

## 2021-10-01 DIAGNOSIS — R627 Adult failure to thrive: Secondary | ICD-10-CM | POA: Diagnosis not present

## 2021-10-01 DIAGNOSIS — R2681 Unsteadiness on feet: Secondary | ICD-10-CM | POA: Diagnosis not present

## 2021-10-01 DIAGNOSIS — R531 Weakness: Secondary | ICD-10-CM

## 2021-10-01 DIAGNOSIS — I10 Essential (primary) hypertension: Secondary | ICD-10-CM

## 2021-10-01 DIAGNOSIS — D496 Neoplasm of unspecified behavior of brain: Secondary | ICD-10-CM | POA: Diagnosis not present

## 2021-10-01 NOTE — Progress Notes (Signed)
[facility]  PROGRESS NOTE  Tenita Cue YIF:[id number] DOB: 08-Mar-1955 DOA: 09/26/2021 PCP: Harrison Mons, PA   Brief HPI:   66 year old female with past medical history of right temporal pleomorphic xanthoastrocytoma s/p temporal craniotomy, resection in 2015 with progression of disease s/p second craniotomy with subtotal resection and radiation, seizure disorder and hypertension presented with worsening weakness and fatigue. Patient was just recently discharged on 09/23/2021, at that time CT showed progression of her tumor with edema and mass effect.  Oncology Dr. Mickeal Skinner was consulted and recommended IV Decadron and transition to 2 mg daily Patient was admitted with worsening confusion and lethargy.  MRI brain showed worsening cerebral edema.  Case was discussed with Dr. Mickeal Skinner who recommended to increase steroids to 4 mg IV twice daily.  Patient will need to be discharged on 4 mg twice daily and follow-up with Dr. Mickeal Skinner as outpatient.  MRI results was discussed at the tumor board, findings likely reflect inflammation rather than progression of malignancy.  Patient to be discharged higher dose Decadron and follow-up with Dr. Mickeal Skinner.  If no improvement on outpatient follow-up Dr. Mickeal Skinner will consider Avastin. Patient awaiting rehab.   Subjective   Patient seen and examined, no new complaints.   Assessment/Plan:    Pleomorphic xanthoastrocytoma -S/p temporal craniotomy, resection in 2015 with progression of disease s/p craniotomy with subtotal resection and radiation -09/22/2021, CT head showed progression of tumor with edema and mass effect, Decadron dose was increased patient was discharged on Decadron 2 mg daily on 09/23/2021 -Patient was remitted on 09/26/2021 with lethargy and confusion -Patient received 1 dose of Decadron 10 mg IV in the ED -Dr. Mickeal Skinner, reviewed  Ms. Swartzlander MRI at tumor borad, MRI finding likely reflects inflammation rather than progression of cancer.  -He  recommended to increase Decadron to 4 mg twice daily.  -Repeated CT head 8/13 due to AMS. CT finding stable from recent MRI.   Acute metabolic encephalopathy -Multifactorial, cerebral edema, astrocytoma, steroids -CT head showed stable findings compared to MRI from 8/12  Hypertension -Blood pressure is stable -Continue Norvasc 10 mg daily -On Cozaar 50 mg daily  Seizure disorder -Continue Keppra  Hyperlipidemia -Continue Lipitor  Hypokalemia -Replete   Medications     amLODipine  10 mg Oral Daily   atorvastatin  20 mg Oral Daily   cholecalciferol  1,000 Units Oral Daily   dexamethasone (DECADRON) injection  4 mg Intravenous Q12H   feeding supplement  237 mL Oral BID BM   levETIRAcetam  750 mg Oral BID   loratadine  10 mg Oral Daily   losartan  50 mg Oral Daily   multivitamin with minerals  1 tablet Oral Daily   pantoprazole  40 mg Oral BID   sertraline  100 mg Oral QHS     Data Reviewed:   CBG:  Recent Labs  Lab 09/26/21 1850  GLUCAP 125*    SpO2: 97 %    Vitals:   09/30/21 1238 09/30/21 2112 10/01/21 0543 10/01/21 1227  BP: (!) 144/93 (!) 141/88 139/89 (!) 143/84  Pulse: 82 76 73 90  Resp: '18 16 15 20  '$ Temp: 98.4 F (36.9 C) 97.8 F (36.6 C) 98.2 F (36.8 C) 97.7 F (36.5 C)  TempSrc: Oral Oral Oral Oral  SpO2: 95% 97% 96% 97%  Weight:      Height:          Data Reviewed:  Basic Metabolic Panel: Recent Labs  Lab 09/26/21 1900 09/28/21 0749 09/29/21 0345  NA 140 140 138  K 3.9 3.3* 4.1  CL 107 108 107  CO2 '23 23 23  '$ GLUCOSE 127* 119* 157*  BUN '11 15 21  '$ CREATININE 0.65 0.73 0.66  CALCIUM 9.4 9.2 8.9  MG  --   --  2.2    CBC: Recent Labs  Lab 09/26/21 1900 09/28/21 0749 09/29/21 0345  WBC 8.6 9.9 10.4  HGB 14.5 13.1 12.8  HCT 41.2 37.3 37.1  MCV 90.0 89.7 90.9  PLT 279 257 249    LFT No results for input(s): "AST", "ALT", "ALKPHOS", "BILITOT", "PROT", "ALBUMIN" in the last 168 hours.   Antibiotics: Anti-infectives  (From admission, onward)    None        DVT prophylaxis: SCDs  Code Status: Full code  Family Communication: Discussed with patient's brother on phone   CONSULTS neurooncology   Objective    Physical Examination:   General-appears in no acute distress Heart-S1-S2, regular, no murmur auscultated Lungs-clear to auscultation bilaterally, no wheezing or crackles auscultated Abdomen-soft, nontender, no organomegaly Extremities-no edema in the lower extremities Neuro-alert, oriented x3, no focal deficit noted  Status is: Inpatient:          Oswald Hillock   [facility] If 7PM-7AM, please contact night-coverage at [email or web address], Office  [phone]   10/01/2021, 5:15 PM  LOS: 4 days

## 2021-10-01 NOTE — Progress Notes (Signed)
Mobility Specialist - Progress Note   Pre-mobility: 116bmp HR During mobility: 124bmp HR Post-mobility: 98bmp HR   10/01/21 1100  Mobility  Activity Ambulated with assistance in hallway  Level of Assistance Contact guard assist, steadying assist  Assistive Device Front wheel walker  Distance Ambulated (ft) 120 ft  Activity Response Tolerated well  $Mobility charge 1 Mobility   Pt was found in bed and agreeable to mobilize. Had no complaints while ambulating. Pt returned to bed with all necessities in reach. RN was notified of session.  Ferd Hibbs Mobility Specialist

## 2021-10-01 NOTE — Progress Notes (Signed)
Chaplain went over healthcare POA documents to check for readiness of notarization.  Documents are ready but Chaplain did not have notary at the time.  Chaplain will check-in later today to see availability of notary to complete document.      10/01/21 1400  Clinical Encounter Type  Visited With Patient and family together  Visit Type Follow-up;Social support  Referral From Patient  Consult/Referral To Chaplain

## 2021-10-01 NOTE — TOC Progression Note (Addendum)
Transition of Care [facility]) - Progression Note    Patient Details  Name: Lindsey Daniels MRN: [id number] Date of Birth: 02-17-1955  Transition of Care [facility]) CM/SW [place], RN Phone Number: 10/01/2021, 9:07 AM  Clinical Narrative:   RNCM spoke with [facility] re: COVID precautions. ( Per [place]: COVID pt are isolated, dedicated staff, incoming pt's are not roomed near COVID+ patients). This RNCM spoke with patient's brother Wes to explain and encourage him to call SNF with questions. Wes indicates he wants to speak with MD before he makes a decision.  - 11am: Patient and family chose [place], this RNCM notified Kia with SNF. Awaiting insurance auth.  - 3:08p Spoke with patient's brother Gwynneth Macleod to advise we are awaiting insurance authorization. This RNCM spoke with Kia at [facility], who advised still  awaiting auth.    TOC will continue to follow.    Expected Discharge Plan: [place] Barriers to Discharge: Continued Medical Work up  Expected Discharge Plan and Services Expected Discharge Plan: [place] In-house Referral: NA Discharge Planning Services: CM Consult Post Acute Care Choice: [place] Living arrangements for the past 2 months: Single Family Home                                       Social Determinants of Health (SDOH) Interventions    Readmission Risk Interventions     No data to display

## 2021-10-02 DIAGNOSIS — R531 Weakness: Secondary | ICD-10-CM | POA: Diagnosis not present

## 2021-10-02 DIAGNOSIS — R627 Adult failure to thrive: Secondary | ICD-10-CM | POA: Diagnosis not present

## 2021-10-02 DIAGNOSIS — R2681 Unsteadiness on feet: Secondary | ICD-10-CM | POA: Diagnosis not present

## 2021-10-02 DIAGNOSIS — D496 Neoplasm of unspecified behavior of brain: Secondary | ICD-10-CM | POA: Diagnosis not present

## 2021-10-02 NOTE — Progress Notes (Signed)
Physical Therapy Treatment Patient Details Name: Brennen Gardiner MRN: [id number] DOB: 1955/10/28 Today's Date: 10/02/2021   History of Present Illness Patient is a 66 y.o. 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 Patient was just recently discharged on 09/23/2021, at that time CT showed progression of her tumor with edema and mass effect.  Oncology Dr. Mickeal Skinner was consulted and recommended IV Decadron and transition to 2 mg daily.  She has been requiring a walker for ambulation since discharge.  She has also been sleeping more.    PT Comments    General Comments: AxO x 3 but present with intermittent attention deficit and delayed response time. Pleasant Lady.  Concerns for self safety awareness as well as balance/coordination impairements. General transfer comment: Pt self able to perform but present with both static and dynamic balance instability with poor/delayed self correction and use of hands to steady self.  Pt lacking "quick response" motor skills as well as impaired self righting posture to midline. General Gait Details: First amb without any AD to access true gait.  Pt was very unstable, present with both right/left as well as anterior drift reaching for furniture/walls/rails to steady self.  Pt also lacking self correction reaction responce as well as inability to "stop and go" with controll.  HIGH FALL RISK.  Second, amb with AD RW.  Although the walker did increase her ability to amb with improved stability, pt was still present with poor coordination self navigating walker around obsticles and through doorways.  Pt was dfiting right and unable to maintain a correct walker to self distance/position hitting her R foot on back walker leg.   Therapist had to correct 75% of time.  This prompted Therapist to perform a BERG balance test.  Pt only scored 16/56 indicating 100% FALL RISK. Pt will need ST Rehab at SNF to address mobility and functional decline prior to safely returning home.   Recommendations for follow up therapy are one component of a multi-disciplinary discharge planning process, led by the attending physician.  Recommendations may be updated based on patient status, additional functional criteria and insurance authorization.  Follow Up Recommendations  Skilled nursing-short term rehab (<3 hours/day) Can patient physically be transported by private vehicle: Yes   Assistance Recommended at Discharge Frequent or constant Supervision/Assistance  Patient can return home with the following A little help with walking and/or transfers;A little help with bathing/dressing/bathroom;Assistance with cooking/housework;Direct supervision/assist for medications management;Assist for transportation;Help with stairs or ramp for entrance   Equipment Recommendations  None recommended by PT    Recommendations for Other Services       Precautions / Restrictions Precautions Precautions: Fall Precaution Comments: Monitor HR Restrictions Weight Bearing Restrictions: No     Mobility  Bed Mobility Overal bed mobility: Needs Assistance Bed Mobility: Supine to Sit     Supine to sit: Supervision Sit to supine: Supervision   General bed mobility comments: pt self able but present with intermittent delayed reaction response and control.  Pt tends to "fall" back into bed.    Transfers Overall transfer level: Needs assistance Equipment used: Rolling walker (2 wheels), None Transfers: Sit to/from Stand, Bed to chair/wheelchair/BSC Sit to Stand: Supervision, Min guard   Step pivot transfers: Min guard, Min assist       General transfer comment: Pt self able to perform  but present with both static and dynamic balance  instability with poor/delayed self correction and use of hands to steady self.  Pt lacking "quick response" motor skills as well as impaired self righting posture to midline.    Ambulation/Gait Ambulation/Gait assistance: Min guard, Min assist Gait Distance (Feet): 45 Feet Assistive device: Rolling walker (2 wheels), None Gait Pattern/deviations: Step-through pattern, Decreased stride length, Staggering left, Staggering right, Drifts right/left, Narrow base of support Gait velocity: decreased     General Gait Details: First amb without any AD to access true gait.  Pt was very unstable, present with both right/left as well as anterior drift reaching for furniture/walls/rails to steady self.  Pt also lacking self correction reaction responce as well as inability to "stop and go" with controll.  HIGH FALL RISK.  Second, amb with AD RW.  Although the walker did increase her ability to amb with improved stability, pt was still present with poor coordination self navigating walker around obsticles and through doorways.  Pt was dfiting right and unable to maintain a correct walker to self distance/position hitting her R foot on back walker leg.  Therapist had to correct 75% of time.  This prompted Therapist to perform a BERG balance test.   Stairs             Wheelchair Mobility    Modified Rankin (Stroke Patients Only)       Balance Overall balance assessment: Needs assistance   Sitting balance-Leahy Scale: Good       Standing balance-Leahy Scale: Poor Standing balance comment: pt reliant on external support of RW or therapist.  Near fall x2 during session.                 Standardized Balance Assessment Standardized Balance Assessment : Berg Balance Test Berg Balance Test Sit to Stand: Able to stand using hands after several tries Standing Unsupported: Able to stand 30 seconds unsupported Sitting with Back Unsupported but Feet Supported on Floor or Stool: Able to sit 2  minutes under supervision Stand to Sit: Needs assistance to sit Transfers: Needs one person to assist Standing Unsupported with Eyes Closed: Unable to keep eyes closed 3 seconds but stays steady Standing Ubsupported with Feet Together: Needs help to attain position but able to stand for 30 seconds with feet together From Standing, Reach Forward with Outstretched Arm: Reaches forward but needs supervision From Standing Position, Pick up Object from Floor: Able to pick up shoe, needs supervision From Standing Position, Turn to Look Behind Over each Shoulder: Needs supervision when turning Turn 360 Degrees: Needs close supervision or verbal cueing Standing Unsupported, Alternately Place Feet on Step/Stool: Needs assistance to keep from falling or unable to try Standing Unsupported, One Foot in Front: Loses balance while stepping or standing Standing on One Leg: Unable to try or needs assist to prevent fall Total Score: 16        Cognition Arousal/Alertness: Awake/alert Behavior During Therapy: WFL for tasks assessed/performed Overall Cognitive Status: Impaired/Different from baseline Area of Impairment: Memory, Safety/judgement, Awareness, Problem solving                               General Comments: AxO x 3 but present with intermittent attention deficit and delayed response time. Pleasant Lady.  Concerns for self safety awareness as well as balance/coordination impairements.        Exercises      General Comments General comments (skin  integrity, edema, etc.): BERG score 16/56 indicating 100% FALL RISK      Pertinent Vitals/Pain Pain Assessment Pain Assessment: No/denies pain    Home Living                          Prior Function            PT Goals (current goals can now be found in the care plan section) Progress towards PT goals: Progressing toward goals    Frequency    Min 3X/week      PT Plan Discharge plan needs to be updated     Co-evaluation              AM-PAC PT "6 Clicks" Mobility   Outcome Measure  Help needed turning from your back to your side while in a flat bed without using bedrails?: A Little Help needed moving from lying on your back to sitting on the side of a flat bed without using bedrails?: A Little Help needed moving to and from a bed to a chair (including a wheelchair)?: A Little Help needed standing up from a chair using your arms (e.g., wheelchair or bedside chair)?: A Little Help needed to walk in hospital room?: A Little Help needed climbing 3-5 steps with a railing? : A Lot 6 Click Score: 17    End of Session Equipment Utilized During Treatment: Gait belt Activity Tolerance: Patient tolerated treatment well Patient left: in bed;with call bell/phone within reach;with bed alarm set;with family/visitor present Nurse Communication: Mobility status PT Visit Diagnosis: Unsteadiness on feet (R26.81);Muscle weakness (generalized) (M62.81);Difficulty in walking, not elsewhere classified (R26.2)     Time: 0071-2197 PT Time Calculation (min) (ACUTE ONLY): 25 min  Charges:  $Gait Training: 8-22 mins $Therapeutic Activity: 8-22 mins                     Rica Koyanagi  PTA Acute  Rehabilitation Services Office M-F          [phone] Weekend pager [phone]

## 2021-10-02 NOTE — Progress Notes (Signed)
Chaplain completed notarization of Scientist, physiological, Healthcare POA.  Chaplain provided [facility] with three copies, including the original.  Chaplain also input one in her medical chart and sent one copy to [email or web address] to be uploaded in [place].    Chaplain is available to follow-up as needed.     10/02/21 1100  Clinical Encounter Type  Visited With Patient  Visit Type Follow-up;Social support

## 2021-10-02 NOTE — Progress Notes (Signed)
[facility]  PROGRESS NOTE  Lindsey Daniels MVE:[id number] DOB: February 14, 1956 DOA: 09/26/2021 PCP: Lindsey Mons, PA   Brief HPI:   66 year old female with past medical history of right temporal pleomorphic xanthoastrocytoma s/p temporal craniotomy, resection in 2015 with progression of disease s/p second craniotomy with subtotal resection and radiation, seizure disorder and hypertension presented with worsening weakness and fatigue. Patient was just recently discharged on 09/23/2021, at that time CT showed progression of her tumor with edema and mass effect.  Oncology Dr. Mickeal Daniels was consulted and recommended IV Decadron and transition to 2 mg daily Patient was admitted with worsening confusion and lethargy.  MRI brain showed worsening cerebral edema.  Case was discussed with Dr. Mickeal Daniels who recommended to increase steroids to 4 mg IV twice daily.  Patient will need to be discharged on 4 mg twice daily and follow-up with Dr. Mickeal Daniels as outpatient.  MRI results was discussed at the tumor board, findings likely reflect inflammation rather than progression of malignancy.  Patient to be discharged higher dose Decadron and follow-up with Dr. Mickeal Daniels.  If no improvement on outpatient follow-up Dr. Mickeal Daniels will consider Avastin. Patient awaiting rehab.   Subjective   Awaiting to go to skilled nursing facility.  Pending insurance authorization   Assessment/Plan:    Pleomorphic xanthoastrocytoma -S/p temporal craniotomy, resection in 2015 with progression of disease s/p craniotomy with subtotal resection and radiation -09/22/2021, CT head showed progression of tumor with edema and mass effect, Decadron dose was increased patient was discharged on Decadron 2 mg daily on 09/23/2021 -Patient was remitted on 09/26/2021 with lethargy and confusion -Patient received 1 dose of Decadron 10 mg IV in the ED -Dr. Mickeal Daniels, reviewed  Lindsey Daniels MRI at tumor borad, MRI finding likely reflects inflammation rather than  progression of cancer.  -He recommended to increase Decadron to 4 mg twice daily.  -Repeated CT head 8/13 due to AMS. CT finding stable from recent MRI.   Acute metabolic encephalopathy -Multifactorial, cerebral edema, astrocytoma, steroids -CT head showed stable findings compared to MRI from 8/12  Hypertension -Blood pressure is stable -Continue Norvasc 10 mg daily -On Cozaar 50 mg daily  Seizure disorder -Continue Keppra  Hyperlipidemia -Continue Lipitor  Hypokalemia -Replete   Medications     amLODipine  10 mg Oral Daily   atorvastatin  20 mg Oral Daily   cholecalciferol  1,000 Units Oral Daily   dexamethasone (DECADRON) injection  4 mg Intravenous Q12H   feeding supplement  237 mL Oral BID BM   levETIRAcetam  750 mg Oral BID   loratadine  10 mg Oral Daily   losartan  50 mg Oral Daily   multivitamin with minerals  1 tablet Oral Daily   pantoprazole  40 mg Oral BID   sertraline  100 mg Oral QHS     Data Reviewed:   CBG:  Recent Labs  Lab 09/26/21 1850  GLUCAP 125*    SpO2: 97 %    Vitals:   10/01/21 1227 10/01/21 2036 10/02/21 0534 10/02/21 1444  BP: (!) 143/84 (!) 141/79 (!) 150/87 127/65  Pulse: 90 78 62 87  Resp: '20 18 17 20  '$ Temp: 97.7 F (36.5 C) 98.4 F (36.9 C) 98 F (36.7 C) 97.8 F (36.6 C)  TempSrc: Oral Oral Oral Oral  SpO2: 97% 97% 99% 97%  Weight:      Height:          Data Reviewed:  Basic Metabolic Panel: Recent Labs  Lab 09/26/21 1900 09/28/21  0749 09/29/21 0345  NA 140 140 138  K 3.9 3.3* 4.1  CL 107 108 107  CO2 '23 23 23  '$ GLUCOSE 127* 119* 157*  BUN '11 15 21  '$ CREATININE 0.65 0.73 0.66  CALCIUM 9.4 9.2 8.9  MG  --   --  2.2    CBC: Recent Labs  Lab 09/26/21 1900 09/28/21 0749 09/29/21 0345  WBC 8.6 9.9 10.4  HGB 14.5 13.1 12.8  HCT 41.2 37.3 37.1  MCV 90.0 89.7 90.9  PLT 279 257 249    LFT No results for input(s): "AST", "ALT", "ALKPHOS", "BILITOT", "PROT", "ALBUMIN" in the last 168 hours.    Antibiotics: Anti-infectives (From admission, onward)    None        DVT prophylaxis: SCDs  Code Status: DNR, patient wants to be DNR.  Discussed with patient in detail.  Family Communication: Discussed with patient's brother on phone   CONSULTS neurooncology   Objective    Physical Examination:   General-appears in no acute distress Heart-S1-S2, regular, no murmur auscultated Lungs-clear to auscultation bilaterally, no wheezing or crackles auscultated Abdomen-soft, nontender, no organomegaly Extremities-no edema in the lower extremities Neuro-alert, oriented x3, no focal deficit noted  Status is: Inpatient:          Lindsey Daniels   [facility] If 7PM-7AM, please contact night-coverage at [email or web address], Office  [phone]   10/02/2021, 2:49 PM  LOS: 5 days

## 2021-10-02 NOTE — Plan of Care (Signed)
  Problem: Education: Goal: Knowledge of General Education information will improve Description: Including pain rating scale, medication(s)/side effects and non-pharmacologic comfort measures Outcome: Progressing   Problem: Health Behavior/Discharge Planning: Goal: Ability to manage health-related needs will improve Outcome: Progressing   Problem: Clinical Measurements: Goal: Ability to maintain clinical measurements within normal limits will improve Outcome: Progressing Goal: Will remain free from infection Outcome: Progressing Goal: Diagnostic test results will improve Outcome: Progressing   Problem: Activity: Goal: Risk for activity intolerance will decrease Outcome: Progressing   Problem: Nutrition: Goal: Adequate nutrition will be maintained Outcome: Progressing   Problem: Coping: Goal: Level of anxiety will decrease Outcome: Progressing   Problem: Elimination: Goal: Will not experience complications related to bowel motility Outcome: Progressing Goal: Will not experience complications related to urinary retention Outcome: Progressing   Problem: Safety: Goal: Ability to remain free from injury will improve Outcome: Progressing   Problem: Skin Integrity: Goal: Risk for impaired skin integrity will decrease Outcome: Progressing   

## 2021-10-02 NOTE — Progress Notes (Signed)
Pt had questions about code status, but unsure of exactly what was desired. Shared pt's concern r/t pt's related questions.

## 2021-10-03 DIAGNOSIS — D496 Neoplasm of unspecified behavior of brain: Secondary | ICD-10-CM | POA: Diagnosis not present

## 2021-10-03 DIAGNOSIS — R627 Adult failure to thrive: Secondary | ICD-10-CM | POA: Diagnosis not present

## 2021-10-03 DIAGNOSIS — R531 Weakness: Secondary | ICD-10-CM | POA: Diagnosis not present

## 2021-10-03 DIAGNOSIS — R2681 Unsteadiness on feet: Secondary | ICD-10-CM | POA: Diagnosis not present

## 2021-10-03 MED ORDER — DEXAMETHASONE 4 MG PO TABS
4.0000 mg | ORAL_TABLET | Freq: Two times a day (BID) | ORAL | Status: DC
  Administered 2021-10-03 – 2021-10-06 (×6): 4 mg via ORAL
  Filled 2021-10-03 (×6): qty 1

## 2021-10-03 NOTE — TOC Progression Note (Addendum)
Transition of Care [facility]) - Progression Note    Patient Details  Name: Lindsey Daniels MRN: [id number] Date of Birth: 17-Jul-1955  Transition of Care [facility]) CM/SW [place], RN Phone Number: 10/03/2021, 11:53 AM  Clinical Narrative:   SNF insurance Josem Kaufmann is still pending, MD, brother Lindsey Daniels is aware. [place] has contacted insurance company who advised it can take up to 48-72 hours for completion of insurance auth review.  Patient was sleeping when this RNCM attempt to advise.   - 3:45p RNCM spoke with patient and her brother Lindsey Daniels to advise we are still awaiting insurance auth. This RNCM confirmed with [place] that Josem Kaufmann is still pending.  TOC will continue to follow.    Expected Discharge Plan: [place] Barriers to Discharge: Continued Medical Work up  Expected Discharge Plan and Services Expected Discharge Plan: [place] In-house Referral: NA Discharge Planning Services: CM Consult Post Acute Care Choice: [place] Living arrangements for the past 2 months: Single Family Home                                       Social Determinants of Health (SDOH) Interventions    Readmission Risk Interventions     No data to display

## 2021-10-03 NOTE — Progress Notes (Signed)
[facility]  PROGRESS NOTE  Lindsey Daniels CZY:[id number] DOB: Sep 09, 1955 DOA: 09/26/2021 PCP: Lindsey Mons, PA   Brief HPI:   66 year old female with past medical history of right temporal pleomorphic xanthoastrocytoma s/p temporal craniotomy, resection in 2015 with progression of disease s/p second craniotomy with subtotal resection and radiation, seizure disorder and hypertension presented with worsening weakness and fatigue. Patient was just recently discharged on 09/23/2021, at that time CT showed progression of her tumor with edema and mass effect.  Oncology Dr. Mickeal Daniels was consulted and recommended IV Decadron and transition to 2 mg daily Patient was admitted with worsening confusion and lethargy.  MRI brain showed worsening cerebral edema.  Case was discussed with Dr. Mickeal Daniels who recommended to increase steroids to 4 mg IV twice daily.  Patient will need to be discharged on 4 mg twice daily and follow-up with Dr. Mickeal Daniels as outpatient.  MRI results was discussed at the tumor board, findings likely reflect inflammation rather than progression of malignancy.  Patient to be discharged higher dose Decadron and follow-up with Dr. Mickeal Daniels.  If no improvement on outpatient follow-up Dr. Mickeal Daniels will consider Avastin. Patient awaiting rehab.   Subjective   No new complaints.  Awaiting bed at skilled nursing facility.  Pending insurance authorization.   Assessment/Plan:    Pleomorphic xanthoastrocytoma -S/p temporal craniotomy, resection in 2015 with progression of disease s/p craniotomy with subtotal resection and radiation -09/22/2021, CT head showed progression of tumor with edema and mass effect, Decadron dose was increased patient was discharged on Decadron 2 mg daily on 09/23/2021 -Patient was remitted on 09/26/2021 with lethargy and confusion -Patient received 1 dose of Decadron 10 mg IV in the ED -Dr. Mickeal Daniels, reviewed  Lindsey Daniels MRI at tumor borad, MRI finding likely reflects  inflammation rather than progression of cancer.  -He recommended to increase Decadron to 4 mg twice daily.  -Repeated CT head 8/13 due to AMS. CT finding stable from recent MRI.   Acute metabolic encephalopathy -Multifactorial, cerebral edema, astrocytoma, steroids -CT head showed stable findings compared to MRI from 8/12  Hypertension -Blood pressure is stable -Continue Norvasc 10 mg daily -On Cozaar 50 mg daily  Seizure disorder -Continue Keppra  Hyperlipidemia -Continue Lipitor  Hypokalemia -Replete   Medications     amLODipine  10 mg Oral Daily   atorvastatin  20 mg Oral Daily   cholecalciferol  1,000 Units Oral Daily   dexamethasone (DECADRON) injection  4 mg Intravenous Q12H   feeding supplement  237 mL Oral BID BM   levETIRAcetam  750 mg Oral BID   loratadine  10 mg Oral Daily   losartan  50 mg Oral Daily   multivitamin with minerals  1 tablet Oral Daily   pantoprazole  40 mg Oral BID   sertraline  100 mg Oral QHS     Data Reviewed:   CBG:  Recent Labs  Lab 09/26/21 1850  GLUCAP 125*    SpO2: 96 %    Vitals:   10/02/21 0534 10/02/21 1444 10/02/21 2103 10/03/21 0529  BP: (!) 150/87 127/65 (!) 150/76 129/87  Pulse: 62 87 95 68  Resp: '17 20 19 18  '$ Temp: 98 F (36.7 C) 97.8 F (36.6 C) 98.1 F (36.7 C) 98 F (36.7 C)  TempSrc: Oral Oral Oral Oral  SpO2: 99% 97% 98% 96%  Weight:      Height:          Data Reviewed:  Basic Metabolic Panel: Recent Labs  Lab 09/26/21  1900 09/28/21 0749 09/29/21 0345  NA 140 140 138  K 3.9 3.3* 4.1  CL 107 108 107  CO2 '23 23 23  '$ GLUCOSE 662* 119* 157*  BUN '11 15 21  '$ CREATININE 0.65 0.73 0.66  CALCIUM 9.4 9.2 8.9  MG  --   --  2.2    CBC: Recent Labs  Lab 09/26/21 1900 09/28/21 0749 09/29/21 0345  WBC 8.6 9.9 10.4  HGB 14.5 13.1 12.8  HCT 41.2 37.3 37.1  MCV 90.0 89.7 90.9  PLT 279 257 249    LFT No results for input(s): "AST", "ALT", "ALKPHOS", "BILITOT", "PROT", "ALBUMIN" in the  last 168 hours.   Antibiotics: Anti-infectives (From admission, onward)    None        DVT prophylaxis: SCDs  Code Status: DNR, patient wants to be DNR.  Discussed with patient in detail.  Family Communication: Discussed with patient's brother on phone   CONSULTS neurooncology   Objective    Physical Examination:   General-appears in no acute distress Heart-S1-S2, regular, no murmur auscultated Lungs-clear to auscultation bilaterally, no wheezing or crackles auscultated Abdomen-soft, nontender, no organomegaly Extremities-no edema in the lower extremities Neuro-alert, oriented x3, no focal deficit noted  Status is: Inpatient:          Lindsey Daniels   [facility] If 7PM-7AM, please contact night-coverage at [email or web address], Office  [phone]   10/03/2021, 1:56 PM  LOS: 6 days

## 2021-10-03 NOTE — Progress Notes (Signed)
Occupational Therapy Treatment Patient Details Name: Lindsey Daniels MRN: [id number] DOB: 1955-08-17 Today's Date: 10/03/2021   History of present illness Patient is a 66 y.o. 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 Patient was just recently discharged on 09/23/2021, at that time CT showed progression of her tumor with edema and mass effect.  Oncology Dr. Mickeal Skinner was consulted and recommended IV Decadron and transition to 2 mg daily.  She has been requiring a walker for ambulation since discharge.  She has also been sleeping more.   OT comments  Patient progressing and showed improved balance during ambulation and dynamic ADL activities with up to [place] guard assist for safety compared to previous session. Pt however has decreased awareness of her needs and impairments and shows decreased safety awareness.  Patient remains limited by cognitive deficits, generalized weakness and decreased activity tolerance along with deficits noted below. Pt continues to demonstrate good rehab potential and would benefit from continued skilled OT to increase safety and independence with ADLs and functional transfers to allow pt to return home safely and reduce caregiver burden and fall risk.    Recommendations for follow up therapy are one component of a multi-disciplinary discharge planning process, led by the attending physician.  Recommendations may be updated based on patient status, additional functional criteria and insurance authorization.    Follow Up Recommendations  Skilled nursing-short term rehab (<3 hours/day) (OR 24/7 supervision and assistance at home with Home health)    Assistance Recommended at Discharge Frequent or constant  Supervision/Assistance  Patient can return home with the following  A little help with walking and/or transfers;A little help with bathing/dressing/bathroom;Assist for transportation;Assistance with cooking/housework;Direct supervision/assist for financial management;Direct supervision/assist for medications management   Equipment Recommendations  None recommended by OT    Recommendations for Other Services      Precautions / Restrictions Precautions Precautions: Fall Precaution Comments: Monitor HR Restrictions Weight Bearing Restrictions: No       Mobility Bed Mobility Overal bed mobility: Needs Assistance Bed Mobility: Supine to Sit     Supine to sit: Supervision          Transfers                         Balance Overall balance assessment: Needs assistance Sitting-balance support: Feet supported Sitting balance-Leahy Scale: Good     Standing balance support: Bilateral upper extremity supported, No upper extremity supported, During functional activity Standing balance-Leahy Scale: Poor                             ADL either performed or assessed with clinical judgement   ADL Overall ADL's : Needs assistance/impaired Eating/Feeding: Modified independent   Grooming: Standing;Supervision/safety;Wash/dry hands       Lower Body Bathing: Supervison/ safety;Sitting/lateral leans   Upper Body Dressing : Set up;Sitting   Lower Body Dressing: Min guard Lower Body Dressing Details (indicate cue type and reason): Pt required cues to find socks. Pt donned shoe without sock and reminded of sock. Pt then doffed shoe, found socks after cues then donned socks and shoes while seated EOB with increased time. After ambulation pt noted that shoe was untired and asked OT to tie for her. OT encouraged pt to sit and tie  herself. Pt impulsively lowered herself to one knee with good control and tied shoes. Pt with increased time/effort to motor plan and stand  from this position but did so, pushing down on RW with one hand and off knee with other with Min guard assist. Toilet Transfer: Rolling walker (2 wheels);Min guard;Cueing for safety Toilet Transfer Details (indicate cue type and reason): Pt stood from EOB with supervision and ambulated in hallway with RW per pt request. Pt completed ~75' with supervision to [place] guard.         Functional mobility during ADLs: Rolling walker (2 wheels);Cueing for sequencing;Cueing for safety;Min guard;Supervision/safety General ADL Comments: Pt's primary obstacle currently is safety awareness and awareness of impairments. Pt would likely to well at an ALF if she cannot secure increased supervision at home.    Extremity/Trunk Assessment Upper Extremity Assessment Upper Extremity Assessment: Overall WFL for tasks assessed   Lower Extremity Assessment Lower Extremity Assessment: Defer to PT evaluation   Cervical / Trunk Assessment Cervical / Trunk Assessment: Normal    Vision   Vision Assessment?: No apparent visual deficits   Perception     Praxis      Cognition Arousal/Alertness: Awake/alert Behavior During Therapy: Impulsive   Area of Impairment: Memory, Safety/judgement, Awareness, Problem solving                   Current Attention Level: Selective   Following Commands: Follows multi-step commands with increased time, Follows one step commands consistently Safety/Judgement: Decreased awareness of deficits, Decreased awareness of safety Awareness: Emergent Problem Solving: Requires verbal cues          Exercises      Shoulder Instructions       General Comments      Pertinent Vitals/ Pain       Pain Assessment Pain Assessment: Faces Faces Pain Scale: No hurt  Home Living                                          Prior Functioning/Environment              Frequency  Min 2X/week        Progress Toward Goals  OT Goals(current goals can  now be found in the care plan section)  Progress towards OT goals: Progressing toward goals  Acute Rehab OT Goals Patient Stated Goal: To go home. OT Goal Formulation: With patient Time For Goal Achievement: 10/12/21 Potential to Achieve Goals: [place]  Plan Discharge plan remains appropriate    Co-evaluation                 AM-PAC OT "6 Clicks" Daily Activity     Outcome Measure   Help from another person eating meals?: None Help from another person taking care of personal grooming?: A Little Help from another person toileting, which includes using toliet, bedpan, or urinal?: A Little Help from another person bathing (including washing, rinsing, drying)?: A Little Help from another person to put on and taking off regular upper body clothing?: A Little Help from another person to put on and taking off regular lower body clothing?: A Little 6 Click Score: 19    End of Session Equipment Utilized During Treatment: Rolling walker (2 wheels)  OT Visit Diagnosis: Other symptoms and signs involving cognitive function   Activity Tolerance Patient tolerated treatment well   Patient Left in chair;with call  bell/phone within reach;with chair alarm set   Nurse Communication          Time: [phone] OT Time Calculation (min): 20 min  Charges: OT General Charges $OT Visit: 1 Visit OT Treatments $Self Care/Home Management : 8-22 mins  Anderson Malta, [place] Office: [phone] 10/03/2021  Julien Girt 10/03/2021, 12:39 PM

## 2021-10-04 DIAGNOSIS — R2681 Unsteadiness on feet: Secondary | ICD-10-CM | POA: Diagnosis not present

## 2021-10-04 DIAGNOSIS — D496 Neoplasm of unspecified behavior of brain: Secondary | ICD-10-CM | POA: Diagnosis not present

## 2021-10-04 DIAGNOSIS — R627 Adult failure to thrive: Secondary | ICD-10-CM | POA: Diagnosis not present

## 2021-10-04 NOTE — Progress Notes (Signed)
[facility]  PROGRESS NOTE  Novis League JFH:[id number] DOB: Jun 23, 1955 DOA: 09/26/2021 PCP: Harrison Mons, PA   Brief HPI:   66 year old female with past medical history of right temporal pleomorphic xanthoastrocytoma s/p temporal craniotomy, resection in 2015 with progression of disease s/p second craniotomy with subtotal resection and radiation, seizure disorder and hypertension presented with worsening weakness and fatigue. Patient was just recently discharged on 09/23/2021, at that time CT showed progression of her tumor with edema and mass effect.  Oncology Dr. Mickeal Skinner was consulted and recommended IV Decadron and transition to 2 mg daily Patient was admitted with worsening confusion and lethargy.  MRI brain showed worsening cerebral edema.  Case was discussed with Dr. Mickeal Skinner who recommended to increase steroids to 4 mg IV twice daily.  Patient will need to be discharged on 4 mg twice daily and follow-up with Dr. Mickeal Skinner as outpatient.  MRI results was discussed at the tumor board, findings likely reflect inflammation rather than progression of malignancy.  Patient to be discharged higher dose Decadron and follow-up with Dr. Mickeal Skinner.  If no improvement on outpatient follow-up Dr. Mickeal Skinner will consider Avastin. Patient awaiting rehab.   Subjective   Patient seen, awaiting bed at skilled nursing facility.  Pending insurance authorization   Assessment/Plan:    Pleomorphic xanthoastrocytoma -S/p temporal craniotomy, resection in 2015 with progression of disease s/p craniotomy with subtotal resection and radiation -09/22/2021, CT head showed progression of tumor with edema and mass effect, Decadron dose was increased patient was discharged on Decadron 2 mg daily on 09/23/2021 -Patient was remitted on 09/26/2021 with lethargy and confusion -Patient received 1 dose of Decadron 10 mg IV in the ED -Dr. Mickeal Skinner, reviewed  Ms. Kostelecky MRI at tumor borad, MRI finding likely reflects inflammation  rather than progression of cancer.  -He recommended to increase Decadron to 4 mg twice daily.  -Repeated CT head 8/13 due to AMS. CT finding stable from recent MRI.   Acute metabolic encephalopathy -Multifactorial, cerebral edema, astrocytoma, steroids -CT head showed stable findings compared to MRI from 8/12  Hypertension -Blood pressure is stable -Continue Norvasc 10 mg daily -On Cozaar 50 mg daily  Seizure disorder -Continue Keppra  Hyperlipidemia -Continue Lipitor  Hypokalemia -Replete   Medications     amLODipine  10 mg Oral Daily   atorvastatin  20 mg Oral Daily   cholecalciferol  1,000 Units Oral Daily   dexamethasone  4 mg Oral Q12H   feeding supplement  237 mL Oral BID BM   levETIRAcetam  750 mg Oral BID   loratadine  10 mg Oral Daily   losartan  50 mg Oral Daily   multivitamin with minerals  1 tablet Oral Daily   pantoprazole  40 mg Oral BID   sertraline  100 mg Oral QHS     Data Reviewed:   CBG:  No results for input(s): "GLUCAP" in the last 168 hours.   SpO2: 97 %    Vitals:   10/02/21 2103 10/03/21 0529 10/03/21 2155 10/04/21 0508  BP: (!) 150/76 129/87 132/78 130/74  Pulse: 95 68 77 66  Resp: '19 18 20 17  '$ Temp: 98.1 F (36.7 C) 98 F (36.7 C) 98.2 F (36.8 C) 98 F (36.7 C)  TempSrc: Oral Oral Oral Oral  SpO2: 98% 96% 97% 97%  Weight:      Height:          Data Reviewed:  Basic Metabolic Panel: Recent Labs  Lab 09/28/21 0749 09/29/21 0345  NA  140 138  K 3.3* 4.1  CL 108 107  CO2 23 23  GLUCOSE 119* 157*  BUN 15 21  CREATININE 0.73 0.66  CALCIUM 9.2 8.9  MG  --  2.2    CBC: Recent Labs  Lab 09/28/21 0749 09/29/21 0345  WBC 9.9 10.4  HGB 13.1 12.8  HCT 37.3 37.1  MCV 89.7 90.9  PLT 257 249    LFT No results for input(s): "AST", "ALT", "ALKPHOS", "BILITOT", "PROT", "ALBUMIN" in the last 168 hours.   Antibiotics: Anti-infectives (From admission, onward)    None        DVT prophylaxis: SCDs  Code  Status: DNR, patient wants to be DNR.  Discussed with patient in detail.  Family Communication: Discussed with patient's brother on phone   CONSULTS neurooncology   Objective    Physical Examination:   General-appears in no acute distress Heart-S1-S2, regular, no murmur auscultated Lungs-clear to auscultation bilaterally, no wheezing or crackles auscultated Abdomen-soft, nontender, no organomegaly Extremities-no edema in the lower extremities Neuro-alert, oriented x3, no focal deficit noted  Status is: Inpatient:          Oswald Hillock   [facility] If 7PM-7AM, please contact night-coverage at [email or web address], Office  [phone]   10/04/2021, 4:59 PM  LOS: 7 days

## 2021-10-05 DIAGNOSIS — R531 Weakness: Secondary | ICD-10-CM | POA: Diagnosis not present

## 2021-10-05 DIAGNOSIS — D496 Neoplasm of unspecified behavior of brain: Secondary | ICD-10-CM | POA: Diagnosis not present

## 2021-10-05 DIAGNOSIS — R627 Adult failure to thrive: Secondary | ICD-10-CM | POA: Diagnosis not present

## 2021-10-05 DIAGNOSIS — R2681 Unsteadiness on feet: Secondary | ICD-10-CM | POA: Diagnosis not present

## 2021-10-05 NOTE — TOC Progression Note (Addendum)
Transition of Care [facility]) - Progression Note    Patient Details  Name: Lindsey Daniels MRN: [id number] Date of Birth: September 08, 1955  [facility] CM/SW Contact  Ross Ludwig, [place] Phone Number: 10/05/2021, 12:59 PM  Clinical Narrative:     CSW left message on Kia at [facility] in admissions voice mail to see if she had any updates on insurance approval.  CSW awaiting for a call back.  3:10pm  CSW spoke to [place] and she said insurance authorization is still pending, and she may have it tomorrow.  Weekday TOC will follow up with Kia on Monday.   Expected Discharge Plan: [place] Barriers to Discharge: Continued Medical Work up  Expected Discharge Plan and Services Expected Discharge Plan: [place] In-house Referral: NA Discharge Planning Services: CM Consult Post Acute Care Choice: [place] Living arrangements for the past 2 months: Single Family Home                                       Social Determinants of Health (SDOH) Interventions    Readmission Risk Interventions     No data to display

## 2021-10-05 NOTE — Progress Notes (Signed)
[facility]  PROGRESS NOTE  Lindsey Daniels NAT:[id number] DOB: 1955-05-09 DOA: 09/26/2021 PCP: Harrison Mons, PA   Brief HPI:   66 year old female with past medical history of right temporal pleomorphic xanthoastrocytoma s/p temporal craniotomy, resection in 2015 with progression of disease s/p second craniotomy with subtotal resection and radiation, seizure disorder and hypertension presented with worsening weakness and fatigue. Patient was just recently discharged on 09/23/2021, at that time CT showed progression of her tumor with edema and mass effect.  Oncology Dr. Mickeal Skinner was consulted and recommended IV Decadron and transition to 2 mg daily Patient was admitted with worsening confusion and lethargy.  MRI brain showed worsening cerebral edema.  Case was discussed with Dr. Mickeal Skinner who recommended to increase steroids to 4 mg IV twice daily.  Patient will need to be discharged on 4 mg twice daily and follow-up with Dr. Mickeal Skinner as outpatient.  MRI results was discussed at the tumor board, findings likely reflect inflammation rather than progression of malignancy.  Patient to be discharged higher dose Decadron and follow-up with Dr. Mickeal Skinner.  If no improvement on outpatient follow-up Dr. Mickeal Skinner will consider Avastin. Patient awaiting rehab.   Subjective   Patient seen and examined, awaiting bed at skilled nursing facility.   Assessment/Plan:    Pleomorphic xanthoastrocytoma -S/p temporal craniotomy, resection in 2015 with progression of disease s/p craniotomy with subtotal resection and radiation -09/22/2021, CT head showed progression of tumor with edema and mass effect, Decadron dose was increased patient was discharged on Decadron 2 mg daily on 09/23/2021 -Patient was remitted on 09/26/2021 with lethargy and confusion -Patient received 1 dose of Decadron 10 mg IV in the ED -Dr. Mickeal Skinner, reviewed  Ms. Strickland MRI at tumor borad, MRI finding likely reflects inflammation rather than  progression of cancer.  -He recommended to increase Decadron to 4 mg twice daily.  -Repeated CT head 8/13 due to AMS. CT finding stable from recent MRI.   Acute metabolic encephalopathy -Multifactorial, cerebral edema, astrocytoma, steroids -CT head showed stable findings compared to MRI from 8/12  Hypertension -Blood pressure is stable -Continue Norvasc 10 mg daily -On Cozaar 50 mg daily  Seizure disorder -Continue Keppra  Hyperlipidemia -Continue Lipitor  Hypokalemia -Replete   Medications     amLODipine  10 mg Oral Daily   atorvastatin  20 mg Oral Daily   cholecalciferol  1,000 Units Oral Daily   dexamethasone  4 mg Oral Q12H   feeding supplement  237 mL Oral BID BM   levETIRAcetam  750 mg Oral BID   loratadine  10 mg Oral Daily   losartan  50 mg Oral Daily   multivitamin with minerals  1 tablet Oral Daily   pantoprazole  40 mg Oral BID   sertraline  100 mg Oral QHS     Data Reviewed:   CBG:  No results for input(s): "GLUCAP" in the last 168 hours.   SpO2: 98 %    Vitals:   10/03/21 2155 10/04/21 0508 10/04/21 2100 10/05/21 0827  BP: 132/78 130/74 104/65 130/77  Pulse: 77 66 79 76  Resp: '20 17 18 16  '$ Temp: 98.2 F (36.8 C) 98 F (36.7 C) 98.4 F (36.9 C)   TempSrc: Oral Oral Oral   SpO2: 97% 97% 97% 98%  Weight:      Height:          Data Reviewed:  Basic Metabolic Panel: Recent Labs  Lab 09/29/21 0345  NA 138  K 4.1  CL 107  CO2 23  GLUCOSE 157*  BUN 21  CREATININE 0.66  CALCIUM 8.9  MG 2.2    CBC: Recent Labs  Lab 09/29/21 0345  WBC 10.4  HGB 12.8  HCT 37.1  MCV 90.9  PLT 249    LFT No results for input(s): "AST", "ALT", "ALKPHOS", "BILITOT", "PROT", "ALBUMIN" in the last 168 hours.   Antibiotics: Anti-infectives (From admission, onward)    None        DVT prophylaxis: SCDs  Code Status: DNR, patient wants to be DNR.  Discussed with patient in detail.  Family Communication: Discussed with patient's  brother on phone   CONSULTS neurooncology   Objective    Physical Examination:   General-appears in no acute distress Heart-S1-S2, regular, no murmur auscultated Lungs-clear to auscultation bilaterally, no wheezing or crackles auscultated Abdomen-soft, nontender, no organomegaly Extremities-no edema in the lower extremities Neuro-alert, oriented x3, no focal deficit noted  Status is: Inpatient:          Oswald Hillock   [facility] If 7PM-7AM, please contact night-coverage at [email or web address], Office  [phone]   10/05/2021, 2:15 PM  LOS: 8 days

## 2021-10-05 NOTE — Progress Notes (Signed)
Occupational Therapy Treatment Patient Details Name: Lindsey Daniels MRN: [id number] DOB: Jun 01, 1955 Today's Date: 10/05/2021   History of present illness Patient is a 66 y.o. 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 Patient was just recently discharged on 09/23/2021, at that time CT showed progression of her tumor with edema and mass effect.  Oncology Dr. Mickeal Skinner was consulted and recommended IV Decadron and transition to 2 mg daily.  She has been requiring a walker for ambulation since discharge.  She has also been sleeping more.   OT comments  Patient was noted to have poor safety awareness and easily distracted during tasks. Patient required cues to use walker and keep BUE in place during conversations during functional mobility to increase functional activity tolerance. Patient continues to need 24/7 supervision for safety awareness in next level of care. Patient would continue to benefit from skilled OT services at this time while admitted and after d/c to address noted deficits in order to improve overall safety and independence in ADLs.     Recommendations for follow up therapy are one component of a multi-disciplinary discharge planning process, led by the attending physician.  Recommendations may be updated based on patient status, additional functional criteria and insurance authorization.    Follow Up Recommendations  Skilled nursing-short term rehab (<3 hours/day)    Assistance Recommended at Discharge Frequent or constant Supervision/Assistance  Patient can return home with the following  A little help with walking and/or transfers;A little help with bathing/dressing/bathroom;Assist for transportation;Assistance with  cooking/housework;Direct supervision/assist for financial management;Direct supervision/assist for medications management   Equipment Recommendations       Recommendations for Other Services      Precautions / Restrictions Precautions Precautions: Fall Precaution Comments: Monitor HR Restrictions Weight Bearing Restrictions: No       Mobility Bed Mobility Overal bed mobility: Needs Assistance Bed Mobility: Supine to Sit     Supine to sit: Supervision Sit to supine: Supervision        Transfers                         Balance Overall balance assessment: Needs assistance Sitting-balance support: Feet supported Sitting balance-Leahy Scale: Good     Standing balance support: Bilateral upper extremity supported, No upper extremity supported, During functional activity Standing balance-Leahy Scale: Poor                             ADL either performed or assessed with clinical judgement   ADL Overall ADL's : Needs assistance/impaired                         Toilet Transfer: Rolling walker (2 wheels);Min guard;Cueing for safety Toilet Transfer Details (indicate cue type and reason): patient attempted to sit down on the commode with the toilet seat still up with multiple cues to place it down first. pooor safety awareness. Toileting- Water quality scientist and Hygiene: Min guard;Sit to/from stand;Cueing for safety         General ADL Comments: patient while participating in functional mobility in hallway during telling story about kneeling on the ground with her dog, patient then got onto her knees with min guard with education provided on not getting to knees anymore  and to use reacher to collect any items on floor. patient was mdo A for kneeling to stand. nurse was educated on this event with no fall with controlled getting onto knees and getting back. nurse verbalized understanding. patient was easily distracted during mobility and was not  [place] conversation on task. patient was pleasant in conversation but noted to be very distracted during mobility to increase functional activity tolerance. patient noted to remove hands from walker with cues to keep BUE on walker during tasks. patient was educated on usign shoes during next session to practice being able to clear BLE off floor. patient when asked about bruise on anterior wrist, reported that she got it from an IV when she was in a large room with alot of bed like in a [employer] on the war movies.    Extremity/Trunk Assessment              Vision       Perception     Praxis      Cognition Arousal/Alertness: Awake/alert Behavior During Therapy: Impulsive Overall Cognitive Status: Impaired/Different from baseline                                 General Comments: patient continues to have poor attention to taskm decreased safety awareness during tasks.        Exercises      Shoulder Instructions       General Comments      Pertinent Vitals/ Pain          Home Living                                          Prior Functioning/Environment              Frequency  Min 2X/week        Progress Toward Goals  OT Goals(current goals can now be found in the care plan section)  Progress towards OT goals: Progressing toward goals     Plan Discharge plan remains appropriate    Co-evaluation                 AM-PAC OT "6 Clicks" Daily Activity     Outcome Measure   Help from another person eating meals?: None Help from another person taking care of personal grooming?: A Little Help from another person toileting, which includes using toliet, bedpan, or urinal?: A Little Help from another person bathing (including washing, rinsing, drying)?: A Little Help from another person to put on and taking off regular upper body clothing?: A Little Help from another person to put on and taking off regular lower body  clothing?: A Little 6 Click Score: 19    End of Session Equipment Utilized During Treatment: Rolling walker (2 wheels);Gait belt  OT Visit Diagnosis: Other symptoms and signs involving cognitive function   Activity Tolerance Patient tolerated treatment well   Patient Left in chair;with call bell/phone within reach;with chair alarm set   Nurse Communication Other (comment) (patient choosing to kneel on the floor.)        Time: 1138-1201 OT Time Calculation (min): 23 min  Charges: OT General Charges $OT Visit: 1 Visit OT Treatments $Self Care/Home Management : 23-37 mins  Jackelyn Poling OTR/L, MS Acute Rehabilitation Department Office# [phone] Pager# [phone]   Feliz Beam  Alpha Mysliwiec 10/05/2021, 1:26 PM

## 2021-10-05 NOTE — Plan of Care (Signed)
  Problem: Safety: Goal: Ability to remain free from injury will improve Outcome: Progressing   Problem: Clinical Measurements: Goal: Ability to maintain clinical measurements within normal limits will improve Outcome: Progressing   Problem: Health Behavior/Discharge Planning: Goal: Ability to manage health-related needs will improve Outcome: Progressing

## 2021-10-06 DIAGNOSIS — R627 Adult failure to thrive: Secondary | ICD-10-CM | POA: Diagnosis not present

## 2021-10-06 DIAGNOSIS — R531 Weakness: Secondary | ICD-10-CM | POA: Diagnosis not present

## 2021-10-06 DIAGNOSIS — R2681 Unsteadiness on feet: Secondary | ICD-10-CM | POA: Diagnosis not present

## 2021-10-06 DIAGNOSIS — D496 Neoplasm of unspecified behavior of brain: Secondary | ICD-10-CM | POA: Diagnosis not present

## 2021-10-06 MED ORDER — PANTOPRAZOLE SODIUM 40 MG PO TBEC: 40.0000 mg | DELAYED_RELEASE_TABLET | Freq: Every day | ORAL | Status: DC

## 2021-10-06 MED ORDER — AMLODIPINE BESYLATE 10 MG PO TABS: 10.0000 mg | ORAL_TABLET | Freq: Every day | ORAL | Status: DC

## 2021-10-06 MED ORDER — LOSARTAN POTASSIUM 50 MG PO TABS: 50.0000 mg | ORAL_TABLET | Freq: Every day | ORAL | Status: DC

## 2021-10-06 MED ORDER — DEXAMETHASONE 4 MG PO TABS: 4.0000 mg | ORAL_TABLET | Freq: Two times a day (BID) | ORAL | Status: DC

## 2021-10-06 NOTE — TOC Transition Note (Addendum)
Transition of Care [facility]) - CM/SW Discharge Note   Patient Details  Name: Melis Trochez MRN: [id number] Date of Birth: [birth year]  Transition of Care [facility]) CM/SW Contact:  Roseanne Kaufman, RN Phone Number: 10/06/2021, 10:14 AM   Clinical Narrative:   Scarlette Ar with [place] advised insurance authorization has been approved. MD, RN, patient and patient's brother notified. Patient will be transferred to [facility] room# 110, report phone# [phone]. Awaiting d/c summary and transport information.  - 11:22am, [facility] has been called, folder at nursing secretary desk, MD, RN notified.   TOC will continue to follow.    Final next level of care: Skilled Nursing Facility Barriers to Discharge: No Barriers Identified   Patient Goals and CMS Choice Patient states their goals for this hospitalization and ongoing recovery are:: go to SNF for rehab CMS Medicare.gov Compare Post Acute Care list provided to:: Patient Represenative (must comment) (Wes(brother)) Choice offered to / list presented to : Sibling  Discharge Placement                       Discharge Plan and Services In-house Referral: NA Discharge Planning Services: CM Consult Post Acute Care Choice: Skilled Nursing Facility                               Social Determinants of Health (SDOH) Interventions     Readmission Risk Interventions     No data to display

## 2021-10-06 NOTE — Progress Notes (Signed)
[facility], & gave report to [facility] RN, who confirmed pt will be coming to their facility, room 110. Nurse verbalized understanding of information. Documenting RN provided cell phone # for any further questions.

## 2021-10-06 NOTE — Plan of Care (Signed)
  Problem: Education: Goal: Knowledge of General Education information will improve Description: Including pain rating scale, medication(s)/side effects and non-pharmacologic comfort measures Outcome: Adequate for Discharge   Problem: Health Behavior/Discharge Planning: Goal: Ability to manage health-related needs will improve Outcome: Adequate for Discharge   Problem: Clinical Measurements: Goal: Ability to maintain clinical measurements within normal limits will improve Outcome: Adequate for Discharge Goal: Will remain free from infection Outcome: Adequate for Discharge Goal: Diagnostic test results will improve Outcome: Adequate for Discharge   Problem: Activity: Goal: Risk for activity intolerance will decrease Outcome: Adequate for Discharge   Problem: Nutrition: Goal: Adequate nutrition will be maintained Outcome: Adequate for Discharge   Problem: Coping: Goal: Level of anxiety will decrease Outcome: Adequate for Discharge   Problem: Elimination: Goal: Will not experience complications related to bowel motility Outcome: Adequate for Discharge Goal: Will not experience complications related to urinary retention Outcome: Adequate for Discharge   Problem: Safety: Goal: Ability to remain free from injury will improve Outcome: Adequate for Discharge   Problem: Skin Integrity: Goal: Risk for impaired skin integrity will decrease Outcome: Adequate for Discharge

## 2021-10-06 NOTE — Plan of Care (Signed)
  Problem: Activity: Goal: Risk for activity intolerance will decrease Outcome: Progressing   Problem: Nutrition: Goal: Adequate nutrition will be maintained Outcome: Progressing   Problem: Coping: Goal: Level of anxiety will decrease Outcome: Progressing   Problem: Elimination: Goal: Will not experience complications related to bowel motility Outcome: Progressing Goal: Will not experience complications related to urinary retention Outcome: Progressing   Problem: Skin Integrity: Goal: Risk for impaired skin integrity will decrease Outcome: Progressing

## 2021-10-06 NOTE — Discharge Summary (Signed)
Physician Discharge Summary   Patient: Topacio Cella MRN: [id number] DOB: 1955-12-14  Admit date:     09/26/2021  Discharge date: 10/06/21  Discharge Physician: Oswald Hillock   PCP: Harrison Mons, PA   Recommendations at discharge:   Follow-up Dr. Mickeal Skinner as outpatient Continue taking Decadron 4 mg p.o. 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 Patient was just recently discharged on 09/23/2021, at that time CT showed progression of her tumor with edema and mass effect.  Oncology Dr. Mickeal Skinner was consulted and recommended IV Decadron and transition to 2 mg daily Patient was admitted with worsening confusion and lethargy.  MRI brain showed worsening cerebral edema.  Case was discussed with Dr. Mickeal Skinner who recommended to increase steroids to 4 mg IV twice daily.  Patient will need to be discharged on 4 mg twice daily and follow-up with Dr. Mickeal Skinner as outpatient.   MRI results was discussed at the tumor board, findings likely reflect inflammation rather than progression of malignancy.  Patient to be discharged higher dose Decadron and follow-up with Dr. Mickeal Skinner.  If no improvement on outpatient follow-up Dr. Mickeal Skinner will consider Avastin.  Assessment and Plan:   Pleomorphic xanthoastrocytoma -S/p temporal craniotomy, resection in 2015 with progression of disease s/p craniotomy with subtotal resection and radiation -09/22/2021, CT head showed  progression of tumor with edema and mass effect, Decadron dose was increased patient was discharged on Decadron 2 mg daily on 09/23/2021 -Patient was remitted on 09/26/2021 with lethargy and confusion -Patient received 1 dose of Decadron 10 mg IV in the ED -Dr. Mickeal Skinner, reviewed  Ms. Bathe MRI at tumor borad, MRI finding likely reflects inflammation rather than progression of cancer.  -He recommended to increase Decadron to 4 mg twice daily.  -Repeated CT head 8/13 due to AMS. CT finding stable from recent MRI.    Acute metabolic encephalopathy -Multifactorial, cerebral edema, astrocytoma, steroids -CT head showed stable findings compared to MRI from 8/12   Hypertension -Blood pressure is stable -Continue Norvasc 10 mg daily -On Cozaar 50 mg daily   Seizure disorder -Continue Keppra   Hyperlipidemia -Continue Lipitor   Hypokalemia -Replete       Consultants:  Procedures performed:  Disposition: Skilled nursing facility Diet recommendation:  Discharge Diet Orders (From admission, onward)     Start     Ordered   10/06/21 0000  Diet - low sodium heart healthy        10/06/21 1057           Regular diet DISCHARGE MEDICATION: Allergies as of 10/06/2021       Reactions   Rifapentine Other (See Comments)   Flu-like symptoms   Amoxicillin Rash   Clavulanic Acid Rash        Medication List     STOP taking these medications    LORazepam 1 MG tablet Commonly known as: ATIVAN   potassium chloride 20 MEQ packet Commonly known as: KLOR-CON   triamcinolone 55 MCG/ACT Aero nasal inhaler Commonly known as: NASACORT  TAKE these medications    amLODipine 10 MG tablet Commonly known as: NORVASC Take 1 tablet (10 mg total) by mouth daily.   atorvastatin 20 MG tablet Commonly known as: LIPITOR Take 1 tablet (20 mg total) by mouth daily.   cetirizine 10 MG tablet Commonly known as: ZYRTEC Take 10 mg by mouth daily.   cholecalciferol 1000 units  tablet Commonly known as: VITAMIN D Take 1,000 Units by mouth daily.   dexamethasone 4 MG tablet Commonly known as: DECADRON Take 1 tablet (4 mg total) by mouth every 12 (twelve) hours. What changed:  medication strength how much to take when to take this Another medication with the same name was removed. Continue taking this medication, and follow the directions you see here.   fluticasone 50 MCG/ACT nasal spray Commonly known as: FLONASE Place 1-2 sprays into both nostrils daily.   levETIRAcetam 250 MG tablet Commonly known as: KEPPRA Take 3 tablets (750 mg total) by mouth 2 (two) times daily.   losartan 50 MG tablet Commonly known as: COZAAR Take 1 tablet (50 mg total) by mouth daily.   multivitamin with minerals Tabs tablet Take 1 tablet by mouth at bedtime.   pantoprazole 40 MG tablet Commonly known as: PROTONIX Take 1 tablet (40 mg total) by mouth daily.   sertraline 100 MG tablet Commonly known as: ZOLOFT Take 1 tablet (100 mg total) by mouth daily. What changed: when to take this   valACYclovir 500 MG tablet Commonly known as: VALTREX Take 1 tablet (500 mg total) by mouth 2 (two) times daily. Takes only if needed for breakouts What changed:  when to take this reasons to take this additional instructions        Contact information for after-discharge care     Destination     [facility] Preferred SNF .   Service: Skilled Nursing Contact information: [address] [phone]                    Discharge Exam: Danley Danker Weights   09/26/21 1840  Weight: 79.4 kg   General-appears in no acute distress Heart-S1-S2, regular, no murmur auscultated Lungs-clear to auscultation bilaterally, no wheezing or crackles auscultated Abdomen-soft, nontender, no organomegaly Extremities-no edema in the lower extremities Neuro-alert, oriented x3, no focal deficit noted  Condition at discharge:  good  The results of significant diagnostics from this hospitalization (including imaging, microbiology, ancillary and laboratory) are listed below for reference.   Imaging Studies: CT HEAD WO CONTRAST (5MM)  Result Date: 09/28/2021 CLINICAL DATA:  66 year old female with history of PXA, progression. EXAM: CT HEAD WITHOUT CONTRAST TECHNIQUE: Contiguous axial images were obtained from the base of the skull through the vertex without intravenous contrast. RADIATION DOSE REDUCTION: This exam was performed according to the departmental dose-optimization program which includes automated exposure control, adjustment of the mA and/or kV according to patient size and/or use of iterative reconstruction technique. COMPARISON:  Restaging brain MRI yesterday, and earlier. FINDINGS: Brain: Intracranial mass effect with leftward midline shift up to 10 mm stable from the MRI yesterday. Right temporal lobe cystic resection cavity with abundant right hemisphere edema, and edema tracking into the right brainstem (better demonstrated by MRI) appears stable since the CT 09/22/2021. Basilar cisterns remain patent, and the suprasellar cistern appears slightly improved from the recent CT and MRI. No acute intracranial hemorrhage identified. Effaced right lateral ventricle with mildly enlarged left lateral ventricle but no convincing transependymal edema. No cortically based acute  infarct identified. Vascular: No suspicious intracranial vascular hyperdensity. Skull: Stable right side craniotomy. Sinuses/Orbits: Visualized paranasal sinuses and mastoids are clear. Other: No acute orbit or scalp soft tissue finding. IMPRESSION: 1. Stable compared to restaging Brain MRI yesterday. Abundant right hemisphere edema tracking into the brainstem and associated with intracranial mass effect, midline shift up to 10 mm. Mildly enlarged left lateral ventricle but no convincing transependymal edema. 2. No new intracranial abnormality.  Electronically Signed   By: Genevie Ann M.D.   On: 09/28/2021 10:47   MR BRAIN W WO CONTRAST  Result Date: 09/27/2021 CLINICAL DATA:  66 year old female with a history of PE XA, WHO grade 2. Original tumor resection in 2015. Progression and Re-resection in 2022, pathology unchanged. Status post IMRT in January and February this year. Treatment effect versus tumor progression since that time. Restaging. EXAM: MRI HEAD WITHOUT AND WITH CONTRAST TECHNIQUE: Multiplanar, multiecho pulse sequences of the brain and surrounding structures were obtained without and with intravenous contrast. CONTRAST:  47m GADAVIST GADOBUTROL 1 MMOL/ML IV SOLN COMPARISON:  Brain MRI 08/29/2021 and earlier.  Head CT 09/22/2021. FINDINGS: Brain: Heterogeneously enhancing lesion now encompasses 47 x 51 x 38 mm (AP by transverse by CC) centered at the right temporal lobe, versus 45 x 48 x 38 mm in May this year. But some areas of tumor enhancement do appear less solid since that time. And diffusion remains facilitated throughout much of the lesion. Stable mild hemosiderin within the lesion. However, extensive T2 and FLAIR hyperintensity has significantly progressed since May and is now associated with intracranial mass effect and leftward midline shift up to 10 mm (only trace midline shift 06/27/2021 and 5-6 mm last month). And new T2 and FLAIR hyperintensity tracking through the right cerebral peduncle and as far as the dorsal pons now. This brainstem signal is also largely new since last month. Mass effect on the right lateral ventricle. And mild left lateral ventriculomegaly. No definite transependymal edema. Partially effaced suprasellar cistern now. Other basilar cisterns remain patent. No dural thickening. No superimposed restricted diffusion to suggest acute infarction. No other abnormal intracranial enhancement identified. Acute intracranial hemorrhage. Cervicomedullary junction and pituitary are within normal limits. Vascular: Major  intracranial vascular flow voids are stable. The major dural venous sinuses are enhancing and appear to be patent. Skull and upper cervical spine: Negative visible cervical spine. Visualized bone marrow signal is within normal limits. Sinuses/Orbits: Stable and negative. Other: Mild right mastoid effusion is stable. IMPRESSION: 1. Progression of intracranial mass effect since May (see #2) in conjunction with increased infiltrative T2/FLAIR hyperintensity in the right hemisphere and now tracking into the right brainstem. Now unclear whether this is vasogenic edema or infiltrating tumor. At the same time, the heterogeneously enhancing right temporal lobe lesion has enlarged by up to 2 mm in each dimension since 06/27/2021. 2. Leftward midline shift is now 10 mm. Increased mass effect on the right lateral ventricle, and partially effaced suprasellar cistern. Mildly enlarged left lateral ventricle but no transependymal edema. Electronically Signed   By: HGenevie AnnM.D.   On: 09/27/2021 13:06   CT Head W or Wo Contrast  Result Date: 09/22/2021 CLINICAL DATA:  Mental status change, unknown cause EXAM: CT HEAD WITHOUT AND WITH CONTRAST TECHNIQUE: Contiguous axial images were obtained from the base of the skull through the vertex without and with intravenous contrast. RADIATION DOSE REDUCTION: This exam was performed according to the departmental dose-optimization program which includes automated exposure control, adjustment of the mA and/or kV according  to patient size and/or use of iterative reconstruction technique. CONTRAST:  35m OMNIPAQUE IOHEXOL 300 MG/ML  SOLN COMPARISON:  MRI head 08/29/2021. FINDINGS: Brain: When comparing across modalities to MRI head from July 30, 2021, suspected progression of known tumor with increased edema and mass effect with 11 mm of leftward midline shift at the foramen of Monro (previously 4 mm when remeasured). An MRI with contrast could further characterize if clinically warranted.  Mild rounding of the left temporal horn and left lateral ventricle is new from the prior and suspicious for ventricular entrapment. No evidence of superimposed acute large vascular territory infarct or acute hemorrhage. Vascular: No hyperdense vessel identified. Skull: Prior right pterional craniotomy.  No acute fracture. Sinuses/Orbits: Clear sinuses.  No acute orbital findings. Other: No mastoid effusions. IMPRESSION: 1. When comparing across modalities to MRI head from August 29, 2021, suspected progression of known tumor with increased edema and mass effect with 11 mm of leftward midline shift at the foramen of Monro (previously 4 mm when remeasured). An MRI with contrast could further characterize if clinically warranted. 2. Mild rounding of the left temporal horn and left lateral ventricle is new from the prior and suspicious for ventricular entrapment. Electronically Signed   By: FMargaretha SheffieldM.D.   On: 09/22/2021 13:38   DG Chest Port 1 View  Result Date: 09/22/2021 CLINICAL DATA:  Altered mental status EXAM: PORTABLE CHEST 1 VIEW COMPARISON:  08/26/2014 FINDINGS: Cardiac and mediastinal contours are within normal limits. No focal pulmonary opacity. No pleural effusion or pneumothorax. No acute osseous abnormality. IMPRESSION: No acute cardiopulmonary process. Electronically Signed   By: AMerilyn BabaM.D.   On: 09/22/2021 11:21    Microbiology: Results for orders placed or performed during the hospital encounter of 11/25/20  MRSA Next Gen by PCR, Nasal     Status: None   Collection Time: 11/25/20  5:45 PM   Specimen: Nasal Mucosa; Nasal Swab  Result Value Ref Range Status   MRSA by PCR Next Gen NOT DETECTED NOT DETECTED Final    Comment: (NOTE) The GeneXpert MRSA Assay (FDA approved for NASAL specimens only), is one component of a comprehensive MRSA colonization surveillance program. It is not intended to diagnose MRSA infection nor to guide or monitor treatment for MRSA infections. Test  performance is not FDA approved in patients less than 270years old. Performed at [facility] [address], [place] [address]  Surgical PCR screen     Status: None   Collection Time: 11/29/20  1:26 AM   Specimen: Nasal Mucosa; Nasal Swab  Result Value Ref Range Status   MRSA, PCR NEGATIVE NEGATIVE Final   Staphylococcus aureus NEGATIVE NEGATIVE Final    Comment: (NOTE) The Xpert SA Assay (FDA approved for NASAL specimens in patients 238years of age and older), is one component of a comprehensive surveillance program. It is not intended to diagnose infection nor to guide or monitor treatment. Performed at [facility] [address] [place] [address]    Labs: CBC: No results for input(s): "WBC", "NEUTROABS", "HGB", "HCT", "MCV", "PLT" in the last 168 hours. Basic Metabolic Panel: No results for input(s): "NA", "K", "CL", "CO2", "GLUCOSE", "BUN", "CREATININE", "CALCIUM", "MG", "PHOS" in the last 168 hours. Liver Function Tests: No results for input(s): "AST", "ALT", "ALKPHOS", "BILITOT", "PROT", "ALBUMIN" in the last 168 hours. CBG: No results for input(s): "GLUCAP" in the last 168 hours.  Discharge time spent: greater than 30 minutes.  Signed: Oswald Hillock, MD [facility] 10/06/2021

## 2021-10-07 ENCOUNTER — Other Ambulatory Visit: Payer: Self-pay | Admitting: Radiation Therapy

## 2021-10-11 ENCOUNTER — Ambulatory Visit (HOSPITAL_COMMUNITY)
Admission: RE | Admit: 2021-10-11 | Discharge: 2021-10-11 | Disposition: A | Discharge status: Home / Self Care | Payer: BC Managed Care – PPO | Source: Ambulatory Visit | Attending: Internal Medicine | Admitting: Internal Medicine

## 2021-10-11 DIAGNOSIS — C719 Malignant neoplasm of brain, unspecified: Secondary | ICD-10-CM | POA: Diagnosis present

## 2021-10-11 MED ORDER — GADOBUTROL 1 MMOL/ML IV SOLN
8.0000 mL | Freq: Once | INTRAVENOUS | Status: AC | PRN
  Administered 2021-10-11: 8 mL via INTRAVENOUS

## 2021-10-13 ENCOUNTER — Inpatient Hospital Stay: Payer: BC Managed Care – PPO | Attending: Radiation Oncology

## 2021-10-13 ENCOUNTER — Inpatient Hospital Stay: Payer: BC Managed Care – PPO | Admitting: Internal Medicine

## 2021-10-13 DIAGNOSIS — Z923 Personal history of irradiation: Secondary | ICD-10-CM | POA: Insufficient documentation

## 2021-10-13 DIAGNOSIS — Z7952 Long term (current) use of systemic steroids: Secondary | ICD-10-CM | POA: Insufficient documentation

## 2021-10-13 DIAGNOSIS — Z79899 Other long term (current) drug therapy: Secondary | ICD-10-CM | POA: Insufficient documentation

## 2021-10-13 DIAGNOSIS — C712 Malignant neoplasm of temporal lobe: Secondary | ICD-10-CM | POA: Insufficient documentation

## 2021-10-14 ENCOUNTER — Inpatient Hospital Stay: Payer: BC Managed Care – PPO | Admitting: Internal Medicine

## 2021-10-14 ENCOUNTER — Telehealth: Payer: Self-pay | Admitting: Internal Medicine

## 2021-10-14 ENCOUNTER — Other Ambulatory Visit: Payer: Self-pay

## 2021-10-14 ENCOUNTER — Other Ambulatory Visit: Payer: Self-pay | Admitting: *Deleted

## 2021-10-14 VITALS — BP 137/87 | HR 93 | Temp 97.3°F | Resp 17 | Wt 173.6 lb

## 2021-10-14 DIAGNOSIS — Z7952 Long term (current) use of systemic steroids: Secondary | ICD-10-CM | POA: Diagnosis not present

## 2021-10-14 DIAGNOSIS — G936 Cerebral edema: Secondary | ICD-10-CM | POA: Diagnosis not present

## 2021-10-14 DIAGNOSIS — Z923 Personal history of irradiation: Secondary | ICD-10-CM | POA: Diagnosis not present

## 2021-10-14 DIAGNOSIS — C719 Malignant neoplasm of brain, unspecified: Secondary | ICD-10-CM

## 2021-10-14 DIAGNOSIS — C712 Malignant neoplasm of temporal lobe: Secondary | ICD-10-CM | POA: Diagnosis not present

## 2021-10-14 DIAGNOSIS — R569 Unspecified convulsions: Secondary | ICD-10-CM | POA: Diagnosis not present

## 2021-10-14 DIAGNOSIS — Z79899 Other long term (current) drug therapy: Secondary | ICD-10-CM | POA: Diagnosis not present

## 2021-10-14 MED ORDER — DEXAMETHASONE 4 MG PO TABS: 4.0000 mg | ORAL_TABLET | Freq: Every day | ORAL | Status: DC

## 2021-10-14 NOTE — Progress Notes (Signed)
[facility] [place], [place] [address] [phone]   [facility]  Date of Service: 10/14/21 Patient Name: Lindsey Daniels Patient MRN: [id number] Patient DOB: 09-02-1955 Provider: Ventura Sellers, MD  Identifying Statement:  Lindsey Daniels is a 66 y.o. female with right temporal  pleomorphic xanthoastrocytoma    Oncologic History: Oncology History  Pleomorphic xanthoastrocytoma ([facility])  09/29/2013 Surgery   Right temporal craniotomy, resection with Dr. Sherwood Daniels.  Path is PXA WHO II.   11/29/2020 Surgery   Disease progression, second craniotomy with Dr. Christella Daniels, sub-total resection.  Path is unchanged.   02/21/2021 - 04/02/2021 Radiation Therapy   IMRT radiation therapy with Dr. Lisbeth Daniels     Biomarkers:  MGMT Methylated.  IDH 1/2 Wild type.  BRAF V600e mutation detected  TERT Unmutated   Interval History: Lindsey Daniels presents for follow up after recent hospitalization.  She feels significantly improved overall with the higher dose of decadron.  Close to baseline with regards to mental status, cognition, memory, balance.  Swelling in face, legs is severe again.  She also notices very easy bruising, new from prior.  Remains otherwise active, plan is to discharge to home (from SNF) this week.  Decadron currently at 12m twice daily.  H+P (01/30/21) Patient presents today after recent re-resection for progressive right temporal PXA.  She initially presented with a seizure in 2015; imaging demonstrated enhancing right temporal lesion which was grossly resected by Lindsey Daniels  After last follow up in 2020, she began to experience headaches, ear fullness and some confusion this fall.  Imaging demonstrated marked progression of disease; she went for second surgery on 11/29/20.  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   Medications: Current  Outpatient Medications on File Prior to Visit  Medication Sig Dispense Refill   amLODipine (NORVASC) 10 MG tablet Take 1 tablet (10 mg total) by mouth daily.     atorvastatin (LIPITOR) 20 MG tablet Take 1 tablet (20 mg total) by mouth daily. 90 tablet 3   cetirizine (ZYRTEC) 10 MG tablet Take 10 mg by mouth daily.     cholecalciferol (VITAMIN D) 1000 units tablet Take 1,000 Units by mouth daily.     dexamethasone (DECADRON) 4 MG tablet Take 1 tablet (4 mg total) by mouth every 12 (twelve) hours.     fluticasone (FLONASE) 50 MCG/ACT nasal spray Place 1-2 sprays into both nostrils daily. 16 g 0   levETIRAcetam (KEPPRA) 250 MG tablet Take 3 tablets (750 mg total) by mouth 2 (two) times daily. 180 tablet 2   losartan (COZAAR) 50 MG tablet Take 1 tablet (50 mg total) by mouth daily.     Multiple Vitamin (MULTIVITAMIN WITH MINERALS) TABS tablet Take 1 tablet by mouth at bedtime.     pantoprazole (PROTONIX) 40 MG tablet Take 1 tablet (40 mg total) by mouth daily.     sertraline (ZOLOFT) 100 MG tablet Take 1 tablet (100 mg total) by mouth daily. (Patient taking differently: Take 100 mg by mouth at bedtime.) 90 tablet 3   valACYclovir (VALTREX) 500 MG tablet Take 1 tablet (500 mg total) by mouth 2 (two) times daily. Takes only if needed for breakouts (Patient taking differently: Take 500 mg by mouth 2 (two) times daily as needed (for breakout).) 30 tablet prn   No current facility-administered medications on file prior to visit.  Allergies:  Allergies  Allergen Reactions   Rifapentine Other (See Comments)    Flu-like symptoms   Amoxicillin Rash   Clavulanic Acid Rash   Past Medical History:  Past Medical History:  Diagnosis Date   Allergy    Anal fissure    Anxiety    Cataract    Depression    Elevated LFTs    Hyperglycemia    Hyperlipidemia    Hypertension    Osteoarthritis    Pars defect of lumbar spine    Seizures (HCC)    Spondylolisthesis    lumbar   Past Surgical History:   Past Surgical History:  Procedure Laterality Date   cataract     CRANIOTOMY N/A 10/02/2013   Procedure: 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;  Surgeon: Lindsey Spangle, MD;  Location: [facility];  Service: Neurosurgery;  Laterality: N/A;  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   CRANIOTOMY Right 11/29/2020   Procedure: Right Parietal craniotomy for tumor resection;  Surgeon: Lindsey Pall, MD;  Location: [facility];  Service: Neurosurgery;  Laterality: Right;   HEEL SPUR SURGERY     Social History:  Social History   Socioeconomic History   Marital status: Divorced    Spouse name: separated-no papers file   Number of children: 0   Years of education: Not on file   Highest education level: Not on file  Occupational History   Occupation: RT    Employer: [employer] @ [employer]: [employer] and [employer]  Tobacco Use   Smoking status: Never   Smokeless tobacco: Never  Substance and Sexual Activity   Alcohol use: Yes    Alcohol/week: 0.0 - 4.0 standard drinks of alcohol    Comment: social   Drug use: No   Sexual activity: Not Currently    Partners: Male  Other Topics Concern   Not on file  Social History Narrative   Separated from second husband.  Very contentious split.   Social Determinants of Health   Financial Resource Strain: Not on file  Food Insecurity: Not on file  Transportation Needs: Not on file  Physical Activity: Not on file  Stress: Not on file  Social Connections: Not on file  Intimate Partner Violence: Not At Risk (02/06/2021)   Humiliation, Afraid, Rape, and Kick questionnaire    Fear of Current or Ex-Partner: No    Emotionally Abused: No    Physically Abused: No    Sexually Abused: No   Family History:  Family History  Problem Relation Age of Onset   Mental illness Father        depression   COPD Father    Alcohol abuse Brother    Breast cancer Neg Hx     Review of  Systems: Constitutional: Doesn't report fevers, chills or abnormal weight loss Eyes: Doesn't report blurriness of vision Ears, nose, mouth, throat, and face: Doesn't report sore throat Respiratory: Doesn't report cough, dyspnea or wheezes Cardiovascular: Doesn't report palpitation, chest discomfort  Gastrointestinal:  Doesn't report nausea, constipation, diarrhea GU: Doesn't report incontinence Skin: Doesn't report skin rashes Neurological: Per HPI Musculoskeletal: Doesn't report joint pain Behavioral/Psych: Doesn't report anxiety  Physical Exam: Vitals:   10/14/21 1330  BP: 137/87  Pulse: 93  Resp: 17  Temp: (!) 97.3 F (36.3 C)  SpO2: 95%    KPS: 80. General: cushingoid Head: Normal EENT: No conjunctival injection or scleral icterus.  Lungs: Resp effort normal Cardiac: Regular rate  Abdomen: Non-distended abdomen Skin: +purpura, arms and legs Extremities: +edema  Neurologic Exam: Mental Status: Awake, alert, attentive to examiner. Oriented to self and environment. Language is fluent with intact comprehension.  Cranial Nerves: Visual acuity is grossly normal. Visual fields are full. Extra-ocular movements intact. No ptosis. Face is symmetric Motor: Tone and bulk are normal. Power is full in both arms and legs. Reflexes are symmetric, no pathologic reflexes present.  Sensory: Intact to light touch Gait: Normal.   Labs: I have reviewed the data as listed    Component Value Date/Time   NA 138 09/29/2021 0345   NA 142 06/30/2017 1815   K 4.1 09/29/2021 0345   CL 107 09/29/2021 0345   CO2 23 09/29/2021 0345   GLUCOSE 157 (H) 09/29/2021 0345   BUN 21 09/29/2021 0345   BUN 14 06/30/2017 1815   CREATININE 0.66 09/29/2021 0345   CREATININE 0.62 02/06/2021 1121   CREATININE 0.72 06/07/2015 0825   CALCIUM 8.9 09/29/2021 0345   PROT 6.8 09/23/2021 0511   PROT 7.0 06/30/2017 1815   ALBUMIN 4.0 09/23/2021 0511   ALBUMIN 4.6 06/30/2017 1815   AST 16 09/23/2021 0511    ALT 18 09/23/2021 0511   ALKPHOS 68 09/23/2021 0511   BILITOT 1.1 09/23/2021 0511   BILITOT 0.6 06/30/2017 1815   GFRNONAA >60 09/29/2021 0345   GFRNONAA >60 02/06/2021 1121   GFRAA 108 06/30/2017 1815   Lab Results  Component Value Date   WBC 10.4 09/29/2021   NEUTROABS 6.4 09/22/2021   HGB 12.8 09/29/2021   HCT 37.1 09/29/2021   MCV 90.9 09/29/2021   PLT 249 09/29/2021   Imaging:  [facility] Clinician Interpretation: I have personally reviewed the CNS images as listed.  My interpretation, in the context of the patient's clinical presentation, is likely treatment effect  MR BRAIN W WO CONTRAST  Result Date: 10/13/2021 CLINICAL DATA:  Pleomorphic xanthoastrocytoma. Assess treatment response. EXAM: MRI HEAD WITHOUT AND WITH CONTRAST TECHNIQUE: Multiplanar, multiecho pulse sequences of the brain and surrounding structures were obtained without and with intravenous contrast. CONTRAST:  22m GADAVIST GADOBUTROL 1 MMOL/ML IV SOLN COMPARISON:  Brain MRI 09/27/2021 FINDINGS: Brain: Postsurgical changes reflecting right temporal craniotomy are again seen. The heterogeneously enhancing mass lesion centered in the right temporal lobe measures up to approximately 4.1 cm AP x 4.5 cm TV by 3.4 cm cc, previously measured up to 4.6 cm AP by 4.7 cm TV x 3.8 cm cc the slight decrease in size predominantly in the AP dimension is likely due to partial cavity collapse. Extensive peripheral nodular enhancement is overall similar to the prior study, with largest nodular focus of enhancement along the superior aspect of the resection cavity measuring up to 1.5 cm in the maximum dimension (17-14). Extensive FLAIR signal abnormality surrounding the resection cavity is again seen extending through much of the frontal, parietal, temporal, and occipital lobes; however, the signal abnormality and mass effect has substantially decreased since 09/27/2021 in the basal ganglia and internal capsule and right frontal lobe white  matter. Previously seen signal abnormality in the brainstem has resolved. Mass effect has significantly decreased with nearly resolved leftward midline shift and re-expansion of the right lateral ventricle. The left lateral ventricle has also normalized in size without evidence of transependymal flow of CSF. Small amounts of postsurgical blood products about the resection cavity are not significantly changed. There is faint diffusion restriction along the medial aspect of the resection cavity which is increased since the prior study. Diffusion restriction along  the inferior resection margin is similar to the prior study. There is no acute intracranial hemorrhage, extra-axial fluid collection, or acute infarct. Vascular: Normal flow voids. Skull and upper cervical spine: Postsurgical changes as above. There is no suspicious marrow signal abnormality. Sinuses/Orbits: The paranasal sinuses are clear. Bilateral lens implants are in place. The globes and orbits are otherwise unremarkable. Other: None. IMPRESSION: 1. Residual heterogeneously enhancement at the right temporal lobe resection site has slightly decreased in size since 09/27/2021; however, peripheral nodular enhancement is overall not significantly changed since the prior study with slightly increased diffusion restriction along the medial aspect of the resection cavity. Findings suspicious for residual tumor. 2. Improved surrounding FLAIR signal abnormality particularly in the right basal ganglia and internal capsule since 09/27/2021 with significantly decreased mass effect and essentially resolved midline shift. Electronically Signed   By: Valetta Mole M.D.   On: 10/13/2021 14:32   CT HEAD WO CONTRAST (5MM)  Result Date: 09/28/2021 CLINICAL DATA:  66 year old female with history of PXA, progression. EXAM: CT HEAD WITHOUT CONTRAST TECHNIQUE: Contiguous axial images were obtained from the base of the skull through the vertex without intravenous contrast.  RADIATION DOSE REDUCTION: This exam was performed according to the departmental dose-optimization program which includes automated exposure control, adjustment of the mA and/or kV according to patient size and/or use of iterative reconstruction technique. COMPARISON:  Restaging brain MRI yesterday, and earlier. FINDINGS: Brain: Intracranial mass effect with leftward midline shift up to 10 mm stable from the MRI yesterday. Right temporal lobe cystic resection cavity with abundant right hemisphere edema, and edema tracking into the right brainstem (better demonstrated by MRI) appears stable since the CT 09/22/2021. Basilar cisterns remain patent, and the suprasellar cistern appears slightly improved from the recent CT and MRI. No acute intracranial hemorrhage identified. Effaced right lateral ventricle with mildly enlarged left lateral ventricle but no convincing transependymal edema. No cortically based acute infarct identified. Vascular: No suspicious intracranial vascular hyperdensity. Skull: Stable right side craniotomy. Sinuses/Orbits: Visualized paranasal sinuses and mastoids are clear. Other: No acute orbit or scalp soft tissue finding. IMPRESSION: 1. Stable compared to restaging Brain MRI yesterday. Abundant right hemisphere edema tracking into the brainstem and associated with intracranial mass effect, midline shift up to 10 mm. Mildly enlarged left lateral ventricle but no convincing transependymal edema. 2. No new intracranial abnormality. Electronically Signed   By: Genevie Ann M.D.   On: 09/28/2021 10:47   MR BRAIN W WO CONTRAST  Result Date: 09/27/2021 CLINICAL DATA:  66 year old female with a history of PE XA, WHO grade 2. Original tumor resection in 2015. Progression and Re-resection in 2022, pathology unchanged. Status post IMRT in January and February this year. Treatment effect versus tumor progression since that time. Restaging. EXAM: MRI HEAD WITHOUT AND WITH CONTRAST TECHNIQUE: Multiplanar,  multiecho pulse sequences of the brain and surrounding structures were obtained without and with intravenous contrast. CONTRAST:  71m GADAVIST GADOBUTROL 1 MMOL/ML IV SOLN COMPARISON:  Brain MRI 08/29/2021 and earlier.  Head CT 09/22/2021. FINDINGS: Brain: Heterogeneously enhancing lesion now encompasses 47 x 51 x 38 mm (AP by transverse by CC) centered at the right temporal lobe, versus 45 x 48 x 38 mm in May this year. But some areas of tumor enhancement do appear less solid since that time. And diffusion remains facilitated throughout much of the lesion. Stable mild hemosiderin within the lesion. However, extensive T2 and FLAIR hyperintensity has significantly progressed since May and is now associated with intracranial mass effect and  leftward midline shift up to 10 mm (only trace midline shift 06/27/2021 and 5-6 mm last month). And new T2 and FLAIR hyperintensity tracking through the right cerebral peduncle and as far as the dorsal pons now. This brainstem signal is also largely new since last month. Mass effect on the right lateral ventricle. And mild left lateral ventriculomegaly. No definite transependymal edema. Partially effaced suprasellar cistern now. Other basilar cisterns remain patent. No dural thickening. No superimposed restricted diffusion to suggest acute infarction. No other abnormal intracranial enhancement identified. Acute intracranial hemorrhage. Cervicomedullary junction and pituitary are within normal limits. Vascular: Major intracranial vascular flow voids are stable. The major dural venous sinuses are enhancing and appear to be patent. Skull and upper cervical spine: Negative visible cervical spine. Visualized bone marrow signal is within normal limits. Sinuses/Orbits: Stable and negative. Other: Mild right mastoid effusion is stable. IMPRESSION: 1. Progression of intracranial mass effect since May (see #2) in conjunction with increased infiltrative T2/FLAIR hyperintensity in the right  hemisphere and now tracking into the right brainstem. Now unclear whether this is vasogenic edema or infiltrating tumor. At the same time, the heterogeneously enhancing right temporal lobe lesion has enlarged by up to 2 mm in each dimension since 06/27/2021. 2. Leftward midline shift is now 10 mm. Increased mass effect on the right lateral ventricle, and partially effaced suprasellar cistern. Mildly enlarged left lateral ventricle but no transependymal edema. Electronically Signed   By: Genevie Ann M.D.   On: 09/27/2021 13:06   CT Head W or Wo Contrast  Result Date: 09/22/2021 CLINICAL DATA:  Mental status change, unknown cause EXAM: CT HEAD WITHOUT AND WITH CONTRAST TECHNIQUE: Contiguous axial images were obtained from the base of the skull through the vertex without and with intravenous contrast. RADIATION DOSE REDUCTION: This exam was performed according to the departmental dose-optimization program which includes automated exposure control, adjustment of the mA and/or kV according to patient size and/or use of iterative reconstruction technique. CONTRAST:  33m OMNIPAQUE IOHEXOL 300 MG/ML  SOLN COMPARISON:  MRI head 08/29/2021. FINDINGS: Brain: When comparing across modalities to MRI head from July 30, 2021, suspected progression of known tumor with increased edema and mass effect with 11 mm of leftward midline shift at the foramen of Monro (previously 4 mm when remeasured). An MRI with contrast could further characterize if clinically warranted. Mild rounding of the left temporal horn and left lateral ventricle is new from the prior and suspicious for ventricular entrapment. No evidence of superimposed acute large vascular territory infarct or acute hemorrhage. Vascular: No hyperdense vessel identified. Skull: Prior right pterional craniotomy.  No acute fracture. Sinuses/Orbits: Clear sinuses.  No acute orbital findings. Other: No mastoid effusions. IMPRESSION: 1. When comparing across modalities to MRI head  from August 29, 2021, suspected progression of known tumor with increased edema and mass effect with 11 mm of leftward midline shift at the foramen of Monro (previously 4 mm when remeasured). An MRI with contrast could further characterize if clinically warranted. 2. Mild rounding of the left temporal horn and left lateral ventricle is new from the prior and suspicious for ventricular entrapment. Electronically Signed   By: FMargaretha SheffieldM.D.   On: 09/22/2021 13:38   DG Chest Port 1 View  Result Date: 09/22/2021 CLINICAL DATA:  Altered mental status EXAM: PORTABLE CHEST 1 VIEW COMPARISON:  08/26/2014 FINDINGS: Cardiac and mediastinal contours are within normal limits. No focal pulmonary opacity. No pleural effusion or pneumothorax. No acute osseous abnormality. IMPRESSION: No acute cardiopulmonary  process. Electronically Signed   By: Merilyn Baba M.D.   On: 09/22/2021 11:21    Assessment/Plan Pleomorphic xanthoastrocytoma ([place])  Focal seizures ([place])  Cerebral edema ([place])  Val Farnam is clinically improved today, following resumption of higher dose corticosteroids.  MRI brain demonstrates improvement in burden of edema and mass effect.  Enhancing mass is stable from prior studies.    She remains poorly tolerant of the decadron, however, with significant fluid retention, skin breakdown, and irritability.  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 We reviewed side effects including hypertension, bleeding/clotting and impaired wound healing.  Avastin would be beneficial as a steroid sparing agent.  Avastin should be held for the following:  ANC less than 1,000  Platelets less than 100,000  LFT or creatinine greater than 2x ULN  If clinical concerns/contraindications develop  In the meantime, we recommended decreasing decadron to 478mdaily if tolerated.  We will con't  taper following initial infusion.  Keppra should con't [id number].  We ask that MarKarle Starchler return to clinic for initial avastin treatment, or sooner as needed.  All questions were answered. The patient knows to call the clinic with any problems, questions or concerns. No barriers to learning were detected.  The total time spent in the encounter was 40 minutes and more than 50% was on counseling and review of test results   ZacVentura SellersD Medical Director of Neuro-[facility] [place]/29/23 1:51 PM

## 2021-10-14 NOTE — Telephone Encounter (Signed)
Per 8/29 workque called and left message for pt about appointments

## 2021-10-15 ENCOUNTER — Other Ambulatory Visit: Payer: Self-pay

## 2021-10-17 ENCOUNTER — Other Ambulatory Visit: Payer: Self-pay

## 2021-10-17 NOTE — Progress Notes (Signed)
Pharmacist Chemotherapy Monitoring - Initial Assessment    Anticipated start date: 10/27/21   The following has been reviewed per standard work regarding the patient's treatment regimen: The patient's diagnosis, treatment plan and drug doses, and organ/hematologic function Lab orders and baseline tests specific to treatment regimen  The treatment plan start date, drug sequencing, and pre-medications Prior authorization status  Patient's documented medication list, including drug-drug interaction screen and prescriptions for anti-emetics and supportive care specific to the treatment regimen The drug concentrations, fluid compatibility, administration routes, and timing of the medications to be used The patient's access for treatment and lifetime cumulative dose history, if applicable  The patient's medication allergies and previous infusion related reactions, if applicable   Changes made to treatment plan:  treatment plan date  Follow up needed:  Pending authorization for treatment    Kennith Center, Pharm.D., CPP 10/17/2021'@11'$ :02 AM

## 2021-10-27 ENCOUNTER — Inpatient Hospital Stay: Payer: BC Managed Care – PPO

## 2021-10-27 ENCOUNTER — Inpatient Hospital Stay: Payer: BC Managed Care – PPO | Attending: Radiation Oncology

## 2021-10-27 ENCOUNTER — Inpatient Hospital Stay: Payer: BC Managed Care – PPO | Admitting: Internal Medicine

## 2021-10-27 ENCOUNTER — Other Ambulatory Visit: Payer: Self-pay

## 2021-10-27 VITALS — BP 151/94 | HR 72 | Temp 97.9°F | Resp 18

## 2021-10-27 DIAGNOSIS — G936 Cerebral edema: Secondary | ICD-10-CM

## 2021-10-27 DIAGNOSIS — C719 Malignant neoplasm of brain, unspecified: Secondary | ICD-10-CM

## 2021-10-27 DIAGNOSIS — Z5112 Encounter for antineoplastic immunotherapy: Secondary | ICD-10-CM | POA: Insufficient documentation

## 2021-10-27 DIAGNOSIS — C712 Malignant neoplasm of temporal lobe: Secondary | ICD-10-CM | POA: Insufficient documentation

## 2021-10-27 DIAGNOSIS — Z79899 Other long term (current) drug therapy: Secondary | ICD-10-CM | POA: Diagnosis not present

## 2021-10-27 LAB — CBC WITH DIFFERENTIAL (CANCER CENTER ONLY)
Abs Immature Granulocytes: 0.74 10*3/uL — ABNORMAL HIGH (ref 0.00–0.07)
Basophils Absolute: 0.1 10*3/uL (ref 0.0–0.1)
Basophils Relative: 1 %
Eosinophils Absolute: 0 10*3/uL (ref 0.0–0.5)
Eosinophils Relative: 0 %
HCT: 37.2 % (ref 36.0–46.0)
Hemoglobin: 13.1 g/dL (ref 12.0–15.0)
Immature Granulocytes: 7 %
Lymphocytes Relative: 8 %
Lymphs Abs: 0.9 10*3/uL (ref 0.7–4.0)
MCH: 32.2 pg (ref 26.0–34.0)
MCHC: 35.2 g/dL (ref 30.0–36.0)
MCV: 91.4 fL (ref 80.0–100.0)
Monocytes Absolute: 0.6 10*3/uL (ref 0.1–1.0)
Monocytes Relative: 6 %
Neutro Abs: 8.2 10*3/uL — ABNORMAL HIGH (ref 1.7–7.7)
Neutrophils Relative %: 78 %
Platelet Count: 141 10*3/uL — ABNORMAL LOW (ref 150–400)
RBC: 4.07 MIL/uL (ref 3.87–5.11)
RDW: 13.9 % (ref 11.5–15.5)
Smear Review: NORMAL
WBC Count: 10.5 10*3/uL (ref 4.0–10.5)
nRBC: 0.4 % — ABNORMAL HIGH (ref 0.0–0.2)

## 2021-10-27 LAB — TOTAL PROTEIN, URINE DIPSTICK: Protein, ur: NEGATIVE mg/dL

## 2021-10-27 MED ORDER — SODIUM CHLORIDE 0.9 % IV SOLN
10.0000 mg/kg | Freq: Once | INTRAVENOUS | Status: AC
  Administered 2021-10-27: 800 mg via INTRAVENOUS
  Filled 2021-10-27: qty 32

## 2021-10-27 MED ORDER — DEXAMETHASONE 4 MG PO TABS: 2.0000 mg | ORAL_TABLET | Freq: Every day | ORAL | Status: DC

## 2021-10-27 MED ORDER — SODIUM CHLORIDE 0.9 % IV SOLN: Freq: Once | INTRAVENOUS | Status: AC

## 2021-10-27 NOTE — Patient Instructions (Signed)
[facility] MEDICAL ONCOLOGY  Discharge Instructions: Thank you for choosing [facility] to provide your oncology and hematology care.   If you have a lab appointment with the Cancer Center, please go directly to the Cancer Center and check in at the registration area.   Wear comfortable clothing and clothing appropriate for easy access to any Portacath or PICC line.   We strive to give you quality time with your provider. You may need to reschedule your appointment if you arrive late (15 or more minutes).  Arriving late affects you and other patients whose appointments are after yours.  Also, if you miss three or more appointments without notifying the office, you may be dismissed from the clinic at the provider's discretion.      For prescription refill requests, have your pharmacy contact our office and allow 72 hours for refills to be completed.    Today you received the following chemotherapy and/or immunotherapy agents; Bevacizumab (Zirabev)      To help prevent nausea and vomiting after your treatment, we encourage you to take your nausea medication as directed.  BELOW ARE SYMPTOMS THAT SHOULD BE REPORTED IMMEDIATELY: *FEVER GREATER THAN 100.4 F (38 C) OR HIGHER *CHILLS OR SWEATING *NAUSEA AND VOMITING THAT IS NOT CONTROLLED WITH YOUR NAUSEA MEDICATION *UNUSUAL SHORTNESS OF BREATH *UNUSUAL BRUISING OR BLEEDING *URINARY PROBLEMS (pain or burning when urinating, or frequent urination) *BOWEL PROBLEMS (unusual diarrhea, constipation, pain near the anus) TENDERNESS IN MOUTH AND THROAT WITH OR WITHOUT PRESENCE OF ULCERS (sore throat, sores in mouth, or a toothache) UNUSUAL RASH, SWELLING OR PAIN  UNUSUAL VAGINAL DISCHARGE OR ITCHING   Items with * indicate a potential emergency and should be followed up as soon as possible or go to the Emergency Department if any problems should occur.  Please show the CHEMOTHERAPY ALERT CARD or IMMUNOTHERAPY ALERT CARD at  check-in to the Emergency Department and triage nurse.  Should you have questions after your visit or need to cancel or reschedule your appointment, please contact [facility]ICAL [facility]COL[facility]  Dept: [phone]  and follow the prompts.  Office hours are 8:00 a.m. to 4:30 p.m. Monday - Friday. Please note that voicemails left after 4:00 p.m. may not be returned until the following business day.  We are closed weekends and major holidays. You have access to a nurse at all times for urgent questions. Please call the main number to the clinic Dept: [phone] and follow the prompts.   For any non-urgent questions, you may also contact your provider using MyChart. We now offer e-Visits for anyone 18 and older to request care online for non-urgent symptoms. For details visit [email or web address].   Also download the MyChart app! Go to the app store, search "MyChart", open the app, select [facility], and log in with your MyChart username and password.  Masks are optional in the cancer centers. If you would like for your care team to wear a mask while they are taking care of you, please let them know. You may have one support Alvera Tourigny who is at least 67 years old accompany you for your appointments. 

## 2021-10-27 NOTE — Progress Notes (Signed)
[facility] [address] [place], [place] [address] [phone]   [facility]  Date of Service: 10/27/21 Patient Name: Lindsey Daniels Patient MRN: [id number] Patient DOB: [birth year] Provider: Ventura Sellers, MD  Identifying Statement:  Lindsey Daniels is a 66 y.o. female with right temporal  pleomorphic xanthoastrocytoma    Oncologic History: Oncology History  Pleomorphic xanthoastrocytoma ([facility])  09/29/2013 Surgery   Right temporal craniotomy, resection with Dr. Sherwood Gambler.  Path is PXA WHO II.   11/29/2020 Surgery   Disease progression, second craniotomy with Dr. Christella Noa, sub-total resection.  Path is unchanged.   02/21/2021 - 04/02/2021 Radiation Therapy   IMRT radiation therapy with Dr. Lisbeth Renshaw   10/27/2021 -  Chemotherapy   Patient is on Treatment Plan : BRAIN GBM Bevacizumab q14d       Biomarkers:  MGMT Methylated.  IDH 1/2 Wild type.  BRAF V600e mutation detected  TERT Unmutated   Interval History: Lindsey Daniels presents for initial avastin infusion.  She has continued decadron 65m twice per day  Remains otherwise active, no other new or progressive changes.  H+P (01/30/21) Patient presents today after recent re-resection for progressive right temporal PXA.  She initially presented with a seizure in 2015; imaging demonstrated enhancing right temporal lesion which was grossly resected by Dr. NSherwood Gambler  After last follow up in 2020, she began to experience headaches, ear fullness and some confusion this fall.  Imaging demonstrated marked progression of disease; she went for second surgery on 11/29/20.  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   Medications: Current Outpatient Medications on File Prior to Visit  Medication Sig Dispense Refill   amLODipine (NORVASC) 10 MG tablet Take 1 tablet (10 mg total) by mouth daily.     atorvastatin  (LIPITOR) 20 MG tablet Take 1 tablet (20 mg total) by mouth daily. 90 tablet 3   cetirizine (ZYRTEC) 10 MG tablet Take 10 mg by mouth daily.     cholecalciferol (VITAMIN D) 1000 units tablet Take 1,000 Units by mouth daily.     dexamethasone (DECADRON) 4 MG tablet Take 1 tablet (4 mg total) by mouth daily.     fluticasone (FLONASE) 50 MCG/ACT nasal spray Place 1-2 sprays into both nostrils daily. 16 g 0   levETIRAcetam (KEPPRA) 250 MG tablet Take 3 tablets (750 mg total) by mouth 2 (two) times daily. 180 tablet 2   losartan (COZAAR) 50 MG tablet Take 1 tablet (50 mg total) by mouth daily.     Multiple Vitamin (MULTIVITAMIN WITH MINERALS) TABS tablet Take 1 tablet by mouth at bedtime.     pantoprazole (PROTONIX) 40 MG tablet Take 1 tablet (40 mg total) by mouth daily.     sertraline (ZOLOFT) 100 MG tablet Take 1 tablet (100 mg total) by mouth daily. (Patient taking differently: Take 100 mg by mouth at bedtime.) 90 tablet 3   valACYclovir (VALTREX) 500 MG tablet Take 1 tablet (500 mg total) by mouth 2 (two) times daily. Takes only if needed for breakouts (Patient taking differently: Take 500 mg by mouth 2 (two) times daily as needed (for breakout).) 30 tablet prn   No current facility-administered medications on file prior to visit.    Allergies:  Allergies  Allergen Reactions   Rifapentine Other (See Comments)    Flu-like symptoms   Amoxicillin Rash   Clavulanic Acid Rash   Past  Medical History:  Past Medical History:  Diagnosis Date   Allergy    Anal fissure    Anxiety    Cataract    Depression    Elevated LFTs    Hyperglycemia    Hyperlipidemia    Hypertension    Osteoarthritis    Pars defect of lumbar spine    Seizures (HCC)    Spondylolisthesis    lumbar   Past Surgical History:  Past Surgical History:  Procedure Laterality Date   cataract     CRANIOTOMY N/A 10/02/2013   Procedure: 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;  Surgeon: Hosie Spangle, MD;   Location: [facility];  Service: Neurosurgery;  Laterality: N/A;  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   CRANIOTOMY Right 11/29/2020   Procedure: Right Parietal craniotomy for tumor resection;  Surgeon: Ashok Pall, MD;  Location: [facility];  Service: Neurosurgery;  Laterality: Right;   HEEL SPUR SURGERY     Social History:  Social History   Socioeconomic History   Marital status: Divorced    Spouse name: separated-no papers file   Number of children: 0   Years of education: Not on file   Highest education level: Not on file  Occupational History   Occupation: RT    Employer: [employer] @ [employer]: [employer] and [employer]  Tobacco Use   Smoking status: Never   Smokeless tobacco: Never  Substance and Sexual Activity   Alcohol use: Yes    Alcohol/week: 0.0 - 4.0 standard drinks of alcohol    Comment: social   Drug use: No   Sexual activity: Not Currently    Partners: Male  Other Topics Concern   Not on file  Social History Narrative   Separated from second husband.  Very contentious split.   Social Determinants of Health   Financial Resource Strain: Not on file  Food Insecurity: Not on file  Transportation Needs: Not on file  Physical Activity: Not on file  Stress: Not on file  Social Connections: Not on file  Intimate Partner Violence: Not At Risk (02/06/2021)   Humiliation, Afraid, Rape, and Kick questionnaire    Fear of Current or Ex-Partner: No    Emotionally Abused: No    Physically Abused: No    Sexually Abused: No   Family History:  Family History  Problem Relation Age of Onset   Mental illness Father        depression   COPD Father    Alcohol abuse Brother    Breast cancer Neg Hx     Review of Systems: Constitutional: Doesn't report fevers, chills or abnormal weight loss Eyes: Doesn't report blurriness of vision Ears, nose, mouth, throat, and face: Doesn't report sore throat Respiratory: Doesn't  report cough, dyspnea or wheezes Cardiovascular: Doesn't report palpitation, chest discomfort  Gastrointestinal:  Doesn't report nausea, constipation, diarrhea GU: Doesn't report incontinence Skin: Doesn't report skin rashes Neurological: Per HPI Musculoskeletal: Doesn't report joint pain Behavioral/Psych: Doesn't report anxiety  Physical Exam: Vitals:   10/27/21 1445  BP: (!) 147/84  Pulse: 83  Resp: 17  Temp: 97.7 F (36.5 C)  SpO2: 95%    KPS: 80. General: cushingoid Head: Normal EENT: No conjunctival injection or scleral icterus.  Lungs: Resp effort normal Cardiac: Regular rate Abdomen: Non-distended abdomen Skin: +purpura, arms and legs Extremities: +edema  Neurologic Exam: Mental Status: Awake, alert, attentive to examiner. Oriented to self and environment. Language is fluent with  intact comprehension.  Cranial Nerves: Visual acuity is grossly normal. Visual fields are full. Extra-ocular movements intact. No ptosis. Face is symmetric Motor: Tone and bulk are normal. Power is full in both arms and legs. Reflexes are symmetric, no pathologic reflexes present.  Sensory: Intact to light touch Gait: Normal.   Labs: I have reviewed the data as listed    Component Value Date/Time   NA 138 09/29/2021 0345   NA 142 06/30/2017 1815   K 4.1 09/29/2021 0345   CL 107 09/29/2021 0345   CO2 23 09/29/2021 0345   GLUCOSE 157 (H) 09/29/2021 0345   BUN 21 09/29/2021 0345   BUN 14 06/30/2017 1815   CREATININE 0.66 09/29/2021 0345   CREATININE 0.62 02/06/2021 1121   CREATININE 0.72 06/07/2015 0825   CALCIUM 8.9 09/29/2021 0345   PROT 6.8 09/23/2021 0511   PROT 7.0 06/30/2017 1815   ALBUMIN 4.0 09/23/2021 0511   ALBUMIN 4.6 06/30/2017 1815   AST 16 09/23/2021 0511   ALT 18 09/23/2021 0511   ALKPHOS 68 09/23/2021 0511   BILITOT 1.1 09/23/2021 0511   BILITOT 0.6 06/30/2017 1815   GFRNONAA >60 09/29/2021 0345   GFRNONAA >60 02/06/2021 1121   GFRAA 108 06/30/2017 1815    Lab Results  Component Value Date   WBC 10.5 10/27/2021   NEUTROABS PENDING 10/27/2021   HGB 13.1 10/27/2021   HCT 37.2 10/27/2021   MCV 91.4 10/27/2021   PLT 141 (L) 10/27/2021   Imaging:  [facility] Clinician Interpretation: I have personally reviewed the CNS images as listed.  My interpretation, in the context of the patient's clinical presentation, is likely treatment effect  MR BRAIN W WO CONTRAST  Result Date: 10/13/2021 CLINICAL DATA:  Pleomorphic xanthoastrocytoma. Assess treatment response. EXAM: MRI HEAD WITHOUT AND WITH CONTRAST TECHNIQUE: Multiplanar, multiecho pulse sequences of the brain and surrounding structures were obtained without and with intravenous contrast. CONTRAST:  36m GADAVIST GADOBUTROL 1 MMOL/ML IV SOLN COMPARISON:  Brain MRI 09/27/2021 FINDINGS: Brain: Postsurgical changes reflecting right temporal craniotomy are again seen. The heterogeneously enhancing mass lesion centered in the right temporal lobe measures up to approximately 4.1 cm AP x 4.5 cm TV by 3.4 cm cc, previously measured up to 4.6 cm AP by 4.7 cm TV x 3.8 cm cc the slight decrease in size predominantly in the AP dimension is likely due to partial cavity collapse. Extensive peripheral nodular enhancement is overall similar to the prior study, with largest nodular focus of enhancement along the superior aspect of the resection cavity measuring up to 1.5 cm in the maximum dimension (17-14). Extensive FLAIR signal abnormality surrounding the resection cavity is again seen extending through much of the frontal, parietal, temporal, and occipital lobes; however, the signal abnormality and mass effect has substantially decreased since 09/27/2021 in the basal ganglia and internal capsule and right frontal lobe white matter. Previously seen signal abnormality in the brainstem has resolved. Mass effect has significantly decreased with nearly resolved leftward midline shift and re-expansion of the right lateral ventricle.  The left lateral ventricle has also normalized in size without evidence of transependymal flow of CSF. Small amounts of postsurgical blood products about the resection cavity are not significantly changed. There is faint diffusion restriction along the medial aspect of the resection cavity which is increased since the prior study. Diffusion restriction along the inferior resection margin is similar to the prior study. There is no acute intracranial hemorrhage, extra-axial fluid collection, or acute infarct. Vascular: Normal flow voids. Skull and  upper cervical spine: Postsurgical changes as above. There is no suspicious marrow signal abnormality. Sinuses/Orbits: The paranasal sinuses are clear. Bilateral lens implants are in place. The globes and orbits are otherwise unremarkable. Other: None. IMPRESSION: 1. Residual heterogeneously enhancement at the right temporal lobe resection site has slightly decreased in size since 09/27/2021; however, peripheral nodular enhancement is overall not significantly changed since the prior study with slightly increased diffusion restriction along the medial aspect of the resection cavity. Findings suspicious for residual tumor. 2. Improved surrounding FLAIR signal abnormality particularly in the right basal ganglia and internal capsule since 09/27/2021 with significantly decreased mass effect and essentially resolved midline shift. Electronically Signed   By: Valetta Mole M.D.   On: 10/13/2021 14:32   CT HEAD WO CONTRAST (5MM)  Result Date: 09/28/2021 CLINICAL DATA:  66 year old female with history of PXA, progression. EXAM: CT HEAD WITHOUT CONTRAST TECHNIQUE: Contiguous axial images were obtained from the base of the skull through the vertex without intravenous contrast. RADIATION DOSE REDUCTION: This exam was performed according to the departmental dose-optimization program which includes automated exposure control, adjustment of the mA and/or kV according to patient size  and/or use of iterative reconstruction technique. COMPARISON:  Restaging brain MRI yesterday, and earlier. FINDINGS: Brain: Intracranial mass effect with leftward midline shift up to 10 mm stable from the MRI yesterday. Right temporal lobe cystic resection cavity with abundant right hemisphere edema, and edema tracking into the right brainstem (better demonstrated by MRI) appears stable since the CT 09/22/2021. Basilar cisterns remain patent, and the suprasellar cistern appears slightly improved from the recent CT and MRI. No acute intracranial hemorrhage identified. Effaced right lateral ventricle with mildly enlarged left lateral ventricle but no convincing transependymal edema. No cortically based acute infarct identified. Vascular: No suspicious intracranial vascular hyperdensity. Skull: Stable right side craniotomy. Sinuses/Orbits: Visualized paranasal sinuses and mastoids are clear. Other: No acute orbit or scalp soft tissue finding. IMPRESSION: 1. Stable compared to restaging Brain MRI yesterday. Abundant right hemisphere edema tracking into the brainstem and associated with intracranial mass effect, midline shift up to 10 mm. Mildly enlarged left lateral ventricle but no convincing transependymal edema. 2. No new intracranial abnormality. Electronically Signed   By: Genevie Ann M.D.   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    Because of highly refractory radionecrosis and poor tolerance of decadron, we recommended transition to second line therapy with avastin 17m/kg IV q2-3 weeks. We reviewed side effects including hypertension, bleeding/clotting and impaired wound healing.  Avastin would be beneficial as a steroid sparing agent.  Avastin should be held for  the following:  ANC less than 1,000  Platelets less than 100,000  LFT or creatinine greater than 2x ULN  If clinical concerns/contraindications develop  Decadron should decrease to 4859mdaily x5 days, then 59m51maily thereafter if tolerated.  Keppra should con't 750m67mD.  We ask that MaryKarle Daniels return to clinic in 2 weeks for cycle 2/4 avastin, or sooner if needed.  All questions were answered. The patient knows to call the clinic with any problems, questions or concerns. No barriers to learning were detected.  The total time spent in the encounter was 30 minutes and more than 50% was on counseling and review of test results   ZachVentura Sellers  Medical Director of Neuro-Oncology [facility] at [place] 10/27/21 2:56 PM

## 2021-10-29 ENCOUNTER — Other Ambulatory Visit: Payer: Self-pay

## 2021-11-04 ENCOUNTER — Ambulatory Visit: Payer: BC Managed Care – PPO | Admitting: Internal Medicine

## 2021-11-04 ENCOUNTER — Ambulatory Visit: Payer: BC Managed Care – PPO

## 2021-11-04 ENCOUNTER — Other Ambulatory Visit: Payer: BC Managed Care – PPO

## 2021-11-08 ENCOUNTER — Other Ambulatory Visit: Payer: Self-pay

## 2021-11-10 ENCOUNTER — Encounter: Payer: Self-pay | Admitting: *Deleted

## 2021-11-10 ENCOUNTER — Other Ambulatory Visit: Payer: Self-pay | Admitting: Internal Medicine

## 2021-11-10 ENCOUNTER — Ambulatory Visit (HOSPITAL_COMMUNITY)
Admission: RE | Admit: 2021-11-10 | Discharge: 2021-11-10 | Disposition: A | Discharge status: Home / Self Care | Payer: BC Managed Care – PPO | Source: Ambulatory Visit | Attending: Internal Medicine | Admitting: Internal Medicine

## 2021-11-10 ENCOUNTER — Other Ambulatory Visit: Payer: Self-pay

## 2021-11-10 ENCOUNTER — Inpatient Hospital Stay: Payer: BC Managed Care – PPO

## 2021-11-10 ENCOUNTER — Telehealth: Payer: Self-pay | Admitting: *Deleted

## 2021-11-10 ENCOUNTER — Inpatient Hospital Stay (HOSPITAL_BASED_OUTPATIENT_CLINIC_OR_DEPARTMENT_OTHER): Payer: BC Managed Care – PPO | Admitting: Internal Medicine

## 2021-11-10 ENCOUNTER — Encounter (HOSPITAL_COMMUNITY): Payer: Self-pay

## 2021-11-10 DIAGNOSIS — G936 Cerebral edema: Secondary | ICD-10-CM

## 2021-11-10 DIAGNOSIS — C719 Malignant neoplasm of brain, unspecified: Secondary | ICD-10-CM | POA: Insufficient documentation

## 2021-11-10 DIAGNOSIS — Z5112 Encounter for antineoplastic immunotherapy: Secondary | ICD-10-CM | POA: Diagnosis not present

## 2021-11-10 LAB — CBC WITH DIFFERENTIAL (CANCER CENTER ONLY)
Abs Immature Granulocytes: 0.1 10*3/uL — ABNORMAL HIGH (ref 0.00–0.07)
Basophils Absolute: 0 10*3/uL (ref 0.0–0.1)
Basophils Relative: 1 %
Eosinophils Absolute: 0.1 10*3/uL (ref 0.0–0.5)
Eosinophils Relative: 1 %
HCT: 35 % — ABNORMAL LOW (ref 36.0–46.0)
Hemoglobin: 12.6 g/dL (ref 12.0–15.0)
Immature Granulocytes: 2 %
Lymphocytes Relative: 26 %
Lymphs Abs: 1.7 10*3/uL (ref 0.7–4.0)
MCH: 32.2 pg (ref 26.0–34.0)
MCHC: 36 g/dL (ref 30.0–36.0)
MCV: 89.5 fL (ref 80.0–100.0)
Monocytes Absolute: 0.9 10*3/uL (ref 0.1–1.0)
Monocytes Relative: 14 %
Neutro Abs: 3.7 10*3/uL (ref 1.7–7.7)
Neutrophils Relative %: 56 %
Platelet Count: 182 10*3/uL (ref 150–400)
RBC: 3.91 MIL/uL (ref 3.87–5.11)
RDW: 14.6 % (ref 11.5–15.5)
WBC Count: 6.5 10*3/uL (ref 4.0–10.5)
nRBC: 0 % (ref 0.0–0.2)

## 2021-11-10 MED ORDER — IOHEXOL 350 MG/ML SOLN
75.0000 mL | Freq: Once | INTRAVENOUS | Status: AC | PRN
  Administered 2021-11-10: 75 mL via INTRAVENOUS

## 2021-11-10 MED ORDER — APIXABAN (ELIQUIS) VTE STARTER PACK (10MG AND 5MG): ORAL_TABLET | ORAL | 0 refills | Status: DC

## 2021-11-10 MED ORDER — SODIUM CHLORIDE (PF) 0.9 % IJ SOLN
INTRAMUSCULAR | Status: AC
  Filled 2021-11-10: qty 50

## 2021-11-10 NOTE — Telephone Encounter (Signed)
Communicated directly with the patient the results of her CT Angio and the instructions of the Rx for Eliquis that was sent to her pharmacy.

## 2021-11-10 NOTE — Progress Notes (Signed)
[facility] at [place], [place] [address] [phone]   [facility] [facility] Evaluation  Date of Service: 11/10/21 Patient Name: Lindsey Daniels Patient MRN: [id number] Patient DOB: 12/06/1955 Provider: Ventura Sellers, MD  Identifying Statement:  Lindsey Daniels is a 66 y.o. female with right temporal  pleomorphic xanthoastrocytoma    Oncologic History: Oncology History  Pleomorphic xanthoastrocytoma ([place])  09/29/2013 Surgery   Right temporal craniotomy, resection with Dr. Sherwood Gambler.  Path is PXA WHO II.   11/29/2020 Surgery   Disease progression, second craniotomy with Dr. Christella Noa, sub-total resection.  Path is unchanged.   02/21/2021 - 04/02/2021 Radiation Therapy   IMRT radiation therapy with Dr. Lisbeth Renshaw   10/27/2021 -  Chemotherapy   Patient is on Treatment Plan : BRAIN GBM Bevacizumab q14d       Biomarkers:  MGMT Methylated.  IDH 1/2 Wild type.  BRAF V600e mutation detected  TERT Unmutated   Interval History: Lindsey Daniels presents for initial avastin infusion.  Today she describes new onset shortness of breath, beginning last week.  She is out of breath even walking down driveway.  With coughing or extertion she notices pain in both sides of her chest.  Heartrate has felt elevated.  She has discontinued the decadron as of yesterday.  Remains otherwise active, no further seizures.  H+P (01/30/21) Patient presents today after recent re-resection for progressive right temporal PXA.  She initially presented with a seizure in 2015; imaging demonstrated enhancing right temporal lesion which was grossly resected by Dr. Sherwood Gambler.  After last follow up in 2020, she began to experience headaches, ear fullness and some confusion this fall.  Imaging demonstrated marked progression of disease; she went for second surgery on 11/29/20.  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   Medications: Current Outpatient Medications on File Prior to Visit  Medication Sig Dispense Refill   amLODipine (NORVASC) 10 MG tablet Take 1 tablet (10 mg total) by mouth daily.     atorvastatin (LIPITOR) 20 MG tablet Take 1 tablet (20 mg total) by mouth daily. 90 tablet 3   cetirizine (ZYRTEC) 10 MG tablet Take 10 mg by mouth daily.     cholecalciferol (VITAMIN D) 1000 units tablet Take 1,000 Units by mouth daily.     fluticasone (FLONASE) 50 MCG/ACT nasal spray Place 1-2 sprays into both nostrils daily. 16 g 0   losartan (COZAAR) 50 MG tablet Take 1 tablet (50 mg total) by mouth daily.     Multiple Vitamin (MULTIVITAMIN WITH MINERALS) TABS tablet Take 1 tablet by mouth at bedtime.     pantoprazole (PROTONIX) 40 MG tablet Take 1 tablet (40 mg total) by mouth daily.     sertraline (ZOLOFT) 100 MG tablet Take 1 tablet (100 mg total) by mouth daily. (Patient taking differently: Take 100 mg by mouth at bedtime.) 90 tablet 3   valACYclovir (VALTREX) 500 MG tablet Take 1 tablet (500 mg total) by mouth 2 (two) times daily. Takes only if needed for breakouts (Patient taking differently: Take 500 mg by mouth 2 (two) times daily as needed (for breakout).) 30 tablet prn   levETIRAcetam (KEPPRA) 250 MG tablet Take 3 tablets (750 mg total) by mouth 2 (two) times daily. 180 tablet 2   No current facility-administered medications on file prior to visit.    Allergies:  Allergies  Allergen Reactions   Rifapentine  Other (See Comments)    Flu-like symptoms   Amoxicillin Rash   Clavulanic Acid Rash   Past Medical History:  Past Medical History:  Diagnosis Date   Allergy    Anal fissure    Anxiety    Cataract    Depression    Elevated LFTs    Hyperglycemia    Hyperlipidemia    Hypertension    Osteoarthritis    Pars defect of lumbar spine    Seizures (HCC)    Spondylolisthesis    lumbar   Past Surgical History:  Past Surgical History:  Procedure Laterality Date   cataract      CRANIOTOMY N/A 10/02/2013   Procedure: 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;  Surgeon: Hosie Spangle, MD;  Location: [facility];  Service: Neurosurgery;  Laterality: N/A;  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   CRANIOTOMY Right 11/29/2020   Procedure: Right Parietal craniotomy for tumor resection;  Surgeon: Ashok Pall, MD;  Location: [facility];  Service: Neurosurgery;  Laterality: Right;   HEEL SPUR SURGERY     Social History:  Social History   Socioeconomic History   Marital status: Divorced    Spouse name: separated-no papers file   Number of children: 0   Years of education: Not on file   Highest education level: Not on file  Occupational History   Occupation: RT    Employer: [employer] @ [place]: [employer] and [employer]  Tobacco Use   Smoking status: Never   Smokeless tobacco: Never  Substance and Sexual Activity   Alcohol use: Yes    Alcohol/week: 0.0 - 4.0 standard drinks of alcohol    Comment: social   Drug use: No   Sexual activity: Not Currently    Partners: Male  Other Topics Concern   Not on file  Social History Narrative   Separated from second husband.  Very contentious split.   Social Determinants of Health   Financial Resource Strain: Not on file  Food Insecurity: Not on file  Transportation Needs: Not on file  Physical Activity: Not on file  Stress: Not on file  Social Connections: Not on file  Intimate Partner Violence: Not At Risk (02/06/2021)   Humiliation, Afraid, Rape, and Kick questionnaire    Fear of Current or Ex-Partner: No    Emotionally Abused: No    Physically Abused: No    Sexually Abused: No   Family History:  Family History  Problem Relation Age of Onset   Mental illness Father        depression   COPD Father    Alcohol abuse Brother    Breast cancer Neg Hx     Review of Systems: Constitutional: Doesn't report fevers, chills or abnormal weight loss Eyes:  Doesn't report blurriness of vision Ears, nose, mouth, throat, and face: Doesn't report sore throat Respiratory: +dyspnea Cardiovascular: Doesn't report palpitation, chest discomfort  Gastrointestinal:  Doesn't report nausea, constipation, diarrhea GU: Doesn't report incontinence Skin: Doesn't report skin rashes Neurological: Per HPI Musculoskeletal: Doesn't report joint pain Behavioral/Psych: Doesn't report anxiety  Physical Exam: Vitals:   11/10/21 0918  BP: (!) 149/92  Pulse: (!) 120  Resp: 16  Temp: 98 F (36.7 C)  SpO2: 95%    KPS: 80. General: cushingoid Head: Normal EENT: No conjunctival injection or scleral icterus.  Lungs: Increased work of breathing, tachypneic  Cardiac: Regular rate Abdomen: Non-distended abdomen Skin: +purpura, arms and legs Extremities: +edema  Neurologic Exam: Mental Status: Awake, alert, attentive to examiner. Oriented to self and environment. Language is fluent with intact comprehension.  Cranial Nerves: Visual acuity is grossly normal. Visual fields are full. Extra-ocular movements intact. No ptosis. Face is symmetric Motor: Tone and bulk are normal. Power is full in both arms and legs. Reflexes are symmetric, no pathologic reflexes present.  Sensory: Intact to light touch Gait: Normal.   Labs: I have reviewed the data as listed    Component Value Date/Time   NA 138 09/29/2021 0345   NA 142 06/30/2017 1815   K 4.1 09/29/2021 0345   CL 107 09/29/2021 0345   CO2 23 09/29/2021 0345   GLUCOSE 157 (H) 09/29/2021 0345   BUN 21 09/29/2021 0345   BUN 14 06/30/2017 1815   CREATININE 0.66 09/29/2021 0345   CREATININE 0.62 02/06/2021 1121   CREATININE 0.72 06/07/2015 0825   CALCIUM 8.9 09/29/2021 0345   PROT 6.8 09/23/2021 0511   PROT 7.0 06/30/2017 1815   ALBUMIN 4.0 09/23/2021 0511   ALBUMIN 4.6 06/30/2017 1815   AST 16 09/23/2021 0511   ALT 18 09/23/2021 0511   ALKPHOS 68 09/23/2021 0511   BILITOT 1.1 09/23/2021 0511   BILITOT  0.6 06/30/2017 1815   GFRNONAA >60 09/29/2021 0345   GFRNONAA >60 02/06/2021 1121   GFRAA 108 06/30/2017 1815   Lab Results  Component Value Date   WBC 6.5 11/10/2021   NEUTROABS 3.7 11/10/2021   HGB 12.6 11/10/2021   HCT 35.0 (L) 11/10/2021   MCV 89.5 11/10/2021   PLT 182 11/10/2021    Assessment/Plan Cerebral edema ([place]) - Plan: Clinic Appointment Request  Pleomorphic xanthoastrocytoma [facility]) - Plan: Clinic Appointment Request  Lindsey Daniels presents today with new dyspnea on exertion, pleurtic pain with tachypnea and tachycardia.  Constellation of symptoms is concerning for pulmonary embolism, especially given concurrent avastin.    We will hold avastin today and obtain a CT angio ASAP.  Avastin should be held for the following:  ANC less than 1,000  Platelets less than 100,000  LFT or creatinine greater than 2x ULN  If clinical concerns/contraindications develop  Decadron should remain off if tolerated.  Keppra should con't 719m BID.  We will give her a call once scan results are available.  She knows to present to ED if dyspnea worsens acutely.  Otherwise, we ask that MFederico Flakereturn to clinic in 1 weeks for cycle 2/4 avastin, or sooner if needed.  All questions were answered. The patient knows to call the clinic with any problems, questions or concerns. No barriers to learning were detected.  The total time spent in the encounter was 30 minutes and more than 50% was on counseling and review of test results   ZVentura Sellers MD Medical Director of Neuro-Oncology [facility]09/25/23 9:24 AM

## 2021-11-10 NOTE — Telephone Encounter (Signed)
Patient had STAT CT Angio performed this morning.    Per Dr Vaslow:multiple peripheral PE without right heart strain. I will start her on oral anticoagulation, don't think we need to do heparin.   Eliquis starter pack routed to pharmacy.  Attempted to reach patient to review results.  Will continue to try so I can review with her directly.

## 2021-11-15 ENCOUNTER — Other Ambulatory Visit: Payer: Self-pay

## 2021-11-15 DIAGNOSIS — G936 Cerebral edema: Secondary | ICD-10-CM

## 2021-11-17 ENCOUNTER — Inpatient Hospital Stay: Payer: BC Managed Care – PPO

## 2021-11-17 ENCOUNTER — Inpatient Hospital Stay: Payer: BC Managed Care – PPO | Admitting: Internal Medicine

## 2021-11-18 ENCOUNTER — Inpatient Hospital Stay: Payer: BC Managed Care – PPO

## 2021-11-18 ENCOUNTER — Encounter: Payer: Self-pay | Admitting: Internal Medicine

## 2021-11-18 ENCOUNTER — Other Ambulatory Visit: Payer: Self-pay

## 2021-11-18 ENCOUNTER — Inpatient Hospital Stay: Payer: BC Managed Care – PPO | Attending: Radiation Oncology

## 2021-11-18 ENCOUNTER — Inpatient Hospital Stay (HOSPITAL_BASED_OUTPATIENT_CLINIC_OR_DEPARTMENT_OTHER): Payer: BC Managed Care – PPO | Admitting: Internal Medicine

## 2021-11-18 VITALS — BP 161/99 | HR 94

## 2021-11-18 VITALS — BP 169/90 | HR 108 | Temp 97.9°F | Resp 17 | Ht 62.0 in | Wt 173.2 lb

## 2021-11-18 DIAGNOSIS — G936 Cerebral edema: Secondary | ICD-10-CM

## 2021-11-18 DIAGNOSIS — Z79899 Other long term (current) drug therapy: Secondary | ICD-10-CM | POA: Insufficient documentation

## 2021-11-18 DIAGNOSIS — Z5112 Encounter for antineoplastic immunotherapy: Secondary | ICD-10-CM | POA: Diagnosis not present

## 2021-11-18 DIAGNOSIS — C719 Malignant neoplasm of brain, unspecified: Secondary | ICD-10-CM

## 2021-11-18 DIAGNOSIS — Z23 Encounter for immunization: Secondary | ICD-10-CM | POA: Insufficient documentation

## 2021-11-18 DIAGNOSIS — C712 Malignant neoplasm of temporal lobe: Secondary | ICD-10-CM | POA: Diagnosis present

## 2021-11-18 DIAGNOSIS — I2694 Multiple subsegmental pulmonary emboli without acute cor pulmonale: Secondary | ICD-10-CM

## 2021-11-18 DIAGNOSIS — G40909 Epilepsy, unspecified, not intractable, without status epilepticus: Secondary | ICD-10-CM | POA: Diagnosis not present

## 2021-11-18 DIAGNOSIS — I2699 Other pulmonary embolism without acute cor pulmonale: Secondary | ICD-10-CM | POA: Insufficient documentation

## 2021-11-18 LAB — CMP (CANCER CENTER ONLY)
ALT: 37 U/L (ref 0–44)
AST: 30 U/L (ref 15–41)
Albumin: 4 g/dL (ref 3.5–5.0)
Alkaline Phosphatase: 76 U/L (ref 38–126)
Anion gap: 10 (ref 5–15)
BUN: 7 mg/dL — ABNORMAL LOW (ref 8–23)
CO2: 23 mmol/L (ref 22–32)
Calcium: 9.2 mg/dL (ref 8.9–10.3)
Chloride: 105 mmol/L (ref 98–111)
Creatinine: 0.59 mg/dL (ref 0.44–1.00)
GFR, Estimated: >60 mL/min (ref >60)
Glucose, Bld: 144 mg/dL — ABNORMAL HIGH (ref 70–99)
Potassium: 3.3 mmol/L — ABNORMAL LOW (ref 3.5–5.1)
Sodium: 138 mmol/L (ref 135–145)
Total Bilirubin: 1.3 mg/dL — ABNORMAL HIGH (ref 0.3–1.2)
Total Protein: 6.2 g/dL — ABNORMAL LOW (ref 6.5–8.1)

## 2021-11-18 LAB — CBC WITH DIFFERENTIAL (CANCER CENTER ONLY)
Abs Immature Granulocytes: 0.05 10*3/uL (ref 0.00–0.07)
Basophils Absolute: 0 10*3/uL (ref 0.0–0.1)
Basophils Relative: 1 %
Eosinophils Absolute: 0.1 10*3/uL (ref 0.0–0.5)
Eosinophils Relative: 2 %
HCT: 36.9 % (ref 36.0–46.0)
Hemoglobin: 13.2 g/dL (ref 12.0–15.0)
Immature Granulocytes: 1 %
Lymphocytes Relative: 26 %
Lymphs Abs: 1.3 10*3/uL (ref 0.7–4.0)
MCH: 32.1 pg (ref 26.0–34.0)
MCHC: 35.8 g/dL (ref 30.0–36.0)
MCV: 89.8 fL (ref 80.0–100.0)
Monocytes Absolute: 0.6 10*3/uL (ref 0.1–1.0)
Monocytes Relative: 12 %
Neutro Abs: 3 10*3/uL (ref 1.7–7.7)
Neutrophils Relative %: 58 %
Platelet Count: 353 10*3/uL (ref 150–400)
RBC: 4.11 MIL/uL (ref 3.87–5.11)
RDW: 14.6 % (ref 11.5–15.5)
WBC Count: 5 10*3/uL (ref 4.0–10.5)
nRBC: 0 % (ref 0.0–0.2)

## 2021-11-18 MED ORDER — LEVETIRACETAM 750 MG PO TABS: 750.0000 mg | ORAL_TABLET | Freq: Two times a day (BID) | ORAL | 1 refills | Status: DC

## 2021-11-18 MED ORDER — APIXABAN 5 MG PO TABS: 5.0000 mg | ORAL_TABLET | Freq: Two times a day (BID) | ORAL | 2 refills | Status: DC

## 2021-11-18 MED ORDER — SODIUM CHLORIDE 0.9 % IV SOLN
10.0000 mg/kg | Freq: Once | INTRAVENOUS | Status: AC
  Administered 2021-11-18: 800 mg via INTRAVENOUS
  Filled 2021-11-18: qty 32

## 2021-11-18 MED ORDER — SODIUM CHLORIDE 0.9 % IV SOLN: Freq: Once | INTRAVENOUS | Status: AC

## 2021-11-18 NOTE — Progress Notes (Signed)
Per Dr. Mickeal Skinner, okay to treat with elevated blood pressure and heart rate

## 2021-11-18 NOTE — Patient Instructions (Signed)
[facility] MEDICAL [facility]COLOGY  Discharge Instructions: Thank you for choosing [facility] to provide your oncology and hematology care.   If you have a lab appointment with the Cancer Center, please go directly to the Cancer Center and check in at the registration area.   Wear comfortable clothing and clothing appropriate for easy access to any Portacath or PICC line.   We strive to give you quality time with your provider. You may need to reschedule your appointment if you arrive late (15 or more minutes).  Arriving late affects you and other patients whose appointments are after yours.  Also, if you miss three or more appointments without notifying the office, you may be dismissed from the clinic at the provider's discretion.      For prescription refill requests, have your pharmacy contact our office and allow 72 hours for refills to be completed.    Today you received the following chemotherapy and/or immunotherapy agents; Bevacizumab (Zirabev)      To help prevent nausea and vomiting after your treatment, we encourage you to take your nausea medication as directed.  BELOW ARE SYMPTOMS THAT SHOULD BE REPORTED IMMEDIATELY: *FEVER GREATER THAN 100.4 F (38 C) OR HIGHER *CHILLS OR SWEATING *NAUSEA AND VOMITING THAT IS NOT CONTROLLED WITH YOUR NAUSEA MEDICATION *UNUSUAL SHORTNESS OF BREATH *UNUSUAL BRUISING OR BLEEDING *URINARY PROBLEMS (pain or burning when urinating, or frequent urination) *BOWEL PROBLEMS (unusual diarrhea, constipation, pain near the anus) TENDERNESS IN MOUTH AND THROAT WITH OR WITHOUT PRESENCE OF ULCERS (sore throat, sores in mouth, or a toothache) UNUSUAL RASH, SWELLING OR PAIN  UNUSUAL VAGINAL DISCHARGE OR ITCHING   Items with * indicate a potential emergency and should be followed up as soon as possible or go to the Emergency Department if any problems should occur.  Please show the CHEMOTHERAPY ALERT CARD or IMMUNOTHERAPY ALERT CARD at  check-in to the Emergency Department and triage nurse.  Should you have questions after your visit or need to cancel or reschedule your appointment, please contact [facility]  Dept: [phone]  and follow the prompts.  Office hours are 8:00 a.m. to 4:30 p.m. Monday - Friday. Please note that voicemails left after 4:00 p.m. may not be returned until the following business day.  We are closed weekends and major holidays. You have access to a nurse at all times for urgent questions. Please call the main number to the clinic Dept: [phone] and follow the prompts.   For any non-urgent questions, you may also contact your provider using MyChart. We now offer e-Visits for anyone 18 and older to request care online for non-urgent symptoms. For details visit [email or web address].   Also download the MyChart app! Go to the app store, search "MyChart", open the app, select [facility], and log in with your MyChart username and password.  Masks are optional in the cancer centers. If you would like for your care team to wear a mask while they are taking care of you, please let them know. You may have one support Kasi Lasky who is at least 67 years old accompany you for your appointments. 

## 2021-11-18 NOTE — Progress Notes (Signed)
[facility] at [place], [place] [address] [phone]   [place] [facility] Evaluation  Date of Service: 11/18/21 Patient Name: Lindsey Daniels Patient MRN: [id number] Patient DOB: 01-19-1956 Provider: Ventura Sellers, MD  Identifying Statement:  Lindsey Daniels is a 66 y.o. female with right temporal  pleomorphic xanthoastrocytoma    Oncologic History: Oncology History  Pleomorphic xanthoastrocytoma ([place])  09/29/2013 Surgery   Right temporal craniotomy, resection with Dr. Sherwood Gambler.  Path is PXA WHO II.   11/29/2020 Surgery   Disease progression, second craniotomy with Dr. Christella Noa, sub-total resection.  Path is unchanged.   02/21/2021 - 04/02/2021 Radiation Therapy   IMRT radiation therapy with Dr. Lisbeth Renshaw   10/27/2021 -  Chemotherapy   Patient is on Treatment Plan : BRAIN GBM Bevacizumab q14d       Biomarkers:  MGMT Methylated.  IDH 1/2 Wild type.  BRAF V600e mutation detected  TERT Unmutated   Interval History: Lindsey Daniels presents for initial avastin infusion.  She describes clear improvement in dyspnea since starting the Eliquis last week, though she is not quite back to her full energy baseline.  Still off the decadron.  No recurrence of focal symptoms or seizures.  H+P (01/30/21) Patient presents today after recent re-resection for progressive right temporal PXA.  She initially presented with a seizure in 2015; imaging demonstrated enhancing right temporal lesion which was grossly resected by Dr. Sherwood Gambler.  After last follow up in 2020, she began to experience headaches, ear fullness and some confusion this fall.  Imaging demonstrated marked progression of disease; she went for second surgery on 11/29/20.  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   Medications: Current Outpatient Medications on File Prior to Visit  Medication Sig Dispense Refill    amLODipine (NORVASC) 10 MG tablet Take 1 tablet (10 mg total) by mouth daily.     APIXABAN (ELIQUIS) VTE STARTER PACK (10MG AND 5MG) Take as directed on package: start with two-73m tablets twice daily for 7 days. On day 8, switch to one-552mtablet twice daily. 1 each 0   atorvastatin (LIPITOR) 20 MG tablet Take 1 tablet (20 mg total) by mouth daily. 90 tablet 3   cetirizine (ZYRTEC) 10 MG tablet Take 10 mg by mouth daily.     cholecalciferol (VITAMIN D) 1000 units tablet Take 1,000 Units by mouth daily.     fluticasone (FLONASE) 50 MCG/ACT nasal spray Place 1-2 sprays into both nostrils daily. 16 g 0   levETIRAcetam (KEPPRA) 250 MG tablet Take 3 tablets (750 mg total) by mouth 2 (two) times daily. 180 tablet 2   losartan (COZAAR) 50 MG tablet Take 1 tablet (50 mg total) by mouth daily.     Multiple Vitamin (MULTIVITAMIN WITH MINERALS) TABS tablet Take 1 tablet by mouth at bedtime.     pantoprazole (PROTONIX) 40 MG tablet Take 1 tablet (40 mg total) by mouth daily.     sertraline (ZOLOFT) 100 MG tablet Take 1 tablet (100 mg total) by mouth daily. (Patient taking differently: Take 100 mg by mouth at bedtime.) 90 tablet 3   valACYclovir (VALTREX) 500 MG tablet Take 1 tablet (500 mg total) by mouth 2 (two) times daily. Takes only if needed for breakouts (Patient taking differently: Take 500 mg by mouth 2 (two) times daily as needed (for breakout).) 30 tablet prn   No current facility-administered medications on  file prior to visit.    Allergies:  Allergies  Allergen Reactions   Rifapentine Other (See Comments)    Flu-like symptoms   Amoxicillin Rash   Clavulanic Acid Rash   Past Medical History:  Past Medical History:  Diagnosis Date   Allergy    Anal fissure    Anxiety    Cataract    Depression    Elevated LFTs    Hyperglycemia    Hyperlipidemia    Hypertension    Osteoarthritis    Pars defect of lumbar spine    Seizures (HCC)    Spondylolisthesis    lumbar   Past Surgical  History:  Past Surgical History:  Procedure Laterality Date   cataract     CRANIOTOMY N/A 10/02/2013   Procedure: 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;  Surgeon: Hosie Spangle, MD;  Location: [facility];  Service: Neurosurgery;  Laterality: N/A;  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   CRANIOTOMY Right 11/29/2020   Procedure: Right Parietal craniotomy for tumor resection;  Surgeon: Ashok Pall, MD;  Location: [facility];  Service: Neurosurgery;  Laterality: Right;   HEEL SPUR SURGERY     Social History:  Social History   Socioeconomic History   Marital status: Divorced    Spouse name: separated-no papers file   Number of children: 0   Years of education: Not on file   Highest education level: Not on file  Occupational History   Occupation: RT    Employer: [employer] @ [place]: [employer] and [employer]  Tobacco Use   Smoking status: Never   Smokeless tobacco: Never  Substance and Sexual Activity   Alcohol use: Yes    Alcohol/week: 0.0 - 4.0 standard drinks of alcohol    Comment: social   Drug use: No   Sexual activity: Not Currently    Partners: Male  Other Topics Concern   Not on file  Social History Narrative   Separated from second husband.  Very contentious split.   Social Determinants of Health   Financial Resource Strain: Not on file  Food Insecurity: Not on file  Transportation Needs: Not on file  Physical Activity: Not on file  Stress: Not on file  Social Connections: Not on file  Intimate Partner Violence: Not At Risk (02/06/2021)   Humiliation, Afraid, Rape, and Kick questionnaire    Fear of Current or Ex-Partner: No    Emotionally Abused: No    Physically Abused: No    Sexually Abused: No   Family History:  Family History  Problem Relation Age of Onset   Mental illness Father        depression   COPD Father    Alcohol abuse Brother    Breast cancer Neg Hx     Review of  Systems: Constitutional: Doesn't report fevers, chills or abnormal weight loss Eyes: Doesn't report blurriness of vision Ears, nose, mouth, throat, and face: Doesn't report sore throat Respiratory: +dyspnea Cardiovascular: Doesn't report palpitation, chest discomfort  Gastrointestinal:  Doesn't report nausea, constipation, diarrhea GU: Doesn't report incontinence Skin: Doesn't report skin rashes Neurological: Per HPI Musculoskeletal: Doesn't report joint pain Behavioral/Psych: Doesn't report anxiety  Physical Exam: Vitals:   11/18/21 0846  BP: (!) 169/90  Pulse: (!) 108  Resp: 17  Temp: 97.9 F (36.6 C)  SpO2: 97%    KPS: 90. General: cushingoid Head: Normal EENT: No conjunctival injection or scleral icterus.  Lungs: Normal Cardiac: Regular  rate Abdomen: Non-distended abdomen Skin: +purpura, arms and legs Extremities: +edema  Neurologic Exam: Mental Status: Awake, alert, attentive to examiner. Oriented to self and environment. Language is fluent with intact comprehension.  Cranial Nerves: Visual acuity is grossly normal. Visual fields are full. Extra-ocular movements intact. No ptosis. Face is symmetric Motor: Tone and bulk are normal. Power is full in both arms and legs. Reflexes are symmetric, no pathologic reflexes present.  Sensory: Intact to light touch Gait: Normal.   Labs: I have reviewed the data as listed    Component Value Date/Time   NA 138 09/29/2021 0345   NA 142 06/30/2017 1815   K 4.1 09/29/2021 0345   CL 107 09/29/2021 0345   CO2 23 09/29/2021 0345   GLUCOSE 157 (H) 09/29/2021 0345   BUN 21 09/29/2021 0345   BUN 14 06/30/2017 1815   CREATININE 0.66 09/29/2021 0345   CREATININE 0.62 02/06/2021 1121   CREATININE 0.72 06/07/2015 0825   CALCIUM 8.9 09/29/2021 0345   PROT 6.8 09/23/2021 0511   PROT 7.0 06/30/2017 1815   ALBUMIN 4.0 09/23/2021 0511   ALBUMIN 4.6 06/30/2017 1815   AST 16 09/23/2021 0511   ALT 18 09/23/2021 0511   ALKPHOS 68  09/23/2021 0511   BILITOT 1.1 09/23/2021 0511   BILITOT 0.6 06/30/2017 1815   GFRNONAA >60 09/29/2021 0345   GFRNONAA >60 02/06/2021 1121   GFRAA 108 06/30/2017 1815   Lab Results  Component Value Date   WBC 5.0 11/18/2021   NEUTROABS 3.0 11/18/2021   HGB 13.2 11/18/2021   HCT 36.9 11/18/2021   MCV 89.8 11/18/2021   PLT 353 11/18/2021    Assessment/Plan Pleomorphic xanthoastrocytoma (HCC)  Multiple subsegmental pulmonary emboli without acute cor pulmonale (HCC)  Seizure disorder ([place])  Lindsey Daniels is clinically stable today.  Last week's therapy was deferred due to moderately symptomatic DVT/PE, now treated and stabilized with Eliquis.  We will administer avastin today for refractory CNS inflammation and radionecrosis, cycle #2/4  Avastin should be held for the following:  ANC less than 1,000  Platelets less than 100,000  LFT or creatinine greater than 2x ULN  If clinical concerns/contraindications develop  Decadron should remain off if tolerated.  Keppra should con't 738m BID.  Otherwise, we ask that MFederico Flakereturn to clinic in 2 weeks for cycle 3/4 avastin, or sooner if needed.  All questions were answered. The patient knows to call the clinic with any problems, questions or concerns. No barriers to learning were detected.  The total time spent in the encounter was 30 minutes and more than 50% was on counseling and review of test results   ZVentura Sellers MD Medical Director of Neuro-Oncology [facility] WRosa Sanchez10/03/23 8:57 AM

## 2021-11-20 ENCOUNTER — Other Ambulatory Visit: Payer: Self-pay

## 2021-11-21 ENCOUNTER — Other Ambulatory Visit: Payer: Self-pay

## 2021-11-24 ENCOUNTER — Other Ambulatory Visit: Payer: BC Managed Care – PPO

## 2021-11-24 ENCOUNTER — Ambulatory Visit: Payer: BC Managed Care – PPO | Admitting: Internal Medicine

## 2021-11-24 ENCOUNTER — Inpatient Hospital Stay: Payer: BC Managed Care – PPO

## 2021-11-26 ENCOUNTER — Other Ambulatory Visit: Payer: Self-pay

## 2021-12-02 ENCOUNTER — Other Ambulatory Visit: Payer: Self-pay | Admitting: *Deleted

## 2021-12-02 ENCOUNTER — Inpatient Hospital Stay (HOSPITAL_BASED_OUTPATIENT_CLINIC_OR_DEPARTMENT_OTHER): Payer: BC Managed Care – PPO | Admitting: Internal Medicine

## 2021-12-02 ENCOUNTER — Inpatient Hospital Stay: Payer: BC Managed Care – PPO

## 2021-12-02 ENCOUNTER — Other Ambulatory Visit: Payer: Self-pay

## 2021-12-02 VITALS — BP 147/89 | HR 108 | Temp 98.1°F | Resp 17 | Ht 62.0 in | Wt 174.5 lb

## 2021-12-02 VITALS — BP 165/99 | HR 93 | Temp 98.9°F | Resp 16

## 2021-12-02 DIAGNOSIS — Z23 Encounter for immunization: Secondary | ICD-10-CM

## 2021-12-02 DIAGNOSIS — C719 Malignant neoplasm of brain, unspecified: Secondary | ICD-10-CM

## 2021-12-02 DIAGNOSIS — G936 Cerebral edema: Secondary | ICD-10-CM | POA: Diagnosis not present

## 2021-12-02 DIAGNOSIS — Z5112 Encounter for antineoplastic immunotherapy: Secondary | ICD-10-CM | POA: Diagnosis not present

## 2021-12-02 LAB — CBC WITH DIFFERENTIAL (CANCER CENTER ONLY)
Abs Immature Granulocytes: 0.01 10*3/uL (ref 0.00–0.07)
Basophils Absolute: 0 10*3/uL (ref 0.0–0.1)
Basophils Relative: 1 %
Eosinophils Absolute: 0.1 10*3/uL (ref 0.0–0.5)
Eosinophils Relative: 2 %
HCT: 37.8 % (ref 36.0–46.0)
Hemoglobin: 13 g/dL (ref 12.0–15.0)
Immature Granulocytes: 0 %
Lymphocytes Relative: 30 %
Lymphs Abs: 1.5 10*3/uL (ref 0.7–4.0)
MCH: 30.9 pg (ref 26.0–34.0)
MCHC: 34.4 g/dL (ref 30.0–36.0)
MCV: 89.8 fL (ref 80.0–100.0)
Monocytes Absolute: 0.6 10*3/uL (ref 0.1–1.0)
Monocytes Relative: 12 %
Neutro Abs: 2.8 10*3/uL (ref 1.7–7.7)
Neutrophils Relative %: 55 %
Platelet Count: 264 10*3/uL (ref 150–400)
RBC: 4.21 MIL/uL (ref 3.87–5.11)
RDW: 13.7 % (ref 11.5–15.5)
WBC Count: 5.1 10*3/uL (ref 4.0–10.5)
nRBC: 0 % (ref 0.0–0.2)

## 2021-12-02 LAB — CMP (CANCER CENTER ONLY)
ALT: 23 U/L (ref 0–44)
AST: 29 U/L (ref 15–41)
Albumin: 3.7 g/dL (ref 3.5–5.0)
Alkaline Phosphatase: 72 U/L (ref 38–126)
Anion gap: 11 (ref 5–15)
BUN: 6 mg/dL — ABNORMAL LOW (ref 8–23)
CO2: 25 mmol/L (ref 22–32)
Calcium: 9 mg/dL (ref 8.9–10.3)
Chloride: 106 mmol/L (ref 98–111)
Creatinine: 0.55 mg/dL (ref 0.44–1.00)
GFR, Estimated: >60 mL/min (ref >60)
Glucose, Bld: 129 mg/dL — ABNORMAL HIGH (ref 70–99)
Potassium: 3.2 mmol/L — ABNORMAL LOW (ref 3.5–5.1)
Sodium: 142 mmol/L (ref 135–145)
Total Bilirubin: 1.4 mg/dL — ABNORMAL HIGH (ref 0.3–1.2)
Total Protein: 6 g/dL — ABNORMAL LOW (ref 6.5–8.1)

## 2021-12-02 LAB — TOTAL PROTEIN, URINE DIPSTICK: Protein, ur: 30 mg/dL — AB

## 2021-12-02 MED ORDER — SODIUM CHLORIDE 0.9 % IV SOLN
10.0000 mg/kg | Freq: Once | INTRAVENOUS | Status: AC
  Administered 2021-12-02: 800 mg via INTRAVENOUS
  Filled 2021-12-02: qty 32

## 2021-12-02 MED ORDER — SODIUM CHLORIDE 0.9 % IV SOLN: Freq: Once | INTRAVENOUS | Status: AC

## 2021-12-02 MED ORDER — INFLUENZA VAC A&B SA ADJ QUAD 0.5 ML IM PRSY
0.5000 mL | PREFILLED_SYRINGE | Freq: Once | INTRAMUSCULAR | Status: AC
  Administered 2021-12-02: 0.5 mL via INTRAMUSCULAR
  Filled 2021-12-02: qty 0.5

## 2021-12-02 NOTE — Patient Instructions (Signed)
[facility]ICAL [facility]COLOG[facility]  Discharge Instructions: Thank you for choosing [facility] to provide your oncology and hematology care.   If you have a lab appointment with the Cancer Center, please go directly to the Cancer Center and check in at the registration area.   Wear comfortable clothing and clothing appropriate for easy access to any Portacath or PICC line.   We strive to give you quality time with your provider. You may need to reschedule your appointment if you arrive late (15 or more minutes).  Arriving late affects you and other patients whose appointments are after yours.  Also, if you miss three or more appointments without notifying the office, you may be dismissed from the clinic at the provider's discretion.      For prescription refill requests, have your pharmacy contact our office and allow 72 hours for refills to be completed.    Today you received the following chemotherapy and/or immunotherapy agents; Bevacizumab (Zirabev)      To help prevent nausea and vomiting after your treatment, we encourage you to take your nausea medication as directed.  BELOW ARE SYMPTOMS THAT SHOULD BE REPORTED IMMEDIATELY: *FEVER GREATER THAN 100.4 F (38 C) OR HIGHER *CHILLS OR SWEATING *NAUSEA AND VOMITING THAT IS NOT CONTROLLED WITH YOUR NAUSEA MEDICATION *UNUSUAL SHORTNESS OF BREATH *UNUSUAL BRUISING OR BLEEDING *URINARY PROBLEMS (pain or burning when urinating, or frequent urination) *BOWEL PROBLEMS (unusual diarrhea, constipation, pain near the anus) TENDERNESS IN MOUTH AND THROAT WITH OR WITHOUT PRESENCE OF ULCERS (sore throat, sores in mouth, or a toothache) UNUSUAL RASH, SWELLING OR PAIN  UNUSUAL VAGINAL DISCHARGE OR ITCHING   Items with * indicate a potential emergency and should be followed up as soon as possible or go to the Emergency Department if any problems should occur.  Please show the CHEMOTHERAPY ALERT CARD or IMMUNOTHERAPY ALERT CARD at  check-in to the Emergency Department and triage nurse.  Should you have questions after your visit or need to cancel or reschedule your appointment, please contact [facility]  Dept: [phone]  and follow the prompts.  Office hours are 8:00 a.m. to 4:30 p.m. Monday - Friday. Please note that voicemails left after 4:00 p.m. may not be returned until the following business day.  We are closed weekends and major holidays. You have access to a nurse at all times for urgent questions. Please call the main number to the clinic Dept: [phone] and follow the prompts.   For any non-urgent questions, you may also contact your provider using MyChart. We now offer e-Visits for anyone 18 and older to request care online for non-urgent symptoms. For details visit [email or web address].   Also download the MyChart app! Go to the app store, search "MyChart", open the app, select [facility], and log in with your MyChart username and password.  Masks are optional in the cancer centers. If you would like for your care team to wear a mask while they are taking care of you, please let them know. You may have one support person who is at least 67 years old accompany you for your appointments. 

## 2021-12-02 NOTE — Progress Notes (Signed)
[facility] [place], [place] [address] [phone]   [facility] Evaluation  Date of Service: 12/02/21 Patient Name: Lindsey Daniels Patient MRN: [id number] Patient DOB: 1955/12/19 Provider: Ventura Sellers, MD  Identifying Statement:  Lindsey Daniels is a 66 y.o. female with right temporal  pleomorphic xanthoastrocytoma    Oncologic History: Oncology History  Pleomorphic xanthoastrocytoma ([place])  09/29/2013 Surgery   Right temporal craniotomy, resection with Dr. Sherwood Gambler.  Path is PXA WHO II.   11/29/2020 Surgery   Disease progression, second craniotomy with Dr. Christella Noa, sub-total resection.  Path is unchanged.   02/21/2021 - 04/02/2021 Radiation Therapy   IMRT radiation therapy with Dr. Lisbeth Renshaw   10/27/2021 -  Chemotherapy   Patient is on Treatment Plan : BRAIN GBM Bevacizumab q14d       Biomarkers:  MGMT Methylated.  IDH 1/2 Wild type.  BRAF V600e mutation detected  TERT Unmutated   Interval History: Lindsey Daniels presents for initial avastin infusion.  Still complains of fatigue, shortness of breath somewhat better.  Still off the decadron.  No recurrence of focal symptoms or seizures.  H+P (01/30/21) Patient presents today after recent re-resection for progressive right temporal PXA.  She initially presented with a seizure in 2015; imaging demonstrated enhancing right temporal lesion which was grossly resected by Dr. Sherwood Gambler.  After last follow up in 2020, she began to experience headaches, ear fullness and some confusion this fall.  Imaging demonstrated marked progression of disease; she went for second surgery on 11/29/20.  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   Medications: Current Outpatient Medications on File Prior to Visit  Medication Sig Dispense Refill   amLODipine (NORVASC) 10 MG tablet Take 1 tablet (10 mg total) by mouth  daily.     apixaban (ELIQUIS) 5 MG TABS tablet Take 1 tablet (5 mg total) by mouth 2 (two) times daily. 60 tablet 2   atorvastatin (LIPITOR) 20 MG tablet Take 1 tablet (20 mg total) by mouth daily. 90 tablet 3   cetirizine (ZYRTEC) 10 MG tablet Take 10 mg by mouth daily.     cholecalciferol (VITAMIN D) 1000 units tablet Take 1,000 Units by mouth daily.     fluticasone (FLONASE) 50 MCG/ACT nasal spray Place 1-2 sprays into both nostrils daily. 16 g 0   levETIRAcetam (KEPPRA) 750 MG tablet Take 1 tablet (750 mg total) by mouth 2 (two) times daily. 120 tablet 1   losartan (COZAAR) 50 MG tablet Take 1 tablet (50 mg total) by mouth daily.     Multiple Vitamin (MULTIVITAMIN WITH MINERALS) TABS tablet Take 1 tablet by mouth at bedtime.     pantoprazole (PROTONIX) 40 MG tablet Take 1 tablet (40 mg total) by mouth daily.     sertraline (ZOLOFT) 100 MG tablet Take 1 tablet (100 mg total) by mouth daily. (Patient taking differently: Take 100 mg by mouth at bedtime.) 90 tablet 3   valACYclovir (VALTREX) 500 MG tablet Take 1 tablet (500 mg total) by mouth 2 (two) times daily. Takes only if needed for breakouts (Patient not taking: Reported on 12/02/2021) 30 tablet prn   No current facility-administered medications on file prior to visit.    Allergies:  Allergies  Allergen Reactions   Rifapentine Other (See Comments)    Flu-like symptoms   Amoxicillin Rash   Clavulanic Acid Rash   Past Medical  History:  Past Medical History:  Diagnosis Date   Allergy    Anal fissure    Anxiety    Cataract    Depression    Elevated LFTs    Hyperglycemia    Hyperlipidemia    Hypertension    Osteoarthritis    Pars defect of lumbar spine    Seizures (H[facility])    Spondylolisthesis    lumbar   Past Surgical History:  Past Surgical History:  Procedure Laterality Date   cataract     CRANIOTOMY N/A 10/02/2013   Procedure: 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;  Surgeon: Hosie Spangle, MD;  Location:  [facility];  Service: Neurosurgery;  Laterality: N/A;  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   CRANIOTOMY Right 11/29/2020   Procedure: Right Parietal craniotomy for tumor resection;  Surgeon: Ashok Pall, MD;  Location: [facility];  Service: Neurosurgery;  Laterality: Right;   HEEL SPUR SURGERY     Social History:  Social History   Socioeconomic History   Marital status: Divorced    Spouse name: separated-no papers file   Number of children: 0   Years of education: Not on file   Highest education level: Not on file  Occupational History   Occupation: RT    Employer: [employer] @ [place]: [employer] and [employer]  Tobacco Use   Smoking status: Never   Smokeless tobacco: Never  Substance and Sexual Activity   Alcohol use: Yes    Alcohol/week: 0.0 - 4.0 standard drinks of alcohol    Comment: social   Drug use: No   Sexual activity: Not Currently    Partners: Male  Other Topics Concern   Not on file  Social History Narrative   Separated from second husband.  Very contentious split.   Social Determinants of Health   Financial Resource Strain: Not on file  Food Insecurity: Not on file  Transportation Needs: Not on file  Physical Activity: Not on file  Stress: Not on file  Social Connections: Not on file  Intimate Partner Violence: Not At Risk (02/06/2021)   Humiliation, Afraid, Rape, and Kick questionnaire    Fear of Current or Ex-Partner: No    Emotionally Abused: No    Physically Abused: No    Sexually Abused: No   Family History:  Family History  Problem Relation Age of Onset   Mental illness Father        depression   COPD Father    Alcohol abuse Brother    Breast cancer Neg Hx     Review of Systems: Constitutional: Doesn't report fevers, chills or abnormal weight loss Eyes: Doesn't report blurriness of vision Ears, nose, mouth, throat, and face: Doesn't report sore throat Respiratory:  +dyspnea Cardiovascular: Doesn't report palpitation, chest discomfort  Gastrointestinal:  Doesn't report nausea, constipation, diarrhea GU: Doesn't report incontinence Skin: Doesn't report skin rashes Neurological: Per HPI Musculoskeletal: Doesn't report joint pain Behavioral/Psych: Doesn't report anxiety  Physical Exam: Vitals:   12/02/21 0939  BP: (!) 147/89  Pulse: (!) 108  Resp: 17  Temp: 98.1 F (36.7 C)  SpO2: 97%    KPS: 90. General: cushingoid Head: Normal EENT: No conjunctival injection or scleral icterus.  Lungs: Normal Cardiac: Regular rate Abdomen: Non-distended abdomen Skin: +purpura, arms and legs Extremities: +edema  Neurologic Exam: Mental Status: Awake, alert, attentive to examiner. Oriented to self and environment. Language is fluent with intact comprehension.  Cranial Nerves: Visual acuity  is grossly normal. Visual fields are full. Extra-ocular movements intact. No ptosis. Face is symmetric Motor: Tone and bulk are normal. Power is full in both arms and legs. Reflexes are symmetric, no pathologic reflexes present.  Sensory: Intact to light touch Gait: Normal.   Labs: I have reviewed the data as listed    Component Value Date/Time   NA 138 11/18/2021 0841   NA 142 06/30/2017 1815   K 3.3 (L) 11/18/2021 0841   CL 105 11/18/2021 0841   CO2 23 11/18/2021 0841   GLUCOSE 144 (H) 11/18/2021 0841   BUN 7 (L) 11/18/2021 0841   BUN 14 06/30/2017 1815   CREATININE 0.59 11/18/2021 0841   CREATININE 0.72 06/07/2015 0825   CALCIUM 9.2 11/18/2021 0841   PROT 6.2 (L) 11/18/2021 0841   PROT 7.0 06/30/2017 1815   ALBUMIN 4.0 11/18/2021 0841   ALBUMIN 4.6 06/30/2017 1815   AST 30 11/18/2021 0841   ALT 37 11/18/2021 0841   ALKPHOS 76 11/18/2021 0841   BILITOT 1.3 (H) 11/18/2021 0841   GFRNONAA >60 11/18/2021 0841   GFRAA 108 06/30/2017 1815   Lab Results  Component Value Date   WBC 5.1 12/02/2021   NEUTROABS 2.8 12/02/2021   HGB 13.0 12/02/2021    HCT 37.8 12/02/2021   MCV 89.8 12/02/2021   PLT 264 12/02/2021    Assessment/Plan No diagnosis found.  Lindsey Daniels is clinically stable today.  Labs demonstrate mild proteinuria.  We will administer avastin today for refractory CNS inflammation and radionecrosis, cycle #3/4  Avastin should be held for the following:  ANC less than 1,000  Platelets less than 100,000  LFT or creatinine greater than 2x ULN  If clinical concerns/contraindications develop  Decadron should remain off if tolerated.  Keppra should con't 746m BID.  Otherwise, we ask that Lindsey Bruckreturn to clinic in 2 weeks for cycle 4/4 avastin, or sooner if needed.  We will schedule MRI study following 4th infusion.  All questions were answered. The patient knows to call the clinic with any problems, questions or concerns. No barriers to learning were detected.  The total time spent in the encounter was 30 minutes and more than 50% was on counseling and review of test results   ZVentura Sellers MD Medical Director of Neuro-Oncology [facility]10/17/23 9:49 AM

## 2021-12-04 ENCOUNTER — Other Ambulatory Visit: Payer: Self-pay

## 2021-12-10 ENCOUNTER — Telehealth: Payer: Self-pay

## 2021-12-10 NOTE — Telephone Encounter (Signed)
Patient requested flu vaccine administration proof. Patient confirmed receipt of e-mail.

## 2021-12-16 ENCOUNTER — Inpatient Hospital Stay (HOSPITAL_BASED_OUTPATIENT_CLINIC_OR_DEPARTMENT_OTHER): Payer: BC Managed Care – PPO | Admitting: Internal Medicine

## 2021-12-16 ENCOUNTER — Inpatient Hospital Stay: Payer: BC Managed Care – PPO

## 2021-12-16 VITALS — BP 161/101 | HR 102 | Resp 17 | Wt 168.3 lb

## 2021-12-16 VITALS — BP 152/75 | HR 76 | Resp 18

## 2021-12-16 DIAGNOSIS — G936 Cerebral edema: Secondary | ICD-10-CM

## 2021-12-16 DIAGNOSIS — C719 Malignant neoplasm of brain, unspecified: Secondary | ICD-10-CM

## 2021-12-16 DIAGNOSIS — Z5112 Encounter for antineoplastic immunotherapy: Secondary | ICD-10-CM | POA: Diagnosis not present

## 2021-12-16 LAB — CBC WITH DIFFERENTIAL (CANCER CENTER ONLY)
Abs Immature Granulocytes: 0.02 10*3/uL (ref 0.00–0.07)
Basophils Absolute: 0.1 10*3/uL (ref 0.0–0.1)
Basophils Relative: 1 %
Eosinophils Absolute: 0.2 10*3/uL (ref 0.0–0.5)
Eosinophils Relative: 4 %
HCT: 42.5 % (ref 36.0–46.0)
Hemoglobin: 14.1 g/dL (ref 12.0–15.0)
Immature Granulocytes: 0 %
Lymphocytes Relative: 29 %
Lymphs Abs: 1.6 10*3/uL (ref 0.7–4.0)
MCH: 30.8 pg (ref 26.0–34.0)
MCHC: 33.2 g/dL (ref 30.0–36.0)
MCV: 92.8 fL (ref 80.0–100.0)
Monocytes Absolute: 0.6 10*3/uL (ref 0.1–1.0)
Monocytes Relative: 11 %
Neutro Abs: 3 10*3/uL (ref 1.7–7.7)
Neutrophils Relative %: 55 %
Platelet Count: 273 10*3/uL (ref 150–400)
RBC: 4.58 MIL/uL (ref 3.87–5.11)
RDW: 13.8 % (ref 11.5–15.5)
WBC Count: 5.5 10*3/uL (ref 4.0–10.5)
nRBC: 0 % (ref 0.0–0.2)

## 2021-12-16 LAB — CMP (CANCER CENTER ONLY)
ALT: 18 U/L (ref 0–44)
AST: 34 U/L (ref 15–41)
Albumin: 4 g/dL (ref 3.5–5.0)
Alkaline Phosphatase: 70 U/L (ref 38–126)
Anion gap: 12 (ref 5–15)
BUN: 7 mg/dL — ABNORMAL LOW (ref 8–23)
CO2: 24 mmol/L (ref 22–32)
Calcium: 9.5 mg/dL (ref 8.9–10.3)
Chloride: 105 mmol/L (ref 98–111)
Creatinine: 0.5 mg/dL (ref 0.44–1.00)
GFR, Estimated: >60 mL/min (ref >60)
Glucose, Bld: 109 mg/dL — ABNORMAL HIGH (ref 70–99)
Potassium: 3.5 mmol/L (ref 3.5–5.1)
Sodium: 141 mmol/L (ref 135–145)
Total Bilirubin: 1.2 mg/dL (ref 0.3–1.2)
Total Protein: 6.6 g/dL (ref 6.5–8.1)

## 2021-12-16 LAB — TOTAL PROTEIN, URINE DIPSTICK: Protein, ur: 30 mg/dL — AB

## 2021-12-16 MED ORDER — SODIUM CHLORIDE 0.9 % IV SOLN
10.0000 mg/kg | Freq: Once | INTRAVENOUS | Status: AC
  Administered 2021-12-16: 800 mg via INTRAVENOUS
  Filled 2021-12-16: qty 32

## 2021-12-16 MED ORDER — SODIUM CHLORIDE 0.9 % IV SOLN: Freq: Once | INTRAVENOUS | Status: AC

## 2021-12-16 NOTE — Patient Instructions (Signed)
[facility] ONCOLOGY  Discharge Instructions: Thank you for choosing [facility] to provide your oncology and hematology care.   If you have a lab appointment with the [facility], please go directly to the [facility] and check in at the registration area.   Wear comfortable clothing and clothing appropriate for easy access to any Portacath or PICC line.   We strive to give you quality time with your provider. You may need to reschedule your appointment if you arrive late (15 or more minutes).  Arriving late affects you and other patients whose appointments are after yours.  Also, if you miss three or more appointments without notifying the office, you may be dismissed from the clinic at the provider's discretion.      For prescription refill requests, have your pharmacy contact our office and allow 72 hours for refills to be completed.    Today you received the following chemotherapy and/or immunotherapy agents; Bevacizumab [facility])      To help prevent nausea and vomiting after your treatment, we encourage you to take your nausea medication as directed.  BELOW ARE SYMPTOMS THAT SHOULD BE REPORTED IMMEDIATELY: *FEVER GREATER THAN 100.4 F (38 C) OR HIGHER *CHILLS OR SWEATING *NAUSEA AND VOMITING THAT IS NOT CONTROLLED WITH YOUR NAUSEA MEDICATION *UNUSUAL SHORTNESS OF BREATH *UNUSUAL BRUISING OR BLEEDING *URINARY PROBLEMS (pain or burning when urinating, or frequent urination) *BOWEL PROBLEMS (unusual diarrhea, constipation, pain near the anus) TENDERNESS IN MOUTH AND THROAT WITH OR WITHOUT PRESENCE OF ULCERS (sore throat, sores in mouth, or a toothache) UNUSUAL RASH, SWELLING OR PAIN  UNUSUAL VAGINAL DISCHARGE OR ITCHING   Items with * indicate a potential emergency and should be followed up as soon as possible or go to the Emergency Department if any problems should occur.  Please show the CHEMOTHERAPY ALERT CARD or IMMUNOTHERAPY ALERT CARD at  check-in to the Emergency Department and triage nurse.  Should you have questions after your visit or need to cancel or reschedule your appointment, please contact [facility]  Dept: [phone]  and follow the prompts.  Office hours are 8:00 a.m. to 4:30 p.m. Monday - Friday. Please note that voicemails left after 4:00 p.m. may not be returned until the following business day.  We are closed weekends and major holidays. You have access to a nurse at all times for urgent questions. Please call the main number to the clinic Dept: [phone] and follow the prompts.   For any non-urgent questions, you may also contact your provider using MyChart. We now offer e-Visits for anyone 36 and older to request care online for non-urgent symptoms. For details visit [email or web address].   Also download the MyChart app! Go to the app store, search "MyChart", open the app, select [facility], and log in with your MyChart username and password.  Masks are optional in the cancer centers. If you would like for your care team to wear a mask while they are taking care of you, please let them know. You may have one support person who is at least 68 years old accompany you for your appointments.

## 2021-12-16 NOTE — Progress Notes (Signed)
[place] at [place], [place] [address] [phone]   [place] [facility] Evaluation  Date of Service: 12/16/21 Patient Name: Lindsey Daniels Patient MRN: [id number] Patient DOB: [birth year] Provider: Ventura Sellers, MD  Identifying Statement:  Lindsey Daniels is a 66 y.o. female with right temporal  pleomorphic xanthoastrocytoma    Oncologic History: Oncology History  Pleomorphic xanthoastrocytoma ([place])  09/29/2013 Surgery   Right temporal craniotomy, resection with Dr. Sherwood Gambler.  Path is PXA WHO II.   11/29/2020 Surgery   Disease progression, second craniotomy with Dr. Christella Noa, sub-total resection.  Path is unchanged.   02/21/2021 - 04/02/2021 Radiation Therapy   IMRT radiation therapy with Dr. Lisbeth Renshaw   10/27/2021 -  Chemotherapy   Patient is on Treatment Plan : BRAIN GBM Bevacizumab q14d       Biomarkers:  MGMT Methylated.  IDH 1/2 Wild type.  BRAF V600e mutation detected  TERT Unmutated   Interval History: Lindsey Daniels presents for initial avastin infusion.  Fatigue is stable overall, continues on the eliquis.  Still off the decadron.  No recurrence of focal symptoms or seizures.  H+P (01/30/21) Patient presents today after recent re-resection for progressive right temporal PXA.  She initially presented with a seizure in 2015; imaging demonstrated enhancing right temporal lesion which was grossly resected by Dr. Sherwood Gambler.  After last follow up in 2020, she began to experience headaches, ear fullness and some confusion this fall.  Imaging demonstrated marked progression of disease; she went for second surgery on 11/29/20.  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   Medications: Current Outpatient Medications on File Prior to Visit  Medication Sig Dispense Refill   amLODipine (NORVASC) 10 MG tablet Take 1 tablet (10 mg total) by mouth daily.      apixaban (ELIQUIS) 5 MG TABS tablet Take 1 tablet (5 mg total) by mouth 2 (two) times daily. 60 tablet 2   atorvastatin (LIPITOR) 20 MG tablet Take 1 tablet (20 mg total) by mouth daily. 90 tablet 3   cetirizine (ZYRTEC) 10 MG tablet Take 10 mg by mouth daily.     cholecalciferol (VITAMIN D) 1000 units tablet Take 1,000 Units by mouth daily.     fluticasone (FLONASE) 50 MCG/ACT nasal spray Place 1-2 sprays into both nostrils daily. 16 g 0   levETIRAcetam (KEPPRA) 750 MG tablet Take 1 tablet (750 mg total) by mouth 2 (two) times daily. 120 tablet 1   losartan (COZAAR) 50 MG tablet Take 1 tablet (50 mg total) by mouth daily.     Multiple Vitamin (MULTIVITAMIN WITH MINERALS) TABS tablet Take 1 tablet by mouth at bedtime.     pantoprazole (PROTONIX) 40 MG tablet Take 1 tablet (40 mg total) by mouth daily.     sertraline (ZOLOFT) 100 MG tablet Take 1 tablet (100 mg total) by mouth daily. (Patient taking differently: Take 100 mg by mouth at bedtime.) 90 tablet 3   valACYclovir (VALTREX) 500 MG tablet Take 1 tablet (500 mg total) by mouth 2 (two) times daily. Takes only if needed for breakouts (Patient not taking: Reported on 12/02/2021) 30 tablet prn   No current facility-administered medications on file prior to visit.    Allergies:  Allergies  Allergen Reactions   Rifapentine Other (See Comments)    Flu-like symptoms   Amoxicillin Rash   Clavulanic Acid Rash   Past Medical History:  Past Medical History:  Diagnosis Date   Allergy    Anal fissure    Anxiety    Cataract    Depression    Elevated LFTs    Hyperglycemia    Hyperlipidemia    Hypertension    Osteoarthritis    Pars defect of lumbar spine    Seizures (HCC)    Spondylolisthesis    lumbar   Past Surgical History:  Past Surgical History:  Procedure Laterality Date   cataract     CRANIOTOMY N/A 10/02/2013   Procedure: 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;  Surgeon: Hosie Spangle, MD;  Location: [facility];   Service: Neurosurgery;  Laterality: N/A;  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   CRANIOTOMY Right 11/29/2020   Procedure: Right Parietal craniotomy for tumor resection;  Surgeon: Ashok Pall, MD;  Location: [facility];  Service: Neurosurgery;  Laterality: Right;   HEEL SPUR SURGERY     Social History:  Social History   Socioeconomic History   Marital status: Divorced    Spouse name: separated-no papers file   Number of children: 0   Years of education: Not on file   Highest education level: Not on file  Occupational History   Occupation: RT    Employer: [employer] @ [place]: [employer] and [place]  Tobacco Use   Smoking status: Never   Smokeless tobacco: Never  Substance and Sexual Activity   Alcohol use: Yes    Alcohol/week: 0.0 - 4.0 standard drinks of alcohol    Comment: social   Drug use: No   Sexual activity: Not Currently    Partners: Male  Other Topics Concern   Not on file  Social History Narrative   Separated from second husband.  Very contentious split.   Social Determinants of Health   Financial Resource Strain: Not on file  Food Insecurity: Not on file  Transportation Needs: Not on file  Physical Activity: Not on file  Stress: Not on file  Social Connections: Not on file  Intimate Partner Violence: Not At Risk (02/06/2021)   Humiliation, Afraid, Rape, and Kick questionnaire    Fear of Current or Ex-Partner: No    Emotionally Abused: No    Physically Abused: No    Sexually Abused: No   Family History:  Family History  Problem Relation Age of Onset   Mental illness Father        depression   COPD Father    Alcohol abuse Brother    Breast cancer Neg Hx     Review of Systems: Constitutional: Doesn't report fevers, chills or abnormal weight loss Eyes: Doesn't report blurriness of vision Ears, nose, mouth, throat, and face: Doesn't report sore throat Respiratory: +dyspnea Cardiovascular: Doesn't  report palpitation, chest discomfort  Gastrointestinal:  Doesn't report nausea, constipation, diarrhea GU: Doesn't report incontinence Skin: Doesn't report skin rashes Neurological: Per HPI Musculoskeletal: Doesn't report joint pain Behavioral/Psych: Doesn't report anxiety  Physical Exam: Vitals:   12/16/21 1036  BP: (!) 161/101  Pulse: (!) 102  Resp: 17  SpO2: 97%    KPS: 90. General: cushingoid Head: Normal EENT: No conjunctival injection or scleral icterus.  Lungs: Normal Cardiac: Regular rate Abdomen: Non-distended abdomen Skin: +purpura, arms and legs Extremities: +edema  Neurologic Exam: Mental Status: Awake, alert, attentive to examiner. Oriented to self and environment. Language is fluent with intact comprehension.  Cranial Nerves: Visual acuity is grossly normal. Visual fields are full. Extra-ocular  movements intact. No ptosis. Face is symmetric Motor: Tone and bulk are normal. Power is full in both arms and legs. Reflexes are symmetric, no pathologic reflexes present.  Sensory: Intact to light touch Gait: Normal.   Labs: I have reviewed the data as listed    Component Value Date/Time   NA 141 12/16/2021 0954   NA 142 06/30/2017 1815   K 3.5 12/16/2021 0954   CL 105 12/16/2021 0954   CO2 24 12/16/2021 0954   GLUCOSE 109 (H) 12/16/2021 0954   BUN 7 (L) 12/16/2021 0954   BUN 14 06/30/2017 1815   CREATININE 0.50 12/16/2021 0954   CREATININE 0.72 06/07/2015 0825   CALCIUM 9.5 12/16/2021 0954   PROT 6.6 12/16/2021 0954   PROT 7.0 06/30/2017 1815   ALBUMIN 4.0 12/16/2021 0954   ALBUMIN 4.6 06/30/2017 1815   AST 34 12/16/2021 0954   ALT 18 12/16/2021 0954   ALKPHOS 70 12/16/2021 0954   BILITOT 1.2 12/16/2021 0954   GFRNONAA >60 12/16/2021 0954   GFRAA 108 06/30/2017 1815   Lab Results  Component Value Date   WBC 5.5 12/16/2021   NEUTROABS 3.0 12/16/2021   HGB 14.1 12/16/2021   HCT 42.5 12/16/2021   MCV 92.8 12/16/2021   PLT 273 12/16/2021     Assessment/Plan Pleomorphic xanthoastrocytoma ([place]) - Plan: Infusion Appointment Request, Clinic Appointment Request, Lab Appointment Request, CBC with Differential ([facility] Only), Clinic Appointment Request  Cerebral edema [facility]) - Plan: Infusion Appointment Request, Clinic Appointment Request, Lab Appointment Request, CBC with Differential ([place] Only), Clinic Appointment Request  Lindsey Daniels is clinically stable today.  No new or progressive changes. Labs demonstrate mild proteinuria.  We will administer avastin today for refractory CNS inflammation and radionecrosis, cycle #4/4  Avastin should be held for the following:  ANC less than 1,000  Platelets less than 100,000  LFT or creatinine greater than 2x ULN  If clinical concerns/contraindications develop  Decadron should remain off if tolerated.  Keppra should con't 740m BID.  Otherwise, we ask that MFederico Flakereturn to clinic in 2 weeks with MRI brain for evaluation, or sooner if needed.    All questions were answered. The patient knows to call the clinic with any problems, questions or concerns. No barriers to learning were detected.  The total time spent in the encounter was 30 minutes and more than 50% was on counseling and review of test results   ZVentura Sellers MD Medical Director of Neuro-Oncology [facility] WKelso10/31/23 10:47 AM

## 2021-12-18 ENCOUNTER — Other Ambulatory Visit: Payer: Self-pay | Admitting: Radiation Therapy

## 2021-12-23 ENCOUNTER — Other Ambulatory Visit: Payer: Self-pay

## 2021-12-24 ENCOUNTER — Ambulatory Visit (HOSPITAL_COMMUNITY)
Admission: RE | Admit: 2021-12-24 | Discharge: 2021-12-24 | Disposition: A | Discharge status: Home / Self Care | Payer: BC Managed Care – PPO | Source: Ambulatory Visit | Attending: Internal Medicine | Admitting: Internal Medicine

## 2021-12-24 DIAGNOSIS — C719 Malignant neoplasm of brain, unspecified: Secondary | ICD-10-CM | POA: Diagnosis present

## 2021-12-24 DIAGNOSIS — G936 Cerebral edema: Secondary | ICD-10-CM | POA: Diagnosis present

## 2021-12-24 MED ORDER — GADOBUTROL 1 MMOL/ML IV SOLN
7.5000 mL | Freq: Once | INTRAVENOUS | Status: AC | PRN
  Administered 2021-12-24: 7.5 mL via INTRAVENOUS

## 2021-12-29 ENCOUNTER — Inpatient Hospital Stay: Payer: BC Managed Care – PPO | Attending: Radiation Oncology

## 2021-12-29 DIAGNOSIS — Z79899 Other long term (current) drug therapy: Secondary | ICD-10-CM | POA: Insufficient documentation

## 2021-12-29 DIAGNOSIS — C711 Malignant neoplasm of frontal lobe: Secondary | ICD-10-CM | POA: Insufficient documentation

## 2021-12-30 ENCOUNTER — Other Ambulatory Visit: Payer: Self-pay | Admitting: Radiation Therapy

## 2021-12-30 ENCOUNTER — Inpatient Hospital Stay: Payer: BC Managed Care – PPO

## 2021-12-30 ENCOUNTER — Inpatient Hospital Stay: Payer: BC Managed Care – PPO | Admitting: Internal Medicine

## 2021-12-30 ENCOUNTER — Encounter: Payer: Self-pay | Admitting: Internal Medicine

## 2021-12-30 ENCOUNTER — Telehealth: Payer: Self-pay | Admitting: Internal Medicine

## 2021-12-30 VITALS — BP 160/85 | HR 107 | Temp 97.9°F | Resp 16 | Wt 163.1 lb

## 2021-12-30 DIAGNOSIS — Z79899 Other long term (current) drug therapy: Secondary | ICD-10-CM | POA: Insufficient documentation

## 2021-12-30 DIAGNOSIS — C711 Malignant neoplasm of frontal lobe: Secondary | ICD-10-CM | POA: Diagnosis not present

## 2021-12-30 DIAGNOSIS — G40909 Epilepsy, unspecified, not intractable, without status epilepticus: Secondary | ICD-10-CM

## 2021-12-30 DIAGNOSIS — C719 Malignant neoplasm of brain, unspecified: Secondary | ICD-10-CM

## 2021-12-30 DIAGNOSIS — C712 Malignant neoplasm of temporal lobe: Secondary | ICD-10-CM | POA: Diagnosis present

## 2021-12-30 LAB — CBC WITH DIFFERENTIAL (CANCER CENTER ONLY)
Abs Immature Granulocytes: 0 10*3/uL (ref 0.00–0.07)
Basophils Absolute: 0 10*3/uL (ref 0.0–0.1)
Basophils Relative: 1 %
Eosinophils Absolute: 0.1 10*3/uL (ref 0.0–0.5)
Eosinophils Relative: 3 %
HCT: 41.9 % (ref 36.0–46.0)
Hemoglobin: 14.3 g/dL (ref 12.0–15.0)
Immature Granulocytes: 0 %
Lymphocytes Relative: 31 %
Lymphs Abs: 1.3 10*3/uL (ref 0.7–4.0)
MCH: 30.4 pg (ref 26.0–34.0)
MCHC: 34.1 g/dL (ref 30.0–36.0)
MCV: 89.1 fL (ref 80.0–100.0)
Monocytes Absolute: 0.5 10*3/uL (ref 0.1–1.0)
Monocytes Relative: 12 %
Neutro Abs: 2.1 10*3/uL (ref 1.7–7.7)
Neutrophils Relative %: 53 %
Platelet Count: 222 10*3/uL (ref 150–400)
RBC: 4.7 MIL/uL (ref 3.87–5.11)
RDW: 13.6 % (ref 11.5–15.5)
WBC Count: 4.1 10*3/uL (ref 4.0–10.5)
nRBC: 0 % (ref 0.0–0.2)

## 2021-12-30 LAB — CMP (CANCER CENTER ONLY)
ALT: 22 U/L (ref 0–44)
AST: 50 U/L — ABNORMAL HIGH (ref 15–41)
Albumin: 4 g/dL (ref 3.5–5.0)
Alkaline Phosphatase: 70 U/L (ref 38–126)
Anion gap: 11 (ref 5–15)
BUN: <5 mg/dL — ABNORMAL LOW (ref 8–23)
CO2: 24 mmol/L (ref 22–32)
Calcium: 9.4 mg/dL (ref 8.9–10.3)
Chloride: 106 mmol/L (ref 98–111)
Creatinine: 0.47 mg/dL (ref 0.44–1.00)
GFR, Estimated: >60 mL/min (ref >60)
Glucose, Bld: 103 mg/dL — ABNORMAL HIGH (ref 70–99)
Potassium: 3.5 mmol/L (ref 3.5–5.1)
Sodium: 141 mmol/L (ref 135–145)
Total Bilirubin: 1.4 mg/dL — ABNORMAL HIGH (ref 0.3–1.2)
Total Protein: 6.6 g/dL (ref 6.5–8.1)

## 2021-12-30 LAB — TOTAL PROTEIN, URINE DIPSTICK: Protein, ur: <30 mg/dL — AB

## 2021-12-30 NOTE — Telephone Encounter (Signed)
Per 11/14 los called and spoke to pt about appointment, then we got disconnected so I called back back and left a message to make sure she knew the time of appointment

## 2021-12-30 NOTE — Progress Notes (Signed)
[place] at [place], [place] [address] [phone]   [place] [facility] Evaluation  Date of Service: 12/30/21 Patient Name: Lindsey Daniels Patient MRN: [id number] Patient DOB: 04/04/1955 Provider: Ventura Sellers, MD  Identifying Statement:  Lindsey Daniels is a 66 y.o. female with right temporal  pleomorphic xanthoastrocytoma    Oncologic History: Oncology History  Pleomorphic xanthoastrocytoma ([place])  09/29/2013 Surgery   Right temporal craniotomy, resection with Dr. Sherwood Gambler.  Path is PXA WHO II.   11/29/2020 Surgery   Disease progression, second craniotomy with Dr. Christella Noa, sub-total resection.  Path is unchanged.   02/21/2021 - 04/02/2021 Radiation Therapy   IMRT radiation therapy with Dr. Lisbeth Renshaw   12/16/2021 Treatment Plan Change   Completes 4 cycles of avastin 35m/kg for severe inflammation, poor tolerance of steroids     Biomarkers:  MGMT Methylated.  IDH 1/2 Wild type.  BRAF V600e mutation detected  TERT Unmutated   Interval History: MShyne Reschpresents for evaluation following recent MRI brain.  No new or progressive complaints.  Fatigue is stable overall, continues on the eliquis.  Still off the decadron.  No recurrence of focal symptoms or seizures.  H+P (01/30/21) Patient presents today after recent re-resection for progressive right temporal PXA.  She initially presented with a seizure in 2015; imaging demonstrated enhancing right temporal lesion which was grossly resected by Dr. NSherwood Gambler  After last follow up in 2020, she began to experience headaches, ear fullness and some confusion this fall.  Imaging demonstrated marked progression of disease; she went for second surgery on 11/29/20.  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   Medications: Current Outpatient Medications on File Prior to Visit  Medication Sig Dispense Refill    amLODipine (NORVASC) 10 MG tablet Take 1 tablet (10 mg total) by mouth daily.     apixaban (ELIQUIS) 5 MG TABS tablet Take 1 tablet (5 mg total) by mouth 2 (two) times daily. 60 tablet 2   atorvastatin (LIPITOR) 20 MG tablet Take 1 tablet (20 mg total) by mouth daily. 90 tablet 3   cetirizine (ZYRTEC) 10 MG tablet Take 10 mg by mouth daily.     cholecalciferol (VITAMIN D) 1000 units tablet Take 1,000 Units by mouth daily.     fluticasone (FLONASE) 50 MCG/ACT nasal spray Place 1-2 sprays into both nostrils daily. 16 g 0   levETIRAcetam (KEPPRA) 750 MG tablet Take 1 tablet (750 mg total) by mouth 2 (two) times daily. 120 tablet 1   losartan (COZAAR) 50 MG tablet Take 1 tablet (50 mg total) by mouth daily.     Multiple Vitamin (MULTIVITAMIN WITH MINERALS) TABS tablet Take 1 tablet by mouth at bedtime.     pantoprazole (PROTONIX) 40 MG tablet Take 1 tablet (40 mg total) by mouth daily.     sertraline (ZOLOFT) 100 MG tablet Take 1 tablet (100 mg total) by mouth daily. (Patient taking differently: Take 100 mg by mouth at bedtime.) 90 tablet 3   valACYclovir (VALTREX) 500 MG tablet Take 1 tablet (500 mg total) by mouth 2 (two) times daily. Takes only if needed for breakouts (Patient not taking: Reported on 12/02/2021) 30 tablet prn   No current facility-administered medications on file prior to visit.    Allergies:  Allergies  Allergen Reactions   Rifapentine Other (See Comments)    Flu-like symptoms   Amoxicillin Rash  Clavulanic Acid Rash   Past Medical History:  Past Medical History:  Diagnosis Date   Allergy    Anal fissure    Anxiety    Cataract    Depression    Elevated LFTs    Hyperglycemia    Hyperlipidemia    Hypertension    Osteoarthritis    Pars defect of lumbar spine    Seizures (HCC)    Spondylolisthesis    lumbar   Past Surgical History:  Past Surgical History:  Procedure Laterality Date   cataract     CRANIOTOMY N/A 10/02/2013   Procedure: Craniotomy for  resection of tumor with stealth;  Surgeon: Hosie Spangle, MD;  Location: [facility];  Service: Neurosurgery;  Laterality: N/A;  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   CRANIOTOMY Right 11/29/2020   Procedure: Right Parietal craniotomy for tumor resection;  Surgeon: Ashok Pall, MD;  Location: [facility];  Service: Neurosurgery;  Laterality: Right;   HEEL SPUR SURGERY     Social History:  Social History   Socioeconomic History   Marital status: Divorced    Spouse name: separated-no papers file   Number of children: 0   Years of education: Not on file   Highest education level: Not on file  Occupational History   Occupation: RT    Employer:  [employer] @ [place]: [employer] and [employer]  Tobacco Use   Smoking status: Never   Smokeless tobacco: Never  Substance and Sexual Activity   Alcohol use: Yes    Alcohol/week: 0.0 - 4.0 standard drinks of alcohol    Comment: social   Drug use: No   Sexual activity: Not Currently    Partners: Male  Other Topics Concern   Not on file  Social History Narrative   Separated from second husband.  Very contentious split.   Social Determinants of Health   Financial Resource Strain: Not on file  Food Insecurity: Not on file  Transportation Needs: Not on file  Physical Activity: Not on file  Stress: Not on file  Social Connections: Not on file  Intimate Partner Violence: Not At Risk (02/06/2021)   Humiliation, Afraid, Rape, and Kick questionnaire    Fear of Current or Ex-Partner: No    Emotionally Abused: No    Physically Abused: No    Sexually Abused: No   Family History:  Family History  Problem Relation Age of Onset   Mental illness Father        depression   COPD Father    Alcohol abuse Brother    Breast cancer Neg Hx     Review of Systems: Constitutional: Doesn't report fevers, chills or abnormal weight loss Eyes: Doesn't report blurriness of vision Ears, nose, mouth,  throat, and face: Doesn't report sore throat Respiratory: +dyspnea Cardiovascular: Doesn't report palpitation, chest discomfort  Gastrointestinal:  Doesn't report nausea, constipation, diarrhea GU: Doesn't report incontinence Skin: Doesn't report skin rashes Neurological: Per HPI Musculoskeletal: Doesn't report joint pain Behavioral/Psych: Doesn't report anxiety  Physical Exam: There were no vitals filed for this visit.  KPS: 90. General: cushingoid Head: Normal EENT: No conjunctival injection or scleral icterus.  Lungs: Normal Cardiac: Regular rate Abdomen: Non-distended abdomen Skin: +purpura, arms and legs Extremities: +edema  Neurologic Exam: Mental Status: Awake, alert, attentive to examiner. Oriented to self and environment. Language is fluent with intact comprehension.  Cranial Nerves: Visual acuity is grossly normal. Visual fields are full. Extra-ocular movements intact. No ptosis.  Face is symmetric Motor: Tone and bulk are normal. Power is full in both arms and legs. Reflexes are symmetric, no pathologic reflexes present.  Sensory: Intact to light touch Gait: Normal.   Labs: I have reviewed the data as listed    Component Value Date/Time   NA 141 12/16/2021 0954   NA 142 06/30/2017 1815   K 3.5 12/16/2021 0954   CL 105 12/16/2021 0954   CO2 24 12/16/2021 0954   GLUCOSE 109 (H) 12/16/2021 0954   BUN 7 (L) 12/16/2021 0954   BUN 14 06/30/2017 1815   CREATININE 0.50 12/16/2021 0954   CREATININE 0.72 06/07/2015 0825   CALCIUM 9.5 12/16/2021 0954   PROT 6.6 12/16/2021 0954   PROT 7.0 06/30/2017 1815   ALBUMIN 4.0 12/16/2021 0954   ALBUMIN 4.6 06/30/2017 1815   AST 34 12/16/2021 0954   ALT 18 12/16/2021 0954   ALKPHOS 70 12/16/2021 0954   BILITOT 1.2 12/16/2021 0954   GFRNONAA >60 12/16/2021 0954   GFRAA 108 06/30/2017 1815   Lab Results  Component Value Date   WBC 4.1 12/30/2021   NEUTROABS 2.1 12/30/2021   HGB 14.3 12/30/2021   HCT 41.9 12/30/2021    MCV 89.1 12/30/2021   PLT 222 12/30/2021   Imaging:  [facility] Clinician Interpretation: I have personally reviewed the CNS images as listed.  My interpretation, in the context of the patient's clinical presentation, is stable disease  MR BRAIN W WO CONTRAST  Result Date: 12/26/2021 CLINICAL DATA:  Cerebral edema ([place]) G93.6 (ICD-10-CM). EXAM: MRI HEAD WITHOUT AND WITH CONTRAST TECHNIQUE: Multiplanar, multiecho pulse sequences of the brain and surrounding structures were obtained without and with intravenous contrast. CONTRAST:  7.48m GADAVIST GADOBUTROL 1 MMOL/ML IV SOLN COMPARISON:  MRI of the brain October 11, 2021 FINDINGS: Brain: Postsurgical changes from right temporal craniotomy resection of right temporal lobe tumor. There is significant interval decrease in size of the heterogeneous, predominantly necrotic lesion in the right temporal lobe with decrease in extension and thickness of the peripheral irregular contrast enhancement. The lesion now measures approximately 3.5 x 3.0 x 2.2 cm (4.1 x 4.5 x 3.4 cm on prior). There is however increase in the area of restricted diffusion in the anterior margin of the lesion. Extensive T2 hyperintensity surrounding the lesion and extending through most of the right cerebral hemisphere has mildly decreased when compared to prior. No new focus of abnormal contrast enhancement identified. No hydrocephalus, hemorrhage or midline shift. Vascular: Normal flow voids. Skull and upper cervical spine: Postsurgical changes from prior right temporal craniotomy. No focal marrow lesion identified. Sinuses/Orbits: Negative. Other: None. IMPRESSION: 1. Significant interval decrease in size of the heterogeneous, predominantly necrotic lesion in the right temporal lobe with decrease in extension and thickness of the peripheral irregular contrast enhancement. There is however increase in the area of restricted diffusion in the anterior margin of the lesion of unknown significance but  could be related to hypercellularity versus ongoing ischemic changes. 2. Extensive T2 hyperintensity surrounding the lesion and extending throughout most of the right cerebral hemisphere has mildly decreased when compared to prior MRI. Electronically Signed   By: KPedro EarlsM.D.   On: 12/26/2021 16:48   Assessment/Plan Pleomorphic xanthoastrocytoma ([facility]  Seizure disorder ([place]  MKristene Liberatiis clinically stable today.  MRI brain demonstrates good response to avastin, with decreased burden of enhancement, T2 signal.  There is an increase in restricted diffusion which could represent radionecrosis or avastin effect.  Tumor recurrence is  less likely, though possible.  We will transition to imaging surveillance.  Decadron should remain off if tolerated.  Keppra should con't 752m BID.  We ask that MKarle StarchSiler return to clinic in 2 months following next brain MRI, or sooner as needed.  All questions were answered. The patient knows to call the clinic with any problems, questions or concerns. No barriers to learning were detected.  The total time spent in the encounter was 30 minutes and more than 50% was on counseling and review of test results   ZVentura Sellers MD Medical Director of Neuro-Oncology [facility] LLCat WNorthchase11/14/23 9:46 AM

## 2021-12-31 ENCOUNTER — Other Ambulatory Visit: Payer: Self-pay

## 2022-01-01 ENCOUNTER — Other Ambulatory Visit: Payer: Self-pay

## 2022-01-12 ENCOUNTER — Telehealth: Payer: Self-pay | Admitting: *Deleted

## 2022-01-12 NOTE — Telephone Encounter (Signed)
Patient called to report that she has progressively had an issue since last week of standing/ walking all day of pain across of her lower back that radiates across to the other side and it radiates down her leg to her toes.  She states she has had to crawl around the house because of the pain.    States she has Mobic '15mg'$  that she had left over and she has tried two separate doses and seen no improvement.  Routing to Dr Mickeal Skinner to see if this should be handled by PCP or would he see her regarding this.

## 2022-01-12 NOTE — Telephone Encounter (Signed)
Called patient back, no answer, left message advising patient to go to Urgent care or PCP to have evaluated.

## 2022-01-13 ENCOUNTER — Other Ambulatory Visit: Payer: Self-pay

## 2022-01-13 ENCOUNTER — Ambulatory Visit: Payer: BC Managed Care – PPO

## 2022-01-13 ENCOUNTER — Other Ambulatory Visit: Payer: BC Managed Care – PPO

## 2022-01-13 ENCOUNTER — Emergency Department (HOSPITAL_COMMUNITY): Payer: BC Managed Care – PPO

## 2022-01-13 ENCOUNTER — Emergency Department (HOSPITAL_COMMUNITY)
Admission: EM | Admit: 2022-01-13 | Discharge: 2022-01-14 | Disposition: A | Discharge status: Home / Self Care | Payer: BC Managed Care – PPO | Attending: Emergency Medicine | Admitting: Emergency Medicine

## 2022-01-13 ENCOUNTER — Ambulatory Visit: Payer: BC Managed Care – PPO | Admitting: Internal Medicine

## 2022-01-13 ENCOUNTER — Encounter: Payer: Self-pay | Admitting: Internal Medicine

## 2022-01-13 DIAGNOSIS — R251 Tremor, unspecified: Secondary | ICD-10-CM | POA: Diagnosis not present

## 2022-01-13 DIAGNOSIS — R569 Unspecified convulsions: Secondary | ICD-10-CM | POA: Insufficient documentation

## 2022-01-13 DIAGNOSIS — Z7901 Long term (current) use of anticoagulants: Secondary | ICD-10-CM | POA: Insufficient documentation

## 2022-01-13 LAB — ETHANOL: Alcohol, Ethyl (B): <10 mg/dL (ref <10)

## 2022-01-13 LAB — CBC WITH DIFFERENTIAL/PLATELET
Abs Immature Granulocytes: 0.03 10*3/uL (ref 0.00–0.07)
Basophils Absolute: 0 10*3/uL (ref 0.0–0.1)
Basophils Relative: 1 %
Eosinophils Absolute: 0.1 10*3/uL (ref 0.0–0.5)
Eosinophils Relative: 2 %
HCT: 47.7 % — ABNORMAL HIGH (ref 36.0–46.0)
Hemoglobin: 15.6 g/dL — ABNORMAL HIGH (ref 12.0–15.0)
Immature Granulocytes: 0 %
Lymphocytes Relative: 16 %
Lymphs Abs: 1.2 10*3/uL (ref 0.7–4.0)
MCH: 29.4 pg (ref 26.0–34.0)
MCHC: 32.7 g/dL (ref 30.0–36.0)
MCV: 90 fL (ref 80.0–100.0)
Monocytes Absolute: 0.6 10*3/uL (ref 0.1–1.0)
Monocytes Relative: 9 %
Neutro Abs: 5.3 10*3/uL (ref 1.7–7.7)
Neutrophils Relative %: 72 %
Platelets: 277 10*3/uL (ref 150–400)
RBC: 5.3 MIL/uL — ABNORMAL HIGH (ref 3.87–5.11)
RDW: 13.2 % (ref 11.5–15.5)
WBC: 7.3 10*3/uL (ref 4.0–10.5)
nRBC: 0 % (ref 0.0–0.2)

## 2022-01-13 LAB — COMPREHENSIVE METABOLIC PANEL
ALT: 24 U/L (ref 0–44)
AST: 41 U/L (ref 15–41)
Albumin: 3.9 g/dL (ref 3.5–5.0)
Alkaline Phosphatase: 59 U/L (ref 38–126)
Anion gap: 18 — ABNORMAL HIGH (ref 5–15)
BUN: 6 mg/dL — ABNORMAL LOW (ref 8–23)
CO2: 17 mmol/L — ABNORMAL LOW (ref 22–32)
Calcium: 9.5 mg/dL (ref 8.9–10.3)
Chloride: 104 mmol/L (ref 98–111)
Creatinine, Ser: 0.68 mg/dL (ref 0.44–1.00)
GFR, Estimated: >60 mL/min (ref >60)
Glucose, Bld: 142 mg/dL — ABNORMAL HIGH (ref 70–99)
Potassium: 3.3 mmol/L — ABNORMAL LOW (ref 3.5–5.1)
Sodium: 139 mmol/L (ref 135–145)
Total Bilirubin: 1.9 mg/dL — ABNORMAL HIGH (ref 0.3–1.2)
Total Protein: 6.9 g/dL (ref 6.5–8.1)

## 2022-01-13 LAB — CBG MONITORING, ED: Glucose-Capillary: 153 mg/dL — ABNORMAL HIGH (ref 70–99)

## 2022-01-13 MED ORDER — LEVETIRACETAM IN NACL 1000 MG/100ML IV SOLN
1000.0000 mg | Freq: Two times a day (BID) | INTRAVENOUS | Status: DC
  Administered 2022-01-13: 1000 mg via INTRAVENOUS
  Filled 2022-01-13 (×2): qty 100

## 2022-01-13 MED ORDER — SODIUM CHLORIDE 0.9 % IV BOLUS
500.0000 mL | Freq: Once | INTRAVENOUS | Status: AC
  Administered 2022-01-13: 500 mL via INTRAVENOUS

## 2022-01-13 MED ORDER — SODIUM CHLORIDE 0.9 % IV BOLUS
1000.0000 mL | Freq: Once | INTRAVENOUS | Status: AC
  Administered 2022-01-13: 1000 mL via INTRAVENOUS

## 2022-01-13 MED ORDER — LEVETIRACETAM 750 MG PO TABS: 1500.0000 mg | ORAL_TABLET | Freq: Two times a day (BID) | ORAL | 0 refills | Status: DC

## 2022-01-13 NOTE — ED Notes (Signed)
Pt had full gown and linen changed due to incontinence

## 2022-01-13 NOTE — ED Triage Notes (Signed)
Pt to ED via EMS from home. EMS states that her friend found her at home on her sofa unconscious and then pt started having  a seizure. Pt was actively seizing when EMS arrived. States pt was also guppy breathing. EMS assisted with ventilations. EMS gave '5mg'$  Midazolam IM. Pt has a hx of a brain tumor and seizures

## 2022-01-13 NOTE — ED Notes (Signed)
Pt changed, AxO x 3, disoriented to situation, pt continues to take off monitor and clothes

## 2022-01-13 NOTE — Discharge Instructions (Addendum)
Please be sure to follow-up with your oncologist in the clinic.  You have been prescribed a new dose of Keppra.  You may use your existing tablets in combination to take 1500 mg twice daily.  Return here for concerning changes in your condition.

## 2022-01-13 NOTE — ED Provider Notes (Signed)
4:32 PM Assumed care of patient from off-going team. For more details, please see note from same day.  In brief, this is a 66 y.o. female who presented w/ EMS d/t seizure activity. S/p 5 mg valium en route. She has known brain tumor on keppra. Dr. Vanita Panda reviewed case w/ neuro-oncologist who will increase her meds at home to 1500 mg BID.   Plan/Dispo at time of sign-out & ED Course since sign-out: '[ ]'$  wake up reassess  BP 128/61   Pulse 100   Temp 97.8 F (36.6 C) (Axillary)   Resp 20   Ht '5\' 3"'$  (1.6 m)   Wt 74 kg   SpO2 100%   BMI 28.89 kg/m    ED Course:   Clinical Course as of 01/14/22 0046  Tue Jan 13, 2022  1948 Patient reevaluated. She is still very sleepy, but is oriented to person, place, and time. Do not believe she is ready just yet for discharge as she is still very sleepy. Will reevaluate in another hour. [HN]  2321 Patient still very sleepy. She is oriented x 3 but do not believe she is ready for discharge yet. [HN]  Wed Jan 14, 2022  0045 Patient is signed out to the oncoming ED physician who is made aware of her history, presentation, exam, workup, and plan.  Plan is for patient to wake up and reassess. Consider possible prolonged post ictal phase. [HN]    Clinical Course User Index [HN] Audley Hose, MD    ------------------------------- Cindee Lame, MD Emergency Medicine  This note was created using dictation software, which may contain spelling or grammatical errors.   Audley Hose, MD 01/14/22 [phone]

## 2022-01-13 NOTE — ED Provider Notes (Signed)
[facility] DEPT Provider Note   CSN: [id number] Arrival date & time: 01/13/22  1344     History  Chief Complaint  Patient presents with   Seizures    Lindsey Daniels is a 66 y.o. female.  HPI Patient presents via EMS due to seizure activity.  History is obtained by EMS individuals as the patient is postictal.  They note the patient was found by family members experiencing seizure-like activity on a couch.  No reported trauma.  No family report of recent change in health status.  EMS reports the patient was shaking, required nasal airway after initial attempted intubation was unsuccessful.  Subsequently the patient was noted to be DNR.  Patient received Valium, '5mg'$  en route.  Level 5 Caveat 2/2 acuity of condition.    Home Medications Prior to Admission medications   Medication Sig Start Date End Date Taking? Authorizing Provider  levETIRAcetam (KEPPRA) 750 MG tablet Take 2 tablets (1,500 mg total) by mouth 2 (two) times daily. 01/13/22  Yes Carmin Muskrat, MD  amLODipine (NORVASC) 10 MG tablet Take 1 tablet (10 mg total) by mouth daily. 10/06/21   Oswald Hillock, MD  apixaban (ELIQUIS) 5 MG TABS tablet Take 1 tablet (5 mg total) by mouth 2 (two) times daily. 11/18/21   Ventura Sellers, MD  atorvastatin (LIPITOR) 20 MG tablet Take 1 tablet (20 mg total) by mouth daily. 07/25/17   Harrison Mons, PA  cetirizine (ZYRTEC) 10 MG tablet Take 10 mg by mouth daily.    [provider]  cholecalciferol (VITAMIN D) 1000 units tablet Take 1,000 Units by mouth daily.    [provider]  fluticasone (FLONASE) 50 MCG/ACT nasal spray Place 1-2 sprays into both nostrils daily. 11/06/20   Wieters, Hallie C, PA-C  losartan (COZAAR) 50 MG tablet Take 1 tablet (50 mg total) by mouth daily. 10/06/21   Oswald Hillock, MD  Multiple Vitamin (MULTIVITAMIN WITH MINERALS) TABS tablet Take 1 tablet by mouth at bedtime.    [provider]  pantoprazole  (PROTONIX) 40 MG tablet Take 1 tablet (40 mg total) by mouth daily. 10/06/21   Oswald Hillock, MD  sertraline (ZOLOFT) 100 MG tablet Take 1 tablet (100 mg total) by mouth daily. Patient taking differently: Take 100 mg by mouth at bedtime. 06/30/17   Harrison Mons, PA  valACYclovir (VALTREX) 500 MG tablet Take 1 tablet (500 mg total) by mouth 2 (two) times daily. Takes only if needed for breakouts Patient not taking: Reported on 12/02/2021 06/30/17   Harrison Mons, PA      Allergies    Rifapentine, Amoxicillin, and Clavulanic acid    Review of Systems   Review of Systems  Unable to perform ROS: Acuity of condition    Physical Exam Updated Vital Signs BP 128/61   Pulse 100   Temp 97.8 F (36.6 C) (Axillary)   Resp 20   Ht '5\' 3"'$  (1.6 m)   Wt 74 kg   SpO2 100%   BMI 28.89 kg/m  Physical Exam Vitals and nursing note reviewed.  Constitutional:      General: She is not in acute distress.    Appearance: She is well-developed.  HENT:     Head: Normocephalic and atraumatic.  Eyes:     Conjunctiva/sclera: Conjunctivae normal.  Cardiovascular:     Rate and Rhythm: Normal rate and regular rhythm.  Pulmonary:     Effort: Pulmonary effort is normal. No respiratory distress.  Breath sounds: Normal breath sounds. No stridor.  Abdominal:     General: There is no distension.  Skin:    General: Skin is warm and dry.  Neurological:     Mental Status: She is alert and oriented to person, place, and time.     Cranial Nerves: No cranial nerve deficit.  Psychiatric:        Mood and Affect: Mood normal.     ED Results / Procedures / Treatments   Labs (all labs ordered are listed, but only abnormal results are displayed) Labs Reviewed  COMPREHENSIVE METABOLIC PANEL - Abnormal; Notable for the following components:      Result Value   Potassium 3.3 (*)    CO2 17 (*)    Glucose, Bld 142 (*)    BUN 6 (*)    Total Bilirubin 1.9 (*)    Anion gap 18 (*)    All other components  within normal limits  CBC WITH DIFFERENTIAL/PLATELET - Abnormal; Notable for the following components:   RBC 5.30 (*)    Hemoglobin 15.6 (*)    HCT 47.7 (*)    All other components within normal limits  CBG MONITORING, ED - Abnormal; Notable for the following components:   Glucose-Capillary 153 (*)    All other components within normal limits  ETHANOL    EKG None  Radiology CT Head Wo Contrast  Result Date: 01/13/2022 CLINICAL DATA:  Mental status change seizure EXAM: CT HEAD WITHOUT CONTRAST TECHNIQUE: Contiguous axial images were obtained from the base of the skull through the vertex without intravenous contrast. RADIATION DOSE REDUCTION: This exam was performed according to the departmental dose-optimization program which includes automated exposure control, adjustment of the mA and/or kV according to patient size and/or use of iterative reconstruction technique. COMPARISON:  MRI 12/24/2021, CT 09/28/2021, 07/15/2021 FINDINGS: Brain: Negative for hemorrhage or midline shift. Right hemispheric edema, grossly similar extent as compared with MRI from 12/24/2021, improved compared to prior CT head examinations. Cystic right temporal lobe lesion is better seen on MRI. The ventricles are stable in size. Vascular: No hyperdense vessels.  No unexpected calcification Skull: Similar right craniotomy changes.  No fracture. Sinuses/Orbits: No acute finding. Other: None IMPRESSION: 1. Negative for acute intracranial hemorrhage or midline shift. 2. Fairly extensive right hemispheric edema, appears grossly stable as compared with MRI from December 24, 2021, improved compared with CT brain examinations from August. No new areas of low-density edema are seen on CT. Electronically Signed   By: Donavan Foil M.D.   On: 01/13/2022 15:54    Procedures Procedures    Medications Ordered in ED Medications  levETIRAcetam (KEPPRA) IVPB 1000 mg/100 mL premix (0 mg Intravenous Stopped 01/13/22 1450)  sodium  chloride 0.9 % bolus 1,000 mL (0 mLs Intravenous Stopped 01/13/22 1608)    ED Course/ Medical Decision Making/ A&P This patient with a Hx of pleomorphic xanthoastrocytoma/brain tumor, hyperlipidemia, seizures presents to the ED for concern of seizure, this involves an extensive number of treatment options, and is a complaint that carries with it a high risk of complications and morbidity.    The differential diagnosis includes progression of disease, edema, hemorrhage, medication noncompliance   Social Determinants of Health:  Brain tumor, advanced age  Additional history obtained:  Additional history and/or information obtained from chart review and EMS, notable for EMS details included above.  Chart review notable for ongoing care from neuroradiology for concern of prior resection of tumor, poor tolerance of steroids, now with ongoing management  of pain, weakness.   After the initial evaluation, orders, including: Head CT, IV Keppra, continuous monitoring were initiated.   Patient placed on Cardiac and Pulse-Oximetry Monitors. The patient was maintained on a cardiac monitor.  The cardiac monitored showed an rhythm of 110 sinus tach abnormal The patient was also maintained on pulse oximetry. The readings were typically 99% room air normal   On repeat evaluation of the patient improved Patient awakened somewhat, but remained in postictal phase for prolonged time. Lab Tests:  I personally interpreted labs.  The pertinent results include: Essentially unremarkable  Imaging Studies ordered:  I independently visualized and interpreted imaging which showed head CT without appreciable change from most recent I agree with the radiologist interpretation  Consultations Obtained:  I requested consultation with Dr. Mickeal Skinner,  and discussed lab and imaging findings as well as pertinent plan - they recommend: Increase in [place] to 1500 twice daily, and if the patient is returning to baseline  she can follow-up as an outpatient  Dispostion / Final MDM:  After consideration of the diagnostic results and the patient's response to treatment, this adult female is slowly awakening, is now smiling, offering verbal responses.  Heart rate now less than 100.  I have reviewed her case with her neuro oncologist, there is suspicion for breakthrough seizure, the patient will have increase in her seizure medication regimen.  Given her prolonged postictal phase, presented with seizures, no history of brain tumor, hospitalization was a consideration, but after benzodiazepines, fluids, Keppra loading IV, patient has improved substantially, if she continues to do so she may be discharged to follow-up in the clinic.  Final Clinical Impression(s) / ED Diagnoses Final diagnoses:  Seizure [facility])    Rx / DC Orders ED Discharge Orders          Ordered    levETIRAcetam (KEPPRA) 750 MG tablet  2 times daily        01/13/22 1625              Carmin Muskrat, MD 01/13/22 1625

## 2022-01-13 NOTE — ED Notes (Signed)
Pt CBG 153 

## 2022-01-14 ENCOUNTER — Telehealth: Payer: Self-pay | Admitting: *Deleted

## 2022-01-14 NOTE — Telephone Encounter (Signed)
Received vm call from Lindsey Daniels/friend stating that pt has an episode yesterday & found non-responsive at home & was sent to ED/[facility].  Stroke was ruled out & she was d/c home.  She reports brother, Lindsey Daniels is there with her & he is concerned that she needs a lot of assistance & she is sleepy & can't move on her own.  Called & spoke to [place]. He wants to know if someone can stay with her or get in rehab b/c he is going to have to go back to work after this week.  He thinks she is a little confused & waving L arm around.  He reports she is oriented to person, place, & time & responds to commands. Informed that message would be sent to Dr Mickeal Skinner & will also route message to CSW to see if they have some suggestions for him & encouraged to call PCP to discuss.

## 2022-01-14 NOTE — ED Provider Notes (Addendum)
Patient signed out to me from previous provider.  Patient with prolonged postictal state after a seizure.  Upon reevaluation she is awake and alert, oriented.  Patient without complaints.  She tells me she does have her prescribed home medications including Keppra.  She will have a dose change.  Will discharge.   Orpah Greek, MD 01/14/22 0153    Orpah Greek, MD 01/14/22 [phone]

## 2022-01-15 ENCOUNTER — Inpatient Hospital Stay: Payer: BC Managed Care – PPO

## 2022-01-15 ENCOUNTER — Telehealth: Payer: Self-pay | Admitting: *Deleted

## 2022-01-15 ENCOUNTER — Telehealth: Payer: Self-pay

## 2022-01-15 ENCOUNTER — Other Ambulatory Visit: Payer: Self-pay | Admitting: Internal Medicine

## 2022-01-15 MED ORDER — DEXAMETHASONE 4 MG PO TABS: 4.0000 mg | ORAL_TABLET | Freq: Two times a day (BID) | ORAL | 0 refills | Status: DC

## 2022-01-15 NOTE — Telephone Encounter (Signed)
Called patients brother Gwynneth Macleod to check up on patient.  He states that she still hasn't regained any strength to her left hand and arm, unable to use a walker because of that and still requires standby assistance [place] to bathroom.  He also states that she has some odd behavior with reaching for things that aren't there.  Speech is thick and harder to understand/slurred mildly.    Per Dr Mickeal Skinner instructed to start Decadron 8 mg today and then do Decadron 4 mg BID.  If no improvement after starting Decadron then she should go to Emergency Room to have EEG to rule out seizure activity.  Dr Mickeal Skinner will reevaluate next week to find out if Avastin needs to be reinstated.

## 2022-01-15 NOTE — Telephone Encounter (Signed)
CSW attempted to contact patient's brother, Gwynneth Macleod, per the request of RN.  Left vm.

## 2022-01-15 NOTE — Progress Notes (Signed)
[facility] [facility]  Clinical Social Work was referred by nurse for assessment of psychosocial needs.  Clinical Social Worker contacted caregiver by phone  to offer support and assess for needs.    Patient's brother, Gwynneth Macleod, returned CSW call.  He said patient requires increased care.  She lives alone and he has to return to work next week.  Provided education regarding options.  He said he has to assist her in ambulation and that she randomly moves her left arm.  CSW securely emailed [facility] resources for home care.  He does not feel patient would be agreeable to going to a facility at this time.  Encouraged Wes to contact CSW with any additional questions.       Margaree Mackintosh, LCSW  Clinical Social Worker [facility]

## 2022-01-15 NOTE — Telephone Encounter (Signed)
I spoke with Wes, patient's brother.  He is probably going to hire a caregiver.  Also provided education on facility placement if needed.

## 2022-01-17 ENCOUNTER — Other Ambulatory Visit: Payer: Self-pay

## 2022-01-17 ENCOUNTER — Observation Stay (HOSPITAL_COMMUNITY): Payer: BC Managed Care – PPO

## 2022-01-17 ENCOUNTER — Other Ambulatory Visit (HOSPITAL_COMMUNITY): Payer: Self-pay

## 2022-01-17 ENCOUNTER — Inpatient Hospital Stay (HOSPITAL_COMMUNITY)
Admission: EM | Admit: 2022-01-17 | Discharge: 2022-01-29 | DRG: 100 | Disposition: A | Discharge status: Skilled Nursing Facility | Payer: BC Managed Care – PPO | Attending: Internal Medicine | Admitting: Internal Medicine

## 2022-01-17 DIAGNOSIS — R1110 Vomiting, unspecified: Secondary | ICD-10-CM | POA: Diagnosis not present

## 2022-01-17 DIAGNOSIS — J302 Other seasonal allergic rhinitis: Secondary | ICD-10-CM | POA: Diagnosis not present

## 2022-01-17 DIAGNOSIS — I2699 Other pulmonary embolism without acute cor pulmonale: Secondary | ICD-10-CM | POA: Diagnosis present

## 2022-01-17 DIAGNOSIS — R569 Unspecified convulsions: Secondary | ICD-10-CM | POA: Diagnosis not present

## 2022-01-17 DIAGNOSIS — R1310 Dysphagia, unspecified: Secondary | ICD-10-CM | POA: Diagnosis not present

## 2022-01-17 DIAGNOSIS — M479 Spondylosis, unspecified: Secondary | ICD-10-CM | POA: Diagnosis present

## 2022-01-17 DIAGNOSIS — F419 Anxiety disorder, unspecified: Secondary | ICD-10-CM | POA: Diagnosis present

## 2022-01-17 DIAGNOSIS — E162 Hypoglycemia, unspecified: Secondary | ICD-10-CM | POA: Diagnosis not present

## 2022-01-17 DIAGNOSIS — G40901 Epilepsy, unspecified, not intractable, with status epilepticus: Secondary | ICD-10-CM | POA: Diagnosis present

## 2022-01-17 DIAGNOSIS — E669 Obesity, unspecified: Secondary | ICD-10-CM | POA: Diagnosis present

## 2022-01-17 DIAGNOSIS — E785 Hyperlipidemia, unspecified: Secondary | ICD-10-CM | POA: Diagnosis present

## 2022-01-17 DIAGNOSIS — G8194 Hemiplegia, unspecified affecting left nondominant side: Secondary | ICD-10-CM | POA: Diagnosis present

## 2022-01-17 DIAGNOSIS — Z85841 Personal history of malignant neoplasm of brain: Secondary | ICD-10-CM

## 2022-01-17 DIAGNOSIS — R29810 Facial weakness: Secondary | ICD-10-CM | POA: Diagnosis present

## 2022-01-17 DIAGNOSIS — J9601 Acute respiratory failure with hypoxia: Secondary | ICD-10-CM | POA: Diagnosis not present

## 2022-01-17 DIAGNOSIS — Z888 Allergy status to other drugs, medicaments and biological substances status: Secondary | ICD-10-CM

## 2022-01-17 DIAGNOSIS — R34 Anuria and oliguria: Secondary | ICD-10-CM | POA: Diagnosis not present

## 2022-01-17 DIAGNOSIS — E876 Hypokalemia: Secondary | ICD-10-CM | POA: Diagnosis not present

## 2022-01-17 DIAGNOSIS — G40101 Localization-related (focal) (partial) symptomatic epilepsy and epileptic syndromes with simple partial seizures, not intractable, with status epilepticus: Principal | ICD-10-CM | POA: Diagnosis present

## 2022-01-17 DIAGNOSIS — Z881 Allergy status to other antibiotic agents status: Secondary | ICD-10-CM

## 2022-01-17 DIAGNOSIS — I10 Essential (primary) hypertension: Secondary | ICD-10-CM | POA: Diagnosis present

## 2022-01-17 DIAGNOSIS — R739 Hyperglycemia, unspecified: Secondary | ICD-10-CM | POA: Diagnosis not present

## 2022-01-17 DIAGNOSIS — Z66 Do not resuscitate: Secondary | ICD-10-CM | POA: Diagnosis not present

## 2022-01-17 DIAGNOSIS — K219 Gastro-esophageal reflux disease without esophagitis: Secondary | ICD-10-CM | POA: Diagnosis not present

## 2022-01-17 DIAGNOSIS — B009 Herpesviral infection, unspecified: Secondary | ICD-10-CM

## 2022-01-17 DIAGNOSIS — R471 Dysarthria and anarthria: Secondary | ICD-10-CM | POA: Diagnosis not present

## 2022-01-17 DIAGNOSIS — D496 Neoplasm of unspecified behavior of brain: Secondary | ICD-10-CM | POA: Diagnosis not present

## 2022-01-17 DIAGNOSIS — Z79899 Other long term (current) drug therapy: Secondary | ICD-10-CM

## 2022-01-17 DIAGNOSIS — Z818 Family history of other mental and behavioral disorders: Secondary | ICD-10-CM

## 2022-01-17 DIAGNOSIS — Z9221 Personal history of antineoplastic chemotherapy: Secondary | ICD-10-CM

## 2022-01-17 DIAGNOSIS — G928 Other toxic encephalopathy: Secondary | ICD-10-CM | POA: Diagnosis present

## 2022-01-17 DIAGNOSIS — D72829 Elevated white blood cell count, unspecified: Secondary | ICD-10-CM | POA: Diagnosis not present

## 2022-01-17 DIAGNOSIS — T380X5A Adverse effect of glucocorticoids and synthetic analogues, initial encounter: Secondary | ICD-10-CM | POA: Diagnosis not present

## 2022-01-17 DIAGNOSIS — Z6830 Body mass index (BMI) 30.0-30.9, adult: Secondary | ICD-10-CM

## 2022-01-17 DIAGNOSIS — F32A Depression, unspecified: Secondary | ICD-10-CM | POA: Diagnosis not present

## 2022-01-17 DIAGNOSIS — J9600 Acute respiratory failure, unspecified whether with hypoxia or hypercapnia: Secondary | ICD-10-CM

## 2022-01-17 DIAGNOSIS — I447 Left bundle-branch block, unspecified: Secondary | ICD-10-CM | POA: Diagnosis present

## 2022-01-17 DIAGNOSIS — Z86711 Personal history of pulmonary embolism: Secondary | ICD-10-CM

## 2022-01-17 DIAGNOSIS — Z7901 Long term (current) use of anticoagulants: Secondary | ICD-10-CM

## 2022-01-17 DIAGNOSIS — Z923 Personal history of irradiation: Secondary | ICD-10-CM

## 2022-01-17 LAB — CBC WITH DIFFERENTIAL/PLATELET
Abs Immature Granulocytes: 0.04 10*3/uL (ref 0.00–0.07)
Basophils Absolute: 0 10*3/uL (ref 0.0–0.1)
Basophils Relative: 0 %
Eosinophils Absolute: 0.1 10*3/uL (ref 0.0–0.5)
Eosinophils Relative: 1 %
HCT: 44.5 % (ref 36.0–46.0)
Hemoglobin: 14.1 g/dL (ref 12.0–15.0)
Immature Granulocytes: 0 %
Lymphocytes Relative: 17 %
Lymphs Abs: 1.5 10*3/uL (ref 0.7–4.0)
MCH: 29.3 pg (ref 26.0–34.0)
MCHC: 31.7 g/dL (ref 30.0–36.0)
MCV: 92.3 fL (ref 80.0–100.0)
Monocytes Absolute: 0.7 10*3/uL (ref 0.1–1.0)
Monocytes Relative: 8 %
Neutro Abs: 6.6 10*3/uL (ref 1.7–7.7)
Neutrophils Relative %: 74 %
Platelets: 245 10*3/uL (ref 150–400)
RBC: 4.82 MIL/uL (ref 3.87–5.11)
RDW: 13.4 % (ref 11.5–15.5)
WBC: 9.1 10*3/uL (ref 4.0–10.5)
nRBC: 0 % (ref 0.0–0.2)

## 2022-01-17 LAB — LACTIC ACID, PLASMA
Lactic Acid, Venous: 2.3 mmol/L (ref 0.5–1.9)
Lactic Acid, Venous: 1.5 mmol/L (ref 0.5–1.9)

## 2022-01-17 LAB — CBG MONITORING, ED
Glucose-Capillary: 141 mg/dL — ABNORMAL HIGH (ref 70–99)
Glucose-Capillary: 157 mg/dL — ABNORMAL HIGH (ref 70–99)
Glucose-Capillary: 196 mg/dL — ABNORMAL HIGH (ref 70–99)
Glucose-Capillary: 43 mg/dL — CL (ref 70–99)
Glucose-Capillary: 89 mg/dL (ref 70–99)
Glucose-Capillary: 95 mg/dL (ref 70–99)
Glucose-Capillary: 97 mg/dL (ref 70–99)

## 2022-01-17 LAB — COMPREHENSIVE METABOLIC PANEL
ALT: 20 U/L (ref 0–44)
AST: 32 U/L (ref 15–41)
Albumin: 3.7 g/dL (ref 3.5–5.0)
Alkaline Phosphatase: 52 U/L (ref 38–126)
Anion gap: 11 (ref 5–15)
BUN: 13 mg/dL (ref 8–23)
CO2: 27 mmol/L (ref 22–32)
Calcium: 9.4 mg/dL (ref 8.9–10.3)
Chloride: 104 mmol/L (ref 98–111)
Creatinine, Ser: 0.49 mg/dL (ref 0.44–1.00)
GFR, Estimated: >60 mL/min (ref >60)
Glucose, Bld: 106 mg/dL — ABNORMAL HIGH (ref 70–99)
Potassium: 2.3 mmol/L — CL (ref 3.5–5.1)
Sodium: 142 mmol/L (ref 135–145)
Total Bilirubin: 0.8 mg/dL (ref 0.3–1.2)
Total Protein: 6.9 g/dL (ref 6.5–8.1)

## 2022-01-17 LAB — CK: Total CK: 311 U/L — ABNORMAL HIGH (ref 38–234)

## 2022-01-17 LAB — MAGNESIUM: Magnesium: 1.6 mg/dL — ABNORMAL LOW (ref 1.7–2.4)

## 2022-01-17 MED ORDER — POTASSIUM CHLORIDE IN NACL 40-0.9 MEQ/L-% IV SOLN
INTRAVENOUS | Status: DC
  Filled 2022-01-17: qty 1000

## 2022-01-17 MED ORDER — ONDANSETRON HCL 4 MG/2ML IJ SOLN
4.0000 mg | Freq: Four times a day (QID) | INTRAMUSCULAR | Status: DC | PRN
  Administered 2022-01-26: 4 mg via INTRAVENOUS
  Filled 2022-01-17: qty 2

## 2022-01-17 MED ORDER — LORAZEPAM 2 MG/ML IJ SOLN
2.0000 mg | Freq: Once | INTRAMUSCULAR | Status: AC
  Administered 2022-01-17: 2 mg via INTRAVENOUS
  Filled 2022-01-17: qty 1

## 2022-01-17 MED ORDER — LEVETIRACETAM IN NACL 1000 MG/100ML IV SOLN
1000.0000 mg | Freq: Once | INTRAVENOUS | Status: AC
  Administered 2022-01-17: 1000 mg via INTRAVENOUS
  Filled 2022-01-17: qty 100

## 2022-01-17 MED ORDER — SODIUM CHLORIDE 0.9 % IV SOLN
50.0000 mg | Freq: Two times a day (BID) | INTRAVENOUS | Status: DC
  Administered 2022-01-17 – 2022-01-18 (×2): 50 mg via INTRAVENOUS
  Filled 2022-01-17 (×3): qty 5

## 2022-01-17 MED ORDER — SODIUM CHLORIDE 0.9 % IV SOLN: 100.0000 mg | Freq: Two times a day (BID) | INTRAVENOUS | Status: DC

## 2022-01-17 MED ORDER — MAGNESIUM SULFATE 2 GM/50ML IV SOLN
2.0000 g | Freq: Once | INTRAVENOUS | Status: AC
  Administered 2022-01-17: 2 g via INTRAVENOUS
  Filled 2022-01-17: qty 50

## 2022-01-17 MED ORDER — GADOBUTROL 1 MMOL/ML IV SOLN
7.5000 mL | Freq: Once | INTRAVENOUS | Status: AC | PRN
  Administered 2022-01-17: 7.5 mL via INTRAVENOUS

## 2022-01-17 MED ORDER — DEXTROSE 50 % IV SOLN
1.0000 | Freq: Once | INTRAVENOUS | Status: AC
  Administered 2022-01-17: 50 mL via INTRAVENOUS
  Filled 2022-01-17: qty 50

## 2022-01-17 MED ORDER — LEVETIRACETAM IN NACL 1000 MG/100ML IV SOLN
1000.0000 mg | Freq: Two times a day (BID) | INTRAVENOUS | Status: DC
  Administered 2022-01-18: 1000 mg via INTRAVENOUS
  Filled 2022-01-17: qty 100

## 2022-01-17 MED ORDER — ACETAMINOPHEN 325 MG PO TABS
650.0000 mg | ORAL_TABLET | Freq: Four times a day (QID) | ORAL | Status: DC | PRN
  Administered 2022-01-24: 650 mg via ORAL
  Filled 2022-01-17: qty 2

## 2022-01-17 MED ORDER — POTASSIUM CHLORIDE 10 MEQ/100ML IV SOLN
10.0000 meq | INTRAVENOUS | Status: AC
  Administered 2022-01-17 (×4): 10 meq via INTRAVENOUS
  Filled 2022-01-17 (×4): qty 100

## 2022-01-17 MED ORDER — KCL IN DEXTROSE-NACL 40-5-0.9 MEQ/L-%-% IV SOLN
INTRAVENOUS | Status: AC
  Filled 2022-01-17 (×2): qty 1000

## 2022-01-17 MED ORDER — DEXAMETHASONE SODIUM PHOSPHATE 4 MG/ML IJ SOLN
4.0000 mg | Freq: Two times a day (BID) | INTRAMUSCULAR | Status: DC
  Administered 2022-01-17 – 2022-01-29 (×24): 4 mg via INTRAVENOUS
  Filled 2022-01-17 (×25): qty 1

## 2022-01-17 MED ORDER — ACETAMINOPHEN 650 MG RE SUPP: 650.0000 mg | Freq: Four times a day (QID) | RECTAL | Status: DC | PRN

## 2022-01-17 MED ORDER — LORAZEPAM 2 MG/ML IJ SOLN: 2.0000 mg | INTRAMUSCULAR | Status: DC | PRN

## 2022-01-17 MED ORDER — ONDANSETRON HCL 4 MG PO TABS: 4.0000 mg | ORAL_TABLET | Freq: Four times a day (QID) | ORAL | Status: DC | PRN

## 2022-01-17 NOTE — ED Provider Notes (Signed)
[facility] DEPT Provider Note   CSN: [id number] Arrival date & time: 01/17/22  1018     History  Chief Complaint  Patient presents with   Seizures    Lindsey Daniels is a 66 y.o. 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   Patient on my assessment with active focal seizure activity involving her left face. Spasms of left facial musculature q1second. Difficult for patient to talk and answer questions but states this has been ongoing for approximately 1 hour and she has no pain. Also states she did take her Keppra this morning. Further history unable to be obtained at this time.   Patient was seen 01/13/2022 at this facility including by this provider after a seizure with postictal period.  Patient was eventually discharged home on her home doses of Keppra.  Per chart review on 01/15/2022, telephone encounter note w/ oncology RN states that patient's brother [place] stated that patient "has not regained any strength to her left hand and arm, unable to use a walker because of that and still requires standby assistance to go to the bathroom.  He also states that she has some odd behavior with reaching for things that are not there.  Speech is thick and harder to understand/slurred mildly. Per Dr Mickeal Skinner instructed to start Decadron 8 mg today and then do Decadron 4 mg BID.  If no improvement after starting Decadron then she should go to Emergency Room to have EEG to rule out seizure activity.  Dr Mickeal Skinner will reevaluate next week to find out if Avastin needs to be reinstated."  Per most recent oncology clinic note from 12/30/21: MRI brain demonstrates good response to avastin, with decreased burden of enhancement, T2 signal.  There is an increase in restricted diffusion which could represent radionecrosis or avastin effect.  Tumor recurrence is  less likely, though possible. We will transition to imaging surveillance. Decadron should remain off if tolerated. Keppra should con't '750mg'$  BID. We ask that Lindsey Daniels return to clinic in 2 months following next brain MRI, or sooner as needed.   HPI     Home Medications Prior to Admission medications   Medication Sig Start Date End Date Taking? Authorizing Provider  amLODipine (NORVASC) 10 MG tablet Take 1 tablet (10 mg total) by mouth daily. 10/06/21   Oswald Hillock, MD  apixaban (ELIQUIS) 5 MG TABS tablet Take 1 tablet (5 mg total) by mouth 2 (two) times daily. 11/18/21   Ventura Sellers, MD  atorvastatin (LIPITOR) 20 MG tablet Take 1 tablet (20 mg total) by mouth daily. 07/25/17   Harrison Mons, PA  cetirizine (ZYRTEC) 10 MG tablet Take 10 mg by mouth daily.    [provider]  cholecalciferol (VITAMIN D) 1000 units tablet Take 1,000 Units by mouth daily.    [provider]  dexamethasone (DECADRON) 4 MG tablet Take 1 tablet (4 mg total) by mouth 2 (two) times daily with a meal. 01/15/22   Vaslow, Acey Lav, MD  fluticasone (FLONASE) 50 MCG/ACT nasal spray Place 1-2 sprays into both nostrils daily. 11/06/20   Wieters, Hallie C, PA-C  levETIRAcetam (KEPPRA) 750 MG tablet Take 2 tablets (1,500 mg total) by mouth 2 (two) times daily. 01/13/22   Carmin Muskrat, MD  losartan (COZAAR) 50 MG tablet Take 1 tablet (50 mg total) by mouth daily. 10/06/21   Oswald Hillock, MD  Multiple Vitamin (MULTIVITAMIN WITH MINERALS) TABS tablet Take 1 tablet by mouth at bedtime.    [provider]  pantoprazole (PROTONIX) 40 MG tablet Take 1 tablet (40 mg total) by mouth daily. 10/06/21   Oswald Hillock, MD  sertraline (ZOLOFT) 100 MG tablet Take 1 tablet (100 mg total) by mouth daily. Patient taking differently: Take 100 mg by mouth at bedtime. 06/30/17   Harrison Mons, PA  valACYclovir (VALTREX) 500 MG tablet Take 1 tablet (500 mg total) by mouth 2 (two) times daily. Takes  only if needed for breakouts Patient not taking: Reported on 12/02/2021 06/30/17   Harrison Mons, PA      Allergies    Rifapentine, Amoxicillin, and Clavulanic acid    Review of Systems   Review of Systems  Unable to perform ROS: Other (seizure activity preventing communication)    Physical Exam Updated Vital Signs BP (!) 143/102   Pulse 61   Temp 98.5 F (36.9 C) (Axillary)   Resp (!) 24   SpO2 100%  Physical Exam General: Patient lying in bed.  HEENT: PERRLA, Sclera anicteric, MMM, trachea midline. Facial muscles contracting w/ focal seizure activity on L side, including blinking Cardiology: RRR, no murmurs/rubs/gallops. BL radial and DP pulses equal bilaterally.  Resp: Normal respiratory rate and effort. CTAB, no wheezes, rhonchi, crackles.  Abd: Soft, non-tender, non-distended. No rebound tenderness or guarding.  GU: Deferred. MSK: No peripheral edema or signs of trauma. Extremities without deformity or TTP. No cyanosis or clubbing. Skin: warm, dry. No rashes or lesions. Neuro: Alert, seems to be oriented to situation, R sided cranial nerves intact. L facial muscles upper and lower with approx q1second contractions. Initially unable to lift LUE up off the bed but then on reevaluation w/ q1second contractions of the LUE. 3/5 strength in LLE, 5/5 strength in RLE. Sensation grossly intact.  Psych: [place]  ED Results / Procedures / Treatments   Labs (all labs ordered are listed, but only abnormal results are displayed) Labs Reviewed  LACTIC ACID, PLASMA - Abnormal; Notable for the following components:      Result Value   Lactic Acid, Venous 2.3 (*)    All other components within normal limits  CBG MONITORING, ED - Abnormal; Notable for the following components:   Glucose-Capillary 43 (*)    All other components within normal limits  CBG MONITORING, ED - Abnormal; Notable for the following components:   Glucose-Capillary 196 (*)    All other components within normal limits   CBC WITH DIFFERENTIAL/PLATELET  LACTIC ACID, PLASMA  URINALYSIS, ROUTINE W REFLEX MICROSCOPIC  CK  LEVETIRACETAM LEVEL  COMPREHENSIVE METABOLIC PANEL  MAGNESIUM  MAGNESIUM  CBG MONITORING, ED  CBG MONITORING, ED    EKG None  Radiology No results found.  Procedures Procedures    Medications Ordered in ED Medications  levETIRAcetam (KEPPRA) IVPB 1000 mg/100 mL premix (has no administration in time range)  acetaminophen (TYLENOL) tablet 650 mg (has no administration in time range)    Or  acetaminophen (TYLENOL) suppository 650 mg (has no administration in time range)  ondansetron (ZOFRAN) tablet 4 mg (has no administration in time range)    Or  ondansetron (ZOFRAN) injection 4 mg (has no administration in time range)  LORazepam (ATIVAN) injection 2 mg (2 mg Intravenous Given 01/17/22 1141)  dextrose 50 % solution 50 mL (50 mLs Intravenous Given 01/17/22 1153)    ED Course/ Medical Decision Making/ A&P  Medical Decision Making Amount and/or Complexity of Data Reviewed Labs: ordered. Decision-making details documented in ED Course. Radiology: ordered.  Risk Prescription drug management. Decision regarding hospitalization.    This patient presents to the ED for concern of focal seizure activity, this involves an extensive number of treatment options, and is a complaint that carries with it a high risk of complications and morbidity.  I considered the following differential and admission for this acute, potentially life threatening condition.   MDM:    Patient w/ focal seizure activity of L face and subsequently LUE. Had been taking keppra and states she took her dose this AM. Unable to obtain significant history and no family present at bedside, however per chart review patient was taking 2 days of decadron and having trouble moving/utilizing LUE, c/w exam here. Will treat with 2 mg IV ativan to stop seizure activity from generalizing and then  reevaluate. Will eval for hypo/hyperglycemia, electrolyte abnormalities, renal injury, rhabdo, other potential causes of seizure activity including infection such as UTI/PNA or drug ingestion. Pt reports compliance w/ meds but will obtain keppra level. Given recent seizure activity as well and representation, will need to c/w neurology for possible admission for further w/u/management.   ED Course:  Clinical Course as of 01/17/22 1309  Sat Jan 17, 2022  1044 Patient with active left sided facial spasms approximately q1second. Consider focal partial seizure. Will give 2 mg IV ativan.  [HN]  [place] to administer ativan [HN]  8841 Glucose-Capillary(!!): 43 Ativan administered. POC BG checked and is 43 mg/dL. Administered 1 amp D50. Will recheck q1h. [HN]  1230 After ativan, patient sleepy but no longer with focal seizure [HN]  1238 Glucose-Capillary(!): 196 [HN]  1248 Neuro consulted. Possibly CBG as possible inciting factor. Recommending MRI brain w and wo, keppra level, loading with keppra now and increasing home dose to 1g BID. Will be admitted at [facility] for further w/u.  [HN]  1259 Consulted to medicine and admitted [HN]    Clinical Course User Index [HN] Audley Hose, MD    Labs: I Ordered, and personally interpreted labs.  The pertinent results include:  initial POC CBG 43, treated w/ 1 amp D50  Imaging Studies ordered: I ordered imaging studies including MRI brain w wo contrast  Additional history Records reviewed   Cardiac Monitoring: The patient was maintained on a cardiac monitor.  I personally viewed and interpreted the cardiac monitored which showed an underlying rhythm of: NSR  Reevaluation: After the interventions noted above, I reevaluated the patient and found that they have :improved  Social Determinants of Health: patient's caretaker is her brother Wes  Disposition:  Admit to medicine at [facility] w/ neuro following  Co morbidities that complicate the  patient evaluation  Past Medical History:  Diagnosis Date   Allergy    Anal fissure    Anxiety    Cataract    Depression    Elevated LFTs    Hyperglycemia    Hyperlipidemia    Hypertension    Osteoarthritis    Pars defect of lumbar spine    Seizures (HCC)    Spondylolisthesis    lumbar     Medicines Meds ordered this encounter  Medications   LORazepam (ATIVAN) injection 2 mg   dextrose 50 % solution 50 mL   levETIRAcetam (KEPPRA) IVPB 1000 mg/100 mL premix   OR Linked Order Group    acetaminophen (TYLENOL) tablet 650 mg    acetaminophen (TYLENOL) suppository 650 mg  OR Linked Order Group    ondansetron (ZOFRAN) tablet 4 mg    ondansetron (ZOFRAN) injection 4 mg    I have reviewed the patients home medicines and have made adjustments as needed  Problem List / ED Course: Problem List Items Addressed This Visit   None Visit Diagnoses     Focal seizure ([place])    -  Primary   Relevant Medications   levETIRAcetam (KEPPRA) IVPB 1000 mg/100 mL premix        This note was created using dictation software, which may contain spelling or grammatical errors.    Audley Hose, MD 01/17/22 [phone]

## 2022-01-17 NOTE — H&P (Signed)
History and Physical    Patient: Lindsey Daniels ZOX:[id number] DOB: 03-Feb-1956 DOA: 01/17/2022 DOS: the patient was seen and examined on 01/17/2022 PCP: Harrison Mons, PA  Patient coming from: Home  Chief Complaint:  Chief Complaint  Patient presents with   Seizures   HPI: Lindsey Daniels is a 66 y.o. female with medical history significant of seasonal allergy, anal fissure, anxiety, depression, cataracts, elevated LFTs, hyperglycemia, hyperlipidemia, hypertension, osteoarthritis pars ephedra lumbar spine, spondylolisthesis of the lumbar spine, partial seizures who presented to the emergency department via EMS due to having a focal seizure an hour before arrival.  Patient was seen in the ER with left-sided facial and LUE twitch.  Her LUE is weak at baseline, but she was able to lift up prior to the seizure.  She was recently admitted for seizure and discharged on 01/14/2022 after increasing Keppra and restarting dexamethasone.  She is unable to provide any information at this time.  Information is provided by the patient's brother and the medical records.  ED course: Initial vital signs were temperature 98.5 F, pulse 75, respirations 24, BP 143/102 mmHg O2 sat 97% on room air.  The patient received 2 mg of lorazepam IVP x 2, dextrose 50% 50 mL IVP and Keppra 1000 mg IVPB.  Lab work: Her CBC was normal.  Glucose was 43 on CBG than 196 then 89 mg/dL.  Lactic acid was normal.  Total CK mildly elevated at 311 units/L.  Magnesium was 1.6 mg/dL.  CMP with a potassium of 2.3 and glucose on 9 6.  The rest of the CMP measurements were normal.  Imaging: Portable 1 view chest radiograph with no active disease.  MRI of the brain was mildly motion degraded.  There was acutely abnormal diffusion in the lateral right perirolandic cortex, favor seizure related in the setting, with no associated enhancement or regional mass effect.  Otherwise stable MRI appearance of the brain status post out Avastin therapy.   Tumoral enhancement and mass effect in the right hemisphere remain decreased compared to August.   Review of Systems: As mentioned in the history of present illness. All other systems reviewed and are negative. Past Medical History:  Diagnosis Date   Allergy    Anal fissure    Anxiety    Cataract    Depression    Elevated LFTs    Hyperglycemia    Hyperlipidemia    Hypertension    Osteoarthritis    Pars defect of lumbar spine    Seizures (HCC)    Spondylolisthesis    lumbar   Past Surgical History:  Procedure Laterality Date   cataract     CRANIOTOMY N/A 10/02/2013   Procedure: 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;  Surgeon: Hosie Spangle, MD;  Location: [place] [facility];  Service: Neurosurgery;  Laterality: N/A;  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   CRANIOTOMY Right 11/29/2020   Procedure: Right Parietal craniotomy for tumor resection;  Surgeon: Ashok Pall, MD;  Location: [facility];  Service: Neurosurgery;  Laterality: Right;   HEEL SPUR SURGERY     Social History:  reports that she has never smoked. She has never used smokeless tobacco. She reports current alcohol use. She reports that she does not use drugs.  Allergies  Allergen Reactions   Rifapentine Other (See Comments)    Flu-like symptoms   Amoxicillin Rash   Clavulanic Acid Rash    Family History  Problem Relation Age of Onset   Mental illness Father  depression   COPD Father    Alcohol abuse Brother    Breast cancer Neg Hx     Prior to Admission medications   Medication Sig Start Date End Date Taking? Authorizing Provider  amLODipine (NORVASC) 10 MG tablet Take 1 tablet (10 mg total) by mouth daily. 10/06/21   Oswald Hillock, MD  apixaban (ELIQUIS) 5 MG TABS tablet Take 1 tablet (5 mg total) by mouth 2 (two) times daily. 11/18/21   Ventura Sellers, MD  atorvastatin (LIPITOR) 20 MG tablet Take 1 tablet (20 mg total) by mouth daily. 07/25/17   Harrison Mons, PA  cetirizine (ZYRTEC)  10 MG tablet Take 10 mg by mouth daily.    [provider]  cholecalciferol (VITAMIN D) 1000 units tablet Take 1,000 Units by mouth daily.    [provider]  dexamethasone (DECADRON) 4 MG tablet Take 1 tablet (4 mg total) by mouth 2 (two) times daily with a meal. 01/15/22   Vaslow, Acey Lav, MD  fluticasone (FLONASE) 50 MCG/ACT nasal spray Place 1-2 sprays into both nostrils daily. 11/06/20   Wieters, Hallie C, PA-C  levETIRAcetam (KEPPRA) 750 MG tablet Take 2 tablets (1,500 mg total) by mouth 2 (two) times daily. 01/13/22   Carmin Muskrat, MD  losartan (COZAAR) 50 MG tablet Take 1 tablet (50 mg total) by mouth daily. 10/06/21   Oswald Hillock, MD  Multiple Vitamin (MULTIVITAMIN WITH MINERALS) TABS tablet Take 1 tablet by mouth at bedtime.    [provider]  pantoprazole (PROTONIX) 40 MG tablet Take 1 tablet (40 mg total) by mouth daily. 10/06/21   Oswald Hillock, MD  sertraline (ZOLOFT) 100 MG tablet Take 1 tablet (100 mg total) by mouth daily. Patient taking differently: Take 100 mg by mouth at bedtime. 06/30/17   Harrison Mons, PA  valACYclovir (VALTREX) 500 MG tablet Take 1 tablet (500 mg total) by mouth 2 (two) times daily. Takes only if needed for breakouts Patient not taking: Reported on 12/02/2021 06/30/17   Harrison Mons, PA    Physical Exam: Vitals:   01/17/22 1033 01/17/22 1035 01/17/22 1037  BP:  (!) 143/102   Pulse:  61   Resp:  (!) 24   Temp:   98.5 F (36.9 C)  TempSrc:   Axillary  SpO2: 97% 100%    Physical Exam Vitals and nursing note reviewed.  Constitutional:      General: She is awake. She is not in acute distress.    Appearance: Normal appearance.  HENT:     Head: Normocephalic.     Mouth/Throat:     Mouth: Mucous membranes are dry.  Eyes:     General: No scleral icterus.    Pupils: Pupils are equal, round, and reactive to light.  Neck:     Vascular: No JVD.  Cardiovascular:     Rate and Rhythm: Normal rate and regular rhythm.      Heart sounds: S1 normal and S2 normal.  Pulmonary:     Effort: No respiratory distress.     Breath sounds: No wheezing, rhonchi or rales.  Abdominal:     General: Bowel sounds are normal. There is no distension.     Palpations: Abdomen is soft.     Tenderness: There is no abdominal tenderness. There is no guarding.  Musculoskeletal:     Cervical back: Neck supple.     Right lower leg: No edema.     Left lower leg: No edema.  Skin:  General: Skin is warm and dry.  Neurological:     General: No focal deficit present.     Mental Status: She is alert and oriented to person, place, and time.     Motor: Weakness present.     Comments: Sedated.  Follows simple commands.  Difficult to assess changes and LUE weakness.  Psychiatric:        Behavior: Behavior is cooperative.     Comments: Sedated after receiving lorazepam and Keppra.   Data Reviewed:  Results are pending, will review when available.  Assessment and Plan: Principal Problem:   Focal seizures ([place]) In the setting of:   Intracranial tumor ([place]) Observation/PCU. Transfer to neurology unit at [facility]. Continue dexamethasone 4 mg IVP every 12 hours. Continue Keppra 1000 mg IVP every 12 hours. Continue lorazepam 2 mg every 4 hours as needed for seizure. Neurology has been consulted and will follow recommendations.  Active Problems:   Hypoglycemia  Last had dexamethasone yesterday. Continue dexamethasone parenterally. No further hypoglycemia. Currently sedated. D5 NS plus KCl infusion. Monitor CBG.    Hypokalemia   Hypomagnesemia Replacement ordered. Follow-up potassium and magnesium level.    Hyperlipidemia On atorvastatin.    Depression Resume sertraline once on diet.    HTN (hypertension) Hold antihypertensives for now. Parenteral antihypertensives as needed.    Pulmonary embolism (HCC) Resume anticoagulation once tolerating p.o. Otherwise, initiate parenteral anticoagulation.    Gastroesophageal  reflux disease without esophagitis On Protonix 40 mg p.o. daily.    Advance Care Planning:   Code Status: Full code.  Consults: Neuro hospitalist team will be consulting.  Family Communication: Her brother was at bedside.  Severity of Illness: The appropriate patient status for this patient is OBSERVATION. Observation status is judged to be reasonable and necessary in order to provide the required intensity of service to ensure the patient's safety. The patient's presenting symptoms, physical exam findings, and initial radiographic and laboratory data in the context of their medical condition is felt to place them at decreased risk for further clinical deterioration. Furthermore, it is anticipated that the patient will be medically stable for discharge from the hospital within 2 midnights of admission.   Author: Reubin Milan, MD 01/17/2022 12:59 PM  For on call review [email or web address].   This document was prepared using Dragon voice recognition software and may contain some unintended transcription errors.

## 2022-01-17 NOTE — ED Provider Notes (Signed)
Blood pressure (!) 150/89, pulse 67, temperature 97.7 F (36.5 C), temperature source Oral, resp. rate (!) 21, SpO2 98 %.   In short, Lindsey Daniels is a 66 y.o. female with a chief complaint of Seizures .  Refer to the original H&P for additional details.  08:37 PM  Contacted by admit team regarding this patient.  Neurology requesting EEG but no beds available at [facility] for admit to have this done.  She is currently boarding in the ED.  In discussion with the hospitalist, we will facilitate an ED to ED transfer for EEG and in person neurology evaluation.  Dr. Maryan Rued is the accepting MD.     Margette Fast, MD 01/17/22 2037

## 2022-01-17 NOTE — ED Notes (Signed)
Patient placed on purwick, but pulled it of place

## 2022-01-17 NOTE — ED Notes (Signed)
Vimpat sent via [facility] with the patient.

## 2022-01-17 NOTE — ED Notes (Signed)
Per [facility], pt is mentating as she has been all day:  lethargic, responsive to pain, able to follow some commands.  Pt able to states name and age.  Responsive to pain.

## 2022-01-17 NOTE — Consult Note (Signed)
Neurology Consultation Reason for Consult: Olevia Bowens Requesting Physician: Seizure  CC: seizure HPI: Lindsey Daniels is a 66 y.o. 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    History is obtained from:Chart review, and patient's brother who is at the bedside. The patient was discharged on 01/12/22 from the ED following witnessed seizure like activity with postictal period. The patient's Keppra was increased from '750mg'$  BID to '1500mg'$  BID. The patient was noticed swinging her left arm at home and had lack of coordination with the left arm. The patient was noted placed items handed to her right hand then placing them in her left hand. She would drop the objects placed in the left such as a cup. When she would approach and object to pick up her left hand would be partial closed and knock over an object. The patient required assistance at home walking at times it appeared like her leg were going to give out. Over the past several days the patient has been sleeping more at home. She did start taking Decadron '4mg'$  BID on 01/15/22. This morning the patient's brother got her up to eat breakfast and noticed left sided facial twitching.   On arrival to the ED the patient was in clinical focal status with twitching of her left face making it difficult for her to talk but she was still conversant and aware.  Seizure activity resolved with 2 mg Ativan and neurology was consulted for further recommendations   Review of Systems  Neurological:  Positive for focal weakness and seizures.       Left facial twitching Left hand twitching  All other systems reviewed and are negative.  Past Medical History:  Diagnosis Date   Allergy    Anal fissure    Anxiety    Cataract    Depression    Elevated LFTs    Hyperglycemia    Hyperlipidemia    Hypertension     Osteoarthritis    Pars defect of lumbar spine    Seizures (HCC)    Spondylolisthesis    lumbar    Family History  Problem Relation Age of Onset   Mental illness Father        depression   COPD Father    Alcohol abuse Brother    Breast cancer Neg Hx     Social History:  reports that she has never smoked. She has never used smokeless tobacco. She reports current alcohol use. She reports that she does not use drugs.  Exam: Current vital signs: BP (!) 143/102   Pulse 61   Temp 98.5 F (36.9 C) (Axillary)   Resp (!) 24   SpO2 100%  Vital signs in last 24 hours: Temp:  [98.5 F (36.9 C)] 98.5 F (36.9 C) (12/02 1037) Pulse Rate:  [61] 61 (12/02 1035) Resp:  [24] 24 (12/02 1035) BP: (143)/(102) 143/102 (12/02 1035) SpO2:  [97 %-100 %] 100 % (12/02 1035)   Physical Exam  Constitutional: Appears well-developed and well-nourished.  Psych: Affect appropriate to situation Eyes: No scleral injection HENT: No oropharyngeal obstruction.  MSK: no joint deformities.  Cardiovascular: Normal rate and regular rhythm.  Perfusing extremities well Respiratory: Effort normal, non-labored breathing GI: Soft.  No distension. There is no tenderness.  Skin: Warm dry and intact visible skin  Neuro: Mental Status: Patient is lethargic, requires repeated stimulation throughout the exam. Able to  state name and that today is Saturday. Voice is hypophonic and dysarthric. She is not speaking in full sentences Neglect of left arm Cranial Nerves: II: Left pupil slightly irregular in comparison to the right III,IV, VI: roving eye movements with forced eye opening  V: Facial sensation is symmetric to temperature VII: Left facial droop, diminished eyebrow raise VIII: hearing is intact to voice X: Uvula elevates symmetrically XI: Shoulder shrug is symmetric. XII: tongue is midline without atrophy or fasciculations.  Motor: Tone is normal. Bulk is normal.  RUE 5/5 LUE 3/5 RLE 5/5 LLE  5/5 Sensory: Localizes to pain on the right, diminished on the left Gait:  Deferred in acute setting    On later attending evaluation, patient awakens slightly more easily.  She is able to track the examiner in both directions, she withdraws to pain in the left upper extremity and can hold the right upper extremity up without drift.  She has slight drift in the left lower extremity but not in the right lower extremity  I have reviewed labs in epic and the results pertinent to this consultation are:  Basic Metabolic Panel: Recent Labs  Lab 01/13/22 1359 01/17/22 1413 01/17/22 1415  NA 139  --  142  K 3.3*  --  2.3*  CL 104  --  104  CO2 17*  --  27  GLUCOSE 142*  --  106*  BUN 6*  --  13  CREATININE 0.68  --  0.49  CALCIUM 9.5  --  9.4  MG  --  1.6*  --     CBC: Recent Labs  Lab 01/13/22 1359 01/17/22 1033  WBC 7.3 9.1  NEUTROABS 5.3 6.6  HGB 15.6* 14.1  HCT 47.7* 44.5  MCV 90.0 92.3  PLT 277 245    Coagulation Studies: No results for input(s): "LABPROT", "INR" in the last 72 hours.    I have reviewed the images obtained:  MRI Brain 1. Mildly motion degraded exam today. 2. Acutely abnormal diffusion in the lateral right perirolandic cortex, favor Seizure related in this setting, with no associated enhancement or regional mass effect. 3. Otherwise stable MRI appearance of the brain status post are Avastin therapy. Tumoral enhancement and mass effect in the right hemisphere remain decreased compared to August.    Assessment: Breakthrough seizures in the setting of multiple electrolyte derangements.  Given MRI findings I am concerned for prolonged focal seizure activity and the possibility of subclinical focal status  Impression:  - Triggers may include hypoglycemia, hypomagnesemia, hypokalemia  Initial phone recommendations - MRI brain w/ and w/o  - Keppra 1000 g load, then increase home dose from 750 to 1000 mg BID (was actually previously increased to  1500 mg twice daily which is greater than appropriate for her current renal function).  Estimated Creatinine Clearance: 65.7 mL/min (by C-G formula based on SCr of 0.68 mg/dL).   CrCl 50 to <80 mL/minute/1.73 m2: 500 mg to 1 g every 12 hours. - Keppra level, Mg, CK, UA, UDS, Chest X-ray  - Admit to medicine at [facility] as she may need EEG to rule out subclinical seizures given poor mental status (not available at [facility] long and given she is not at baseline will need admission)  Additional recommendations: -Given MRI findings, will additionally add Vimpat, 50 mg every 12 hours due to left bundle branch block on EKG, will uptitrate if tolerated well and pending clinical course -Appreciate ED/primary team aggressive management of electrolyte derangements, as these can lower  seizure threshold -LTM EEG on arrival to [facility] to evaluate for subclinical seizures, given no progressive beds available/anticipated overnight, recommend ED to ED transfer for hookup  Lesleigh Noe MD-PhD [facility] [phone] Available 7 AM to 7 PM, outside these hours please contact Neurologist on call listed on AMION

## 2022-01-17 NOTE — ED Triage Notes (Signed)
Per EMS, Pt, from home, had a focal seizure x1hr ago.  Pt denies pain.  Pt has new L side facial twitch and L arm/hand twitch.  Pt is unable to lift L arm.  Sts she was able partially able to lift it prior to seizure.  Pt was recently admitted for seizure and discharged on 11/29.  Pt had increase in keppra and started on decadron.

## 2022-01-18 ENCOUNTER — Inpatient Hospital Stay (HOSPITAL_COMMUNITY): Payer: BC Managed Care – PPO

## 2022-01-18 ENCOUNTER — Other Ambulatory Visit: Payer: Self-pay

## 2022-01-18 ENCOUNTER — Encounter (HOSPITAL_COMMUNITY): Payer: Self-pay | Admitting: Critical Care Medicine

## 2022-01-18 ENCOUNTER — Observation Stay (HOSPITAL_COMMUNITY): Payer: BC Managed Care – PPO

## 2022-01-18 DIAGNOSIS — D496 Neoplasm of unspecified behavior of brain: Secondary | ICD-10-CM | POA: Diagnosis present

## 2022-01-18 DIAGNOSIS — G9340 Encephalopathy, unspecified: Secondary | ICD-10-CM | POA: Diagnosis not present

## 2022-01-18 DIAGNOSIS — Z86711 Personal history of pulmonary embolism: Secondary | ICD-10-CM

## 2022-01-18 DIAGNOSIS — I159 Secondary hypertension, unspecified: Secondary | ICD-10-CM | POA: Diagnosis not present

## 2022-01-18 DIAGNOSIS — E162 Hypoglycemia, unspecified: Secondary | ICD-10-CM | POA: Diagnosis present

## 2022-01-18 DIAGNOSIS — J9601 Acute respiratory failure with hypoxia: Secondary | ICD-10-CM | POA: Diagnosis not present

## 2022-01-18 DIAGNOSIS — F32A Depression, unspecified: Secondary | ICD-10-CM | POA: Diagnosis present

## 2022-01-18 DIAGNOSIS — Z9911 Dependence on respirator [ventilator] status: Secondary | ICD-10-CM

## 2022-01-18 DIAGNOSIS — R1310 Dysphagia, unspecified: Secondary | ICD-10-CM | POA: Diagnosis present

## 2022-01-18 DIAGNOSIS — F419 Anxiety disorder, unspecified: Secondary | ICD-10-CM | POA: Diagnosis present

## 2022-01-18 DIAGNOSIS — G928 Other toxic encephalopathy: Secondary | ICD-10-CM | POA: Diagnosis present

## 2022-01-18 DIAGNOSIS — G40901 Epilepsy, unspecified, not intractable, with status epilepticus: Secondary | ICD-10-CM | POA: Diagnosis not present

## 2022-01-18 DIAGNOSIS — J9600 Acute respiratory failure, unspecified whether with hypoxia or hypercapnia: Secondary | ICD-10-CM

## 2022-01-18 DIAGNOSIS — R739 Hyperglycemia, unspecified: Secondary | ICD-10-CM | POA: Diagnosis not present

## 2022-01-18 DIAGNOSIS — E669 Obesity, unspecified: Secondary | ICD-10-CM | POA: Diagnosis present

## 2022-01-18 DIAGNOSIS — J302 Other seasonal allergic rhinitis: Secondary | ICD-10-CM | POA: Diagnosis present

## 2022-01-18 DIAGNOSIS — K219 Gastro-esophageal reflux disease without esophagitis: Secondary | ICD-10-CM | POA: Diagnosis present

## 2022-01-18 DIAGNOSIS — R34 Anuria and oliguria: Secondary | ICD-10-CM | POA: Diagnosis not present

## 2022-01-18 DIAGNOSIS — R569 Unspecified convulsions: Secondary | ICD-10-CM | POA: Diagnosis not present

## 2022-01-18 DIAGNOSIS — R1110 Vomiting, unspecified: Secondary | ICD-10-CM | POA: Diagnosis not present

## 2022-01-18 DIAGNOSIS — E785 Hyperlipidemia, unspecified: Secondary | ICD-10-CM | POA: Diagnosis present

## 2022-01-18 DIAGNOSIS — Z66 Do not resuscitate: Secondary | ICD-10-CM | POA: Diagnosis not present

## 2022-01-18 DIAGNOSIS — R471 Dysarthria and anarthria: Secondary | ICD-10-CM | POA: Diagnosis present

## 2022-01-18 DIAGNOSIS — G40101 Localization-related (focal) (partial) symptomatic epilepsy and epileptic syndromes with simple partial seizures, not intractable, with status epilepticus: Secondary | ICD-10-CM | POA: Diagnosis present

## 2022-01-18 DIAGNOSIS — M479 Spondylosis, unspecified: Secondary | ICD-10-CM | POA: Diagnosis present

## 2022-01-18 DIAGNOSIS — E876 Hypokalemia: Secondary | ICD-10-CM | POA: Diagnosis present

## 2022-01-18 DIAGNOSIS — C719 Malignant neoplasm of brain, unspecified: Secondary | ICD-10-CM | POA: Diagnosis not present

## 2022-01-18 DIAGNOSIS — I10 Essential (primary) hypertension: Secondary | ICD-10-CM | POA: Diagnosis present

## 2022-01-18 DIAGNOSIS — R29810 Facial weakness: Secondary | ICD-10-CM | POA: Diagnosis present

## 2022-01-18 DIAGNOSIS — G8194 Hemiplegia, unspecified affecting left nondominant side: Secondary | ICD-10-CM | POA: Diagnosis present

## 2022-01-18 DIAGNOSIS — D72829 Elevated white blood cell count, unspecified: Secondary | ICD-10-CM | POA: Diagnosis not present

## 2022-01-18 LAB — COMPREHENSIVE METABOLIC PANEL
ALT: 18 U/L (ref 0–44)
AST: 29 U/L (ref 15–41)
Albumin: 3.1 g/dL — ABNORMAL LOW (ref 3.5–5.0)
Alkaline Phosphatase: 39 U/L (ref 38–126)
Anion gap: 7 (ref 5–15)
BUN: 5 mg/dL — ABNORMAL LOW (ref 8–23)
CO2: 22 mmol/L (ref 22–32)
Calcium: 8.2 mg/dL — ABNORMAL LOW (ref 8.9–10.3)
Chloride: 112 mmol/L — ABNORMAL HIGH (ref 98–111)
Creatinine, Ser: 0.47 mg/dL (ref 0.44–1.00)
GFR, Estimated: >60 mL/min (ref >60)
Glucose, Bld: 128 mg/dL — ABNORMAL HIGH (ref 70–99)
Potassium: 3.7 mmol/L (ref 3.5–5.1)
Sodium: 141 mmol/L (ref 135–145)
Total Bilirubin: 0.5 mg/dL (ref 0.3–1.2)
Total Protein: 5.2 g/dL — ABNORMAL LOW (ref 6.5–8.1)

## 2022-01-18 LAB — I-STAT ARTERIAL BLOOD GAS, ED
Acid-base deficit: 3 mmol/L — ABNORMAL HIGH (ref 0.0–2.0)
Bicarbonate: 23.1 mmol/L (ref 20.0–28.0)
Calcium, Ion: 1.21 mmol/L (ref 1.15–1.40)
HCT: 38 % (ref 36.0–46.0)
Hemoglobin: 12.9 g/dL (ref 12.0–15.0)
O2 Saturation: 100 %
Patient temperature: 98.6
Potassium: 3.6 mmol/L (ref 3.5–5.1)
Sodium: 141 mmol/L (ref 135–145)
TCO2: 24 mmol/L (ref 22–32)
pCO2 arterial: 45.5 mmHg (ref 32–48)
pH, Arterial: 7.313 — ABNORMAL LOW (ref 7.35–7.45)
pO2, Arterial: 561 mmHg — ABNORMAL HIGH (ref 83–108)

## 2022-01-18 LAB — CBC
HCT: 31.4 % — ABNORMAL LOW (ref 36.0–46.0)
Hemoglobin: 10.6 g/dL — ABNORMAL LOW (ref 12.0–15.0)
MCH: 30.5 pg (ref 26.0–34.0)
MCHC: 33.8 g/dL (ref 30.0–36.0)
MCV: 90.2 fL (ref 80.0–100.0)
Platelets: 174 10*3/uL (ref 150–400)
RBC: 3.48 MIL/uL — ABNORMAL LOW (ref 3.87–5.11)
RDW: 13.5 % (ref 11.5–15.5)
WBC: 4.2 10*3/uL (ref 4.0–10.5)
nRBC: 0 % (ref 0.0–0.2)

## 2022-01-18 LAB — CBG MONITORING, ED
Glucose-Capillary: 165 mg/dL — ABNORMAL HIGH (ref 70–99)
Glucose-Capillary: 127 mg/dL — ABNORMAL HIGH (ref 70–99)
Glucose-Capillary: 126 mg/dL — ABNORMAL HIGH (ref 70–99)
Glucose-Capillary: 159 mg/dL — ABNORMAL HIGH (ref 70–99)
Glucose-Capillary: 140 mg/dL — ABNORMAL HIGH (ref 70–99)

## 2022-01-18 LAB — RAPID URINE DRUG SCREEN, HOSP PERFORMED
Amphetamines: NOT DETECTED
Barbiturates: NOT DETECTED
Benzodiazepines: POSITIVE — AB
Cocaine: NOT DETECTED
Opiates: NOT DETECTED
Tetrahydrocannabinol: NOT DETECTED

## 2022-01-18 LAB — URINALYSIS, ROUTINE W REFLEX MICROSCOPIC
Glucose, UA: 100 mg/dL — AB
Hgb urine dipstick: NEGATIVE
Ketones, ur: 15 mg/dL — AB
Leukocytes,Ua: NEGATIVE
Nitrite: NEGATIVE
Protein, ur: NEGATIVE mg/dL
Specific Gravity, Urine: 1.025 (ref 1.005–1.030)
pH: 6.5 (ref 5.0–8.0)

## 2022-01-18 LAB — MRSA NEXT GEN BY PCR, NASAL: MRSA by PCR Next Gen: NOT DETECTED

## 2022-01-18 LAB — HIV ANTIBODY (ROUTINE TESTING W REFLEX): HIV Screen 4th Generation wRfx: NONREACTIVE

## 2022-01-18 LAB — GLUCOSE, CAPILLARY
Glucose-Capillary: 109 mg/dL — ABNORMAL HIGH (ref 70–99)
Glucose-Capillary: 116 mg/dL — ABNORMAL HIGH (ref 70–99)
Glucose-Capillary: 128 mg/dL — ABNORMAL HIGH (ref 70–99)

## 2022-01-18 LAB — MAGNESIUM: Magnesium: 1.8 mg/dL (ref 1.7–2.4)

## 2022-01-18 MED ORDER — MIDAZOLAM HCL 2 MG/2ML IJ SOLN
INTRAMUSCULAR | Status: AC
  Administered 2022-01-18: 2 mg via INTRAVENOUS
  Filled 2022-01-18: qty 2

## 2022-01-18 MED ORDER — MIDAZOLAM HCL 2 MG/2ML IJ SOLN: 2.0000 mg | Freq: Once | INTRAMUSCULAR | Status: AC

## 2022-01-18 MED ORDER — LEVETIRACETAM IN NACL 1000 MG/100ML IV SOLN
1000.0000 mg | INTRAVENOUS | Status: AC
  Administered 2022-01-18 (×2): 1000 mg via INTRAVENOUS
  Filled 2022-01-18: qty 100

## 2022-01-18 MED ORDER — FENTANYL CITRATE PF 50 MCG/ML IJ SOSY
PREFILLED_SYRINGE | INTRAMUSCULAR | Status: AC
  Administered 2022-01-18: 100 ug via INTRAVENOUS
  Filled 2022-01-18: qty 2

## 2022-01-18 MED ORDER — SODIUM CHLORIDE 0.9 % IV SOLN: 2000.0000 mg | Freq: Once | INTRAVENOUS | Status: DC

## 2022-01-18 MED ORDER — ORAL CARE MOUTH RINSE
15.0000 mL | OROMUCOSAL | Status: DC
  Administered 2022-01-18 – 2022-01-23 (×60): 15 mL via OROMUCOSAL

## 2022-01-18 MED ORDER — HEPARIN (PORCINE) 25000 UT/250ML-% IV SOLN
1000.0000 [IU]/h | INTRAVENOUS | Status: DC
  Administered 2022-01-18: 1000 [IU]/h via INTRAVENOUS
  Filled 2022-01-18: qty 250

## 2022-01-18 MED ORDER — DOCUSATE SODIUM 50 MG/5ML PO LIQD
100.0000 mg | Freq: Two times a day (BID) | ORAL | Status: DC
  Administered 2022-01-18 – 2022-01-23 (×8): 100 mg
  Filled 2022-01-18 (×11): qty 10

## 2022-01-18 MED ORDER — FAMOTIDINE 20 MG PO TABS
20.0000 mg | ORAL_TABLET | Freq: Two times a day (BID) | ORAL | Status: DC
  Administered 2022-01-18 – 2022-01-20 (×5): 20 mg
  Filled 2022-01-18 (×6): qty 1

## 2022-01-18 MED ORDER — LACTATED RINGERS IV BOLUS
1000.0000 mL | Freq: Once | INTRAVENOUS | Status: AC
  Administered 2022-01-18: 1000 mL via INTRAVENOUS

## 2022-01-18 MED ORDER — ROCURONIUM BROMIDE 10 MG/ML (PF) SYRINGE
PREFILLED_SYRINGE | INTRAVENOUS | Status: AC
  Administered 2022-01-18: 75 mg via INTRAVENOUS
  Filled 2022-01-18: qty 10

## 2022-01-18 MED ORDER — ROCURONIUM BROMIDE 50 MG/5ML IV SOLN
100.0000 mg | Freq: Once | INTRAVENOUS | Status: AC
  Administered 2022-01-18: 75 mg via INTRAVENOUS
  Filled 2022-01-18: qty 10

## 2022-01-18 MED ORDER — LEVETIRACETAM IN NACL 1000 MG/100ML IV SOLN
1000.0000 mg | Freq: Two times a day (BID) | INTRAVENOUS | Status: DC
  Administered 2022-01-18 – 2022-01-28 (×20): 1000 mg via INTRAVENOUS
  Filled 2022-01-18 (×22): qty 100

## 2022-01-18 MED ORDER — FENTANYL CITRATE PF 50 MCG/ML IJ SOSY
100.0000 ug | PREFILLED_SYRINGE | INTRAMUSCULAR | Status: DC | PRN
  Administered 2022-01-18 – 2022-01-23 (×5): 100 ug via INTRAVENOUS
  Filled 2022-01-18 (×5): qty 2

## 2022-01-18 MED ORDER — MIDAZOLAM HCL 2 MG/2ML IJ SOLN
1.0000 mg | INTRAMUSCULAR | Status: DC | PRN
  Administered 2022-01-22 – 2022-01-23 (×3): 2 mg via INTRAVENOUS
  Filled 2022-01-18 (×3): qty 2

## 2022-01-18 MED ORDER — CHLORHEXIDINE GLUCONATE CLOTH 2 % EX PADS
6.0000 | MEDICATED_PAD | Freq: Every day | CUTANEOUS | Status: DC
  Administered 2022-01-18 – 2022-01-24 (×7): 6 via TOPICAL

## 2022-01-18 MED ORDER — PROSOURCE TF20 ENFIT COMPATIBL EN LIQD
60.0000 mL | Freq: Every day | ENTERAL | Status: DC
  Administered 2022-01-19: 60 mL
  Filled 2022-01-18: qty 60

## 2022-01-18 MED ORDER — ORAL CARE MOUTH RINSE: 15.0000 mL | OROMUCOSAL | Status: DC | PRN

## 2022-01-18 MED ORDER — FAMOTIDINE 20 MG PO TABS: 20.0000 mg | ORAL_TABLET | Freq: Two times a day (BID) | ORAL | Status: DC

## 2022-01-18 MED ORDER — LACTATED RINGERS IV BOLUS
500.0000 mL | Freq: Once | INTRAVENOUS | Status: AC
  Administered 2022-01-18: 500 mL via INTRAVENOUS

## 2022-01-18 MED ORDER — LEVETIRACETAM IN NACL 1500 MG/100ML IV SOLN: 1500.0000 mg | Freq: Two times a day (BID) | INTRAVENOUS | Status: DC

## 2022-01-18 MED ORDER — POLYETHYLENE GLYCOL 3350 17 G PO PACK
17.0000 g | PACK | Freq: Every day | ORAL | Status: DC
  Administered 2022-01-18 – 2022-01-22 (×4): 17 g
  Filled 2022-01-18 (×5): qty 1

## 2022-01-18 MED ORDER — CLOBAZAM 10 MG PO TABS
5.0000 mg | ORAL_TABLET | Freq: Every day | ORAL | Status: DC
  Administered 2022-01-18: 5 mg
  Filled 2022-01-18: qty 0.5
  Filled 2022-01-18: qty 1

## 2022-01-18 MED ORDER — PROPOFOL 1000 MG/100ML IV EMUL
0.0000 ug/kg/min | INTRAVENOUS | Status: DC
  Administered 2022-01-18: 25 ug/kg/min via INTRAVENOUS
  Administered 2022-01-18: 20 ug/kg/min via INTRAVENOUS
  Administered 2022-01-19 – 2022-01-20 (×5): 25 ug/kg/min via INTRAVENOUS
  Administered 2022-01-21: 30 ug/kg/min via INTRAVENOUS
  Administered 2022-01-21: 25 ug/kg/min via INTRAVENOUS
  Administered 2022-01-22 (×2): 30 ug/kg/min via INTRAVENOUS
  Filled 2022-01-18 (×13): qty 100

## 2022-01-18 MED ORDER — CLOBAZAM 10 MG PO TABS
5.0000 mg | ORAL_TABLET | Freq: Once | ORAL | Status: AC
  Administered 2022-01-18: 5 mg
  Filled 2022-01-18: qty 0.5

## 2022-01-18 MED ORDER — VITAL HIGH PROTEIN PO LIQD
1000.0000 mL | ORAL | Status: DC
  Administered 2022-01-19: 1000 mL
  Filled 2022-01-18: qty 1000

## 2022-01-18 NOTE — Procedures (Signed)
EEG Procedure CPT/Type of Study: [id number]; 2-12hr EEG with video Referring Provider: Avon Gully Primary Neurological Diagnosis: seizures  History: This is a 66 yr old patient, undergoing an EEG to evaluate for seizures, status epilepticus. Clinical State: disoriented  Technical Description:  The EEG was performed using standard setting per the guidelines of American Clinical Neurophysiology Society (ACNS).  A minimum of 21 electrodes were placed on scalp according to the International 10-20 or/and 10-10 Systems. Supplemental electrodes were placed as needed. Single EKG electrode was also used to detect cardiac arrhythmia. Patient's behavior was continuously recorded on video simultaneously with EEG. A minimum of 16 channels were used for data display. Each epoch of study was reviewed manually daily and as needed using standard referential and bipolar montages. Computerized quantitative EEG analysis (such as compressed spectral array analysis, trending, automated spike & seizure detection) were used as indicated.   Day 1: from 0055 01/18/22 to 0730 01/18/22  EEG Description: Overall Amplitude:Normal Predominant Frequency: The background activity showed theta and some delta, 3-'6Hz'$ , occurring frequently.  Superimposed Frequencies: sparse beta activity, decreased on the right The background was asymmetric, decreased fast on the right  Background Abnormalities: focal slowing, right centro-temporal focal delta slowing Rhythmic or periodic pattern: Yes; LPDs in the right centro-temporal region occurring frequently, 1-'2Hz'$  frequency with overriding fast activity Epileptiform activity: Yes; right central spiky morphology with subtle evolution Electrographic seizures: Yes; subtle evolution to LPDs with brief runs lasting about 10 seconds duration with buildup in frequency of LPDs to '3Hz'$ , followed by periods of voltage attenuation. Events: no   Breach rhythm: no  Reactivity: Present  Stimulation  procedures:  Hyperventilation: not done Photic stimulation: no change  Sleep Background: Stage II  EKG: irregular rhythm  Impression: This was an abnormal continuous video EEG due to right central spiky LPDs with subtle ictal evolution, indicative of ongoing focal epileptogenicity in that region.

## 2022-01-18 NOTE — ED Notes (Signed)
Pt turned to left side with pillows for support.

## 2022-01-18 NOTE — Consult Note (Addendum)
NAME:  Lindsey Daniels, MRN:  [id number], DOB:  08-07-1955, LOS: 0 ADMISSION DATE:  01/17/2022, CONSULTATION DATE:  01/18/22 REFERRING MD:  Curly Shores, CHIEF COMPLAINT:  AMS   History of Present Illness:   66 yo F PMH pleomorphic xanthoastocytoma s/p crani (2015 and 2022) and XRT, sz disorder, Hx PE- takes eliquis, HTN, presented to [facility] ED 12/2 with AMS and focal seizure.  It sounds like she has had progressive decline at home related to her seizures. Lateral to [facility] ED.  She was admitted to [facility] with neuro consult. Keppra loaded. MRI brain had an acute change of abnormal diffusion in lateral R perirolandic cortex. cEEG with focal epileptogenicity.    On 12/3 despite escalation of AEDs, still having evidence of focal seizures. Pt remains very somnolent. With need for AED escalation and pts current mental status, PCCM is consulted for elective intubation.    Pertinent  Medical History  pleomorphic xanthoastocytoma s/p crani (2015 and 2022) and XRT, sz disorder, Hx PE- takes eliquis, HTN  Significant Hospital Events: Including procedures, antibiotic start and stop dates in addition to other pertinent events   12/2 [facility] Ed to [facility] ED -- AMS, focal status. Keppra loaded., neuro consulted admit to [facility] 12/3 need for AED escalation. Mentation concerning for airway protection in CNS depressing meds increased. PCCM consulted for elective intubation.   Interim History / Subjective:   Ongoing focal status epilepticus  Remains somnolent   Objective   Blood pressure (!) 144/90, pulse 96, temperature (!) 97.3 F (36.3 C), temperature source Axillary, resp. rate 18, height '5\' 3"'$  (1.6 m), weight 74 kg, SpO2 97 %.        Intake/Output Summary (Last 24 hours) at 01/18/2022 1146 Last data filed at 01/18/2022 1000 Gross per 24 hour  Intake 2175.64 ml  Output --  Net 2175.64 ml   Filed Weights   01/18/22 0934  Weight: 74 kg    Examination: General: chronically ill older adult F  HENT: [place]. EEG in  place Lungs: CTAb. Unlabored respirations Cardiovascular: rrr s1s2 no rgm cap refill brisk Abdomen: soft ndnt  Extremities: LUE bruising over shoulder and upper arm. No acute joint deformity  Neuro: moving spontaneously. Somnolent. Does not follow commands  GU: defer  Resolved Hospital Problem list     Assessment & Plan:   Focal status epilepticus Underlying seizure disorder  Acute encephalopathy in setting of above  Hx pleomorphic xanthoastocytoma s/p crani (2015 and 2022) and XRT P -will electively intubate to facilitate needed increase in AED coverage -AED per neuro  -will admit to ICU   Acute respiratory failure / Need for mechanical ventilation for airway protection P -elective intubation as above  -CXR ABG -lung protective ventilation, wean as able -VAP, pulm hygiene  -prop PRN fent for sedation   Hypomagnesemia Hypokalemia  -Mag goal >2 K 4  -add on a mag to todays labs   HTN  HLD -atorvastatin -will hold on restarting home HTN until we see how her BP equilibrates with sedation   Hx PE on home eliquis -lovenox per pharmacy   Inadequate PO intake -OGT placement with intubation -RDN consult for EN when confirmed placement   Best Practice (right click and "Reselect all SmartList Selections" daily)   Diet/type: tubefeeds and NPO DVT prophylaxis: systemic heparin GI prophylaxis: PPI Lines: N/A Foley:  N/A Code Status:  limited Last date of multidisciplinary goals of care discussion [12/3 neuro d/w family prior to ICU admission] Neuro discussed code status and elective intubation:  temporary intubation for seizure control but she would not want CPR or extended life supports   Labs   CBC: Recent Labs  Lab 01/13/22 1359 01/17/22 1033 01/18/22 0405  WBC 7.3 9.1 4.2  NEUTROABS 5.3 6.6  --   HGB 15.6* 14.1 10.6*  HCT 47.7* 44.5 31.4*  MCV 90.0 92.3 90.2  PLT 277 245 470    Basic Metabolic Panel: Recent Labs  Lab 01/13/22 1359 01/17/22 1413  01/17/22 1415 01/18/22 0405 01/18/22 0720  NA 139  --  142 UNSATISFACTORY SPECIMEN. CLIENT REQUESTS SPECIMEN TO BE ANALYZED. 141  K 3.3*  --  2.3* UNSATISFACTORY SPECIMEN. CLIENT REQUESTS SPECIMEN TO BE ANALYZED. 3.7  CL 104  --  104 UNSATISFACTORY SPECIMEN. CLIENT REQUESTS SPECIMEN TO BE ANALYZED. 112*  CO2 17*  --  27 UNSATISFACTORY SPECIMEN. CLIENT REQUESTS SPECIMEN TO BE ANALYZED. 22  GLUCOSE 142*  --  106* UNSATISFACTORY SPECIMEN. CLIENT REQUESTS SPECIMEN TO BE ANALYZED. 128*  BUN 6*  --  13 UNSATISFACTORY SPECIMEN. CLIENT REQUESTS SPECIMEN TO BE ANALYZED. 5*  CREATININE 0.68  --  0.49 UNSATISFACTORY SPECIMEN. CLIENT REQUESTS SPECIMEN TO BE ANALYZED. 0.47  CALCIUM 9.5  --  9.4 UNSATISFACTORY SPECIMEN. CLIENT REQUESTS SPECIMEN TO BE ANALYZED. 8.2*  MG  --  1.6*  --   --   --    GFR: Estimated Creatinine Clearance: 65.7 mL/min (by C-G formula based on SCr of 0.47 mg/dL). Recent Labs  Lab 01/13/22 1359 01/17/22 1033 01/17/22 1047 01/17/22 1411 01/18/22 0405  WBC 7.3 9.1  --   --  4.2  LATICACIDVEN  --   --  2.3* 1.5  --     Liver Function Tests: Recent Labs  Lab 01/13/22 1359 01/17/22 1415 01/18/22 0405 01/18/22 0720  AST 41 32 UNSATISFACTORY SPECIMEN. CLIENT REQUESTS SPECIMEN TO BE ANALYZED. 29  ALT 24 20 UNSATISFACTORY SPECIMEN. CLIENT REQUESTS SPECIMEN TO BE ANALYZED. [place] CLIENT REQUESTS SPECIMEN TO BE ANALYZED. 39  BILITOT 1.9* 0.8 UNSATISFACTORY SPECIMEN. CLIENT REQUESTS SPECIMEN TO BE ANALYZED. 0.5  PROT 6.9 6.9 UNSATISFACTORY SPECIMEN. CLIENT REQUESTS SPECIMEN TO BE ANALYZED. 5.2*  ALBUMIN 3.9 3.7 UNSATISFACTORY SPECIMEN. CLIENT REQUESTS SPECIMEN TO BE ANALYZED. 3.1*   No results for input(s): "LIPASE", "AMYLASE" in the last 168 hours. No results for input(s): "AMMONIA" in the last 168 hours.  ABG No results found for: "PHART", "PCO2ART", "PO2ART", "HCO3", "TCO2", "ACIDBASEDEF", "O2SAT"   Coagulation Profile: No results  for input(s): "INR", "PROTIME" in the last 168 hours.  Cardiac Enzymes: Recent Labs  Lab 01/17/22 1413  CKTOTAL 311*    HbA1C: Hemoglobin A1C  Date/Time Value Ref Range Status  06/07/2015 08:30 AM 5.5  Final   Hgb A1c MFr Bld  Date/Time Value Ref Range Status  06/30/2017 06:15 PM 5.4 4.8 - 5.6 % Final    Comment:             Prediabetes: 5.7 - 6.4          Diabetes: >6.4          Glycemic control for adults with diabetes: <7.0     CBG: Recent Labs  Lab 01/17/22 2033 01/17/22 2238 01/17/22 2348 01/18/22 0147 01/18/22 0523  GLUCAP 95 141* 157* 165* 127*    Review of Systems:   Unable to obtain due to encephalopathy   Past Medical History:  She,  has a past medical history of Allergy, Anal fissure, Anxiety, Cataract, Depression, Elevated LFTs, Hyperglycemia, Hyperlipidemia, Hypertension, Osteoarthritis, Pars defect of lumbar  spine, Seizures ([place]), and Spondylolisthesis.   Surgical History:   Past Surgical History:  Procedure Laterality Date   cataract     CRANIOTOMY N/A 10/02/2013   Procedure: 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;  Surgeon: Hosie Spangle, MD;  Location: [place] [facility];  Service: Neurosurgery;  Laterality: N/A;  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   CRANIOTOMY Right 11/29/2020   Procedure: Right Parietal craniotomy for tumor resection;  Surgeon: Ashok Pall, MD;  Location: [place];  Service: Neurosurgery;  Laterality: Right;   HEEL SPUR SURGERY       Social History:   reports that she has never smoked. She has never used smokeless tobacco. She reports current alcohol use. She reports that she does not use drugs.   Family History:  Her family history includes Alcohol abuse in her brother; COPD in her father; Mental illness in her father. There is no history of Breast cancer.   Allergies Allergies  Allergen Reactions   Rifapentine Other (See Comments)    Flu-like symptoms   Amoxicillin Rash   Clavulanic Acid Rash      Home Medications  Prior to Admission medications   Medication Sig Start Date End Date Taking? Authorizing Provider  amLODipine (NORVASC) 10 MG tablet Take 1 tablet (10 mg total) by mouth daily. 10/06/21   Oswald Hillock, MD  apixaban (ELIQUIS) 5 MG TABS tablet Take 1 tablet (5 mg total) by mouth 2 (two) times daily. 11/18/21   Ventura Sellers, MD  atorvastatin (LIPITOR) 20 MG tablet Take 1 tablet (20 mg total) by mouth daily. 07/25/17   Harrison Mons, PA  cetirizine (ZYRTEC) 10 MG tablet Take 10 mg by mouth daily.    [provider]  cholecalciferol (VITAMIN D) 1000 units tablet Take 1,000 Units by mouth daily.    [provider]  dexamethasone (DECADRON) 4 MG tablet Take 1 tablet (4 mg total) by mouth 2 (two) times daily with a meal. 01/15/22   Vaslow, Acey Lav, MD  fluticasone (FLONASE) 50 MCG/ACT nasal spray Place 1-2 sprays into both nostrils daily. 11/06/20   Wieters, Hallie C, PA-C  levETIRAcetam (KEPPRA) 750 MG tablet Take 2 tablets (1,500 mg total) by mouth 2 (two) times daily. 01/13/22   Carmin Muskrat, MD  losartan (COZAAR) 50 MG tablet Take 1 tablet (50 mg total) by mouth daily. 10/06/21   Oswald Hillock, MD  Multiple Vitamin (MULTIVITAMIN WITH MINERALS) TABS tablet Take 1 tablet by mouth at bedtime.    [provider]  pantoprazole (PROTONIX) 40 MG tablet Take 1 tablet (40 mg total) by mouth daily. 10/06/21   Oswald Hillock, MD  sertraline (ZOLOFT) 100 MG tablet Take 1 tablet (100 mg total) by mouth daily. Patient taking differently: Take 100 mg by mouth at bedtime. 06/30/17   Harrison Mons, PA  valACYclovir (VALTREX) 500 MG tablet Take 1 tablet (500 mg total) by mouth 2 (two) times daily. Takes only if needed for breakouts Patient not taking: Reported on 12/02/2021 06/30/17   Harrison Mons, PA     Critical care time:      CRITICAL CARE Performed by: Cristal Generous   Total critical care time: 50 minutes  Critical care time was exclusive of  separately billable procedures and treating other patients. Critical care was necessary to treat or prevent imminent or life-threatening deterioration.  Critical care was time spent personally by me on the following activities: development of treatment plan with patient and/or surrogate as well  as nursing, discussions with consultants, evaluation of patient's response to treatment, examination of patient, obtaining history from patient or surrogate, ordering and performing treatments and interventions, ordering and review of laboratory studies, ordering and review of radiographic studies, pulse oximetry and re-evaluation of patient's condition.  Eliseo Gum MSN, AGACNP-BC [place] for pager  01/18/2022, 11:46 AM

## 2022-01-18 NOTE — ED Notes (Signed)
[name] updated with patient plan of care and patient status.

## 2022-01-18 NOTE — ED Notes (Signed)
MD at bedside. 

## 2022-01-18 NOTE — Progress Notes (Signed)
Neurology Progress Note   Subjective: - Minimally interactive   Exam: Current vital signs: BP 115/70   Pulse 81   Temp (!) 97.3 F (36.3 C) (Axillary)   Resp 19   Ht '5\' 3"'$  (1.6 m)   Wt 74 kg   SpO2 98%   BMI 28.89 kg/m  Vital signs in last 24 hours: Temp:  [97.3 F (36.3 C)-98.3 F (36.8 C)] 97.3 F (36.3 C) (12/03 1003) Pulse Rate:  [67-90] 81 (12/03 1030) Resp:  [14-33] 19 (12/03 1030) BP: (115-158)/(70-112) 115/70 (12/03 1030) SpO2:  [95 %-100 %] 98 % (12/03 1030) Weight:  [74 kg] 74 kg (12/03 0934)   Gen: In bed, comfortable  Resp: slow breathing  Cardiac: Perfusing extremities well  Abd: soft, nt  Neuro: MS: Very sleepy, alerts to repeats stimulation and follows simple commands such as wiggle toes, more brisk on the right than the left CN: Right gaze preference, Left pupil 6 mm to 4 mm, right pupil 4 mm to 2 mm, roving eye movements  Sensory/Motor: Withdraws to noxious stim on the left. No facial twitching.   Pertinent Labs:  Basic Metabolic Panel: Recent Labs  Lab 01/13/22 1359 01/17/22 1413 01/17/22 1415 01/18/22 0720  NA 139  --  142 141  K 3.3*  --  2.3* 3.7  CL 104  --  104 112*  CO2 17*  --  27 22  GLUCOSE 142*  --  106* 128*  BUN 6*  --  13 5*  CREATININE 0.68  --  0.49 0.47  CALCIUM 9.5  --  9.4 8.2*  MG  --  1.6*  --   --     CBC: Recent Labs  Lab 01/13/22 1359 01/17/22 1033 01/18/22 0405  WBC 7.3 9.1 4.2  NEUTROABS 5.3 6.6  --   HGB 15.6* 14.1 10.6*  HCT 47.7* 44.5 31.4*  MCV 90.0 92.3 90.2  PLT 277 245 174    Coagulation Studies: No results for input(s): "LABPROT", "INR" in the last 72 hours.    EEG:  This was an abnormal continuous video EEG due to right central spiky LPDs with subtle ictal evolution, indicative of ongoing focal epileptogenicity in that region.    Impression: Patient meets criteria for focal status epilepticus given failure to return to baseline and continuing concerning for brief seizures on EEG.  Due  to failure to control these events with Keppra and Vimpat despite dose increases, I do think we should proceed with intubation for seizure control. Dicussed with both [facility] (Wes and friend Mateo Flow)   Recommendations: - Continue long-term EEG monitoring - Continue keppra 1000 mg BID - Continue vimpat 100 mg BID  - Intubation w/ sedation with propofol - Neurology will continue to follow Estimated Creatinine Clearance: 65.7 mL/min (by C-G formula based on SCr of 0.47 mg/dL).   CrCl 80 to 130 mL/minute/1.73 m2: 500 mg to 1.5 g every 12 hours.  CrCl 50 to <80 mL/minute/1.73 m2: 500 mg to 1 g every 12 hours.  CrCl 30 to <50 mL/minute/1.73 m2: 250 to 750 mg every 12 hours.  CrCl 15 to <30 mL/minute/1.73 m2: 250 to 500 mg every 12 hours.  CrCl <15 mL/minute/1.73 m2: 250 to 500 mg every 24 hours (expert opinion).   Lesleigh Noe MD-PhD [facility] [phone]   CRITICAL CARE Performed by: Lorenza Chick   Total critical care time: 60 minutes  Critical care time was exclusive of separately billable procedures and treating other patients.  Critical care was  necessary to treat or prevent imminent or life-threatening deterioration.  Critical care was time spent personally by me on the following activities: development of treatment plan with patient and/or surrogate as well as nursing, discussions with consultants, evaluation of patient's response to treatment, examination of patient, obtaining history from patient or surrogate, ordering and performing treatments and interventions, ordering and review of laboratory studies, ordering and review of radiographic studies, pulse oximetry and re-evaluation of patient's condition.

## 2022-01-18 NOTE — ED Notes (Signed)
Patient turned to right side with pillows for support.

## 2022-01-18 NOTE — Progress Notes (Signed)
Consulted by neurology due to concern for airway protection.  BP (!) 132/91   Pulse (!) 115   Temp (!) 97.3 F (36.3 C) (Axillary)   Resp (!) 30   Ht '5\' 3"'$  (1.6 m)   Wt 74 kg   SpO2 99%   BMI 28.89 kg/m  Sleeping, minimally arousable during exam, including repositioning her for intubation. Moved her extremities occasionally, but not opening eyes or interacting with me at all.   Intubated for airway protection after pre-oxygenation. OGT palced by me, confirmed on CXR to be in the stomach.  Fluid bolus given in preparation for intubation. Propofol for sedation post-intubation. CXR confirms appropriate ETT position.   Full consult not to follow.  Julian Hy, DO 01/18/22 12:52 PM [facility]

## 2022-01-18 NOTE — Progress Notes (Signed)
EEG maint complete. Pt moved to 4N20. [facility] monitoring now. Push button complete

## 2022-01-18 NOTE — Progress Notes (Addendum)
eLink Physician-Brief Progress Note Patient Name: Lindsey Daniels DOB: 25-Oct-1955 MRN: [id number]   Date of Service  01/18/2022  HPI/Events of Note  66 y/o woman with a history of seizures, depression, hyperglycemia, hypertension who presented with seizures of her left lower face that began prior to coming to the emergency department and were persistent, acute difficult for her to speak. She did take her Keppra prior to the hospital and started.   Admitted to ICU for  status epilepticus, on Ventilator. Just finished EEG, per Neurology advice. - VAP protocol - SAT/SBT daily - keppra  PE hx on heparin gtt. Watch for bleeding  HTN stable  Hx of intracranial tumor. On home AC. - would consider CT head once.   Watch for new fever, asp pneumonitis. Not on antibiotics yet.  Camera: VS stable, not on pressors. On lung protective ventilation. On heparin gtt, propofol .  Data: Reviewed CxR interstitial infiltrates, watch for progression. NG in place. Wbc normal.   eICU Interventions  As above     Intervention Category Major Interventions: Seizures - evaluation and management;Respiratory failure - evaluation and management Intermediate Interventions: Best-practice therapies (e.g. DVT, beta blocker, etc.) Evaluation Type: New Patient Evaluation  Elmer Sow 01/18/2022, 7:35 PM  22:08 UOP low, looks dry per RN.  - LR bolus ordered, call back if not better  6:09 UOP still 15 and BUN/Cr. on low end of normal. Want to give another 500cc bolus?  - re ordered LR, watch for hypoxemia, wet lung.

## 2022-01-18 NOTE — Progress Notes (Addendum)
LTM EEG initial tracings reviewed. The record is asymmetric, with diffuse bilateral slowing that is worse on the right. Also noted are frequent sharp waves on the left. There are short runs of sharply contoured waveforms centered at T8 that occur semiperiodically at about 1 Hz that are not definitively rhytmic and do not appear consistent with electrographic status, but may represent LPDs.   Patient was seen in the ED while EEG leads were being hooked up, with no clinical seizure activity seen.   Covering epileptologist has been contacted by EEG tech. Awaiting official read.   Addendum: - Official EEG report: Findings:The posterior dominant rhythm on the left hemisphere shows diffuse 4-5 hz theta slowing in the left with very severe diffuse 1-3 hz delta in the right. Frequent periodic lateralized epileptiform discharges (PLEDS) are noted in the T8 lead, at times mildly rhythmic in nature. Sleep architecture seen with sleep spindles, that are asymmetric and mainly left hemispheric.   Impression: This is an abnormal EEG due to the presence of (1) a global encephalopathy of metabolic, toxic, infectious, post-ictal etiology. (2) Superimposed frequent 1-2 second PLEDS are seen in the right temporal region indicative of strong epileptiform potential in that area. No obvious focal electrographic seizures seen or cortical spread, but would consider escalation of anti-seizure medications as the potential is strongly present. Clinical correlation is advised. - MRI brain personally reviewed: I agree with the official Radiology report findings including the following: Acutely abnormal diffusion in the lateral right perirolandic cortex, favor seizure related in this setting, with no associated enhancement or regional mass effect. Otherwise stable MRI appearance of the brain status post Avastin therapy. Tumoral enhancement and mass effect in the right hemisphere remain decreased compared to August.  - Given  arrhythmogenic potential of Vimpat, will hold off on further increase in dose.  - Will administer supplemental Keppra loading dose of 2000 mg. Increasing scheduled dose to 1500 mg IV BID.  - Will continue to monitor LTM EEG for improvement.   Addendum: - Repeat exam at the bedside (5:05 AM):  Ment: Somnolent with sonorous respirations. Will slightly open her eyes, right more than left, to vigorous stimulation of her RUE. Will give her name and date of birth when asked, then rapidly falls asleep. Verbal output is prominently dysarthric. Will squeeze examiner's hand with her right but not left hand.  CN: PERRL. No blink to threat. Eyes are near the midline without gaze deviation or nystagmus. Left facial droop. Phonation intact.  Motor/Sensory:  RUE: Will squeeze examiner's hand and move to tactile stimulation. Will move towards the left arm while it it being pinched.  LUE: Flaccid without movement to any stimuli or to command.  RLE: Withdraws to noxious stimulation after a delay LLE: Withdraws to noxious stimulation after a delay, less briskly than on the right.  Reflexes: 3+ patellar reflexes.  Cerebellar/Gait: Unable to assess Other: No jerking, twitching, facial automatisms or posturing seen.  - LTM EEG reviewed at the bedside. Still with asymmetric tracings and occasional sharply contoured waves in the right leads on a background of slowing in the right leads. Less slowing on the left without sharply contoured waves. Muscle artifact somewhat obscures the tracings.  - Continuing with current management plan as above.   Addendum: - Due to CrCl being less than 80 will decrease Keppra back down to 1000 mg IV BID - May need addition of another anticonvulsant  40 minutes spent in the neurological evaluation and management of this critically ill patient. Time  spent included bedside EEG review x 2, examination, review of MRI and coordination of care.   Electronically signed: Dr. Kerney Elbe

## 2022-01-18 NOTE — Progress Notes (Addendum)
LTM EEG hooked up and running - no initial skin breakdown - push button tested   

## 2022-01-18 NOTE — Progress Notes (Addendum)
PROGRESS NOTE    Lindsey Daniels  XNA:[id number] DOB: August 16, 1955 DOA: 01/17/2022 PCP: Harrison Mons, PA   Brief Narrative:  Lindsey Daniels is a 66 y.o. female with medical history significant of intracranial tumor, seasonal allergy, anal fissure, anxiety, depression, cataracts, elevated LFTs, hyperglycemia, hyperlipidemia, hypertension, osteoarthritis pars ephedra lumbar spine, spondylolisthesis of the lumbar spine, partial seizures who presented to the emergency department via EMS due to having a focal seizure an hour before arrival.   **Afternoon update given patient's worsening mental status and need for increased antiepileptic medications per discussion with neurology patient will be transferred to [facility] care given high likelihood for airway protection and intubation in the setting of high-dose antiepileptics.  Assessment & Plan:   Principal Problem:   Focal seizures ([place]) Active Problems:   Hyperlipidemia   HTN (hypertension)   Intracranial tumor ([place])   Pulmonary embolism (HCC)   Gastroesophageal reflux disease without esophagitis   Hypokalemia   Hypomagnesemia   Hypoglycemia  Acute metabolic encephalopathy  focal seizures ([place]) intracranial tumor [facility]) Neuro consulted, appreciate insight recommendations MRI -shows ongoing enhanced tumor and mass effect which appears somewhat improved from previous imaging in August.  Continue dexamethasone 4 mg IVP every 12 hours. Keppra 1000 mg IVP every 12 hours, Lorazepam 2 mg every 4 hours as needed for seizure. Worsening mental status over the last 24 hours, may require closer monitoring in the ICU given concern for status epilepticus and need for increasing medications per discussion with neurology.  Hypoglycemia  Transient episode in the setting of poor p.o. intake, no repeat episodes since admission Continue D5 additive to fluids   Hypokalemia Hypomagnesemia Follow-up potassium and magnesium level   Hyperlipidemia On  atorvastatin.   Depression Resume sertraline once able to tolerate p.o.   HTN (hypertension) Holding home p.o. medications given above currently well-controlled   Pulmonary embolism ([place]), POA Per CT September 25 Eliquis held at intake, not currently on anticoagulation Start therapeutic dose Lovenox   Gastroesophageal reflux disease without esophagitis On Protonix 40 mg p.o. daily - convert to IV if unable to take PO  DVT prophylaxis: Lovenox, therapeutic dose(likely resume Eliquis once able to take p.o. safely) Code Status: Full Family Communication: None present  Status is: Inpatient  Dispo: The patient is from: Home              Anticipated d/c is to: Home              Anticipated d/c date is: >72h              Patient currently not medically stable for discharge  Consultants:  Neurology, PCCM  Procedures:  EEG  Antimicrobials:  None indicated  Subjective: No acute issues or events overnight, patient's mental status continues to deteriorate much more somnolent and not interactive this morning.  Unable to follow commands or interact in any meaningful way.  Review of systems limited  Objective: Vitals:   01/18/22 0309 01/18/22 0600 01/18/22 0700 01/18/22 0730  BP:   (!) 158/97 (!) 139/90  Pulse:   90 80  Resp:   14 16  Temp: 98.3 F (36.8 C) 98.1 F (36.7 C)    TempSrc: Axillary Axillary    SpO2:   99% 97%    Intake/Output Summary (Last 24 hours) at 01/18/2022 0758 Last data filed at 01/18/2022 5427 Gross per 24 hour  Intake 1938.26 ml  Output --  Net 1938.26 ml   There were no vitals filed for this visit.  Examination:  General:  Pleasantly resting in bed, No acute distress.  Somnolent poorly arousable noninteractive  HEENT:  Normocephalic atraumatic.  Sclerae nonicteric, noninjected.  Extraocular movements intact bilaterally. Neck:  Without mass or deformity.  Trachea is midline. Lungs:  Clear to auscultate bilaterally without rhonchi, wheeze, or  rales. Heart:  Regular rate and rhythm.  Without murmurs, rubs, or gallops. Abdomen:  Soft, nontender, nondistended.  Without guarding or rebound. Extremities: Without cyanosis, clubbing, edema  Data Reviewed: I have personally reviewed following labs and imaging studies  CBC: Recent Labs  Lab 01/13/22 1359 01/17/22 1033 2022/01/20 0405  WBC 7.3 9.1 4.2  NEUTROABS 5.3 6.6  --   HGB 15.6* 14.1 10.6*  HCT 47.7* 44.5 31.4*  MCV 90.0 92.3 90.2  PLT 277 245 157   Basic Metabolic Panel: Recent Labs  Lab 01/13/22 1359 01/17/22 1413 01/17/22 1415 January 20, 2022 0405  NA 139  --  142 UNSATISFACTORY SPECIMEN. CLIENT REQUESTS SPECIMEN TO BE ANALYZED.  K 3.3*  --  2.3* UNSATISFACTORY SPECIMEN. CLIENT REQUESTS SPECIMEN TO BE ANALYZED.  CL 104  --  104 UNSATISFACTORY SPECIMEN. CLIENT REQUESTS SPECIMEN TO BE ANALYZED.  CO2 17*  --  27 UNSATISFACTORY SPECIMEN. CLIENT REQUESTS SPECIMEN TO BE ANALYZED.  GLUCOSE 142*  --  106* UNSATISFACTORY SPECIMEN. CLIENT REQUESTS SPECIMEN TO BE ANALYZED.  BUN 6*  --  13 UNSATISFACTORY SPECIMEN. CLIENT REQUESTS SPECIMEN TO BE ANALYZED.  CREATININE 0.68  --  0.49 UNSATISFACTORY SPECIMEN. CLIENT REQUESTS SPECIMEN TO BE ANALYZED.  CALCIUM 9.5  --  9.4 UNSATISFACTORY SPECIMEN. CLIENT REQUESTS SPECIMEN TO BE ANALYZED.  MG  --  1.6*  --   --    GFR: CrCl cannot be calculated (This lab value cannot be used to calculate CrCl because it is not a number: UNSATISFACTORY SPECIMEN. CLIENT REQUESTS SPECIMEN TO BE ANALYZED.). Liver Function Tests: Recent Labs  Lab 01/13/22 1359 01/17/22 1415 Jan 20, 2022 0405  AST 41 32 UNSATISFACTORY SPECIMEN. CLIENT REQUESTS SPECIMEN TO BE ANALYZED.  ALT 24 20 UNSATISFACTORY SPECIMEN. CLIENT REQUESTS SPECIMEN TO BE ANALYZED.  ALKPHOS 59 52 UNSATISFACTORY SPECIMEN. CLIENT REQUESTS SPECIMEN TO BE ANALYZED.  BILITOT 1.9* 0.8 UNSATISFACTORY SPECIMEN. CLIENT REQUESTS SPECIMEN TO BE ANALYZED.  PROT 6.9 6.9 UNSATISFACTORY SPECIMEN. CLIENT REQUESTS  SPECIMEN TO BE ANALYZED.  ALBUMIN 3.9 3.7 UNSATISFACTORY SPECIMEN. CLIENT REQUESTS SPECIMEN TO BE ANALYZED.   No results for input(s): "LIPASE", "AMYLASE" in the last 168 hours. No results for input(s): "AMMONIA" in the last 168 hours. Coagulation Profile: No results for input(s): "INR", "PROTIME" in the last 168 hours. Cardiac Enzymes: Recent Labs  Lab 01/17/22 1413  CKTOTAL 311*   BNP (last 3 results) No results for input(s): "PROBNP" in the last 8760 hours. HbA1C: No results for input(s): "HGBA1C" in the last 72 hours. CBG: Recent Labs  Lab 01/17/22 2033 01/17/22 2238 01/17/22 2348 01-20-2022 0147 20-Jan-2022 0523  GLUCAP 95 141* 157* 165* 127*   Lipid Profile: No results for input(s): "CHOL", "HDL", "LDLCALC", "TRIG", "CHOLHDL", "LDLDIRECT" in the last 72 hours. Thyroid Function Tests: No results for input(s): "TSH", "T4TOTAL", "FREET4", "T3FREE", "THYROIDAB" in the last 72 hours. Anemia Panel: No results for input(s): "VITAMINB12", "FOLATE", "FERRITIN", "TIBC", "IRON", "RETICCTPCT" in the last 72 hours. Sepsis Labs: Recent Labs  Lab 01/17/22 1047 01/17/22 1411  LATICACIDVEN 2.3* 1.5    No results found for this or any previous visit (from the past 240 hour(s)).       Radiology Studies: EEG adult  Result Date: 01/20/2022 Kennon Portela, MD     2022-01-20  3:41 AM [facility] Stat EEG Report Demographics: Patient Name:   Lindsey Daniels, Lindsey Daniels Date of Birth:   August 24, 1955 Identification Number:   MRN - [id number] Study Times: Study Start Time:   01/17/2022 23:20:21 Study End Time:   01/18/2022 00:52:54 Indication(s): Seizures Technical Summary: This EEG was performed utilizing standard International 10-20 System of electrode placement. One channel electrocardiogram was monitored. Data were obtained, stored, and interpreted utilizing referential montage recording, with reformatting to longitudinal, transverse bipolar, and  referential montages as necessary for interpretation. State(s):       Awake      Drowsy      Asleep Activation Procedures: Photic Stimulation: EEG Description: The posterior dominant rhythm on the left hemisphere shows diffuse 4-5 hz theta slowing in the left with very severe diffuse 1-3 hz delta in the right. Frequent periodic lateralized epileptiform discharges (PLEDS) are noted in the T8 lead, at times mildly rhythmic in nature. Sleep architecture seen with sleep spindles, that are asymmetric and mainly left hemispheric. Impression: This is an abnormal EEG due to the presence of (1) a global encephalopathy of metabolic, toxic, infectious, post-ictal etiology. (2) Superimposed frequent 1-2 second PLEDS are seen in the right temporal region indicative of strong epileptiform potential in that area. No obvious focal electrographic seizures seen or cortical spread, but would consider escalation of anti-seizure medications as the potential is strongly present. Clinical correlation is advised. Dr Kennon Portela [facility] For Inpatient follow-up with [facility] physician please call [facility] [phone]. This is not an outpatient service. Post hospital discharge, please contact hospital directly.   MR BRAIN W WO CONTRAST  Result Date: 01/17/2022 CLINICAL DATA:  66 year old female with seizure like activity. Left side face and extremity twitching. History of treated PXA with IMRT January in February this year. Progressive tumoral edema and intracranial mass effect in August, subsequent Avastin therapy completed 12/16/2021. EXAM: MRI HEAD WITHOUT AND WITH CONTRAST TECHNIQUE: Multiplanar, multiecho pulse sequences of the brain and surrounding structures were obtained without and with intravenous contrast. CONTRAST:  7.63m GADAVIST GADOBUTROL 1 MMOL/ML IV SOLN COMPARISON:  Restaging MRI 12/24/2021 and earlier. FINDINGS: Brain: Study is intermittently degraded by motion artifact despite repeated imaging  attempts. Conspicuous new perirolandic gyral diffusion abnormality along the right lateral central sulcus (series 5, image 37), does appear restricted on ADC. But no significant cortical enlargement, and little edema the on the affected cortex. Underlying ongoing right hemisphere T2 and FLAIR hyperintensity compatible with tumoral edema which involves the deep white matter capsules, dorsal right thalamus as before. T2 shine through in those areas. Posterior right temporal lobe resection cavity with stable marginal and regional enhancement since 12/24/2021, substantially decreased compared to August. No increasing mass effect, and patency of the right lateral ventricle has mildly increased since August. No midline shift. No ventriculomegaly. No acute intracranial hemorrhage. Negative pituitary and cervicomedullary junction. other diffusion abnormality or other new signal abnormality. Vascular: Major intracranial vascular flow voids are stable. Major dural venous sinuses appear to be enhancing as expected. Skull and upper cervical spine: Negative. Sinuses/Orbits: Negative. Other: Mastoids remain well aerated.  Stable visible scalp and face. IMPRESSION: 1. Mildly motion degraded exam today. 2. Acutely abnormal diffusion in the lateral right perirolandic cortex, favor Seizure related in this setting, with no associated enhancement or regional mass effect. 3. Otherwise stable MRI appearance of the brain status post are Avastin therapy. Tumoral enhancement and mass effect in the right hemisphere remain decreased compared to August. Electronically Signed   By: HGenevie Ann  M.D.   On: 01/17/2022 15:21   DG Chest Portable 1 View  Result Date: 01/17/2022 CLINICAL DATA:  Seizure EXAM: PORTABLE CHEST 1 VIEW COMPARISON:  09/22/2021 FINDINGS: Stable cardiomediastinal contours. No focal airspace consolidation, pleural effusion, or pneumothorax. IMPRESSION: No active disease. Electronically Signed   By: Davina Poke D.O.   On:  01/17/2022 14:19    Scheduled Meds:  dexamethasone (DECADRON) injection  4 mg Intravenous Q12H   Continuous Infusions:  dextrose 5 % and 0.9 % NaCl with KCl 40 mEq/L 125 mL/hr at 01/18/22 0742   lacosamide (VIMPAT) IV Stopped (01/17/22 2206)   levETIRAcetam       LOS: 0 days   Time spent: 55mn  Haru Shaff C Cailean Heacock, DO [facility]  If 7PM-7AM, please contact night-coverage [email or web address]  01/18/2022, 7:58 AM

## 2022-01-18 NOTE — ED Notes (Signed)
Pt responds to voice when loud and repeated multiple times. Pt able to follow commands when repeated. Left arm flaccid and left grip strength poor compared to right side.

## 2022-01-18 NOTE — Procedures (Signed)
Intubation Procedure Note  Neiva Maenza  [id number]  [birth year]  Date:01/18/22  Time:12:43 PM   Provider Performing:Lelania Bia Naomie Dean    Procedure: Intubation (62952)  Indication(s) Respiratory Failure  Consent Unable to obtain consent due to emergent nature of procedure.   Anesthesia Etomidate, Fentanyl, and Rocuronium   Time Out Verified patient identification, verified procedure, site/side was marked, verified correct patient position, special equipment/implants available, medications/allergies/relevant history reviewed, required imaging and test results available.   Sterile Technique Usual hand hygeine, masks, and gloves were used   Procedure Description Patient positioned in bed supine.  Sedation given as noted above.  Patient was intubated with endotracheal tube using Glidescope S3.  View was Grade 1 full glottis .  Number of attempts was 1.  Colorimetric CO2 detector was consistent with tracheal placement. Crowded mouth, poor mouth opening, anterior. Grade 1 view with glidescope, needs cric pressure.    Complications/Tolerance None; patient tolerated the procedure well. Chest X-ray is ordered to verify placement.   EBL Minimal   Specimen(s) None  Julian Hy, DO 01/18/22 12:44 PM [facility]

## 2022-01-18 NOTE — Progress Notes (Signed)
Appreciate CCM assistance with intubation and initiation of versed and propofol.   Will start Onfi 5 mg once for a small load followed by 5 mg nightly as this will have minimal interactions with her long term regimen Will discontinue vimpat due to her known LBBB limited rapid titration   Pending clinical course may start Ativan taper to bridge to [place] reaching a steady state.   Neurology will continue to follow  [place] [phone]

## 2022-01-18 NOTE — ED Notes (Signed)
RN unable to locate seizure pads. Blankets to be placed on bed rails for protection. Charge aware.

## 2022-01-18 NOTE — Procedures (Signed)
[facility]  Stat EEG Report   Demographics: Patient Name:   Lindsey Daniels, Lindsey Daniels  Date of Birth:   [birth year]  Identification Number:   MRN - [id number]  Study Times:  Study Start Time:   01/17/2022 23:20:21 Study End Time:   01/18/2022 00:52:54  Indication(s): Seizures  Technical Summary:  This EEG was performed utilizing standard International 10-20 System of electrode placement. One channel electrocardiogram was monitored. Data were obtained, stored, and interpreted utilizing referential montage recording, with reformatting to longitudinal, transverse bipolar, and referential montages as necessary for interpretation.  State(s):       Awake      Drowsy      Asleep   Activation Procedures: Photic Stimulation:   EEG Description:  The posterior dominant rhythm on the left hemisphere shows diffuse 4-5 hz theta slowing in the left with very severe diffuse 1-3 hz delta in the right. Frequent periodic lateralized epileptiform discharges (PLEDS) are noted in the T8 lead, at times mildly rhythmic in nature. Sleep architecture seen with sleep spindles, that are asymmetric and mainly left hemispheric.  Impression:  This is an abnormal EEG due to the presence of (1) a global encephalopathy of metabolic, toxic, infectious, post-ictal etiology. (2) Superimposed frequent 1-2 second PLEDS are seen in the right temporal region indicative of strong epileptiform potential in that area. No obvious focal electrographic seizures seen or cortical spread, but would consider escalation of anti-seizure medications as the potential is strongly present. Clinical correlation is advised.    Dr Kennon Portela      [facility] For Inpatient follow-up with [facility] physician please call [facility] [phone]. This is not an outpatient service. Post hospital discharge, please contact hospital directly.

## 2022-01-18 NOTE — Progress Notes (Signed)
Transported from ED to 4N without complications

## 2022-01-18 NOTE — Progress Notes (Addendum)
EEG complete - results pending 

## 2022-01-18 NOTE — Progress Notes (Signed)
Transported patient to C.T while patient was on the ventilator. Patient oxygen was placed on 100% while being transported. Patient remained stable during transport.

## 2022-01-18 NOTE — ED Notes (Signed)
Son would like to be updated when admitted to floor room.

## 2022-01-18 NOTE — Progress Notes (Signed)
ANTICOAGULATION CONSULT NOTE - Initial Consult  Pharmacy Consult for Heparin infusion Indication: history of pulmonary embolus (11/10/21)  Allergies  Allergen Reactions   Rifapentine Other (See Comments)    Flu-like symptoms   Amoxicillin Rash   Clavulanic Acid Rash    Patient Measurements: Height: '5\' 3"'$  (160 cm) Weight: 74 kg (163 lb 1.6 oz) IBW/kg (Calculated) : 52.4 Heparin Dosing Weight: 68 kg  Vital Signs: Temp: 97.5 F (36.4 C) (12/03 1415) Temp Source: Axillary (12/03 1003) BP: 166/124 (12/03 1415) Pulse Rate: 116 (12/03 1415)  Labs: Recent Labs    01/17/22 1033 01/17/22 1413 01/17/22 1415 01/18/22 0405 01/18/22 0720 01/18/22 1413  HGB 14.1  --   --  10.6*  --  12.9  HCT 44.5  --   --  31.4*  --  38.0  PLT 245  --   --  174  --   --   CREATININE  --   --  0.49 UNSATISFACTORY SPECIMEN. CLIENT REQUESTS SPECIMEN TO BE ANALYZED. 0.47  --   CKTOTAL  --  311*  --   --   --   --     Estimated Creatinine Clearance: 65.7 mL/min (by C-G formula based on SCr of 0.47 mg/dL).   Medical History: Past Medical History:  Diagnosis Date   Allergy    Anal fissure    Anxiety    Cataract    Depression    Elevated LFTs    Hyperglycemia    Hyperlipidemia    Hypertension    Osteoarthritis    Pars defect of lumbar spine    Seizures (HCC)    Spondylolisthesis    lumbar    Medications:  (Not in a hospital admission)   Assessment: 66 yo F presents with concerns for focal seizure activity despite compliance with home antiepileptic medications. Given AMS, pt has been unable to take PO meds.   Goal of Therapy:  Heparin level 0.3-0.7 units/ml aPTT 66-102 seconds Monitor platelets by anticoagulation protocol: Yes   Plan:  Start heparin infusion at 1000 units/hr No heparin bolus, per CCM team Check aPTT and heparin level in 6 hours and repeat until heparin level correlating with aPTT given recent apixaban administration Monitor daily CBC, aPTT and heparin levels,  and for s/sx of bleeding  Luisa Hart, PharmD, BCPS Clinical Pharmacist 01/18/2022 2:50 PM   Please refer to AMION for pharmacy phone number

## 2022-01-19 DIAGNOSIS — R569 Unspecified convulsions: Secondary | ICD-10-CM | POA: Diagnosis not present

## 2022-01-19 DIAGNOSIS — C719 Malignant neoplasm of brain, unspecified: Secondary | ICD-10-CM

## 2022-01-19 DIAGNOSIS — G40901 Epilepsy, unspecified, not intractable, with status epilepticus: Secondary | ICD-10-CM | POA: Diagnosis not present

## 2022-01-19 DIAGNOSIS — R34 Anuria and oliguria: Secondary | ICD-10-CM

## 2022-01-19 LAB — BASIC METABOLIC PANEL
Anion gap: 9 (ref 5–15)
BUN: 7 mg/dL — ABNORMAL LOW (ref 8–23)
CO2: 25 mmol/L (ref 22–32)
Calcium: 8.8 mg/dL — ABNORMAL LOW (ref 8.9–10.3)
Chloride: 108 mmol/L (ref 98–111)
Creatinine, Ser: 0.47 mg/dL (ref 0.44–1.00)
GFR, Estimated: >60 mL/min (ref >60)
Glucose, Bld: 139 mg/dL — ABNORMAL HIGH (ref 70–99)
Potassium: 3.5 mmol/L (ref 3.5–5.1)
Sodium: 142 mmol/L (ref 135–145)

## 2022-01-19 LAB — GLUCOSE, CAPILLARY
Glucose-Capillary: 136 mg/dL — ABNORMAL HIGH (ref 70–99)
Glucose-Capillary: 126 mg/dL — ABNORMAL HIGH (ref 70–99)
Glucose-Capillary: 100 mg/dL — ABNORMAL HIGH (ref 70–99)
Glucose-Capillary: 176 mg/dL — ABNORMAL HIGH (ref 70–99)
Glucose-Capillary: 158 mg/dL — ABNORMAL HIGH (ref 70–99)
Glucose-Capillary: 197 mg/dL — ABNORMAL HIGH (ref 70–99)

## 2022-01-19 LAB — APTT
aPTT: 154 seconds — ABNORMAL HIGH (ref 24–36)
aPTT: 149 seconds — ABNORMAL HIGH (ref 24–36)
aPTT: 107 seconds — ABNORMAL HIGH (ref 24–36)

## 2022-01-19 LAB — MAGNESIUM: Magnesium: 1.7 mg/dL (ref 1.7–2.4)

## 2022-01-19 LAB — TRIGLYCERIDES: Triglycerides: 126 mg/dL (ref <150)

## 2022-01-19 LAB — PHOSPHORUS: Phosphorus: 3.4 mg/dL (ref 2.5–4.6)

## 2022-01-19 LAB — HEPARIN LEVEL (UNFRACTIONATED): Heparin Unfractionated: >1.1 IU/mL — ABNORMAL HIGH (ref 0.30–0.70)

## 2022-01-19 MED ORDER — LACTATED RINGERS IV SOLN: INTRAVENOUS | Status: DC

## 2022-01-19 MED ORDER — CLOBAZAM 10 MG PO TABS
5.0000 mg | ORAL_TABLET | Freq: Two times a day (BID) | ORAL | Status: DC
  Administered 2022-01-19 – 2022-01-21 (×5): 5 mg
  Filled 2022-01-19 (×5): qty 1

## 2022-01-19 MED ORDER — HEPARIN (PORCINE) 25000 UT/250ML-% IV SOLN
850.0000 [IU]/h | INTRAVENOUS | Status: DC
  Administered 2022-01-19: 750 [IU]/h via INTRAVENOUS
  Administered 2022-01-19: 700 [IU]/h via INTRAVENOUS
  Filled 2022-01-19 (×2): qty 250

## 2022-01-19 MED ORDER — VITAL 1.5 CAL PO LIQD
1000.0000 mL | ORAL | Status: DC
  Administered 2022-01-19 – 2022-01-21 (×3): 1000 mL

## 2022-01-19 MED ORDER — PROSOURCE TF20 ENFIT COMPATIBL EN LIQD
60.0000 mL | Freq: Two times a day (BID) | ENTERAL | Status: DC
  Administered 2022-01-19 – 2022-01-22 (×6): 60 mL
  Filled 2022-01-19 (×6): qty 60

## 2022-01-19 MED ORDER — LACTATED RINGERS IV BOLUS
500.0000 mL | Freq: Once | INTRAVENOUS | Status: AC
  Administered 2022-01-19: 500 mL via INTRAVENOUS

## 2022-01-19 MED ORDER — LACTATED RINGERS IV BOLUS
1000.0000 mL | Freq: Once | INTRAVENOUS | Status: AC
  Administered 2022-01-19: 1000 mL via INTRAVENOUS

## 2022-01-19 MED ORDER — MAGNESIUM SULFATE 2 GM/50ML IV SOLN
2.0000 g | Freq: Once | INTRAVENOUS | Status: AC
  Administered 2022-01-19: 2 g via INTRAVENOUS
  Filled 2022-01-19: qty 50

## 2022-01-19 MED ORDER — POTASSIUM CHLORIDE 20 MEQ PO PACK
60.0000 meq | PACK | Freq: Once | ORAL | Status: AC
  Administered 2022-01-19: 60 meq
  Filled 2022-01-19: qty 3

## 2022-01-19 NOTE — Progress Notes (Addendum)
ANTICOAGULATION CONSULT NOTE - Follow Up Consult  Pharmacy Consult for Heparin Dosing Indication:  history of PE (11/10/21)  Allergies  Allergen Reactions   Rifapentine Other (See Comments)    Flu-like symptoms   Amoxicillin Rash   Clavulanic Acid Rash    Patient Measurements: Height: '5\' 3"'$  (160 cm) Weight: 75 kg (165 lb 5.5 oz) IBW/kg (Calculated) : 52.4 Heparin Dosing Weight: 68 kg  Vital Signs: Temp: 98.2 F (36.8 C) (12/04 0800) Temp Source: Bladder (12/04 0700) BP: 124/82 (12/04 0832) Pulse Rate: 78 (12/04 0832)  Labs: Recent Labs    01/17/22 1033 01/17/22 1413 01/17/22 1415 01/18/22 0405 01/18/22 0720 01/18/22 1413 01/19/22 0456 01/19/22 0658  HGB 14.1  --   --  10.6*  --  12.9  --   --   HCT 44.5  --   --  31.4*  --  38.0  --   --   PLT 245  --   --  174  --   --   --   --   APTT  --   --   --   --   --   --  154* 149*  HEPARINUNFRC  --   --   --   --   --   --   --  >1.10*  CREATININE  --   --    < > UNSATISFACTORY SPECIMEN. CLIENT REQUESTS SPECIMEN TO BE ANALYZED. 0.47  --  0.47  --   CKTOTAL  --  311*  --   --   --   --   --   --    < > = values in this interval not displayed.    Estimated Creatinine Clearance: 66.1 mL/min (by C-G formula based on SCr of 0.47 mg/dL).   Medications:  Heparin 1000 units/hr infusion  Assessment: 74 YOF presented w concern for focal seizure activity despite compliance with home antiepileptic medications. Pt is on Eliquis 5 mg BID at home for hx of PE. Pharmacy consulted to transition patient to IV heparin.  Currently using aPTT to guide heparin dosing since DOAC can falsely elevate heparin levels  APTT level drawn given recent Eliquis administration and found to be supratherapeutic (154) today at 0456. Concern lab was drawn incorrectly, so aPTT redrawn at 0658 (149). Heparin level also supratherapeutic (>1.1) at this time. No s/sx of bleeding.   Goal of Therapy:  Heparin level 0.3-0.7 units/ml Monitor platelets by  anticoagulation protocol: Yes   Plan:  Heparin stopped at 0900 for 1 hour. Restart heparin infusion at 750 units/hr. Check aPTT in 6 hours  Monitor daily CBC, aPTT and heparin levels, and s/sx for bleeding.   Staphanie Zhang 01/19/2022,9:08 AM  Remigio Eisenmenger D. Mina Marble, PharmD, BCPS, [place] 01/19/2022, 9:52 AM

## 2022-01-19 NOTE — Progress Notes (Signed)
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  It sounds like she has had progressive decline at home related to her seizures. Lateral to [facility] ED.  She was admitted to [facility] with neuro consult. Keppra loaded. MRI brain had an acute change of abnormal diffusion in lateral R perirolandic cortex. cEEG with focal epileptogenicity.    On 12/3 despite escalation of AEDs, still having evidence of focal seizures. Pt remains very somnolent. With need for AED escalation and pts current mental status, PCCM is consulted for elective intubation.    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 Keppra loaded., neuro consulted admit to [facility] 12/3 need for AED escalation. Mentation concerning for airway protection in CNS depressing meds increased. PCCM consulted for elective intubation.  12/4 further uptitration of AEDs. Remaining intubated. Adding mIVF   Interim History / Subjective:   Ongoing focal sz Did not pause sedation for WUA in this setting   Low UOP   Objective   Blood pressure 129/86, pulse 78, temperature 98.8 F (37.1 C), resp. rate 17, height '5\' 3"'$  (1.6 m), weight 75 kg, SpO2 100 %.    Vent Mode: PRVC FiO2 (%):  [30 %-60 %] 40 % Set Rate:  [15 bmp-17 bmp] 17 bmp Vt Set:  [420 mL] 420 mL PEEP:  [5 cmH20] 5 cmH20 Plateau Pressure:  [9 cmH20-17 cmH20] 15 cmH20   Intake/Output Summary (Last 24 hours) at 01/19/2022 1105 Last  data filed at 01/19/2022 0800 Gross per 24 hour  Intake 1434.39 ml  Output 545 ml  Net 889.39 ml   Filed Weights   01/18/22 0934 01/18/22 1857 01/19/22 0500  Weight: 74 kg 72.9 kg 75 kg    Examination: General:  critically and chronically ill older adult intubated sedated  HENT: NCAT. EEG. ETT. OGT  Lungs: CTAb mechanically ventilated  Cardiovascular: rrr s1s2 no rgm  Abdomen: soft ndnt + bowel sounds  Extremities: bruising over L shoulder and L upper arm. Does not appear to have an obvious joint abnormality   Neuro:  sedated.   GU: foley   Resolved Hospital Problem list     Assessment & Plan:   Focal status epilepticus Hx seizure disorder Acte encephalopathy in setting of above Hx pleomorphic xanthoastocytoma s/p crani (2015, 2022) and XRT  -still w evidence of focal sz 12/4  P -appreciate neuro recs and expert guidance on AED management. Onfi was added 12/3 & is being up titrated 12/4. On keppra  -cont cEEG -cont decadron  -on prop (and PRN fent) for PAD -PRN BZD available  Acute respiratory failure / need for mechanical ventilation for airway protection in setting of SE  P -Cont MV support, wean as able   -lung protective ventilation -PRN CXR -VAP, pulm hygiene   Hypomagnesemia Hypokalemia -give K and Mag again -cont to follow   HTN HLD -atorvastatin -holding home antihypertensives. With sedation needed for ETT her BP  is currently appropriate without additional agents. Will add if needed  Hx recent PE on eliquis  -hep gtt per pharmD  Oliguria -keep foley -will bolus LR and add mIVF  -if no pickup in  UOP will give a small dose of lasix   Inadequate PO intake  -EN when kangaroo available. D/w RN -- if CBG <100 please let me know and I will add dextrose containing fluids   Best Practice (right click and "Reselect all SmartList Selections" daily)   Diet/type: tubefeeds and NPO DVT prophylaxis: systemic heparin GI prophylaxis: PPI Lines:  N/A Foley:  N/A Code Status:  limited Last date of multidisciplinary goals of care discussion [12/3 neuro d/w family prior to ICU admission] Neuro discussed code status and elective intubation: temporary intubation for seizure control but she would not want CPR or extended life supports   Labs   CBC: Recent Labs  Lab 01/13/22 1359 01/17/22 1033 01/18/22 0405 01/18/22 1413  WBC 7.3 9.1 4.2  --   NEUTROABS 5.3 6.6  --   --   HGB 15.6* 14.1 10.6* 12.9  HCT 47.7* 44.5 31.4* 38.0  MCV 90.0 92.3 90.2  --   PLT 277 245 174  --     Basic Metabolic Panel: Recent Labs  Lab 01/13/22 1359 01/17/22 1413 01/17/22 1415 01/18/22 0405 01/18/22 0720 01/18/22 1303 01/18/22 1413 01/19/22 0456  NA 139  --  142 UNSATISFACTORY SPECIMEN. CLIENT REQUESTS SPECIMEN TO BE ANALYZED. 141  --  141 142  K 3.3*  --  2.3* UNSATISFACTORY SPECIMEN. CLIENT REQUESTS SPECIMEN TO BE ANALYZED. 3.7  --  3.6 3.5  CL 104  --  104 UNSATISFACTORY SPECIMEN. CLIENT REQUESTS SPECIMEN TO BE ANALYZED. 112*  --   --  108  CO2 17*  --  27 UNSATISFACTORY SPECIMEN. CLIENT REQUESTS SPECIMEN TO BE ANALYZED. 22  --   --  25  GLUCOSE 142*  --  106* UNSATISFACTORY SPECIMEN. CLIENT REQUESTS SPECIMEN TO BE ANALYZED. 128*  --   --  139*  BUN 6*  --  13 UNSATISFACTORY SPECIMEN. CLIENT REQUESTS SPECIMEN TO BE ANALYZED. 5*  --   --  7*  CREATININE 0.68  --  0.49 UNSATISFACTORY SPECIMEN. CLIENT REQUESTS SPECIMEN TO BE ANALYZED. 0.47  --   --  0.47  CALCIUM 9.5  --  9.4 UNSATISFACTORY SPECIMEN. CLIENT REQUESTS SPECIMEN TO BE ANALYZED. 8.2*  --   --  8.8*  MG  --  1.6*  --   --   --  1.8  --  1.7  PHOS  --   --   --   --   --   --   --  3.4   GFR: Estimated Creatinine Clearance: 66.1 mL/min (by C-G formula based on SCr of 0.47 mg/dL). Recent Labs  Lab 01/13/22 1359 01/17/22 1033 01/17/22 1047 01/17/22 1411 01/18/22 0405  WBC 7.3 9.1  --   --  4.2  LATICACIDVEN  --   --  2.3* 1.5  --     Liver Function Tests: Recent Labs  Lab  01/13/22 1359 01/17/22 1415 01/18/22 0405 01/18/22 0720  AST 41 32 UNSATISFACTORY SPECIMEN. CLIENT REQUESTS SPECIMEN TO BE ANALYZED. 29  ALT 24 20 UNSATISFACTORY SPECIMEN. CLIENT REQUESTS SPECIMEN TO BE ANALYZED. [place] CLIENT REQUESTS SPECIMEN TO BE ANALYZED. 39  BILITOT 1.9* 0.8 UNSATISFACTORY SPECIMEN. CLIENT REQUESTS SPECIMEN TO BE ANALYZED. 0.5  PROT 6.9 6.9 UNSATISFACTORY SPECIMEN. CLIENT REQUESTS SPECIMEN TO BE ANALYZED. 5.2*  ALBUMIN  3.9 3.7 UNSATISFACTORY SPECIMEN. CLIENT REQUESTS SPECIMEN TO BE ANALYZED. 3.1*   No results for input(s): "LIPASE", "AMYLASE" in the last 168 hours. No results for input(s): "AMMONIA" in the last 168 hours.  ABG    Component Value Date/Time   PHART 7.313 (L) 01/18/2022 1413   PCO2ART 45.5 01/18/2022 1413   PO2ART 561 (H) 01/18/2022 1413   HCO3 23.1 01/18/2022 1413   TCO2 24 01/18/2022 1413   ACIDBASEDEF 3.0 (H) 01/18/2022 1413   O2SAT 100 01/18/2022 1413     Coagulation Profile: No results for input(s): "INR", "PROTIME" in the last 168 hours.  Cardiac Enzymes: Recent Labs  Lab 01/17/22 1413  CKTOTAL 311*    HbA1C: Hemoglobin A1C  Date/Time Value Ref Range Status  06/07/2015 08:30 AM 5.5  Final   Hgb A1c MFr Bld  Date/Time Value Ref Range Status  06/30/2017 06:15 PM 5.4 4.8 - 5.6 % Final    Comment:             Prediabetes: 5.7 - 6.4          Diabetes: >6.4          Glycemic control for adults with diabetes: <7.0     CBG: Recent Labs  Lab 01/18/22 2153 01/18/22 2304 01/19/22 0315 01/19/22 0746 01/19/22 1100  GLUCAP 116* 128* 136* 126* 100*   CRITICAL CARE Performed by: Cristal Generous   Total critical care time: 36 minutes  Critical care time was exclusive of separately billable procedures and treating other patients. Critical care was necessary to treat or prevent imminent or life-threatening deterioration.  Critical care was time spent personally by me on the following  activities: development of treatment plan with patient and/or surrogate as well as nursing, discussions with consultants, evaluation of patient's response to treatment, examination of patient, obtaining history from patient or surrogate, ordering and performing treatments and interventions, ordering and review of laboratory studies, ordering and review of radiographic studies, pulse oximetry and re-evaluation of patient's condition.  Eliseo Gum MSN, AGACNP-BC [place] for pager 01/19/2022, 11:05 AM

## 2022-01-19 NOTE — Procedures (Signed)
EEG Procedure CPT/Type of Study: 70263; 24hr EEG with video Referring Provider: Avon Gully Primary Neurological Diagnosis: seizures   History: This is a 66 yr old patient, undergoing an EEG to evaluate for seizures, status epilepticus. Clinical State: disoriented   Technical Description:   The EEG was performed using standard setting per the guidelines of American Clinical Neurophysiology Society (ACNS).   A minimum of 21 electrodes were placed on scalp according to the International 10-20 or/and 10-10 Systems. Supplemental electrodes were placed as needed. Single EKG electrode was also used to detect cardiac arrhythmia. Patient's behavior was continuously recorded on video simultaneously with EEG. A minimum of 16 channels were used for data display. Each epoch of study was reviewed manually daily and as needed using standard referential and bipolar montages. Computerized quantitative EEG analysis (such as compressed spectral array analysis, trending, automated spike & seizure detection) were used as indicated.    Day 2: from 0730 01/18/22 to 0730 01/19/22   EEG Description: Overall Amplitude:Normal Predominant Frequency: The background activity showed theta and some delta, 3-'6Hz'$ , during the initial portion of the record with loss of background as sedation was increased.  Superimposed Frequencies: sparse beta activity, decreased on the right The background was asymmetric, decreased fast on the right   Background Abnormalities: focal slowing, right centro-temporal focal delta slowing Rhythmic or periodic pattern: Yes; LPDs in the right centro-temporal region occurring frequently, 1-'2Hz'$  frequency with overriding fast activity Epileptiform activity: Yes; right central spiky morphology without clear ictal evolution Electrographic seizures: No Events: no    Breach rhythm: no   Reactivity: Present   Stimulation procedures:  Hyperventilation: not done Photic stimulation: no change   Sleep  Background: Stage II   EKG: irregular rhythm   Impression: This was an abnormal continuous video EEG due to right central spiky LPDs and some loss of background with periods of suppression as sedation was increased. No clear ictal evolution was seen with the LPD pattern. This was consistent with a focal epileptogenic disturbance on the right.

## 2022-01-19 NOTE — Progress Notes (Signed)
Neurology Progress Note   Subjective: - patient intubated - wakeup exam not performed today 2/2 persistent R LPDs  EEG 12/3-12/4: persistent R LPDs, intermittent suppression 2/2 medication effect, no evolution to seizures EEG 12/2-12/3: right central spiky LPDs with subtle ictal evolution, indicative of ongoing focal epileptogenicity in that region.    Exam: Current vital signs: BP 113/76   Pulse 89   Temp 99 F (37.2 C)   Resp 18   Ht '5\' 3"'$  (1.6 m)   Wt 75 kg   SpO2 99%   BMI 29.29 kg/m  Vital signs in last 24 hours: Temp:  [97.7 F (36.5 C)-99.1 F (37.3 C)] 99 F (37.2 C) (12/04 1800) Pulse Rate:  [62-96] 89 (12/04 1800) Resp:  [0-22] 18 (12/04 1800) BP: (107-157)/(71-116) 113/76 (12/04 1800) SpO2:  [98 %-100 %] 99 % (12/04 1800) FiO2 (%):  [30 %-40 %] 40 % (12/04 1521) Weight:  [75 kg] 75 kg (12/04 0500)  Exam on propofol and versed see above  Gen: In bed, intubated, sedated Resp: ventilated Cardiac: Perfusing extremities well  Abd: soft, nt  Neuro: MS: no response to noxious stimuli on sedation, does not follow commands CN: pupils 64m ERRL sluggish, (-) corneals, oculocephalics, cough, gag Motor & sensory: no response to noxious stimuli  Pertinent Labs:  Basic Metabolic Panel: Recent Labs  Lab 01/13/22 1359 01/17/22 1413 01/17/22 1415 01/18/22 0720  NA 139  --  142 141  K 3.3*  --  2.3* 3.7  CL 104  --  104 112*  CO2 17*  --  27 22  GLUCOSE 142*  --  106* 128*  BUN 6*  --  13 5*  CREATININE 0.68  --  0.49 0.47  CALCIUM 9.5  --  9.4 8.2*  MG  --  1.6*  --   --     CBC: Recent Labs  Lab 01/13/22 1359 01/17/22 1033 01/18/22 0405 01/18/22 1413  WBC 7.3 9.1 4.2  --   NEUTROABS 5.3 6.6  --   --   HGB 15.6* 14.1 10.6* 12.9  HCT 47.7* 44.5 31.4* 38.0  MCV 90.0 92.3 90.2  --   PLT 277 245 174  --      Coagulation Studies: No results for input(s): "LABPROT", "INR" in the last 72 hours.    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 Status resolved after intubation and sedation with propofol and versed although she still has R LPDs. Vimpat d/c'd 2/2 LBBB on EKG.  Recommendations: - Continue long-term EEG monitoring - Continue keppra 1000 mg BID (renal dosing) - Increase onfi to '5mg'$  bid - Intubation w/ sedation with propofol and versed, continue sedation overnight - Neurology will continue to follow  Estimated Creatinine Clearance: 66.1 mL/min (by C-G formula based on SCr of 0.47 mg/dL).   CrCl 80 to 130 mL/minute/1.73 m2: 500 mg to 1.5 g every 12 hours.  CrCl 50 to <80 mL/minute/1.73 m2: 500 mg to 1 g every 12 hours.  CrCl 30 to <50 mL/minute/1.73 m2: 250 to 750 mg every 12 hours.  CrCl 15 to <30 mL/minute/1.73 m2: 250 to 500 mg every 12 hours.  CrCl <15 mL/minute/1.73 m2: 250 to 500 mg every 24 hours (expert opinion).  D/w brother CFaythe Dingwallby phone  This patient is critically ill and at significant risk of neurological worsening, death and care requires constant monitoring of vital signs, hemodynamics,respiratory and  cardiac monitoring, neurological assessment, discussion with family, other specialists and medical decision making of high complexity. I spent 60 minutes of neurocritical care time  in the care of  this patient. This was time spent independent of any time provided by nurse practitioner or PA.  Su Monks, MD [facility] [phone]  If 7pm- 7am, please page neurology on call as listed in [place].

## 2022-01-19 NOTE — Progress Notes (Signed)
ANTICOAGULATION CONSULT NOTE - Follow Up Consult  Pharmacy Consult for Heparin Dosing Indication:  history of PE (11/10/21)  Allergies  Allergen Reactions   Rifapentine Other (See Comments)    Flu-like symptoms   Amoxicillin Rash   Clavulanic Acid Rash    Patient Measurements: Height: '5\' 3"'$  (160 cm) Weight: 75 kg (165 lb 5.5 oz) IBW/kg (Calculated) : 52.4 Heparin Dosing Weight: 68 kg  Vital Signs: Temp: 99 F (37.2 C) (12/04 1800) Temp Source: Bladder (12/04 0700) BP: 113/76 (12/04 1800) Pulse Rate: 89 (12/04 1800)  Labs: Recent Labs    01/17/22 1033 01/17/22 1413 01/17/22 1415 01/18/22 0405 01/18/22 0720 01/18/22 1413 01/19/22 0456 01/19/22 0658 01/19/22 1700  HGB 14.1  --   --  10.6*  --  12.9  --   --   --   HCT 44.5  --   --  31.4*  --  38.0  --   --   --   PLT 245  --   --  174  --   --   --   --   --   APTT  --   --   --   --   --   --  154* 149* 107*  HEPARINUNFRC  --   --   --   --   --   --   --  >1.10*  --   CREATININE  --   --    < > UNSATISFACTORY SPECIMEN. CLIENT REQUESTS SPECIMEN TO BE ANALYZED. 0.47  --  0.47  --   --   CKTOTAL  --  311*  --   --   --   --   --   --   --    < > = values in this interval not displayed.     Estimated Creatinine Clearance: 66.1 mL/min (by C-G formula based on SCr of 0.47 mg/dL).   Medications:  Heparin 1000 units/hr infusion  Assessment: 73 YOF presented w concern for focal seizure activity despite compliance with home antiepileptic medications. Pt is on Eliquis 5 mg BID at home for hx of PE. Pharmacy consulted to transition patient to IV heparin.  Currently using aPTT to guide heparin dosing since DOAC can falsely elevate heparin levels  APTT level drawn given recent Eliquis administration and found to be supratherapeutic (154) today at 0456. Concern lab was drawn incorrectly, so aPTT redrawn at 0658 (149). Heparin level also supratherapeutic (>1.1) at this time. No s/sx of bleeding.   PM update: APTT still  slightly high, will adjust rate down and redraw APTT in 6 hours  Goal of Therapy:  Heparin level 0.3-0.7 units/ml APTT 66-102 Monitor platelets by anticoagulation protocol: Yes   Plan:  Decrease heparin infusion to 700 units/hr. Check aPTT in 6 hours  Monitor daily CBC, aPTT and heparin levels, and s/sx for bleeding.   Tiajuana Leppanen A. Levada Dy, PharmD, BCPS, [facility] Clinical Pharmacist [place] Please utilize Amion for appropriate phone number to reach the unit pharmacist ([place])  01/19/2022, 6:17 PM

## 2022-01-19 NOTE — Progress Notes (Signed)
Initial Nutrition Assessment  DOCUMENTATION CODES:  Not applicable  INTERVENTION:  Tube feeding via OGT Transition to Vital 1.5 at 40m/hr x 24hrs (9655mtotal volume) Provide 6083mrosource TF20 BID  Provides 1600kcal, 105g protein and 733m4mee water  NUTRITION DIAGNOSIS:  Inadequate oral intake related to other (see comment) (postictal, intubation) as evidenced by NPO status.  GOAL:  Patient will meet greater than or equal to 90% of their needs  MONITOR:  Vent status, TF tolerance  REASON FOR ASSESSMENT:  Consult Enteral/tube feeding initiation and management  ASSESSMENT:  Pt is a 66yo72yoith PMH of seasonal allergy, anal fissure, anxiety, depression, cataracts, elevated LFTs, hyperglycemia,HLD, HTN, osteoarthritis pars ephedra lumbar spine, spondylolisthesis of the lumbar spine, and partial seizures who presents with focal seizure an hour before arrival.   Pt remains intubated for airway protection.Sedated on propofol at 11.3mL/80mto provide 298kcal from fat/day. She was started on Vital HP at 40mL/28mith 60mL P69murce TF20 q day per protocol. Recommend transitioning to Vital 1.5 at 40mL/hr90m4hrs (960mL tot65molume) and 60mL Pros67m TF20 BID to provide 1600kcal, 105g protein and 733mL free 45mr. Adjust EN with medications as clinically appropriate  Visited at bedside to complete NFPE. No family present to provide nutrition related history. Noted significant 5.3% weight loss in the last 1 month. No other s/s of malnutrition present at this time.  Medications reviewed and include: decadron, colace, pepcid, miralax,keppra, propofol  Labs reviewed: BG:139, BUN:7, triglycerides:126   NUTRITION - FOCUSED PHYSICAL EXAM:  Flowsheet Row Most Recent Value  Orbital Region No depletion  Upper Arm Region Mild depletion  Thoracic and Lumbar Region No depletion  Buccal Region Unable to assess  Temple Region Mild depletion  Clavicle Bone Region No depletion  Clavicle and  Acromion Bone Region No depletion  Scapular Bone Region Unable to assess  Dorsal Hand No depletion  Patellar Region No depletion  Anterior Thigh Region No depletion  Posterior Calf Region No depletion  Hair Reviewed  Eyes Unable to assess  Mouth Unable to assess  Skin Reviewed  Nails Reviewed       Diet Order:   Diet Order             Diet NPO time specified  Diet effective now                   EDUCATION NEEDS:  Not appropriate for education at this time  Skin:  Skin Assessment: Reviewed RN Assessment  Last BM:  unknown, PTA  Height:  Ht Readings from Last 1 Encounters:  01/18/22 '5\' 3"'$  (1.6 m)    Weight:  Wt Readings from Last 1 Encounters:  01/19/22 75 kg    Ideal Body Weight:     BMI:  Body mass index is 29.29 kg/m.  Estimated Nutritional Needs:  Kcal:  1875-2250kcal Protein:  90-115g Fluid:  1875-2250mL  Katie28maccCandise BowensN, CNSC See AMiON for contact information

## 2022-01-20 DIAGNOSIS — G40901 Epilepsy, unspecified, not intractable, with status epilepticus: Secondary | ICD-10-CM | POA: Diagnosis not present

## 2022-01-20 DIAGNOSIS — R569 Unspecified convulsions: Secondary | ICD-10-CM | POA: Diagnosis not present

## 2022-01-20 DIAGNOSIS — C719 Malignant neoplasm of brain, unspecified: Secondary | ICD-10-CM

## 2022-01-20 LAB — CBC
HCT: 38.3 % (ref 36.0–46.0)
Hemoglobin: 12.9 g/dL (ref 12.0–15.0)
MCH: 30.7 pg (ref 26.0–34.0)
MCHC: 33.7 g/dL (ref 30.0–36.0)
MCV: 91.2 fL (ref 80.0–100.0)
Platelets: 175 10*3/uL (ref 150–400)
RBC: 4.2 MIL/uL (ref 3.87–5.11)
RDW: 13.7 % (ref 11.5–15.5)
WBC: 7.9 10*3/uL (ref 4.0–10.5)
nRBC: 0 % (ref 0.0–0.2)

## 2022-01-20 LAB — COMPREHENSIVE METABOLIC PANEL WITH GFR
ALT: 19 U/L (ref 0–44)
AST: 24 U/L (ref 15–41)
Albumin: 2.8 g/dL — ABNORMAL LOW (ref 3.5–5.0)
Alkaline Phosphatase: 49 U/L (ref 38–126)
Anion gap: 10 (ref 5–15)
BUN: 12 mg/dL (ref 8–23)
CO2: 21 mmol/L — ABNORMAL LOW (ref 22–32)
Calcium: 8.2 mg/dL — ABNORMAL LOW (ref 8.9–10.3)
Chloride: 108 mmol/L (ref 98–111)
Creatinine, Ser: 0.45 mg/dL (ref 0.44–1.00)
GFR, Estimated: >60 mL/min
Glucose, Bld: 155 mg/dL — ABNORMAL HIGH (ref 70–99)
Potassium: 4.5 mmol/L (ref 3.5–5.1)
Sodium: 139 mmol/L (ref 135–145)
Total Bilirubin: 0.6 mg/dL (ref 0.3–1.2)
Total Protein: 5 g/dL — ABNORMAL LOW (ref 6.5–8.1)

## 2022-01-20 LAB — PHOSPHORUS: Phosphorus: 2.9 mg/dL (ref 2.5–4.6)

## 2022-01-20 LAB — GLUCOSE, CAPILLARY
Glucose-Capillary: 129 mg/dL — ABNORMAL HIGH (ref 70–99)
Glucose-Capillary: 140 mg/dL — ABNORMAL HIGH (ref 70–99)
Glucose-Capillary: 150 mg/dL — ABNORMAL HIGH (ref 70–99)
Glucose-Capillary: 161 mg/dL — ABNORMAL HIGH (ref 70–99)
Glucose-Capillary: 179 mg/dL — ABNORMAL HIGH (ref 70–99)
Glucose-Capillary: 186 mg/dL — ABNORMAL HIGH (ref 70–99)

## 2022-01-20 LAB — APTT
aPTT: 97 s — ABNORMAL HIGH (ref 24–36)
aPTT: 62 seconds — ABNORMAL HIGH (ref 24–36)
aPTT: 78 s — ABNORMAL HIGH (ref 24–36)

## 2022-01-20 LAB — MAGNESIUM: Magnesium: 2 mg/dL (ref 1.7–2.4)

## 2022-01-20 LAB — LEVETIRACETAM LEVEL: Levetiracetam Lvl: 29.4 ug/mL (ref 10.0–40.0)

## 2022-01-20 LAB — HEPARIN LEVEL (UNFRACTIONATED): Heparin Unfractionated: 1.05 [IU]/mL — ABNORMAL HIGH (ref 0.30–0.70)

## 2022-01-20 MED ORDER — PANTOPRAZOLE SODIUM 40 MG IV SOLR
40.0000 mg | INTRAVENOUS | Status: DC
  Administered 2022-01-20 – 2022-01-25 (×6): 40 mg via INTRAVENOUS
  Filled 2022-01-20 (×6): qty 10

## 2022-01-20 NOTE — Progress Notes (Addendum)
ANTICOAGULATION CONSULT NOTE - Follow Up Consult  Pharmacy Consult for Heparin Indication:  History of PE (9/23)  Allergies  Allergen Reactions   Rifapentine Other (See Comments)    Flu-like symptoms   Amoxicillin Rash   Clavulanic Acid Rash    Patient Measurements: Height: '5\' 3"'$  (160 cm) Weight: 77 kg (169 lb 12.1 oz) IBW/kg (Calculated) : 52.4 Heparin Dosing Weight: 68 kg  Vital Signs: Temp: 99.1 F (37.3 C) (12/05 0700) Temp Source: Bladder (12/05 0400) BP: 118/79 (12/05 0802) Pulse Rate: 95 (12/05 0802)  Labs: Recent Labs    01/17/22 1033 01/17/22 1413 01/17/22 1415 01/18/22 0405 01/18/22 0720 01/18/22 1413 01/19/22 0456 01/19/22 0456 01/19/22 0658 01/19/22 1700 01/20/22 0048 01/20/22 0444  HGB 14.1  --   --  10.6*  --  12.9  --   --   --   --   --  12.9  HCT 44.5  --   --  31.4*  --  38.0  --   --   --   --   --  38.3  PLT 245  --   --  174  --   --   --   --   --   --   --  175  APTT  --   --   --   --   --   --  154*   < > 149* 107* 97* 62*  HEPARINUNFRC  --   --   --   --   --   --   --   --  >1.10*  --   --  1.05*  CREATININE  --   --    < > UNSATISFACTORY SPECIMEN. CLIENT REQUESTS SPECIMEN TO BE ANALYZED. 0.47  --  0.47  --   --   --   --  0.45  CKTOTAL  --  311*  --   --   --   --   --   --   --   --   --   --    < > = values in this interval not displayed.    Estimated Creatinine Clearance: 67 mL/min (by C-G formula based on SCr of 0.45 mg/dL).  Assessment: 66 YO female presented with concern for focal seizure and status epilepticus. On Eliquis at home for history of PE in September 2023. Heparin was reduced from 1,000 units/hr to 700 units/hr on 12/4 due to supratherapeutic aPTT (149) and heparin level > 1.1. Most recent aPTT is 62 and subtherapeutic. Per conversation with nurse, no s/sx of bleeding or concerns with infusion/lab drawn.   Goal of Therapy:  Heparin level 0.3-0.7 units/ml aPTT 66-102 seconds Monitor platelets by anticoagulation  protocol: Yes   Plan:  Increase heparin from 700 units/hr to 750 units/hr.  Check aPTT in 6 hours. Monitor daily CBC, aPTT and heparin levels, and s/sx of bleeding.   Abdulhadi Stopa 01/20/2022,9:13 AM

## 2022-01-20 NOTE — Progress Notes (Signed)
  Transition of Care [facility]) Screening Note   Patient Details  Name: Lindsey Daniels Date of Birth: 06-Dec-1955   Transition of [facility] CM/SW Contact:    Benard Halsted, LCSW Phone Number: 01/20/2022, 5:30 PM    Transition of Care Department [facility]) has reviewed patient who is currently intubated and no TOC needs have been identified at this time. We will continue to monitor patient advancement through interdisciplinary progression rounds. If new patient transition needs arise, please place a TOC consult.

## 2022-01-20 NOTE — Procedures (Signed)
EEG Procedure CPT/Type of Study: 76283; 24hr EEG with video Referring Provider: Avon Gully Primary Neurological Diagnosis: seizures   History: This is a 66 yr old patient, undergoing an EEG to evaluate for seizures, status epilepticus. Clinical State: disoriented   Technical Description:   The EEG was performed using standard setting per the guidelines of American Clinical Neurophysiology Society (ACNS).   A minimum of 21 electrodes were placed on scalp according to the International 10-20 or/and 10-10 Systems. Supplemental electrodes were placed as needed. Single EKG electrode was also used to detect cardiac arrhythmia. Patient's behavior was continuously recorded on video simultaneously with EEG. A minimum of 16 channels were used for data display. Each epoch of study was reviewed manually daily and as needed using standard referential and bipolar montages. Computerized quantitative EEG analysis (such as compressed spectral array analysis, trending, automated spike & seizure detection) were used as indicated.    Day 3: from 0730 01/19/22 to 0730 01/20/22   EEG Description: Overall Amplitude:Normal Predominant Frequency: The background activity showed delta/theta slowing with occasional periods of voltage attenuation. Superimposed Frequencies: sparse beta activity, decreased on the right The background was asymmetric, decreased fast on the right   Background Abnormalities: focal slowing, right centro-temporal focal delta slowing Rhythmic or periodic pattern: Yes; LPDs in the right centro-temporal region occurring frequently, 1-'2Hz'$  frequency with overriding fast activity Epileptiform activity: Yes; right central spiky morphology without clear ictal evolution Electrographic seizures: No Events: no    Breach rhythm: no   Reactivity: Present   Stimulation procedures:  Hyperventilation: not done Photic stimulation: no change   Sleep Background: Stage II   EKG: irregular rhythm    Impression: This was an abnormal continuous video EEG due to right central spiky LPDs and some voltage attenuation. There was no clear ictal evolution to the LPD pattern on the right. This was indicative of an ongoing focal epileptogenic disturbance on the right, consistent with the patient's history of a lesion in that area.

## 2022-01-20 NOTE — Progress Notes (Signed)
[place] NOTE  Pharmacy Consult for Heparin  Indication:  history of PE  Brief A/P: aPTT within goal range Continue Heparin at current rate   Allergies  Allergen Reactions   Rifapentine Other (See Comments)    Flu-like symptoms   Amoxicillin Rash   Clavulanic Acid Rash    Patient Measurements: Height: '5\' 3"'$  (160 cm) Weight: 75 kg (165 lb 5.5 oz) IBW/kg (Calculated) : 52.4 Heparin Dosing Weight: 68 kg  Vital Signs: Temp: 98.8 F (37.1 C) (12/05 0100) Temp Source: Bladder (12/05 0000) BP: 107/72 (12/05 0100) Pulse Rate: 79 (12/05 0100)  Labs: Recent Labs    01/17/22 1033 01/17/22 1413 01/17/22 1415 01/18/22 0405 01/18/22 0720 01/18/22 1413 01/19/22 0456 01/19/22 0456 01/19/22 0658 01/19/22 1700 01/20/22 0048  HGB 14.1  --   --  10.6*  --  12.9  --   --   --   --   --   HCT 44.5  --   --  31.4*  --  38.0  --   --   --   --   --   PLT 245  --   --  174  --   --   --   --   --   --   --   APTT  --   --   --   --   --   --  154*   < > 149* 107* 97*  HEPARINUNFRC  --   --   --   --   --   --   --   --  >1.10*  --   --   CREATININE  --   --    < > UNSATISFACTORY SPECIMEN. CLIENT REQUESTS SPECIMEN TO BE ANALYZED. 0.47  --  0.47  --   --   --   --   CKTOTAL  --  311*  --   --   --   --   --   --   --   --   --    < > = values in this interval not displayed.     Estimated Creatinine Clearance: 66.1 mL/min (by C-G formula based on SCr of 0.47 mg/dL).  Assessment: 66 y.o. female with h/o PE, Eliquis on hold, for heparin  Goal of Therapy:  Heparin level 0.3-0.7 units/ml APTT 66-102 seconds Monitor platelets by anticoagulation protocol: Yes   Plan:  Continue Heparin at current rate   Phillis Knack, PharmD, BCPS  01/20/2022, 1:25 AM

## 2022-01-20 NOTE — Progress Notes (Signed)
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  It sounds like she has had progressive decline at home related to her seizures. Lateral to [facility] ED.  She was admitted to [facility] with neuro consult. Keppra loaded. MRI brain had an acute change of abnormal diffusion in lateral R perirolandic cortex. cEEG with focal epileptogenicity.    On 12/3 despite escalation of AEDs, still having evidence of focal seizures. Pt remains very somnolent. With need for AED escalation and pts current mental status, PCCM is consulted for elective intubation.    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 Keppra loaded., neuro consulted admit to [facility] 12/3 need for AED escalation. Mentation concerning for airway protection in CNS depressing meds increased. PCCM consulted for elective intubation.  12/4 further uptitration of AEDs. Remaining intubated. Adding mIVF  12/5 continues to have epileptogenic disturbance on eeg    Interim History / Subjective:   NAEO   EEG still with R central spike LPDs, focal epileptogenic disturbance   In this setting, did not pause sedation for WUA and neuro exam   Objective   Blood pressure 118/80, pulse 89, temperature 99.5 F (37.5 C), resp. rate 20, height '5\' 3"'$  (1.6 m), weight 77 kg, SpO2 100 %.    Vent Mode: PSV;CPAP FiO2 (%):  [40 %] 40 % Set Rate:  [17 bmp] 17 bmp Vt Set:  [420 mL] 420 mL PEEP:  [5 cmH20] 5  cmH20 Pressure Support:  [10 cmH20] 10 cmH20 Plateau Pressure:  [12 cmH20-18 cmH20] 15 cmH20   Intake/Output Summary (Last 24 hours) at 01/20/2022 0941 Last data filed at 01/20/2022 0900 Gross per 24 hour  Intake 2904.66 ml  Output 965 ml  Net 1939.66 ml   Filed Weights   01/18/22 1857 01/19/22 0500 01/20/22 0500  Weight: 72.9 kg 75 kg 77 kg    Examination: General:  critically and chronically ill appearing older adult F intubated sedated NAD  HENT: NCAT eeg in place ETT secure  Lungs: CTAb symmetrical chest expansion, mechanically ventilated  Cardiovascular: rr s1s2 2+ rad pulses, cool fingers with sluggish refill  Abdomen: soft ndnt  Extremities: No acute deformity  Skin: resolving shoulder and arm bruising  Neuro:  sedated, 1m pupils  GU: foley   Resolved Hospital Problem list   hypoK hypomag  Assessment & Plan:   Focal SE with hx of seizure disorder Acute encephalopathy in setting of above Hx pleomorphic xanthroastrocytoma s/p crani (2015, 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 Onfi up titrated 12/4 & is on keppra -cont cEEG -cont decadron  -on prop (and PRN fent) for PAD -PRN BZD available  Acute respiratory failure / need for MV in for airway protection in setting of SE P -Cont MV support, wean as able   -lung protective ventilation -PRN CXR -VAP, pulm hygiene  HTN HLD -atorvastatin -holding home antihypertensives. With sedation needed for ETT her BP is currently appropriate without additional agents. Will add if needed  Hx PE on home eliquis  -hep gtt per pharmD  Oliguria, improved  -keep foley -mIVF   Inadequate PO intake  -EN per RDN  Best Practice (right click and "Reselect all SmartList Selections" daily)   Diet/type: tubefeeds and NPO DVT prophylaxis: systemic heparin GI prophylaxis: PPI Lines: N/A Foley:  N/A Code Status:  limited Last date of  multidisciplinary goals of care discussion [12/3 neuro d/w family prior to ICU admission] Neuro discussed code status and elective intubation: temporary intubation for seizure control but she would not want CPR or extended life supports   Labs   CBC: Recent Labs  Lab 01/13/22 1359 01/17/22 1033 01/18/22 0405 01/18/22 1413 01/20/22 0444  WBC 7.3 9.1 4.2  --  7.9  NEUTROABS 5.3 6.6  --   --   --   HGB 15.6* 14.1 10.6* 12.9 12.9  HCT 47.7* 44.5 31.4* 38.0 38.3  MCV 90.0 92.3 90.2  --  91.2  PLT 277 245 174  --  546    Basic Metabolic Panel: Recent Labs  Lab 01/17/22 1413 01/17/22 1415 01/18/22 0405 01/18/22 0720 01/18/22 1303 01/18/22 1413 01/19/22 0456 01/20/22 0444  NA  --  142 UNSATISFACTORY SPECIMEN. CLIENT REQUESTS SPECIMEN TO BE ANALYZED. 141  --  141 142 139  K  --  2.3* UNSATISFACTORY SPECIMEN. CLIENT REQUESTS SPECIMEN TO BE ANALYZED. 3.7  --  3.6 3.5 4.5  CL  --  104 UNSATISFACTORY SPECIMEN. CLIENT REQUESTS SPECIMEN TO BE ANALYZED. 112*  --   --  108 108  CO2  --  27 UNSATISFACTORY SPECIMEN. CLIENT REQUESTS SPECIMEN TO BE ANALYZED. 22  --   --  25 21*  GLUCOSE  --  106* UNSATISFACTORY SPECIMEN. CLIENT REQUESTS SPECIMEN TO BE ANALYZED. 128*  --   --  139* 155*  BUN  --  13 UNSATISFACTORY SPECIMEN. CLIENT REQUESTS SPECIMEN TO BE ANALYZED. 5*  --   --  7* 12  CREATININE  --  0.49 UNSATISFACTORY SPECIMEN. CLIENT REQUESTS SPECIMEN TO BE ANALYZED. 0.47  --   --  0.47 0.45  CALCIUM  --  9.4 UNSATISFACTORY SPECIMEN. CLIENT REQUESTS SPECIMEN TO BE ANALYZED. 8.2*  --   --  8.8* 8.2*  MG 1.6*  --   --   --  1.8  --  1.7 2.0  PHOS  --   --   --   --   --   --  3.4 2.9   GFR: Estimated Creatinine Clearance: 67 mL/min (by C-G formula based on SCr of 0.45 mg/dL). Recent Labs  Lab 01/13/22 1359 01/17/22 1033 01/17/22 1047 01/17/22 1411 01/18/22 0405 01/20/22 0444  WBC 7.3 9.1  --   --  4.2 7.9  LATICACIDVEN  --   --  2.3* 1.5  --   --     Liver Function Tests: Recent  Labs  Lab 01/13/22 1359 01/17/22 1415 01/18/22 0405 01/18/22 0720 01/20/22 0444  AST 41 32 UNSATISFACTORY SPECIMEN. CLIENT REQUESTS SPECIMEN TO BE ANALYZED. 29 24  ALT 24 20 UNSATISFACTORY SPECIMEN. CLIENT REQUESTS SPECIMEN TO BE ANALYZED. 18 19  ALKPHOS 59 52 UNSATISFACTORY SPECIMEN. CLIENT REQUESTS SPECIMEN TO BE ANALYZED. 39 49  BILITOT 1.9* 0.8 UNSATISFACTORY SPECIMEN. CLIENT REQUESTS SPECIMEN TO BE ANALYZED. 0.5 0.6  PROT 6.9 6.9 UNSATISFACTORY SPECIMEN. CLIENT REQUESTS SPECIMEN TO BE ANALYZED. 5.2* 5.0*  ALBUMIN 3.9 3.7 UNSATISFACTORY SPECIMEN.  CLIENT REQUESTS SPECIMEN TO BE ANALYZED. 3.1* 2.8*   No results for input(s): "LIPASE", "AMYLASE" in the last 168 hours. No results for input(s): "AMMONIA" in the last 168 hours.  ABG    Component Value Date/Time   PHART 7.313 (L) 01/18/2022 1413   PCO2ART 45.5 01/18/2022 1413   PO2ART 561 (H) 01/18/2022 1413   HCO3 23.1 01/18/2022 1413   TCO2 24 01/18/2022 1413   ACIDBASEDEF 3.0 (H) 01/18/2022 1413   O2SAT 100 01/18/2022 1413     Coagulation Profile: No results for input(s): "INR", "PROTIME" in the last 168 hours.  Cardiac Enzymes: Recent Labs  Lab 01/17/22 1413  CKTOTAL 311*    HbA1C: Hemoglobin A1C  Date/Time Value Ref Range Status  06/07/2015 08:30 AM 5.5  Final   Hgb A1c MFr Bld  Date/Time Value Ref Range Status  06/30/2017 06:15 PM 5.4 4.8 - 5.6 % Final    Comment:             Prediabetes: 5.7 - 6.4          Diabetes: >6.4          Glycemic control for adults with diabetes: <7.0     CBG: Recent Labs  Lab 01/19/22 1534 01/19/22 1929 01/19/22 2315 01/20/22 0322 01/20/22 0757  GLUCAP 176* 158* 197* 186* 140*   CRITICAL CARE Performed by: Cristal Generous   Total critical care time: 35 minutes  Critical care time was exclusive of separately billable procedures and treating other patients. Critical care was necessary to treat or prevent imminent or life-threatening deterioration.  Critical care was  time spent personally by me on the following activities: development of treatment plan with patient and/or surrogate as well as nursing, discussions with consultants, evaluation of patient's response to treatment, examination of patient, obtaining history from patient or surrogate, ordering and performing treatments and interventions, ordering and review of laboratory studies, ordering and review of radiographic studies, pulse oximetry and re-evaluation of patient's condition.  Eliseo Gum MSN, AGACNP-BC [place] for pager  01/20/2022, 9:41 AM

## 2022-01-20 NOTE — Progress Notes (Signed)
   01/20/22 1952  Vent Select  Invasive or Noninvasive Invasive  Adult Vent Y  Airway 7.5 mm  Placement Date/Time: 01/18/22 1856   Placed By: ICU physician  Airway Device: Oral Pharyngeal Airway  Laryngoscope Blade: MAC;3  ETT Types: Endobronchial  Size (mm): 7.5 mm  Cuffed: Cuffed  Insertion attempts: 1  Airway Equipment: Video Laryngoscope;S...  Secured at (cm) 23 cm  Measured From Lips  Secured Location Right  Secured By Actuary Repositioned Yes  Prone position No  Cuff Pressure (cm H2O) Green OR 18-26 CmH2O  Site Condition Dry  Adult Ventilator Settings  Vent Type Servo U  Humidity HME  Vent Mode PRVC  Vt Set 420 mL  Set Rate 17 bmp  FiO2 (%) 40 %  I Time 0.9 Sec(s)  PEEP 5 cmH20  Adult Ventilator Measurements  Peak Airway Pressure 20 L/min  Mean Airway Pressure 10 cmH20  Plateau Pressure 15 cmH20  Resp Rate Spontaneous 7 br/min  Resp Rate Total 24 br/min  Exhaled Vt 428 mL  Measured Ve 10.3 mL  I:E Ratio Measured 1:2.0  Auto PEEP 0 cmH20  Total PEEP 5 cmH20  Adult Ventilator Alarms  Alarms On Y  Ve High Alarm 20 L/min  Ve Low Alarm 4 L/min  Resp Rate High Alarm 38 br/min  Resp Rate Low Alarm 10  PEEP Low Alarm 3 cmH2O  Press High Alarm 40 cmH2O  T Apnea 20 sec(s)  VAP Prevention  HOB> 30 Degrees Y  Breath Sounds  Bilateral Breath Sounds Diminished  Vent Respiratory Assessment  Respiratory Pattern Regular;Unlabored  Airway Suctioning/Secretions  Suction Type ETT  Suction Device  Catheter  Secretion Amount Small  Secretion Color White  Secretion Consistency Thick;Thin  Suction Tolerance Tolerated well  Suctioning Adverse Effects None   Placed pt. Back on full support to rest for night per md order \

## 2022-01-20 NOTE — Progress Notes (Signed)
ANTICOAGULATION CONSULT NOTE - Follow Up Consult  Pharmacy Consult for Heparin Indication:  History of PE (9/23)  Allergies  Allergen Reactions   Rifapentine Other (See Comments)    Flu-like symptoms   Amoxicillin Rash   Clavulanic Acid Rash    Patient Measurements: Height: '5\' 3"'$  (160 cm) Weight: 77 kg (169 lb 12.1 oz) IBW/kg (Calculated) : 52.4 Heparin Dosing Weight: 68 kg  Vital Signs: Temp: 99.5 F (37.5 C) (12/05 1500) BP: 120/77 (12/05 1600) Pulse Rate: 88 (12/05 1600)  Labs: Recent Labs    01/18/22 0405 01/18/22 0720 01/18/22 1413 01/19/22 0456 01/19/22 0456 01/19/22 0658 01/19/22 1700 01/20/22 0048 01/20/22 0444 01/20/22 1614  HGB 10.6*  --  12.9  --   --   --   --   --  12.9  --   HCT 31.4*  --  38.0  --   --   --   --   --  38.3  --   PLT 174  --   --   --   --   --   --   --  175  --   APTT  --   --   --  154*   < > 149*   < > 97* 62* 78*  HEPARINUNFRC  --   --   --   --   --  >1.10*  --   --  1.05*  --   CREATININE UNSATISFACTORY SPECIMEN. CLIENT REQUESTS SPECIMEN TO BE ANALYZED. 0.47  --  0.47  --   --   --   --  0.45  --    < > = values in this interval not displayed.     Estimated Creatinine Clearance: 67 mL/min (by C-G formula based on SCr of 0.45 mg/dL).  Assessment: 66 YO female presented with concern for focal seizure and status epilepticus. On Eliquis at home for history of PE in September 2023.  Pharmacy consulted to dose IV heparin while Eliquis is on hold.  aPTT therapeutic at 78 sec; no bleeding reported.  Goal of Therapy:  Heparin level 0.3-0.7 units/ml aPTT 66-102 seconds Monitor platelets by anticoagulation protocol: Yes   Plan:  Continue heparin infusion at 750 units/hr Monitor daily CBC, aPTT and heparin levels, and s/sx of bleeding.   Roux Brandy D. Mina Marble, PharmD, BCPS, [place] 01/20/2022, 5:14 PM

## 2022-01-20 NOTE — Progress Notes (Signed)
LTM maint complete - no skin breakdown under: Fp1 Fp1  [facility] monitored, Event button test confirmed by [facility].

## 2022-01-20 NOTE — Progress Notes (Signed)
Neurology Progress Note   Subjective: - patient intubated - sedation weaned down to propofol 30 without seizure recurrence  EEG 12/5-12/6: persistent R LPDs, intermittent suppression 2/2 medication effect, no evolution to seizures EEG 12/4-12/5: persistent R LPDs, intermittent suppression 2/2 medication effect, no evolution to seizures EEG 12/3-12/4: persistent R LPDs, intermittent suppression 2/2 medication effect, no evolution to seizures EEG 12/2-12/3: right central spiky LPDs with subtle ictal evolution, indicative of ongoing focal epileptogenicity in that region.    Exam: Current vital signs: BP (!) 143/87   Pulse 100   Temp 99.5 F (37.5 C)   Resp (!) 21   Ht '5\' 3"'$  (1.6 m)   Wt 77 kg   SpO2 100%   BMI 30.07 kg/m  Vital signs in last 24 hours: Temp:  [98.2 F (36.8 C)-100 F (37.8 C)] 99.5 F (37.5 C) (12/05 1500) Pulse Rate:  [78-116] 100 (12/05 1900) Resp:  [0-35] 21 (12/05 1900) BP: (103-143)/(65-106) 143/87 (12/05 1900) SpO2:  [99 %-100 %] 100 % (12/05 1900) FiO2 (%):  [40 %] 40 % (12/05 1535) Weight:  [77 kg] 77 kg (12/05 0500)  Exam on propofol 25  Gen: In bed, intubated, sedated Resp: ventilated Cardiac: Perfusing extremities well  Abd: soft, nt  Neuro: MS: opens eyes to voice, does not follow commands CN: pupils 43m ERRL, (+) corneals, oculocephalics, cough, gag Motor & sensory: spontaneous movement on R but not L  Pertinent Labs:  Basic Metabolic Panel: Recent Labs  Lab 01/13/22 1359 01/17/22 1413 01/17/22 1415 01/18/22 0720  NA 139  --  142 141  K 3.3*  --  2.3* 3.7  CL 104  --  104 112*  CO2 17*  --  27 22  GLUCOSE 142*  --  106* 128*  BUN 6*  --  13 5*  CREATININE 0.68  --  0.49 0.47  CALCIUM 9.5  --  9.4 8.2*  MG  --  1.6*  --   --     CBC: Recent Labs  Lab 01/17/22 1033 01/18/22 0405 01/18/22 1413 01/20/22 0444  WBC 9.1 4.2  --  7.9  NEUTROABS 6.6  --   --   --   HGB 14.1 10.6* 12.9 12.9  HCT 44.5 31.4* 38.0 38.3  MCV  92.3 90.2  --  91.2  PLT 245 174  --  175     Coagulation Studies: No results for input(s): "LABPROT", "INR" in the last 72 hours.    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 Status resolved after intubation and sedation with propofol and versed although she still has R LPDs. Vimpat d/c'd 2/2 LBBB on EKG.  Recommendations: - Continue long-term EEG monitoring - Continue keppra 1000 mg BID (renal dosing) - Increase onfi to 7.'5mg'$  bid - Please continue to wean propofol to off. If no electrographic seizures after sedation is weaned LTM can be discontinued. - Neurology will continue to follow  Estimated Creatinine Clearance: 67 mL/min (by C-G formula based on SCr of 0.45 mg/dL).   CrCl 80 to 130 mL/minute/1.73 m2: 500 mg to 1.5 g every 12 hours.  CrCl 50 to <80 mL/minute/1.73 m2: 500 mg to 1 g every 12 hours.  CrCl 30 to <50 mL/minute/1.73 m2: 250 to 750 mg every 12 hours.  CrCl 15 to <30 mL/minute/1.73 m2: 250 to 500 mg every 12 hours.  CrCl <15 mL/minute/1.73 m2: 250 to 500 mg every  24 hours (expert opinion).    This patient is critically ill and at significant risk of neurological worsening, death and care requires constant monitoring of vital signs, hemodynamics,respiratory and cardiac monitoring, neurological assessment, discussion with family, other specialists and medical decision making of high complexity. I spent 60 minutes of neurocritical care time  in the care of  this patient. This was time spent independent of any time provided by nurse practitioner or PA.  Su Monks, MD [facility] [phone]  If 7pm- 7am, please page neurology on call as listed in [place].

## 2022-01-21 ENCOUNTER — Inpatient Hospital Stay (HOSPITAL_COMMUNITY): Payer: BC Managed Care – PPO

## 2022-01-21 DIAGNOSIS — G40901 Epilepsy, unspecified, not intractable, with status epilepticus: Secondary | ICD-10-CM

## 2022-01-21 LAB — MAGNESIUM: Magnesium: 1.9 mg/dL (ref 1.7–2.4)

## 2022-01-21 LAB — HEPARIN LEVEL (UNFRACTIONATED): Heparin Unfractionated: 0.92 IU/mL — ABNORMAL HIGH (ref 0.30–0.70)

## 2022-01-21 LAB — CBC
HCT: 43.3 % (ref 36.0–46.0)
Hemoglobin: 14.3 g/dL (ref 12.0–15.0)
MCH: 30.2 pg (ref 26.0–34.0)
MCHC: 33 g/dL (ref 30.0–36.0)
MCV: 91.5 fL (ref 80.0–100.0)
Platelets: 169 10*3/uL (ref 150–400)
RBC: 4.73 MIL/uL (ref 3.87–5.11)
RDW: 13.8 % (ref 11.5–15.5)
WBC: 10.4 10*3/uL (ref 4.0–10.5)
nRBC: 0 % (ref 0.0–0.2)

## 2022-01-21 LAB — GLUCOSE, CAPILLARY
Glucose-Capillary: 118 mg/dL — ABNORMAL HIGH (ref 70–99)
Glucose-Capillary: 125 mg/dL — ABNORMAL HIGH (ref 70–99)
Glucose-Capillary: 133 mg/dL — ABNORMAL HIGH (ref 70–99)
Glucose-Capillary: 142 mg/dL — ABNORMAL HIGH (ref 70–99)
Glucose-Capillary: 163 mg/dL — ABNORMAL HIGH (ref 70–99)
Glucose-Capillary: 164 mg/dL — ABNORMAL HIGH (ref 70–99)

## 2022-01-21 LAB — PHOSPHORUS: Phosphorus: 3.2 mg/dL (ref 2.5–4.6)

## 2022-01-21 LAB — APTT
aPTT: 54 seconds — ABNORMAL HIGH (ref 24–36)
aPTT: 66 seconds — ABNORMAL HIGH (ref 24–36)

## 2022-01-21 LAB — HEMOGLOBIN A1C
Hgb A1c MFr Bld: 5.3 % (ref 4.8–5.6)
Mean Plasma Glucose: 105 mg/dL

## 2022-01-21 MED ORDER — CLOBAZAM 2.5 MG/ML PO SUSP
7.5000 mg | Freq: Two times a day (BID) | ORAL | Status: DC
  Administered 2022-01-21 – 2022-01-26 (×9): 7.5 mg
  Filled 2022-01-21 (×10): qty 4

## 2022-01-21 MED ORDER — INSULIN ASPART 100 UNIT/ML IJ SOLN
0.0000 [IU] | INTRAMUSCULAR | Status: DC
  Administered 2022-01-21: 3 [IU] via SUBCUTANEOUS
  Administered 2022-01-21 (×2): 2 [IU] via SUBCUTANEOUS
  Administered 2022-01-22: 3 [IU] via SUBCUTANEOUS
  Administered 2022-01-22: 2 [IU] via SUBCUTANEOUS
  Administered 2022-01-22: 3 [IU] via SUBCUTANEOUS
  Administered 2022-01-22: 2 [IU] via SUBCUTANEOUS
  Administered 2022-01-22: 3 [IU] via SUBCUTANEOUS
  Administered 2022-01-22 – 2022-01-23 (×3): 2 [IU] via SUBCUTANEOUS
  Administered 2022-01-23 (×3): 3 [IU] via SUBCUTANEOUS
  Administered 2022-01-24: 2 [IU] via SUBCUTANEOUS
  Administered 2022-01-24 (×2): 3 [IU] via SUBCUTANEOUS
  Administered 2022-01-24 – 2022-01-25 (×4): 2 [IU] via SUBCUTANEOUS

## 2022-01-21 MED ORDER — HEPARIN (PORCINE) 25000 UT/250ML-% IV SOLN
900.0000 [IU]/h | INTRAVENOUS | Status: DC
  Administered 2022-01-21 – 2022-01-23 (×3): 900 [IU]/h via INTRAVENOUS
  Filled 2022-01-21 (×3): qty 250

## 2022-01-21 NOTE — Procedures (Signed)
EEG Procedure CPT/Type of Study: 16606; 24hr EEG with video Referring Provider: Avon Gully Primary Neurological Diagnosis: seizures   History: This is a 66 yr old patient, undergoing an EEG to evaluate for seizures, status epilepticus. Clinical State: disoriented   Technical Description:   The EEG was performed using standard setting per the guidelines of American Clinical Neurophysiology Society (ACNS).   A minimum of 21 electrodes were placed on scalp according to the International 10-20 or/and 10-10 Systems. Supplemental electrodes were placed as needed. Single EKG electrode was also used to detect cardiac arrhythmia. Patient's behavior was continuously recorded on video simultaneously with EEG. A minimum of 16 channels were used for data display. Each epoch of study was reviewed manually daily and as needed using standard referential and bipolar montages. Computerized quantitative EEG analysis (such as compressed spectral array analysis, trending, automated spike & seizure detection) were used as indicated.    Day 4: from 0730 01/20/22 to 0730 01/21/22   EEG Description: Overall Amplitude:Normal Predominant Frequency: The background activity showed delta/theta slowing with occasional periods of voltage attenuation. Superimposed Frequencies: sparse beta activity, decreased on the right The background was asymmetric, decreased fast on the right   Background Abnormalities: focal slowing, right centro-temporal focal delta slowing Rhythmic or periodic pattern: Yes; LPDs in the right centro-temporal region occurring frequently, 1-'2Hz'$  frequency with overriding fast activity Epileptiform activity: Yes; right central spiky morphology without clear ictal evolution Electrographic seizures: No Events: no    Breach rhythm: no   Reactivity: Present   Stimulation procedures:  Hyperventilation: not done Photic stimulation: no change   Sleep Background: Stage II   EKG: irregular rhythm    Impression: This was an abnormal continuous video EEG due to right central spiky LPDs and some voltage attenuation. There was no clear ictal evolution to the LPD pattern on the right. This was indicative of an ongoing focal epileptogenic disturbance on the right, consistent with the patient's history of a lesion in that area. The study remained stable over the past 48 hours.

## 2022-01-21 NOTE — Progress Notes (Addendum)
ANTICOAGULATION CONSULT NOTE - Follow Up Consult  Pharmacy Consult for Heparin Indication:  History of PE (9/23)  Allergies  Allergen Reactions   Rifapentine Other (See Comments)    Flu-like symptoms   Amoxicillin Rash   Clavulanic Acid Rash    Patient Measurements: Height: '5\' 3"'$  (160 cm) Weight: 77 kg (169 lb 12.1 oz) IBW/kg (Calculated) : 52.4 Heparin Dosing Weight: 68 kg  Vital Signs: Temp: 98.6 F (37 C) (12/06 1200) Temp Source: Axillary (12/06 1200) BP: 120/76 (12/06 1300) Pulse Rate: 91 (12/06 1300)  Labs: Recent Labs    01/19/22 0456 01/19/22 0658 01/19/22 1700 01/20/22 0444 01/20/22 1614 01/21/22 0410 01/21/22 1246  HGB  --   --   --  12.9  --  14.3  --   HCT  --   --   --  38.3  --  43.3  --   PLT  --   --   --  175  --  169  --   APTT 154* 149*   < > 62* 78* 54* 66*  HEPARINUNFRC  --  >1.10*  --  1.05*  --  0.92*  --   CREATININE 0.47  --   --  0.45  --   --   --    < > = values in this interval not displayed.     Estimated Creatinine Clearance: 67 mL/min (by C-G formula based on SCr of 0.45 mg/dL).  Assessment: 66 YO female presented with concern for focal seizure and status epilepticus. On Eliquis at home for history of PE in September 2023.  Pharmacy consulted to dose IV heparin while Eliquis is on hold.  aPTT now therapeutic at low end of range after rate increase this morning at 66. No bleeding or issues with infusion per discussion with RN.  Goal of Therapy:  Heparin level 0.3-0.7 units/ml aPTT 66-102 seconds Monitor platelets by anticoagulation protocol: Yes   Plan:  Increase heparin infusion slightly to 900 units/hr to ensure stays in range Monitor daily CBC, aPTT and heparin levels, and s/sx of bleeding.    Arturo Morton, PharmD, BCPS Please check AMION for all [place] contact numbers Clinical Pharmacist 01/21/2022 2:53 PM

## 2022-01-21 NOTE — Progress Notes (Signed)
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  Had progressive decline at home related to her seizures. Lateral to [facility] ED.  She was admitted to [facility] with neuro consult. Keppra loaded. MRI brain had an acute change of abnormal diffusion in lateral R perirolandic cortex. cEEG with focal epileptogenicity.    On 12/3 despite escalation of AEDs, still having evidence of focal seizures. Pt remains very somnolent. With need for AED escalation and pts current mental status, PCCM is consulted for elective intubation.    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 Keppra loaded., neuro consulted admit to [facility] 12/3 need for AED escalation. Mentation concerning for airway protection in CNS depressing meds increased. PCCM consulted for elective intubation.  12/4 further uptitration of AEDs. Remaining intubated. Adding mIVF  12/5 continues to have epileptogenic disturbance on eeg , weaned on MV  Interim History / Subjective:  Per Night RN, attempted to decrease sedation but had to titrate back d/t biting on ETT. No overnight seizures reportedly seen.    Objective   Blood pressure 137/89, pulse 95, temperature 98.1 F (36.7 C), temperature source Axillary, resp. rate 19, height '5\' 3"'$  (1.6 m), weight 77 kg, SpO2 99 %.    Vent Mode: PRVC FiO2 (%):  [40 %] 40 % Set Rate:  [17 bmp] 17 bmp Vt Set:  [420 mL] 420 mL PEEP:  [5 cmH20] 5  cmH20 Pressure Support:  [10 cmH20] 10 cmH20 Plateau Pressure:  [15 cmH20-18 cmH20] 18 cmH20   Intake/Output Summary (Last 24 hours) at 01/21/2022 0725 Last data filed at 01/21/2022 0600 Gross per 24 hour  Intake 2802.29 ml  Output 1575 ml  Net 1227.29 ml   Filed Weights   01/18/22 1857 01/19/22 0500 01/20/22 0500  Weight: 72.9 kg 75 kg 77 kg   Physical Examination: General: Chronically ill-appearing elderly female in NAD. Sedated and Intubated  HEENT: [place]/AT, anicteric sclera, PERRL, moist mucous membranes. EEG in place Neuro: Sedated. Withdraws to pain in all extremities. +Corneal, +Cough, and +Gag  CV: RRR, no m/g/r. PULM: Breathing even and unlabored on MV. Lung fields Clear/Diminished. GI: Soft, nontender, nondistended. Normoactive bowel sounds. Extremities: No obvious deformities Skin: Warm/dry, intact.   Resolved Hospital Problem list   hypoK hypomag  Assessment & Plan:   Focal SE with hx of seizure disorder Acute encephalopathy in setting of above Hx pleomorphic xanthroastrocytoma s/p crani (2015, 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 Onfi up titrated 12/5 & is on keppra -cont cEEG -cont decadron  -on prop (and PRN fent) for PAD -PRN BZD available  Acute respiratory failure / need for MV in for airway protection in setting of SE P -Cont MV support, wean as able   -lung protective ventilation -PRN CXR -VAP, pulm hygiene   HTN HLD -atorvastatin -holding home antihypertensives. With sedation needed  for ETT her BP is currently appropriate without additional agents. Will add if needed  Hx PE on home eliquis  -hep gtt per pharmD   Inadequate PO intake  -EN per RDN  Hyperglycemia Likely secondary to TF and Steroids - SSI - CBGs Q4H - Goal CBG 140-180  Best Practice (right click and "Reselect all SmartList Selections" daily)   Diet/type: tubefeeds and NPO DVT prophylaxis:  systemic heparin GI prophylaxis: PPI Lines: N/A Foley:  N/A Code Status:  limited Last date of multidisciplinary goals of care discussion [12/3 neuro d/w family prior to ICU admission] Neuro discussed code status and elective intubation: temporary intubation for seizure control but she would not want CPR or extended life supports  CRITICAL CARE  Performed by: Erma Heritage, NP-S  I have reviewed the above assessment and plan w/ MR Donzetta Matters  My exam  Pt sedated on prop gtt HENT NCAT no JVD LTM electrodes are in placePupils equal and reactive Neuro sedated on prop. + cough. No w/d to pain  Pulm clear currently on full vent support Card rrr Abd soft Ext warm and dry GU cl yellow  Principal Problem:   Status epilepticus (HCC) Active Problems:   Focal seizure ([place])   Intracranial tumor ([place])   Acute respiratory failure (HCC)   HTN (hypertension)   Hyperlipidemia   Pulmonary embolism (HCC)   Gastroesophageal reflux disease without esophagitis   Hypokalemia   Hypomagnesemia   Hypoglycemia   History of pulmonary embolism   Oliguria  Discussion Still on LTM w/ most recent dose adjustments of AEDs on 12/5 for R LPDs.Mental status will not support extubation w/ current prop dosing.   Plan Cont full vent support w/ PSV as tolerated; once OK w/ neuro we can wean prop down and assess for SBT Cont VAP bundle  PAD protocol dictated by EEG at this point but eventually transition to goal RASS 0 AEDs per neuro  Have added SSI as BGs trending up  Total critical care time: Davey ACNP-BC [place] Pager # [phone] OR # [phone] if no answer

## 2022-01-21 NOTE — Progress Notes (Signed)
vLTM maintenance  all impedances below 10kohms. No skin breakdown noted at [place]  FP2  F7 F8  FZ

## 2022-01-21 NOTE — Progress Notes (Signed)
LTM maint complete - no skin breakdown under: Fp1 Fp2 Serviced O1 [facility] monitored, Event button test confirmed by [facility].

## 2022-01-21 NOTE — Progress Notes (Signed)
ANTICOAGULATION CONSULT NOTE  Pharmacy Consult for Heparin  Indication:  history of PE  Brief A/P: aPTT subtherapeutic Increase Heparin rate  Allergies  Allergen Reactions   Rifapentine Other (See Comments)    Flu-like symptoms   Amoxicillin Rash   Clavulanic Acid Rash    Patient Measurements: Height: '5\' 3"'$  (160 cm) Weight: 77 kg (169 lb 12.1 oz) IBW/kg (Calculated) : 52.4 Heparin Dosing Weight: 68 kg  Vital Signs: Temp: 98.1 F (36.7 C) (12/06 0400) Temp Source: Axillary (12/06 0400) BP: 154/102 (12/06 0400) Pulse Rate: 105 (12/06 0400)  Labs: Recent Labs     0000 01/18/22 0720 01/18/22 1413 01/19/22 0456 01/19/22 0456 01/19/22 0658 01/19/22 1700 01/20/22 0444 01/20/22 1614 01/21/22 0410  HGB   < >  --  12.9  --   --   --   --  12.9  --  14.3  HCT  --   --  38.0  --   --   --   --  38.3  --  43.3  PLT  --   --   --   --   --   --   --  175  --  169  APTT  --   --   --  154*   < > 149*   < > 62* 78* 54*  HEPARINUNFRC  --   --   --   --   --  >1.10*  --  1.05*  --  0.92*  CREATININE  --  0.47  --  0.47  --   --   --  0.45  --   --    < > = values in this interval not displayed.     Estimated Creatinine Clearance: 67 mL/min (by C-G formula based on SCr of 0.45 mg/dL).  Assessment: 66 y.o. female with h/o PE, Eliquis on hold, for heparin  Goal of Therapy:  Heparin level 0.3-0.7 units/ml APTT 66-102 seconds Monitor platelets by anticoagulation protocol: Yes   Plan:  Increase Heparin 850 units/hr  Phillis Knack, PharmD, BCPS  01/21/2022, 5:45 AM

## 2022-01-22 DIAGNOSIS — G40901 Epilepsy, unspecified, not intractable, with status epilepticus: Secondary | ICD-10-CM | POA: Diagnosis not present

## 2022-01-22 LAB — CBC
HCT: 39.3 % (ref 36.0–46.0)
Hemoglobin: 12.7 g/dL (ref 12.0–15.0)
MCH: 29.7 pg (ref 26.0–34.0)
MCHC: 32.3 g/dL (ref 30.0–36.0)
MCV: 92 fL (ref 80.0–100.0)
Platelets: 203 10*3/uL (ref 150–400)
RBC: 4.27 MIL/uL (ref 3.87–5.11)
RDW: 14 % (ref 11.5–15.5)
WBC: 9.4 10*3/uL (ref 4.0–10.5)
nRBC: 0 % (ref 0.0–0.2)

## 2022-01-22 LAB — HEPARIN LEVEL (UNFRACTIONATED): Heparin Unfractionated: >1.1 IU/mL — ABNORMAL HIGH (ref 0.30–0.70)

## 2022-01-22 LAB — GLUCOSE, CAPILLARY
Glucose-Capillary: 189 mg/dL — ABNORMAL HIGH (ref 70–99)
Glucose-Capillary: 128 mg/dL — ABNORMAL HIGH (ref 70–99)
Glucose-Capillary: 136 mg/dL — ABNORMAL HIGH (ref 70–99)
Glucose-Capillary: 163 mg/dL — ABNORMAL HIGH (ref 70–99)
Glucose-Capillary: 133 mg/dL — ABNORMAL HIGH (ref 70–99)
Glucose-Capillary: 146 mg/dL — ABNORMAL HIGH (ref 70–99)

## 2022-01-22 LAB — TRIGLYCERIDES: Triglycerides: 195 mg/dL — ABNORMAL HIGH (ref <150)

## 2022-01-22 LAB — APTT: aPTT: 78 seconds — ABNORMAL HIGH (ref 24–36)

## 2022-01-22 LAB — MAGNESIUM: Magnesium: 1.9 mg/dL (ref 1.7–2.4)

## 2022-01-22 LAB — PHOSPHORUS: Phosphorus: 4 mg/dL (ref 2.5–4.6)

## 2022-01-22 MED ORDER — PROSOURCE TF20 ENFIT COMPATIBL EN LIQD
60.0000 mL | Freq: Every day | ENTERAL | Status: DC
  Administered 2022-01-24 – 2022-01-26 (×3): 60 mL
  Filled 2022-01-22 (×3): qty 60

## 2022-01-22 MED ORDER — INSULIN GLARGINE-YFGN 100 UNIT/ML ~~LOC~~ SOLN
5.0000 [IU] | Freq: Two times a day (BID) | SUBCUTANEOUS | Status: DC
  Administered 2022-01-22 – 2022-01-29 (×13): 5 [IU] via SUBCUTANEOUS
  Filled 2022-01-22 (×17): qty 0.05

## 2022-01-22 MED ORDER — VITAL 1.5 CAL PO LIQD
1000.0000 mL | ORAL | Status: DC
  Administered 2022-01-22 – 2022-01-23 (×2): 1000 mL

## 2022-01-22 NOTE — Progress Notes (Signed)
Neurology Progress Note   Subjective: - patient intubated, weaning sedation  EEG 12/6-12/7: This was an abnormal continuous video EEG due to improving right central LPDs, indicative of a stable focal lesion on the right with some epileptogenic potential. No seizures were seen.   EEG 12/5-12/6: persistent R LPDs, intermittent suppression 2/2 medication effect, no evolution to seizures EEG 12/4-12/5: persistent R LPDs, intermittent suppression 2/2 medication effect, no evolution to seizures EEG 12/3-12/4: persistent R LPDs, intermittent suppression 2/2 medication effect, no evolution to seizures EEG 12/2-12/3: right central spiky LPDs with subtle ictal evolution, indicative of ongoing focal epileptogenicity in that region.    Exam: Current vital signs: BP (!) 155/93   Pulse (!) 114   Temp 99 F (37.2 C) (Axillary)   Resp (!) 21   Ht '5\' 3"'$  (1.6 m)   Wt 77 kg   SpO2 99%   BMI 30.07 kg/m  Vital signs in last 24 hours: Temp:  [97.9 F (36.6 C)-99 F (37.2 C)] 99 F (37.2 C) (12/07 1600) Pulse Rate:  [74-124] 114 (12/07 1700) Resp:  [15-26] 21 (12/07 1700) BP: (96-157)/(55-106) 155/93 (12/07 1700) SpO2:  [98 %-100 %] 99 % (12/07 1700) FiO2 (%):  [40 %] 40 % (12/07 1330)  Exam on propofol   Gen: In bed, intubated, sedated Resp: ventilated Cardiac: Perfusing extremities well  Abd: soft, nt  Neuro: MS: opens eyes to voice, follows commands to blink eyes and stick out tongue CN: pupils 46m ERRL, (+) corneals, oculocephalics, cough, gag Motor & sensory: spontaneous movement on R but not L  Pertinent Labs:  Basic Metabolic Panel: Recent Labs  Lab 01/13/22 1359 01/17/22 1413 01/17/22 1415 01/18/22 0720  NA 139  --  142 141  K 3.3*  --  2.3* 3.7  CL 104  --  104 112*  CO2 17*  --  27 22  GLUCOSE 142*  --  106* 128*  BUN 6*  --  13 5*  CREATININE 0.68  --  0.49 0.47  CALCIUM 9.5  --  9.4 8.2*  MG  --  1.6*  --   --     CBC: Recent Labs  Lab 01/17/22 1033  01/18/22 0405 01/18/22 1413 01/20/22 0444 01/21/22 0410 01/22/22 0653  WBC 9.1 4.2  --  7.9 10.4 9.4  NEUTROABS 6.6  --   --   --   --   --   HGB 14.1 10.6* 12.9 12.9 14.3 12.7  HCT 44.5 31.4* 38.0 38.3 43.3 39.3  MCV 92.3 90.2  --  91.2 91.5 92.0  PLT 245 174  --  175 169 203     Coagulation Studies: No results for input(s): "LABPROT", "INR" in the last 72 hours.    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 Status resolved after intubation and sedation with propofol and versed although she still has R LPDs. Vimpat d/c'd 2/2 LBBB on EKG.  Recommendations: - Continue long-term EEG monitoring - Continue keppra 1000 mg BID (renal dosing) - Continue onfi 7.'5mg'$  bid - Please continue to wean propofol to off. If no electrographic seizures after sedation is weaned LTM can be discontinued. D/w Dr. BKary Kos- Neurology will continue to follow  Estimated Creatinine Clearance: 67 mL/min (by C-G formula based on SCr of 0.45 mg/dL).   CrCl 80 to 130 mL/minute/1.73 m2: 500 mg to 1.5 g every 12 hours.  CrCl 50 to <80 mL/minute/1.73  m2: 500 mg to 1 g every 12 hours.  CrCl 30 to <50 mL/minute/1.73 m2: 250 to 750 mg every 12 hours.  CrCl 15 to <30 mL/minute/1.73 m2: 250 to 500 mg every 12 hours.  CrCl <15 mL/minute/1.73 m2: 250 to 500 mg every 24 hours (expert opinion).    This patient is critically ill and at significant risk of neurological worsening, death and care requires constant monitoring of vital signs, hemodynamics,respiratory and cardiac monitoring, neurological assessment, discussion with family, other specialists and medical decision making of high complexity. I spent 45 minutes of neurocritical care time  in the care of  this patient. This was time spent independent of any time provided by nurse practitioner or PA.  Su Monks, MD [facility] [phone]  If 7pm- 7am, please page neurology on call as listed in [place].

## 2022-01-22 NOTE — Progress Notes (Signed)
LTM maint complete - no skin breakdown Fp1 F3 [facility] monitored, Event button test confirmed by [facility].

## 2022-01-22 NOTE — Procedures (Signed)
EEG Procedure CPT/Type of Study: 32951; 24hr EEG with video Referring Provider: Avon Gully Primary Neurological Diagnosis: seizures   History: This is a 66 yr old patient, undergoing an EEG to evaluate for seizures, status epilepticus. Clinical State: disoriented   Technical Description:   The EEG was performed using standard setting per the guidelines of American Clinical Neurophysiology Society (ACNS).   A minimum of 21 electrodes were placed on scalp according to the International 10-20 or/and 10-10 Systems. Supplemental electrodes were placed as needed. Single EKG electrode was also used to detect cardiac arrhythmia. Patient's behavior was continuously recorded on video simultaneously with EEG. A minimum of 16 channels were used for data display. Each epoch of study was reviewed manually daily and as needed using standard referential and bipolar montages. Computerized quantitative EEG analysis (such as compressed spectral array analysis, trending, automated spike & seizure detection) were used as indicated.    Day 5: from 0730 01/21/22 to 0730 01/22/22   EEG Description: Overall Amplitude:Normal Predominant Frequency: The background activity showed delta/theta slowing  Superimposed Frequencies: sparse beta activity, decreased on the right The background was asymmetric, decreased fast on the right   Background Abnormalities: focal slowing, right centro-temporal focal delta slowing Rhythmic or periodic pattern: Yes; LPDs in the right centro-temporal region occurring frequently, 0.5-'1Hz'$  frequency, improved morphology Epileptiform activity: Yes; right central spiky morphology without clear ictal evolution Electrographic seizures: No Events: no    Breach rhythm: no   Reactivity: Present   Stimulation procedures:  Hyperventilation: not done Photic stimulation: no change   Sleep Background: Stage II   EKG: irregular rhythm   Impression: This was an abnormal continuous video EEG  due to improving right central LPDs, indicative of a stable focal lesion on the right with some epileptogenic potential. No seizures were seen.

## 2022-01-22 NOTE — Progress Notes (Signed)
Nutrition Follow-up  DOCUMENTATION CODES:   Not applicable  INTERVENTION:   Tube feeding via OG tube: Increase Vital 1.5 to 55 ml/h (1320 ml per day) Prosource TF20 60 ml daily  Provides 2060 kcal, 109 gm protein, 1003 ml free water daily   NUTRITION DIAGNOSIS:   Inadequate oral intake related to other (see comment) (postictal, intubation) as evidenced by NPO status. Ongoing.   GOAL:   Patient will meet greater than or equal to 90% of their needs Met with TF at goal.   MONITOR:   Vent status, TF tolerance  REASON FOR ASSESSMENT:   Consult Enteral/tube feeding initiation and management  ASSESSMENT:   Pt is a 66yo F with PMH of seasonal allergy, anal fissure, anxiety, depression, cataracts, elevated LFTs, hyperglycemia,HLD, HTN, osteoarthritis pars ephedra lumbar spine, spondylolisthesis of the lumbar spine, and partial seizures who presents with focal seizure an hour before arrival.  Pt discussed during ICU rounds and with RN.  Propofol being weaned, off EEG.   Medications reviewed and include: decadron, colace, SSI, 5 units semglee BID, protonix, miralax Propofol weaning  Labs reviewed:  CBG's: 125-189  5 F OG tube; tip in distal stomach vs proximal duodenum    Diet Order:   Diet Order             Diet NPO time specified  Diet effective now                   EDUCATION NEEDS:   Not appropriate for education at this time  Skin:  Skin Assessment: Reviewed RN Assessment  Last BM:  12/6  Height:   Ht Readings from Last 1 Encounters:  01/18/22 _0  (1.6 m)    Weight:   Wt Readings from Last 1 Encounters:  01/20/22 77 kg    BMI:  Body mass index is 30.07 kg/m.  Estimated Nutritional Needs:   Kcal:  1875-2250kcal  Protein:  90-115g  Fluid:  1875-2258m  Zayleigh Stroh P., RD, LDN, CNSC See AMiON for contact information

## 2022-01-22 NOTE — Progress Notes (Addendum)
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  Had progressive decline at home related to her seizures. Lateral to [facility] ED.  She was admitted to [facility] with neuro consult. Keppra loaded. MRI brain had an acute change of abnormal diffusion in lateral R perirolandic cortex. cEEG with focal epileptogenicity.    On 12/3 despite escalation of AEDs, still having evidence of focal seizures. Pt remains very somnolent. With need for AED escalation and pts current mental status, PCCM is consulted for elective intubation.    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 Keppra loaded., neuro consulted admit to [facility] 12/3 need for AED escalation. Mentation concerning for airway protection in CNS depressing meds increased. PCCM consulted for elective intubation.  12/4 further uptitration of AEDs. Remaining intubated. Adding mIVF  12/5 continues to have epileptogenic disturbance on eeg , weaned on MV  Interim History / Subjective:  No sig changes   Objective   Blood pressure 137/89, pulse 95, temperature 98.1 F (36.7 C), temperature source Axillary, resp. rate 19, height '5\' 3"'$  (1.6 m), weight 77 kg, SpO2 99 %.    Vent Mode: PRVC FiO2 (%):  [40 %] 40 % Set Rate:  [17 bmp] 17 bmp Vt Set:  [420 mL] 420 mL PEEP:  [5 cmH20] 5 cmH20 Pressure Support:  [10 cmH20] 10 cmH20 Plateau Pressure:  [15 cmH20-18 cmH20] 18 cmH20   Intake/Output  Summary (Last 24 hours) at 01/21/2022 0725 Last data filed at 01/21/2022 0600 Gross per 24 hour  Intake 2802.29 ml  Output 1575 ml  Net 1227.29 ml   Filed Weights   01/18/22 1857 01/19/22 0500 01/20/22 0500  Weight: 72.9 kg 75 kg 77 kg   Physical Examination: General sedated on prop and still on full vent support HENT NCAT PERRL orally intubated Neuro will localize w/ LUE, grimaces w/ pain, + cough, bites tube, PERRL Pulm dec bases no accessory use  Card rrr Abd soft Ext warm dependent edema   Resolved Hospital Problem list   hypoK hypomag  Assessment & Plan:   Focal SE with hx of seizure disorder Acute encephalopathy in setting of above Hx pleomorphic xanthroastrocytoma s/p crani (2015, 2022) and XRT  -still w evidence of focal sz 12/5 Plan Serial neuro checks LTM per neuro AEDs per neuro Cont prop infusion   Acute respiratory failure / need for MV in for airway protection in setting of SE Plan Cont full vent support cycling PSV BUT not working on extubation until sedation weaned to point can protect airway  VAP bundle  PAD protocol   HTN HLD Plan Cont statin  Hold antihypertensives for now   Hx PE on home eliquis  Plan Hep ggt per pharm   Inadequate PO intake  Plan tubefeeds  Hyperglycemia Drifting above goal  Plan Ssi Add Semglee Goal gluc 140-180  Best Practice (right click and "Reselect all SmartList Selections" daily)   Diet/type:  tubefeeds and NPO DVT prophylaxis: systemic heparin GI prophylaxis: PPI Lines: N/A Foley:  N/A Code Status:  limited Last date of multidisciplinary goals of care discussion [12/3 neuro d/w family prior to ICU admission] Neuro discussed code status and elective intubation: temporary intubation for seizure control but she would not want CPR or extended life supports   Total critical care time: 32 min   Erick Colace ACNP-BC [place] Pager # [phone] OR # [phone] if no answer

## 2022-01-22 NOTE — Progress Notes (Signed)
ANTICOAGULATION CONSULT NOTE - Follow Up Consult  Pharmacy Consult for Heparin Indication:  History of PE (9/23)  Allergies  Allergen Reactions   Rifapentine Other (See Comments)    Flu-like symptoms   Amoxicillin Rash   Clavulanic Acid Rash    Patient Measurements: Height: '5\' 3"'$  (160 cm) Weight: 77 kg (169 lb 12.1 oz) IBW/kg (Calculated) : 52.4 Heparin Dosing Weight: 68 kg  Vital Signs: Temp: 98 F (36.7 C) (12/07 0700) Temp Source: Oral (12/07 0700) BP: 132/81 (12/07 0801) Pulse Rate: 109 (12/07 0900)  Labs: Recent Labs    01/20/22 0444 01/20/22 1614 01/21/22 0410 01/21/22 1246 01/22/22 0653  HGB 12.9  --  14.3  --  12.7  HCT 38.3  --  43.3  --  39.3  PLT 175  --  169  --  203  APTT 62*   < > 54* 66* 78*  HEPARINUNFRC 1.05*  --  0.92*  --  >1.10*  CREATININE 0.45  --   --   --   --    < > = values in this interval not displayed.    Estimated Creatinine Clearance: 67 mL/min (by C-G formula based on SCr of 0.45 mg/dL).  Assessment: 66 YO female presented with concern for focal seizure and status epilepticus. On Eliquis at home for history of PE in September 2023.  Pharmacy consulted to dose IV heparin while Eliquis is on hold.  aPTT now therapeutic at 78 after increasing from 850 to 900 units/hr on 12/6. No bleeding or issues with infusion per discussion with RN.  Goal of Therapy:  Heparin level 0.3-0.7 units/ml aPTT 66-102 seconds Monitor platelets by anticoagulation protocol: Yes   Plan:  Continue heparin infusion at 900 units/hr Monitor daily CBC, aPTT and heparin levels, and s/sx of bleeding.    Angus Seller, PharmD Candidate Please check AMION for all [place] contact numbers Clinical Pharmacist 01/22/2022 9:09 AM

## 2022-01-22 NOTE — Progress Notes (Addendum)
Neurology Progress Note   Subjective: - patient intubated - sedation weaned down to propofol 30 without seizure recurrence  EEG 12/5-12/6: persistent R LPDs, intermittent suppression 2/2 medication effect, no evolution to seizures EEG 12/4-12/5: persistent R LPDs, intermittent suppression 2/2 medication effect, no evolution to seizures EEG 12/3-12/4: persistent R LPDs, intermittent suppression 2/2 medication effect, no evolution to seizures EEG 12/2-12/3: right central spiky LPDs with subtle ictal evolution, indicative of ongoing focal epileptogenicity in that region.    Exam: Current vital signs: BP (!) 155/93   Pulse (!) 114   Temp 99 F (37.2 C) (Axillary)   Resp (!) 21   Ht '5\' 3"'$  (1.6 m)   Wt 77 kg   SpO2 99%   BMI 30.07 kg/m  Vital signs in last 24 hours: Temp:  [97.9 F (36.6 C)-99 F (37.2 C)] 99 F (37.2 C) (12/07 1600) Pulse Rate:  [74-124] 114 (12/07 1700) Resp:  [15-26] 21 (12/07 1700) BP: (96-157)/(55-106) 155/93 (12/07 1700) SpO2:  [98 %-100 %] 99 % (12/07 1700) FiO2 (%):  [40 %] 40 % (12/07 1330)  Exam on propofol 25  Gen: In bed, intubated, sedated Resp: ventilated Cardiac: Perfusing extremities well  Abd: soft, nt  Neuro: MS: opens eyes to voice, does not follow commands CN: pupils 35m ERRL, (+) corneals, oculocephalics, cough, gag Motor & sensory: spontaneous movement on R but not L  Pertinent Labs:  Basic Metabolic Panel: Recent Labs  Lab 01/13/22 1359 01/17/22 1413 01/17/22 1415 01/18/22 0720  NA 139  --  142 141  K 3.3*  --  2.3* 3.7  CL 104  --  104 112*  CO2 17*  --  27 22  GLUCOSE 142*  --  106* 128*  BUN 6*  --  13 5*  CREATININE 0.68  --  0.49 0.47  CALCIUM 9.5  --  9.4 8.2*  MG  --  1.6*  --   --     CBC: Recent Labs  Lab 01/17/22 1033 01/18/22 0405 01/18/22 1413 01/20/22 0444 01/21/22 0410 01/22/22 0653  WBC 9.1 4.2  --  7.9 10.4 9.4  NEUTROABS 6.6  --   --   --   --   --   HGB 14.1 10.6* 12.9 12.9 14.3 12.7   HCT 44.5 31.4* 38.0 38.3 43.3 39.3  MCV 92.3 90.2  --  91.2 91.5 92.0  PLT 245 174  --  175 169 203     Coagulation Studies: No results for input(s): "LABPROT", "INR" in the last 72 hours.    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 Status resolved after intubation and sedation with propofol and versed although she still has R LPDs. Vimpat d/c'd 2/2 LBBB on EKG.  Recommendations: - Continue long-term EEG monitoring - Continue keppra 1000 mg BID (renal dosing) - Continue onfi 7.'5mg'$  bid - Please continue to wean propofol to off. If no electrographic seizures after sedation is weaned LTM can be discontinued. - Neurology will continue to follow  Estimated Creatinine Clearance: 67 mL/min (by C-G formula based on SCr of 0.45 mg/dL).   CrCl 80 to 130 mL/minute/1.73 m2: 500 mg to 1.5 g every 12 hours.  CrCl 50 to <80 mL/minute/1.73 m2: 500 mg to 1 g every 12 hours.  CrCl 30 to <50 mL/minute/1.73 m2: 250 to 750 mg every 12 hours.  CrCl 15 to <30 mL/minute/1.73 m2: 250 to 500 mg  every 12 hours.  CrCl <15 mL/minute/1.73 m2: 250 to 500 mg every 24 hours (expert opinion).    This patient is critically ill and at significant risk of neurological worsening, death and care requires constant monitoring of vital signs, hemodynamics,respiratory and cardiac monitoring, neurological assessment, discussion with family, other specialists and medical decision making of high complexity. I spent 60 minutes of neurocritical care time  in the care of  this patient. This was time spent independent of any time provided by nurse practitioner or PA.  Su Monks, MD [facility] [phone]  If 7pm- 7am, please page neurology on call as listed in [place].

## 2022-01-22 NOTE — Progress Notes (Signed)
vLTM maintenance  All impedances below 10kohms.  No skin breakdown noted at [place]  F7  F3

## 2022-01-23 ENCOUNTER — Inpatient Hospital Stay (HOSPITAL_COMMUNITY): Payer: BC Managed Care – PPO

## 2022-01-23 DIAGNOSIS — G40901 Epilepsy, unspecified, not intractable, with status epilepticus: Secondary | ICD-10-CM | POA: Diagnosis not present

## 2022-01-23 LAB — GLUCOSE, CAPILLARY
Glucose-Capillary: 116 mg/dL — ABNORMAL HIGH (ref 70–99)
Glucose-Capillary: 125 mg/dL — ABNORMAL HIGH (ref 70–99)
Glucose-Capillary: 125 mg/dL — ABNORMAL HIGH (ref 70–99)
Glucose-Capillary: 148 mg/dL — ABNORMAL HIGH (ref 70–99)
Glucose-Capillary: 154 mg/dL — ABNORMAL HIGH (ref 70–99)
Glucose-Capillary: 177 mg/dL — ABNORMAL HIGH (ref 70–99)

## 2022-01-23 LAB — HEPARIN LEVEL (UNFRACTIONATED): Heparin Unfractionated: 1.08 IU/mL — ABNORMAL HIGH (ref 0.30–0.70)

## 2022-01-23 LAB — CBC
HCT: 42.7 % (ref 36.0–46.0)
Hemoglobin: 13.9 g/dL (ref 12.0–15.0)
MCH: 29.8 pg (ref 26.0–34.0)
MCHC: 32.6 g/dL (ref 30.0–36.0)
MCV: 91.4 fL (ref 80.0–100.0)
Platelets: 268 10*3/uL (ref 150–400)
RBC: 4.67 MIL/uL (ref 3.87–5.11)
RDW: 14.1 % (ref 11.5–15.5)
WBC: 13.3 10*3/uL — ABNORMAL HIGH (ref 4.0–10.5)
nRBC: 0 % (ref 0.0–0.2)

## 2022-01-23 LAB — BASIC METABOLIC PANEL
Anion gap: 11 (ref 5–15)
BUN: 17 mg/dL (ref 8–23)
CO2: 24 mmol/L (ref 22–32)
Calcium: 9 mg/dL (ref 8.9–10.3)
Chloride: 103 mmol/L (ref 98–111)
Creatinine, Ser: 0.45 mg/dL (ref 0.44–1.00)
GFR, Estimated: >60 mL/min (ref >60)
Glucose, Bld: 179 mg/dL — ABNORMAL HIGH (ref 70–99)
Potassium: 4.3 mmol/L (ref 3.5–5.1)
Sodium: 138 mmol/L (ref 135–145)

## 2022-01-23 LAB — APTT: aPTT: 72 seconds — ABNORMAL HIGH (ref 24–36)

## 2022-01-23 MED ORDER — ORAL CARE MOUTH RINSE
15.0000 mL | OROMUCOSAL | Status: DC
  Administered 2022-01-23 – 2022-01-28 (×14): 15 mL via OROMUCOSAL

## 2022-01-23 MED ORDER — AYR SALINE NASAL NA GEL: 1.0000 | NASAL | Status: DC | PRN

## 2022-01-23 NOTE — Progress Notes (Signed)
Extubated earlier today Looking good. Still has left sided weakness Plan Swallow eval  Cont rx  Erick Colace ACNP-BC [place] Pager # [phone] OR # [phone] if no answer

## 2022-01-23 NOTE — Procedures (Signed)
Cortrak  Person Inserting Tube:  Maylon Peppers C, RD Tube Type:  Cortrak - 43 inches Tube Size:  10 Tube Location:  Left nare Secured by: Bridle Technique Used to Measure Tube Placement:  Marking at nare/corner of mouth Cortrak Secured At:  63 cm   Cortrak Tube Team Note:  Consult received to place a Cortrak feeding tube.   X-ray is required, abdominal x-ray has been ordered by the Cortrak team. Please confirm tube placement before using the Cortrak tube.   If the tube becomes dislodged please keep the tube and contact the Cortrak team at [email or web address] for replacement.  If after hours and replacement cannot be delayed, place a NG tube and confirm placement with an abdominal x-ray.    Lockie Pares., RD, LDN, CNSC See AMiON for contact information

## 2022-01-23 NOTE — Evaluation (Signed)
Clinical/Bedside Swallow Evaluation Patient Details  Name: Elmyra Banwart MRN: [id number] Date of Birth: 1955/06/27  Today's Date: 01/23/2022 Time: SLP Start Time (ACUTE ONLY): 1250 SLP Stop Time (ACUTE ONLY): 1310 SLP Time Calculation (min) (ACUTE ONLY): 20 min  Past Medical History:  Past Medical History:  Diagnosis Date   Allergy    Anal fissure    Anxiety    Cataract    Depression    Elevated LFTs    Hyperglycemia    Hyperlipidemia    Hypertension    Osteoarthritis    Pars defect of lumbar spine    Seizures (HCC)    Spondylolisthesis    lumbar   Past Surgical History:  Past Surgical History:  Procedure Laterality Date   cataract     CRANIOTOMY N/A 10/02/2013   Procedure: 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;  Surgeon: Hosie Spangle, MD;  Location: [facility];  Service: Neurosurgery;  Laterality: N/A;  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   CRANIOTOMY Right 11/29/2020   Procedure: Right Parietal craniotomy for tumor resection;  Surgeon: Ashok Pall, MD;  Location: [facility];  Service: Neurosurgery;  Laterality: Right;   HEEL SPUR SURGERY     HPI:  66 yo F admitted with seizures on 2/2, intubated 2/3-12/8.  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 CUrrent imaging shows Right temporal cystic lesion.    Assessment / Plan / Recommendation  Clinical Impression  Pt demonstrates signs of acute dysphagia following six day intubation. Pt is aphonic but communicating with gestures and whispers. She is alert and following commands. Her cough is weak and congested. Pt acknowledges throat pain. She is excited to try ice but when given ice she holds it in her mouth and when she eventually swallows there is weak congested coughing and grimacing. Repeated over several trials. Pt is not ready for oral intake yet. Will need more time to heal post extubation. Recommend  reassessment tomorrow am for  further PO trials. SLP Visit Diagnosis: Dysphagia, oropharyngeal phase (R13.12)    Aspiration Risk  Moderate aspiration risk    Diet Recommendation NPO        Other  Recommendations      Recommendations for follow up therapy are one component of a multi-disciplinary discharge planning process, led by the attending physician.  Recommendations may be updated based on patient status, additional functional criteria and insurance authorization.  Follow up Recommendations Acute inpatient rehab (3hours/day)      Assistance Recommended at Discharge    Functional Status Assessment    Frequency and Duration min 2x/week  2 weeks       Prognosis Prognosis for Safe Diet Advancement: Good      Swallow Study   General HPI: 66 yo F admitted with seizures on 2/2, intubated 2/3-12/8.  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 CUrrent imaging shows Right temporal cystic lesion. Type of Study: Bedside Swallow Evaluation Previous Swallow Assessment: none Diet Prior to this Study: NPO Temperature Spikes Noted: No Respiratory Status: Nasal cannula History of Recent Intubation: Yes Length of Intubations (days): 6 days Date extubated: 01/23/22 Behavior/Cognition: Alert;Cooperative;Pleasant mood Oral Cavity Assessment: Within Functional Limits Oral Care Completed by SLP: No Oral Cavity - Dentition: Adequate natural dentition Vision: Functional for self-feeding Self-Feeding Abilities: Able to feed self Patient Positioning: Upright in bed Baseline Vocal Quality: Aphonic Volitional Cough: Weak;Congested Volitional Swallow:  Able to elicit    Oral/Motor/Sensory Function     Ice Chips Ice chips: Impaired Presentation: Spoon Oral Phase Functional Implications: Oral holding;Prolonged oral transit Pharyngeal Phase Impairments: Cough - Immediate   Thin Liquid Thin Liquid: Not tested    Nectar Thick Nectar Thick Liquid: Not tested    Honey Thick Honey Thick Liquid: Not tested   Puree Puree: Not tested   Solid     Solid: Not tested      Gustav Knueppel, Katherene Ponto 01/23/2022,1:54 PM

## 2022-01-23 NOTE — Procedures (Signed)
EEG Procedure CPT/Type of Study: 48889; 24hr EEG with video Referring Provider: Avon Gully Primary Neurological Diagnosis: seizures   History: This is a 66 yr old patient, undergoing an EEG to evaluate for seizures, status epilepticus. Clinical State: disoriented   Technical Description:   The EEG was performed using standard setting per the guidelines of American Clinical Neurophysiology Society (ACNS).   A minimum of 21 electrodes were placed on scalp according to the International 10-20 or/and 10-10 Systems. Supplemental electrodes were placed as needed. Single EKG electrode was also used to detect cardiac arrhythmia. Patient's behavior was continuously recorded on video simultaneously with EEG. A minimum of 16 channels were used for data display. Each epoch of study was reviewed manually daily and as needed using standard referential and bipolar montages. Computerized quantitative EEG analysis (such as compressed spectral array analysis, trending, automated spike & seizure detection) were used as indicated.    Day 6: from 0730 01/22/22 to 0730 01/23/22   EEG Description: Overall Amplitude:Normal Predominant Frequency: The background activity showed delta/theta slowing with emergence of a '9Hz'$  posterior basic rhythm at times Superimposed Frequencies: sparse beta activity, decreased on the right The background was asymmetric, decreased fast on the right   Background Abnormalities: focal slowing, right centro-temporal focal delta slowing Rhythmic or periodic pattern: Yes; LPDs in the right centro-temporal region occurring frequently, 0.5-'1Hz'$  frequency, stable morphology Epileptiform activity: Yes; right central spiky morphology without clear ictal evolution Electrographic seizures: No Events: no    Breach rhythm: no   Reactivity: Present   Stimulation procedures:  Hyperventilation: not done Photic stimulation: no change   Sleep Background: Stage II   EKG: irregular rhythm    Impression: This was an abnormal continuous video EEG due to improving right central LPDs and waking background, indicative of a focal lesion on the right with some epileptogenic potential. No seizures were seen.

## 2022-01-23 NOTE — Procedures (Signed)
Extubation Procedure Note  Patient Details:   Name: Lindsey Daniels DOB: 1955-09-02 MRN: [id number]   Airway Documentation:    Vent end date: 01/23/22 Vent end time: 0800   Evaluation  O2 sats: stable throughout Complications: No apparent complications Patient did tolerate procedure well. Bilateral Breath Sounds: Clear, Diminished   Yes  Pt extubated to 2L [place], pt tolerated well. Cuff leak present, no stridor noted, RN at bedside, RT will monitor.    Carren Rang 01/23/2022, 8:27 AM

## 2022-01-23 NOTE — Progress Notes (Signed)
SLP Cancellation Note  Patient Details Name: Lindsey Daniels MRN: [id number] DOB: 07/01/1955   Cancelled treatment:       Reason Eval/Treat Not Completed: Patient not medically ready. Per CCM defer swallow eval until tomorrow. Discussed with RN. SLP will f/u as needed.    Lourdez Mcgahan, Katherene Ponto 01/23/2022, 11:12 AM

## 2022-01-23 NOTE — Progress Notes (Signed)
ANTICOAGULATION CONSULT NOTE - Follow Up Consult  Pharmacy Consult for Heparin Indication:  History of PE (9/23)  Allergies  Allergen Reactions   Rifapentine Other (See Comments)    Flu-like symptoms   Amoxicillin Rash   Clavulanic Acid Rash    Patient Measurements: Height: '5\' 3"'$  (160 cm) Weight: 77 kg (169 lb 12.1 oz) IBW/kg (Calculated) : 52.4 Heparin Dosing Weight: 68 kg  Vital Signs: Temp: 99.7 F (37.6 C) (12/08 0400) Temp Source: Axillary (12/08 0400) BP: 120/77 (12/08 0500) Pulse Rate: 87 (12/08 0500)  Labs: Recent Labs    01/21/22 0410 01/21/22 1246 01/22/22 0653 01/23/22 0532  HGB 14.3  --  12.7 13.9  HCT 43.3  --  39.3 42.7  PLT 169  --  203 268  APTT 54* 66* 78* 72*  HEPARINUNFRC 0.92*  --  >1.10* 1.08*  CREATININE  --   --   --  0.45    Estimated Creatinine Clearance: 67 mL/min (by C-G formula based on SCr of 0.45 mg/dL).  Assessment: 66 YO female presented with concern for focal seizure and status epilepticus. On Eliquis at home for history of PE in September 2023.  Pharmacy consulted to dose IV heparin while Eliquis is on hold. Heparin increased from 850 to 900 units/hr on 12/6.  aPTT therapeutic at 72 on 900 units/hr. No bleeding or issues with infusion per discussion with RN.  Goal of Therapy:  Heparin level 0.3-0.7 units/ml aPTT 66-102 seconds Monitor platelets by anticoagulation protocol: Yes   Plan:  Continue heparin infusion at 900 units/hr Monitor daily CBC, aPTT and heparin levels, and s/sx of bleeding.    Angus Seller, PharmD Candidate Please check AMION for all [place] contact numbers 01/23/2022 8:29 AM

## 2022-01-23 NOTE — Progress Notes (Signed)
Neurology Progress Note   Subjective: - sedation weaned, patient extubated, can say her name and follow commands on the R  EEG 12/7-12/8: This was an abnormal continuous video EEG due to improving right central LPDs and waking background, indicative of a focal lesion on the right with some epileptogenic potential. No seizures were seen.  EEG 12/6-12/7: This was an abnormal continuous video EEG due to improving right central LPDs, indicative of a stable focal lesion on the right with some epileptogenic potential. No seizures were seen.   EEG 12/5-12/6: persistent R LPDs, intermittent suppression 2/2 medication effect, no evolution to seizures EEG 12/4-12/5: persistent R LPDs, intermittent suppression 2/2 medication effect, no evolution to seizures EEG 12/3-12/4: persistent R LPDs, intermittent suppression 2/2 medication effect, no evolution to seizures EEG 12/2-12/3: right central spiky LPDs with subtle ictal evolution, indicative of ongoing focal epileptogenicity in that region.    Exam: Current vital signs: BP (!) 155/93   Pulse (!) 114   Temp 99 F (37.2 C) (Axillary)   Resp (!) 21   Ht '5\' 3"'$  (1.6 m)   Wt 77 kg   SpO2 99%   BMI 30.07 kg/m  Vital signs in last 24 hours: Temp:  [97.9 F (36.6 C)-99 F (37.2 C)] 99 F (37.2 C) (12/07 1600) Pulse Rate:  [74-124] 114 (12/07 1700) Resp:  [15-26] 21 (12/07 1700) BP: (96-157)/(55-106) 155/93 (12/07 1700) SpO2:  [98 %-100 %] 99 % (12/07 1700) FiO2 (%):  [40 %] 40 % (12/07 1330)  Exam off sedation  Gen: In bed, NAD Resp: normal WOB Cardiac: Perfusing extremities well  Abd: soft, nt  Neuro: MS: opens eyes to voice, follows commands, oriented to self and hospital CN: pupils 67m ERRL, blinks to threat bilat, EOMI, L NLF flattening, hearing intact to voice Motor: spontaneous movement and movement to command on R but not L Sensory: SILT  Pertinent Labs:  Basic Metabolic Panel: Recent Labs  Lab 01/13/22 1359 01/17/22 1413  01/17/22 1415 01/18/22 0720  NA 139  --  142 141  K 3.3*  --  2.3* 3.7  CL 104  --  104 112*  CO2 17*  --  27 22  GLUCOSE 142*  --  106* 128*  BUN 6*  --  13 5*  CREATININE 0.68  --  0.49 0.47  CALCIUM 9.5  --  9.4 8.2*  MG  --  1.6*  --   --     CBC: Recent Labs  Lab 01/17/22 1033 01/18/22 0405 01/18/22 1413 01/20/22 0444 01/21/22 0410 01/22/22 0653  WBC 9.1 4.2  --  7.9 10.4 9.4  NEUTROABS 6.6  --   --   --   --   --   HGB 14.1 10.6* 12.9 12.9 14.3 12.7  HCT 44.5 31.4* 38.0 38.3 43.3 39.3  MCV 92.3 90.2  --  91.2 91.5 92.0  PLT 245 174  --  175 169 203     Coagulation Studies: No results for input(s): "LABPROT", "INR" in the last 72 hours.    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 Status resolved after intubation and sedation with propofol and versed although she still has R LPDs. Vimpat d/c'd 2/2 LBBB on EKG. She is now seizure-free x3 days, off sedation now and extubated.  Recommendations: - D/c LTM - Continue keppra 1000 mg BID (renal dosing) - Continue onfi 7.'5mg'$  bid - Neurology to sign  off, please re-engage if additional neurologic concerns arise.  Estimated Creatinine Clearance: 67 mL/min (by C-G formula based on SCr of 0.45 mg/dL).   CrCl 80 to 130 mL/minute/1.73 m2: 500 mg to 1.5 g every 12 hours.  CrCl 50 to <80 mL/minute/1.73 m2: 500 mg to 1 g every 12 hours.  CrCl 30 to <50 mL/minute/1.73 m2: 250 to 750 mg every 12 hours.  CrCl 15 to <30 mL/minute/1.73 m2: 250 to 500 mg every 12 hours.  CrCl <15 mL/minute/1.73 m2: 250 to 500 mg every 24 hours (expert opinion).  Su Monks, MD [facility] [phone]  If 7pm- 7am, please page neurology on call as listed in [place].

## 2022-01-23 NOTE — Progress Notes (Addendum)
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  Had progressive decline at home related to her seizures. Lateral to [facility] ED.  She was admitted to [facility] with neuro consult. Keppra loaded. MRI brain had an acute change of abnormal diffusion in lateral R perirolandic cortex. cEEG with focal epileptogenicity.    On 12/3 despite escalation of AEDs, still having evidence of focal seizures. Pt remains very somnolent. With need for AED escalation and pts current mental status, PCCM is consulted for elective intubation.    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 Keppra loaded., neuro consulted admit to [facility] 12/3 need for AED escalation. Mentation concerning for airway protection in CNS depressing meds increased. PCCM consulted for elective intubation.  12/4 further uptitration of AEDs. Remaining intubated. Adding mIVF  12/5 continues to have epileptogenic disturbance on eeg , weaned on MV 12/7 prop stopped. No seizure  12/8 weaned/extubated   Interim History / Subjective:  Looks good on SBT   Objective   Blood pressure 120/77, pulse 87, temperature 99.7 F (37.6 C), temperature source Axillary, resp. rate 20, height '5\' 3"'$  (1.6 m), weight 77 kg, SpO2 95 %.    Vent Mode: PRVC FiO2 (%):  [40 %-50 %] 40 % Set Rate:  [17 bmp] 17 bmp Vt Set:  [420 mL] 420 mL PEEP:  [5 cmH20] 5 cmH20 Pressure Support:  [10 cmH20] 10 cmH20 Plateau  Pressure:  [14 cmH20-18 cmH20] 14 cmH20   Intake/Output Summary (Last 24 hours) at 01/23/2022 0711 Last data filed at 01/23/2022 0500 Gross per 24 hour  Intake 1910.53 ml  Output 1400 ml  Net 510.53 ml   Filed Weights   01/18/22 1857 01/19/22 0500 01/20/22 0500  Weight: 72.9 kg 75 kg 77 kg   Physical Examination: General awake. Moving all ext purposeful HENT NCAT no JVD orally intubated Pulm clear no accessory use on SBT Volumes in 400s w/ favorable F/Vt.  Card rrr Abd soft Ext warm dry Neuro awake, I could get her to wiggle fingers to commands once but could not get her to do it again. She is moving everything except LUE. Reaching and playing w/ the IV pump. Tracks in room.  GU cl yellow  Resolved Hospital Problem list   hypoK hypomag  Assessment & Plan:   Focal SE with hx of seizure disorder Acute encephalopathy in setting of above Hx pleomorphic xanthroastrocytoma s/p crani (2015, 2022) and XRT  -still w evidence of focal sz 12/5 Plan Cont serial neuro checks Cont Keppra at '1000mg'$  bid Cont Onif at 7.5 mg bid Awaiting LTM official read if no further seizures dc LTM Decadron (home med) '4mg'$  bid (can prob change to oral if tol PO)  Acute respiratory failure / need for MV in for airway protection in setting of SE Plan Extubate Wean O2 Pulm hygiene  Pulse ox Spirometry when able Asp precautions will need SLP  HTN HLD  Plan Cont statin Holding antihypertensives   Hx PE on home eliquis  Plan Hep gtt per pharm    Inadequate PO intake  Plan tubefeeds  Hyperglycemia Drifting above goal  Plan Glucose goal 140-180 Cont current Semglee ssi   Best Practice (right click and "Reselect all SmartList Selections" daily)   Diet/type: tubefeeds and NPO DVT prophylaxis: systemic heparin GI prophylaxis: PPI Lines: N/A Foley:  N/A Code Status:  limited Last date of multidisciplinary goals of care discussion [12/3 neuro d/w family prior to ICU admission] Neuro  discussed code status and elective intubation: temporary intubation for seizure control but she would not want CPR or extended life supports   Total critical care time:32 min  Erick Colace ACNP-BC [place] Pager # [phone] OR # [phone] if no answer

## 2022-01-23 NOTE — Progress Notes (Signed)
EEG D/C'd. Patient had no skin breakdown. [facility] was notified.

## 2022-01-23 NOTE — Progress Notes (Signed)
EEG maintenance performed. No skin breakdown noted.  °

## 2022-01-24 ENCOUNTER — Inpatient Hospital Stay (HOSPITAL_COMMUNITY): Payer: BC Managed Care – PPO

## 2022-01-24 DIAGNOSIS — G40901 Epilepsy, unspecified, not intractable, with status epilepticus: Secondary | ICD-10-CM | POA: Diagnosis not present

## 2022-01-24 LAB — GLUCOSE, CAPILLARY
Glucose-Capillary: 177 mg/dL — ABNORMAL HIGH (ref 70–99)
Glucose-Capillary: 133 mg/dL — ABNORMAL HIGH (ref 70–99)
Glucose-Capillary: 170 mg/dL — ABNORMAL HIGH (ref 70–99)
Glucose-Capillary: 137 mg/dL — ABNORMAL HIGH (ref 70–99)
Glucose-Capillary: 141 mg/dL — ABNORMAL HIGH (ref 70–99)
Glucose-Capillary: 89 mg/dL (ref 70–99)

## 2022-01-24 LAB — CBC
HCT: 39.9 % (ref 36.0–46.0)
Hemoglobin: 13.2 g/dL (ref 12.0–15.0)
MCH: 29.7 pg (ref 26.0–34.0)
MCHC: 33.1 g/dL (ref 30.0–36.0)
MCV: 89.7 fL (ref 80.0–100.0)
Platelets: 246 10*3/uL (ref 150–400)
RBC: 4.45 MIL/uL (ref 3.87–5.11)
RDW: 14 % (ref 11.5–15.5)
WBC: 9.5 10*3/uL (ref 4.0–10.5)
nRBC: 0 % (ref 0.0–0.2)

## 2022-01-24 LAB — APTT: aPTT: 81 seconds — ABNORMAL HIGH (ref 24–36)

## 2022-01-24 LAB — HEPARIN LEVEL (UNFRACTIONATED): Heparin Unfractionated: 0.94 IU/mL — ABNORMAL HIGH (ref 0.30–0.70)

## 2022-01-24 MED ORDER — FUROSEMIDE 10 MG/ML IJ SOLN
40.0000 mg | Freq: Once | INTRAMUSCULAR | Status: AC
  Administered 2022-01-24: 40 mg via INTRAVENOUS
  Filled 2022-01-24: qty 4

## 2022-01-24 NOTE — Progress Notes (Signed)
Modified Barium Swallow Progress Note  Patient Details  Name: Lindsey Daniels MRN: [id number] Date of Birth: 07/13/1955  Today's Date: 01/24/2022  Modified Barium Swallow completed.  Full report located under Chart Review in the Imaging Section.  Brief recommendations include the following:  Clinical Impression  MBS was completed in the lateral position with barium impregnated thin liquids via spoon and cup, nectar thick liuqids via cup, pureed material and dual textured solids.   She presented with a mild oropharyngeal dysphagia.  Deficts were noted for the following:  lip closure. lingual motion, lingual control and laryngeal elevation.  These deficits led to anterior escape, and premature spillage.  No penetration or aspiration was noted despite patient coughing intermittently throughout evaluation.  Suggest beginning a dysphagia 1 pureed diet with thin liquids.  ST will follow up for therapeutic diet tolerance and to initiate swallow therapy.  She may benefit from ST follow up at the next level of care.  See below for information regarding swallowing physiology.      Oral Phase   -Decreased lingual motion leading to a decrease in A/P transport.   -Decreased labial seal leading to anterior escape below the level of the chin for thin and nectar thick liquids.   -Decreased lingual control leading to premature spillage particularly given dual textured solids.  Thin liquids were noted to resonate in the pyriform sinuses with all other textures resonating in the vallecula prior to the swallow trigger.    Pharyngeal Phase   -Decreased laryngeal elevation that was not causing any functional issues.    Esophageal Phase   -Sweep did not reveal any overt issues.   Swallow Evaluation Recommendations       SLP Diet Recommendations: Dysphagia 1 (Puree) solids;Thin liquid   Liquid Administration via: Cup   Medication Administration: Whole meds with puree   Supervision: Full assist for  feeding   Compensations: Minimize environmental distractions;Slow rate;Small sips/bites   Postural Changes: Remain semi-upright after after feeds/meals (Comment)   Oral Care Recommendations: Oral care BID       Shelly Flatten, MA, CCC-SLP Acute Rehab SLP [phone]  Lamar Sprinkles 01/24/2022,1:15 PM

## 2022-01-24 NOTE — Progress Notes (Signed)
Speech Language Pathology Dysphagia Treatment Patient Details Name: Lindsey Daniels MRN: [id number] DOB: 03/25/1955 Today's Date: 01/24/2022 Time: 2353-6144 SLP Time Calculation (min) (ACUTE ONLY): 12 min  Assessment / Plan / Recommendation Clinical Impression   ST follow up to assess for PO readiness.  Per RN patient is very soft spoken but appropriate for SLP visit.  Left side facial, labial and lingual weakness noted.  Vocal quality is aphonic.  She was presented with ice chips, thin liquids via spoon and pureed material.  Mild anterior escape was observed given pureed material that she was note sensate to.  Oral transit appeared functional for presented textures.  Delayed cough response was seen given thin liquids via spoon sips.  Given clinical presentation and prolonged intubation recommend that she remain NPO pending results of MBS to fully assess swallowing physiology and safety.    Diet Recommendation    NPO   SLP Plan MBS       General Behavior/Cognition: Alert Patient Positioning: Upright in bed Oral care provided: N/A HPI: 66 yo F admitted with seizures on 2/2, intubated 2/3-12/8.  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 CUrrent imaging shows Right temporal cystic lesion.  Oral Cavity - Oral Hygiene   Appeared adequate  Dysphagia Treatment Family/Caregiver Educated: No family present. Treatment Methods: Skilled observation;Differential diagnosis Patient observed directly with PO's: Yes Type of PO's observed: Dysphagia 1 (puree);Thin liquids Feeding: Total assist Liquids provided via: Teaspoon Oral Phase Signs & Symptoms: Anterior loss/spillage Pharyngeal Phase Signs & Symptoms: Delayed cough    Lamar Sprinkles 01/24/2022, 8:50 AM

## 2022-01-24 NOTE — Progress Notes (Signed)
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  Had progressive decline at home related to her seizures. Lateral to [facility] ED.  She was admitted to [facility] with neuro consult. Keppra loaded. MRI brain had an acute change of abnormal diffusion in lateral R perirolandic cortex. cEEG with focal epileptogenicity.    On 12/3 despite escalation of AEDs, still having evidence of focal seizures. Pt remains very somnolent. With need for AED escalation and pts current mental status, PCCM is consulted for elective intubation.    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 Keppra loaded., neuro consulted admit to [facility] 12/3 need for AED escalation. Mentation concerning for airway protection in CNS depressing meds increased. PCCM consulted for elective intubation.  12/4 further uptitration of AEDs. Remaining intubated. Adding mIVF  12/5 continues to have epileptogenic disturbance on eeg , weaned on MV 12/7 prop stopped. No seizure  12/8 weaned/extubated   Interim History / Subjective:  Extubated yesterday Good UOP with diuresis Scheduled for modified swallow  Objective   Blood pressure 134/86, pulse 81, temperature 98.8 F (37.1 C), temperature source Oral, resp. rate 19, height '5\' 3"'$  (1.6 m), weight 77 kg, SpO2 96 %.        Intake/Output Summary (Last 24 hours) at 01/24/2022 1107 Last data filed at 01/24/2022 1000 Gross per 24 hour   Intake 1670.58 ml  Output 2250 ml  Net -579.42 ml   Filed Weights   01/18/22 1857 01/19/22 0500 01/20/22 0500  Weight: 72.9 kg 75 kg 77 kg   Physical Exam: General: Chronically ill-appearing, no acute distress HENT: [place], AT, OP clear, MMM Eyes: EOMI, no scleral icterus Respiratory: Clear to auscultation bilaterally.  No crackles, wheezing or rales Cardiovascular: RRR, -M/R/G, no JVD GI: BS+, soft, nontender Extremities:-Edema,-tenderness Neuro: AAO x4, CNII-XII grossly intact, LUE weakness. Moves remaining extremities x 3  CBC reviewed wnl. Normalized WBC  Resolved Hospital Problem list   hypoK hypomag  Assessment & Plan:   Focal status epilepticus Hx seizure Hx pleomorphic xanthoastocytoma s/p crani 2015/2022 and XRT --Keppra and Onif  --Neuro checks --LTM discontinued. Neurology signed off --Decadron   Acute hypoxemic respiratory failure 2/2 above for airway protection --Extubated yesterday. On room air --Failed Speech. Will need enteral access --Lasix 40 mg once for goal net even/neg   Hx PE --Heparin  Inadequate PO intake  Plan --Modified swallow today  Best Practice (right click and "Reselect all SmartList Selections" daily)   Diet/type: tubefeeds and NPO DVT prophylaxis: systemic heparin GI prophylaxis: PPI Lines: N/A Foley:  N/A Code Status:  limited Last date of multidisciplinary goals of care discussion [12/3 neuro d/w family prior to ICU admission] Neuro discussed code status and elective intubation: temporary intubation for seizure control but she would not want CPR or extended life supports   Transfer to progressive. TRH to pick-up 12/10  Care Time: 50 min  Rodman Pickle, M.D. [facility] Pulmonary/Critical Care  Medicine 01/24/2022 11:07 AM   Please see Amion for pager number to reach on-call Pulmonary and Critical Care Team.

## 2022-01-24 NOTE — Progress Notes (Signed)
ANTICOAGULATION CONSULT NOTE - Follow Up Consult  Pharmacy Consult for Heparin Indication:  History of PE (9/23)  Allergies  Allergen Reactions   Rifapentine Other (See Comments)    Flu-like symptoms   Amoxicillin Rash   Clavulanic Acid Rash    Patient Measurements: Height: '5\' 3"'$  (160 cm) Weight: 77 kg (169 lb 12.1 oz) IBW/kg (Calculated) : 52.4 Heparin Dosing Weight: 68 kg  Vital Signs: Temp: 98.8 F (37.1 C) (12/09 0400) Temp Source: Oral (12/09 0400) BP: 169/99 (12/09 0800) Pulse Rate: 108 (12/09 0800)  Labs: Recent Labs    01/22/22 0653 01/23/22 0532 01/24/22 0337  HGB 12.7 13.9 13.2  HCT 39.3 42.7 39.9  PLT 203 268 246  APTT 78* 72* 81*  HEPARINUNFRC >1.10* 1.08* 0.94*  CREATININE  --  0.45  --     Estimated Creatinine Clearance: 67 mL/min (by C-G formula based on SCr of 0.45 mg/dL).  Assessment: 66 YO female presented with concern for focal seizure and status epilepticus. On Eliquis at home for history of PE in September 2023.  Pharmacy consulted to dose IV heparin while Eliquis is on hold.  aPTT therapeutic at 81 seconds on 900 units/hr. Heparin level still falsely elevated at 0.94 due to effects from Eliquis. No bleeding or issues with infusion per discussion with RN.  Goal of Therapy:  Heparin level 0.3-0.7 units/ml aPTT 66-102 seconds Monitor platelets by anticoagulation protocol: Yes   Plan:  Continue heparin infusion at 900 units/hr Monitor daily CBC, aPTT and heparin levels, and s/sx of bleeding.   Dimple Nanas, PharmD, BCPS 01/24/2022 9:25 AM

## 2022-01-24 NOTE — Evaluation (Signed)
Physical Therapy Evaluation Patient Details Name: Lindsey Daniels MRN: [id number] DOB: Aug 25, 1955 Today's Date: 01/24/2022  History of Present Illness  Pt is a 66 y/o female admitted 12/2 to ED via EMS due to having a focal seizure and hour before arrival.  In ED pt showed left sided facial and LUE twitch.  Pt is weak in the L UE at baseline.  PMHx:  crani for tumor x2, HTN, OA, seizures, spndylolisthesis  Clinical Impression  Pt admitted with/for seizure.  Pt is not at baseline function, likely not baseline mentation.  Pt needed mod to light max assist for basic mobility and transfers.  Gait was not safe to assess today. .  Pt currently limited functionally due to the problems listed. ( See problems list.)   Pt will benefit from PT to maximize function and safety in order to get ready for next venue listed below.        Recommendations for follow up therapy are one component of a multi-disciplinary discharge planning process, led by the attending physician.  Recommendations may be updated based on patient status, additional functional criteria and insurance authorization.  Follow Up Recommendations Acute inpatient rehab (3hours/day)      Assistance Recommended at Discharge Frequent or constant Supervision/Assistance  Patient can return home with the following  A little help with walking and/or transfers;A little help with bathing/dressing/bathroom;Assistance with cooking/housework;Assist for transportation;Help with stairs or ramp for entrance    Equipment Recommendations Other (comment) (shower seat,  TBD)  Recommendations for Other Services  Rehab consult    Functional Status Assessment Patient has had a recent decline in their functional status and demonstrates the ability to make significant improvements in function in a reasonable and predictable amount of time.     Precautions / Restrictions Precautions Precautions: Fall      Mobility  Bed Mobility Overal bed mobility:  Needs Assistance Bed Mobility: Supine to Sit, Sit to Supine     Supine to sit: Mod assist Sit to supine: Mod assist   General bed mobility comments: truncal assist up via L elbow.  LE assist/assist for initiation.  moderate cues and direction sit to side-lying with assist of LE's    Transfers Overall transfer level: Needs assistance   Transfers: Sit to/from Stand, Bed to chair/wheelchair/BSC Sit to Stand: Mod assist, +2 safety/equipment, +2 physical assistance   Step pivot transfers: Mod assist, Max assist       General transfer comment: pt needed cues for hand placement/direction, assist forward and with boost.  Stability in standing.    Ambulation/Gait               General Gait Details: Not safe enough today.  Stairs            Wheelchair Mobility    Modified Rankin (Stroke Patients Only)       Balance Overall balance assessment: Needs assistance Sitting-balance support: Single extremity supported, Bilateral upper extremity supported, Feet supported Sitting balance-Leahy Scale: Fair Sitting balance - Comments: no assist needed, but min guard for safety/pt impulsiveness   Standing balance support: Single extremity supported, Bilateral upper extremity supported                                 Pertinent Vitals/Pain Pain Assessment Pain Assessment: Faces Faces Pain Scale: No hurt Pain Intervention(s): Monitored during session    Home Living Family/patient expects to be discharged to:: Private residence Living Arrangements: Alone  Available Help at Discharge: Family;Friend(s);Neighbor Type of Home: House (townhome) Home Access: Stairs to enter Entrance Stairs-Rails: None Technical brewer of Steps: 1 small threshold   Home Layout: One level Home Equipment: Cane - single point;Grab bars - tub/shower;BSC/3in1 Additional Comments: pt reports multiple friends and brother can help. pt mentions, Oswaldo Conroy.    Prior  Function Prior Level of Function : Independent/Modified Independent                     Hand Dominance   Dominant Hand: Right    Extremity/Trunk Assessment   Upper Extremity Assessment Upper Extremity Assessment: RUE deficits/detail RUE Deficits / Details: generally weak, decrease coordination, decreased attension to the arm    Lower Extremity Assessment Lower Extremity Assessment: Generalized weakness    Cervical / Trunk Assessment Cervical / Trunk Assessment: Normal  Communication   Communication:  (soft spoken--difficult to hear)  Cognition Arousal/Alertness: Awake/alert Behavior During Therapy: Impulsive Overall Cognitive Status: Impaired/Different from baseline Area of Impairment: Following commands, Safety/judgement                       Following Commands: Follows one step commands with increased time Safety/Judgement: Decreased awareness of safety              General Comments General comments (skin integrity, edema, etc.): VSS    Exercises     Assessment/Plan    PT Assessment Patient needs continued PT services  PT Problem List Decreased strength;Decreased activity tolerance;Decreased balance;Decreased mobility;Decreased coordination;Decreased safety awareness       PT Treatment Interventions DME instruction;Gait training;Functional mobility training;Therapeutic activities;Balance training;Neuromuscular re-education;Patient/family education    PT Goals (Current goals can be found in the Care Plan section)  Acute Rehab PT Goals Patient Stated Goal: pt not focused on goals PT Goal Formulation: With patient Time For Goal Achievement: 02/07/22 Potential to Achieve Goals: Fair    Frequency Min 3X/week     Co-evaluation               AM-PAC PT "6 Clicks" Mobility  Outcome Measure Help needed turning from your back to your side while in a flat bed without using bedrails?: A Lot Help needed moving from lying on your back to  sitting on the side of a flat bed without using bedrails?: A Lot Help needed moving to and from a bed to a chair (including a wheelchair)?: A Lot Help needed standing up from a chair using your arms (e.g., wheelchair or bedside chair)?: A Lot Help needed to walk in hospital room?: Total Help needed climbing 3-5 steps with a railing? : Total 6 Click Score: 10    End of Session   Activity Tolerance: Patient tolerated treatment well;Patient limited by fatigue Patient left: in bed;with nursing/sitter in room Nurse Communication: Mobility status PT Visit Diagnosis: Other abnormalities of gait and mobility (R26.89);Muscle weakness (generalized) (M62.81);Other symptoms and signs involving the nervous system (R29.898)    Time: 1626-1700 PT Time Calculation (min) (ACUTE ONLY): 34 min   Charges:   PT Evaluation $PT Eval Moderate Complexity: 1 Mod PT Treatments $Therapeutic Activity: 8-22 mins        01/24/2022  Ginger Carne., PT Acute Rehabilitation Services [phone]  (office)  Tessie Fass [place] 01/24/2022, 5:19 PM

## 2022-01-25 DIAGNOSIS — G40901 Epilepsy, unspecified, not intractable, with status epilepticus: Secondary | ICD-10-CM | POA: Diagnosis not present

## 2022-01-25 DIAGNOSIS — I159 Secondary hypertension, unspecified: Secondary | ICD-10-CM | POA: Diagnosis not present

## 2022-01-25 DIAGNOSIS — J9601 Acute respiratory failure with hypoxia: Secondary | ICD-10-CM

## 2022-01-25 DIAGNOSIS — I2694 Multiple subsegmental pulmonary emboli without acute cor pulmonale: Secondary | ICD-10-CM

## 2022-01-25 DIAGNOSIS — R569 Unspecified convulsions: Secondary | ICD-10-CM | POA: Diagnosis not present

## 2022-01-25 LAB — GLUCOSE, CAPILLARY
Glucose-Capillary: 130 mg/dL — ABNORMAL HIGH (ref 70–99)
Glucose-Capillary: 101 mg/dL — ABNORMAL HIGH (ref 70–99)
Glucose-Capillary: 112 mg/dL — ABNORMAL HIGH (ref 70–99)
Glucose-Capillary: 172 mg/dL — ABNORMAL HIGH (ref 70–99)
Glucose-Capillary: 123 mg/dL — ABNORMAL HIGH (ref 70–99)

## 2022-01-25 LAB — HEPARIN LEVEL (UNFRACTIONATED)
Heparin Unfractionated: >1.1 IU/mL — ABNORMAL HIGH (ref 0.30–0.70)
Heparin Unfractionated: >1.1 IU/mL — ABNORMAL HIGH (ref 0.30–0.70)
Heparin Unfractionated: 0.83 IU/mL — ABNORMAL HIGH (ref 0.30–0.70)

## 2022-01-25 LAB — CBC
HCT: 42.5 % (ref 36.0–46.0)
Hemoglobin: 13.7 g/dL (ref 12.0–15.0)
MCH: 29.3 pg (ref 26.0–34.0)
MCHC: 32.2 g/dL (ref 30.0–36.0)
MCV: 90.8 fL (ref 80.0–100.0)
Platelets: 250 10*3/uL (ref 150–400)
RBC: 4.68 MIL/uL (ref 3.87–5.11)
RDW: 14.1 % (ref 11.5–15.5)
WBC: 11.5 10*3/uL — ABNORMAL HIGH (ref 4.0–10.5)
nRBC: 0 % (ref 0.0–0.2)

## 2022-01-25 LAB — APTT
aPTT: 107 seconds — ABNORMAL HIGH (ref 24–36)
aPTT: 113 seconds — ABNORMAL HIGH (ref 24–36)

## 2022-01-25 MED ORDER — HEPARIN (PORCINE) 25000 UT/250ML-% IV SOLN
800.0000 [IU]/h | INTRAVENOUS | Status: DC
  Administered 2022-01-25: 800 [IU]/h via INTRAVENOUS

## 2022-01-25 MED ORDER — SERTRALINE HCL 100 MG PO TABS
100.0000 mg | ORAL_TABLET | Freq: Every day | ORAL | Status: DC
  Administered 2022-01-25 – 2022-01-29 (×5): 100 mg via ORAL
  Filled 2022-01-25 (×5): qty 1

## 2022-01-25 MED ORDER — INSULIN ASPART 100 UNIT/ML IJ SOLN
0.0000 [IU] | Freq: Three times a day (TID) | INTRAMUSCULAR | Status: DC
  Administered 2022-01-25: 2 [IU] via SUBCUTANEOUS
  Administered 2022-01-25: 3 [IU] via SUBCUTANEOUS
  Administered 2022-01-26: 2 [IU] via SUBCUTANEOUS

## 2022-01-25 MED ORDER — HEPARIN (PORCINE) 25000 UT/250ML-% IV SOLN
600.0000 [IU]/h | INTRAVENOUS | Status: DC
  Administered 2022-01-26: 700 [IU]/h via INTRAVENOUS
  Administered 2022-01-28: 600 [IU]/h via INTRAVENOUS
  Filled 2022-01-25 (×2): qty 250

## 2022-01-25 NOTE — Progress Notes (Signed)
ANTICOAGULATION CONSULT NOTE - Follow Up Consult  Pharmacy Consult for Heparin Indication:  History of PE (9/23)  Allergies  Allergen Reactions   Rifapentine Other (See Comments)    Flu-like symptoms   Amoxicillin Rash   Clavulanic Acid Rash    Patient Measurements: Height: '5\' 3"'$  (160 cm) Weight: 77 kg (169 lb 12.1 oz) IBW/kg (Calculated) : 52.4 Heparin Dosing Weight: 68 kg  Vital Signs: Temp: 98 F (36.7 C) (12/10 1521) Temp Source: Oral (12/10 1521) BP: 132/86 (12/10 1100) Pulse Rate: 77 (12/10 1521)  Labs: Recent Labs    01/23/22 0532 01/24/22 0337 01/25/22 0308 01/25/22 0318 01/25/22 0642 01/25/22 1617  HGB 13.9 13.2 13.7  --   --   --   HCT 42.7 39.9 42.5  --   --   --   PLT 268 246 250  --   --   --   APTT 72* 81*  --  107* 113*  --   HEPARINUNFRC 1.08* 0.94*  --  >1.10* >1.10* 0.83*  CREATININE 0.45  --   --   --   --   --     Estimated Creatinine Clearance: 67 mL/min (by C-G formula based on SCr of 0.45 mg/dL).  Assessment: 66 YO female presented with concern for focal seizure and status epilepticus. On Eliquis at home for history of PE in September 2023.  Pharmacy consulted to dose IV heparin while Eliquis is on hold.  Heparin level supratherapeutic at > 1.1 and aPTT also supratherapeutic at 113 seconds. Levels are correlating so likely a true heparin level. Will proceed with further heparin dosing off heparin levels. No issues with heparin infusing or bleeding noted per RN.   Heparin came back a little therapeutic tonight. We will hold heparin for 30 minutes then decrease rate.   Goal of Therapy:  Heparin level 0.3-0.7 units/ml Monitor platelets by anticoagulation protocol: Yes   Plan:  Hold heparin infusion x 30 minutes Reduce heparin drip to 700 units/hr F/u heparin level 6 hours after rate change Monitor daily CBC, heparin levels, and s/sx of bleeding.   Onnie Boer, PharmD, BCIDP, AAHIVP, CPP Infectious Disease Pharmacist 01/25/2022 5:11  PM

## 2022-01-25 NOTE — Progress Notes (Signed)
Inpatient Rehab Admissions Coordinator:  ? ?Per therapy recommendations,  patient was screened for CIR candidacy by Dyke Weible, MS, CCC-SLP. At this time, Pt. Appears to be a a potential candidate for CIR. I will place   order for rehab consult per protocol for full assessment. Please contact me any with questions. ? ?Elzada Pytel, MS, CCC-SLP ?Rehab Admissions Coordinator  ?[phone] (celll) ?[phone] (office) ? ?

## 2022-01-25 NOTE — Progress Notes (Signed)
ANTICOAGULATION CONSULT NOTE - Follow Up Consult  Pharmacy Consult for Heparin Indication:  History of PE (9/23)  Allergies  Allergen Reactions   Rifapentine Other (See Comments)    Flu-like symptoms   Amoxicillin Rash   Clavulanic Acid Rash    Patient Measurements: Height: '5\' 3"'$  (160 cm) Weight: 77 kg (169 lb 12.1 oz) IBW/kg (Calculated) : 52.4 Heparin Dosing Weight: 68 kg  Vital Signs: Temp: 97.8 F (36.6 C) (12/10 0256) Temp Source: Oral (12/10 0256) BP: 133/83 (12/10 0256) Pulse Rate: 76 (12/10 0300)  Labs: Recent Labs    01/23/22 0532 01/24/22 0337 01/25/22 0308 01/25/22 0318  HGB 13.9 13.2 13.7  --   HCT 42.7 39.9 42.5  --   PLT 268 246 250  --   APTT 72* 81*  --  107*  HEPARINUNFRC 1.08* 0.94*  --  >1.10*  CREATININE 0.45  --   --   --     Estimated Creatinine Clearance: 67 mL/min (by C-G formula based on SCr of 0.45 mg/dL).  Assessment: 66 YO female presented with concern for focal seizure and status epilepticus. On Eliquis at home for history of PE in September 2023.  Pharmacy consulted to dose IV heparin while Eliquis is on hold.  Heparin level supratherapeutic at > 1.1 and aPTT also supratherapeutic at 113 seconds. Levels are correlating so likely a true heparin level. Will proceed with further heparin dosing off heparin levels. No issues with heparin infusing or bleeding noted per RN.   Goal of Therapy:  Heparin level 0.3-0.7 units/ml Monitor platelets by anticoagulation protocol: Yes   Plan:  Hold heparin infusion x 1 hour, then Reduce heparin drip to 800 units/hr F/u heparin level 6 hours after rate change Monitor daily CBC, heparin levels, and s/sx of bleeding.   Dimple Nanas, PharmD, BCPS 01/25/2022 7:15 AM

## 2022-01-25 NOTE — Progress Notes (Signed)
[facility]  PROGRESS NOTE  Lindsey Daniels OVZ:[id number] DOB: 06/14/1955 DOA: 01/17/2022 PCP: Harrison Mons, PA   Brief HPI:   66 y.o. female with medical history significant of seasonal allergy, anal fissure, anxiety, depression, cataracts, elevated LFTs, hyperglycemia, hyperlipidemia, hypertension, osteoarthritis pars ephedra lumbar spine, spondylolisthesis of the lumbar spine, partial seizures who presented to the emergency department via EMS due to having a focal seizure an hour before arrival.  Patient was seen in the ER with left-sided facial and LUE twitch.  Her LUE is weak at baseline, but she was able to lift up prior to the seizure.  She was recently admitted for seizure and discharged on 01/14/2022 after increasing Keppra and restarting dexamethasone.  Patient was found to be altered mental status and failed to respond due to increasing doses of antiepileptics.   Neurology was consulted,MRI brain had an acute change of abnormal diffusion in lateral R perirolandic cortex. cEEG with focal epileptogenicity.  She was transferred to ICU and intubated for airway protection. She was weaned off mechanical ventilation, propofol was stopped. TRH resumed care on 01/25/2022  Subjective   Patient seen and examined, denies any complaints.   Assessment/Plan:   Focal status epilepticus -History of seizure/history of pleomorphic xanthoastrocytoma -S/p craniotomy in 2015 2022 with XRT -Continue Keppra, Onfi -LTM discontinued, neurology has signed off -Continue Decadron 4 mg IV every 12 hours  Acute hypoxemic respiratory failure -Required intubation for airway protection -Patient was extubated on 12/8 -Failed swallow evaluation, feeding tube placed  Dysphagia -Patient has Cortrak feeding tube in place -Dysphagia 1 diet ordered per speech therapy -If diet can be advanced, will consider stopping feeding tube  Hyperglycemia -Secondary to Decadron -Hemoglobin A1c is 5.3 -Continue  Lantus, sliding scale insulin with NovoLog -CBG well-controlled  Hypertension -Blood pressure is stable -Home medications on hold  Depression -Will resume sertraline    History of pulmonary embolism -Continue heparin per pharmacy  Disposition -Patient to go to inpatient rehab     Medications     cloBAZam  7.5 mg Per Tube BID   dexamethasone (DECADRON) injection  4 mg Intravenous Q12H   docusate  100 mg Per Tube BID   feeding supplement (PROSource TF20)  60 mL Per Tube Daily   insulin aspart  0-15 Units Subcutaneous TID AC & HS   insulin glargine-yfgn  5 Units Subcutaneous BID   mouth rinse  15 mL Mouth Rinse 4 times per day   pantoprazole (PROTONIX) IV  40 mg Intravenous Q24H   polyethylene glycol  17 g Per Tube Daily     Data Reviewed:   CBG:  Recent Labs  Lab 01/24/22 1910 01/24/22 2248 01/25/22 0410 01/25/22 0803 01/25/22 1207  GLUCAP 141* 89 130* 101* 112*    SpO2: 100 % O2 Flow Rate (L/min): 2 L/min FiO2 (%): 40 %    Vitals:   01/25/22 0256 01/25/22 0300 01/25/22 0756 01/25/22 1100  BP: 133/83  (!) 135/92 132/86  Pulse: 67 76 78 74  Resp: '19 16 18 19  '$ Temp: 97.8 F (36.6 C)  97.9 F (36.6 C) 97.8 F (36.6 C)  TempSrc: Oral  Oral Oral  SpO2: 93% 97% 96% 100%  Weight:      Height:          Data Reviewed:  Basic Metabolic Panel: Recent Labs  Lab 01/19/22 0456 01/20/22 0444 01/21/22 0410 01/22/22 0653 01/23/22 0532  NA 142 139  --   --  138  K 3.5 4.5  --   --  4.3  CL 108 108  --   --  103  CO2 25 21*  --   --  24  GLUCOSE 139* 155*  --   --  179*  BUN 7* 12  --   --  17  CREATININE 0.47 0.45  --   --  0.45  CALCIUM 8.8* 8.2*  --   --  9.0  MG 1.7 2.0 1.9 1.9  --   PHOS 3.4 2.9 3.2 4.0  --     CBC: Recent Labs  Lab 01/21/22 0410 01/22/22 0653 01/23/22 0532 01/24/22 0337 01/25/22 0308  WBC 10.4 9.4 13.3* 9.5 11.5*  HGB 14.3 12.7 13.9 13.2 13.7  HCT 43.3 39.3 42.7 39.9 42.5  MCV 91.5 92.0 91.4 89.7 90.8  PLT 169  203 268 246 250    LFT Recent Labs  Lab 01/20/22 0444  AST 24  ALT 19  ALKPHOS 49  BILITOT 0.6  PROT 5.0*  ALBUMIN 2.8*     Antibiotics: Anti-infectives (From admission, onward)    None        DVT prophylaxis: Continue heparin  Code Status: Partial code  Family Communication: Discussed with patient daughter at bedside   CONSULTS PCCM, neurology   Objective    Physical Examination:   General: Appears in no acute distress Cardiovascular: S1-S2, regular, no murmur auscultated Respiratory: Clear to auscultation bilaterally Abdomen: Soft, nontender, no organomegaly Extremities: No edema in the lower extremities Neurologic: Left upper extremity 4/5 strength at baseline, no other focal deficit noted   Status is: Inpatient:             Oswald Hillock   [facility] If 7PM-7AM, please contact night-coverage at [email or web address], Office  [phone]   01/25/2022, 3:07 PM  LOS: 7 days

## 2022-01-26 ENCOUNTER — Inpatient Hospital Stay (HOSPITAL_COMMUNITY): Payer: BC Managed Care – PPO

## 2022-01-26 ENCOUNTER — Other Ambulatory Visit: Payer: Self-pay

## 2022-01-26 DIAGNOSIS — I159 Secondary hypertension, unspecified: Secondary | ICD-10-CM | POA: Diagnosis not present

## 2022-01-26 DIAGNOSIS — G40901 Epilepsy, unspecified, not intractable, with status epilepticus: Secondary | ICD-10-CM | POA: Diagnosis not present

## 2022-01-26 DIAGNOSIS — R569 Unspecified convulsions: Secondary | ICD-10-CM | POA: Diagnosis not present

## 2022-01-26 LAB — BASIC METABOLIC PANEL
Anion gap: 15 (ref 5–15)
BUN: 24 mg/dL — ABNORMAL HIGH (ref 8–23)
CO2: 20 mmol/L — ABNORMAL LOW (ref 22–32)
Calcium: 9.1 mg/dL (ref 8.9–10.3)
Chloride: 99 mmol/L (ref 98–111)
Creatinine, Ser: 0.51 mg/dL (ref 0.44–1.00)
GFR, Estimated: >60 mL/min (ref >60)
Glucose, Bld: 114 mg/dL — ABNORMAL HIGH (ref 70–99)
Potassium: 4.2 mmol/L (ref 3.5–5.1)
Sodium: 134 mmol/L — ABNORMAL LOW (ref 135–145)

## 2022-01-26 LAB — HEPARIN LEVEL (UNFRACTIONATED)
Heparin Unfractionated: 0.55 IU/mL (ref 0.30–0.70)
Heparin Unfractionated: 0.75 IU/mL — ABNORMAL HIGH (ref 0.30–0.70)
Heparin Unfractionated: 0.43 IU/mL (ref 0.30–0.70)

## 2022-01-26 LAB — GLUCOSE, CAPILLARY
Glucose-Capillary: 119 mg/dL — ABNORMAL HIGH (ref 70–99)
Glucose-Capillary: 97 mg/dL (ref 70–99)
Glucose-Capillary: 135 mg/dL — ABNORMAL HIGH (ref 70–99)
Glucose-Capillary: 118 mg/dL — ABNORMAL HIGH (ref 70–99)
Glucose-Capillary: 145 mg/dL — ABNORMAL HIGH (ref 70–99)

## 2022-01-26 LAB — CBC
HCT: 40.6 % (ref 36.0–46.0)
Hemoglobin: 13.5 g/dL (ref 12.0–15.0)
MCH: 29.9 pg (ref 26.0–34.0)
MCHC: 33.3 g/dL (ref 30.0–36.0)
MCV: 90 fL (ref 80.0–100.0)
Platelets: 287 10*3/uL (ref 150–400)
RBC: 4.51 MIL/uL (ref 3.87–5.11)
RDW: 13.9 % (ref 11.5–15.5)
WBC: 15.5 10*3/uL — ABNORMAL HIGH (ref 4.0–10.5)
nRBC: 0 % (ref 0.0–0.2)

## 2022-01-26 LAB — MAGNESIUM: Magnesium: 2 mg/dL (ref 1.7–2.4)

## 2022-01-26 MED ORDER — SODIUM CHLORIDE 0.9 % IV SOLN
12.5000 mg | Freq: Four times a day (QID) | INTRAVENOUS | Status: DC | PRN
  Administered 2022-01-27: 12.5 mg via INTRAVENOUS
  Filled 2022-01-26: qty 0.5

## 2022-01-26 MED ORDER — CLOBAZAM 2.5 MG/ML PO SUSP
7.5000 mg | Freq: Two times a day (BID) | ORAL | Status: DC
  Administered 2022-01-26 – 2022-01-29 (×6): 7.5 mg via ORAL
  Filled 2022-01-26 (×6): qty 4

## 2022-01-26 MED ORDER — LORAZEPAM 2 MG/ML IJ SOLN
1.0000 mg | Freq: Once | INTRAMUSCULAR | Status: AC
  Administered 2022-01-26: 1 mg via INTRAVENOUS
  Filled 2022-01-26: qty 1

## 2022-01-26 MED ORDER — INSULIN ASPART 100 UNIT/ML IJ SOLN
0.0000 [IU] | INTRAMUSCULAR | Status: DC
  Administered 2022-01-26: 2 [IU] via SUBCUTANEOUS
  Administered 2022-01-27: 3 [IU] via SUBCUTANEOUS
  Administered 2022-01-27: 2 [IU] via SUBCUTANEOUS
  Administered 2022-01-27 – 2022-01-28 (×3): 3 [IU] via SUBCUTANEOUS
  Administered 2022-01-28 – 2022-01-29 (×3): 2 [IU] via SUBCUTANEOUS

## 2022-01-26 MED ORDER — OSMOLITE 1.2 CAL PO LIQD
960.0000 mL | ORAL | Status: DC
  Administered 2022-01-26 – 2022-01-27 (×2): 960 mL
  Filled 2022-01-26 (×5): qty 1000

## 2022-01-26 MED ORDER — POLYETHYLENE GLYCOL 3350 17 G PO PACK: 17.0000 g | PACK | Freq: Every day | ORAL | Status: DC | PRN

## 2022-01-26 MED ORDER — DOCUSATE SODIUM 100 MG PO CAPS
100.0000 mg | ORAL_CAPSULE | Freq: Two times a day (BID) | ORAL | Status: DC | PRN
  Administered 2022-01-29: 100 mg via ORAL
  Filled 2022-01-26: qty 1

## 2022-01-26 MED ORDER — LOSARTAN POTASSIUM 50 MG PO TABS
50.0000 mg | ORAL_TABLET | Freq: Every day | ORAL | Status: DC
  Administered 2022-01-26 – 2022-01-29 (×4): 50 mg via ORAL
  Filled 2022-01-26 (×4): qty 1

## 2022-01-26 MED ORDER — ENSURE ENLIVE PO LIQD
237.0000 mL | Freq: Two times a day (BID) | ORAL | Status: DC
  Administered 2022-01-27 – 2022-01-28 (×2): 237 mL via ORAL

## 2022-01-26 MED ORDER — AMLODIPINE BESYLATE 10 MG PO TABS
10.0000 mg | ORAL_TABLET | Freq: Every day | ORAL | Status: DC
  Administered 2022-01-26 – 2022-01-29 (×4): 10 mg via ORAL
  Filled 2022-01-26 (×4): qty 1

## 2022-01-26 MED ORDER — PANTOPRAZOLE SODIUM 40 MG PO TBEC
40.0000 mg | DELAYED_RELEASE_TABLET | Freq: Every day | ORAL | Status: DC
  Administered 2022-01-26 – 2022-01-27 (×2): 40 mg via ORAL
  Filled 2022-01-26 (×3): qty 1

## 2022-01-26 MED ORDER — LABETALOL HCL 5 MG/ML IV SOLN: 10.0000 mg | INTRAVENOUS | Status: DC | PRN

## 2022-01-26 NOTE — Progress Notes (Addendum)
       Overnight   (Remote coverage - notification)  NAME: Lindsey Daniels MRN: [id number] DOB : 10-24-1955    Date of Service   01/26/2022   HPI/Events of Note   Notified by RN for unwitnessed (possible) fall.  66 year old female with medical history of seasonal allergy, anal fissure, anxiety, depression, cataracts, elevated LFT, hyperglycemia, hyperlipidemia, hypertension, osteoarthritis pars ephedra lumbar spine, spondylolisthesis of the lumbar spine, partial seizures originally presenting to the ER by EMS due to left focal seizure and hour before arrival.  Notified by RN after patient was found on the floor. RN conveyed no found/obvious injury on assessment, and patient was helped back to the bed.  Reported as finding no deformity, contusion, abrasion, punctures, tears, lacerations, or swelling.  Patient is currently on heparin drip for pulmonary embolism.  Head CT is ordered- pending - due to possible fall-(unwitnessed)-anticoagulant use.    Interventions/ Plan   Head CT - results, in part, below  Nurse rounding increases due to (possible) unwitnessed fall        ====================================================== Imaging : 03010 Hrs  ""FINDINGS: Brain: Examination mildly degraded by motion artifact.   Parenchymal changes including vasogenic edema related to patient's known right temporal lobe mass again seen. Hypodensity/edema extends cephalad to involve the right perirolandic cortices. Overall, appearance is relatively stable as compared to most recent CT and MRI from 01/17/2022.   No acute intracranial hemorrhage. No other new acute cortically based infarct. No other mass lesion or mass effect. No hydrocephalus or midline shift. Basilar cisterns remain patent. No extra-axial fluid collection.   Vascular: No abnormal hyperdense vessel.   Skull: Scalp soft tissues demonstrate no acute finding. Sequelae of prior right temporal craniotomy noted. Calvarium  otherwise intact without fracture.   Sinuses/Orbits: Globes and orbital soft tissues demonstrate no acute finding. Nasogastric tube in place. Paranasal sinuses and mastoid air cells are clear.   Other: None.   IMPRESSION: 1. No acute traumatic injury or other abnormality status post trauma/fall. 2. Similar appearance of known right temporal lobe mass with associated regional edema. No new or progressive finding. 3. Sequelae of prior right temporal craniotomy.     Electronically Signed   By: Jeannine Boga M.D.   On: 01/26/2022 03:07   ""  =============================================================    Gershon Cull BSN MSNA MSN ACNPC-AG Acute Care Nurse Practitioner [place]

## 2022-01-26 NOTE — Progress Notes (Signed)
   01/26/22 0100  What Happened  Was fall witnessed? No  Was patient injured? No  Patient found on floor  Found by Staff-comment (primary RN)  Provider Notification  Provider Name/Title Gershon Cull NP  Date Provider Notified 01/26/22  Time Provider Notified 0121  Method of Notification Page (secure chat)  Notification Reason Fall  Adult Fall Risk Assessment  Risk Factor Category (scoring not indicated) Not Applicable  Age 66  Fall History: Fall within 6 months prior to admission 0  Elimination; Bowel and/or Urine Incontinence 2  Elimination; Bowel and/or Urine Urgency/Frequency 2  Medications: includes PCA/Opiates, Anti-convulsants, Anti-hypertensives, Diuretics, Hypnotics, Laxatives, Sedatives, and Psychotropics 5  Patient Care Equipment 3  Mobility-Assistance 2  Mobility-Gait 2  Mobility-Sensory Deficit 0  Altered awareness of immediate physical environment 1  Impulsiveness 2  Lack of understanding of one's physical/cognitive limitations 4  Total Score 24  Patient Fall Risk Level High fall risk  Adult Fall Risk Interventions  Required Bundle Interventions *See Row Information* High fall risk - low, moderate, and high requirements implemented  Additional Interventions Reorient/diversional activities with confused patients  Screening for Fall Injury Risk (To be completed on HIGH fall risk patients) - Assessing Need for Floor Mats  Risk For Fall Injury- Criteria for Floor Mats Previous fall this admission  Will Implement Floor Mats Yes  Vitals  ECG Heart Rate 79  Resp (!) 21  Pain Assessment  Pain Scale 0-10  Pain Score 0

## 2022-01-26 NOTE — Progress Notes (Signed)
  Inpatient Rehabilitation Admissions Coordinator   Met with patient and brother, Gwynneth Macleod, at bedside for rehab assessment. We discussed goals and expectations of a possible CIR admit. Wes can not provide expected caregiver support that is recommended after a CIR admit. She was recently at [facility] 8/23 and that is their preferred SNF. I will alert acute team and TOC. We will sign off.Please call me with any questions.   Danne Baxter, RN, MSN Rehab Admissions Coordinator [phone]

## 2022-01-26 NOTE — NC FL2 (Signed)
[place] LEVEL OF CARE FORM     IDENTIFICATION  Patient Name: Lindsey Daniels Birthdate: November 18, 1955 Sex: female Admission Date (Current Location): 01/17/2022  [facility] and [place] Number:  Herbalist and Address:  The . [facility], [place] [address], [place], [place] [address]      Provider Number: [id number]  Attending Physician Name and Address:  Oswald Hillock, MD  Relative Name and Phone Number:       Current Level of Care: Hospital Recommended Level of Care: [place] Prior Approval Number:    Date Approved/Denied:   PASRR Number: [id number]  Discharge Plan: SNF    Current Diagnoses: Patient Active Problem List   Diagnosis Date Noted   Oliguria 01/19/2022   Status epilepticus ([place]) 01/18/2022   Acute respiratory failure ([place]) 01/18/2022   History of pulmonary embolism 01/18/2022   Hypokalemia 01/17/2022   Hypomagnesemia 01/17/2022   Hypoglycemia 01/17/2022   Pulmonary embolism ([place]) 11/18/2021   Obesity (BMI 30-39.9) 09/27/2021   Headache 78/24/2353   Acute metabolic encephalopathy 61/44/3154   Weakness of both lower extremities 09/22/2021   HTN (hypertension) 09/22/2021   Focal seizure ([place]) 07/15/2021   Depression 07/15/2021   History of colon polyps 12/27/2020   Protein-calorie malnutrition, severe 11/28/2020   Cerebral edema ([place]) 11/25/2020   Osteopenia of right hip 06/13/2020   History of melanoma excision 02/01/2020   Gastroesophageal reflux disease without esophagitis 02/01/2020   Increased body mass index 05/09/2019   Seasonal allergies 11/21/2016   Loss of height 11/21/2016   Elevated BP without diagnosis of hypertension 11/21/2016   Encounter for monitoring anticonvulsant therapy 09/01/2016   Exposure to TB 06/07/2015   HSV infection 06/07/2015   Depressed mood 06/07/2015   Carpal tunnel syndrome 03/06/2014   Spondylolysis 01/19/2014   Congenital spondylolisthesis of lumbosacral  region 11/24/2013   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[place]) 10/17/2013   Low back pain 10/10/2013   Pleomorphic xanthoastrocytoma ([place]) 10/02/2013   Intracranial tumor ([place]) 08/28/2013   Compression fracture of lumbar spine, non-traumatic ([place]) 08/28/2013   Syncope 08/18/2013   Seizure disorder ([place]) 08/18/2013   History of benign neoplasm of brain 02/16/2013   Bilateral leg pain 09/07/2012   Foot pain, bilateral 09/07/2012   Plantar fasciitis BILATERAL 08/25/2012   Hyperlipidemia 12/24/2011    Orientation RESPIRATION BLADDER Height & Weight     Self, Time, Situation, Place  Normal Continent, External catheter Weight: 169 lb 12.1 oz (77 kg) Height:  '5\' 3"'$  (160 cm)  BEHAVIORAL SYMPTOMS/MOOD NEUROLOGICAL BOWEL NUTRITION STATUS    Convulsions/Seizures Continent Diet (See discharge summary)  AMBULATORY STATUS COMMUNICATION OF NEEDS Skin   Extensive Assist   Normal                       Personal Care Assistance Level of Assistance  Bathing, Feeding, Dressing Bathing Assistance: Limited assistance Feeding assistance: Limited assistance Dressing Assistance: Limited assistance     Functional Limitations Info  Sight, Hearing, Speech Sight Info: Adequate Hearing Info: Adequate Speech Info: Adequate    SPECIAL CARE FACTORS FREQUENCY  PT (By licensed PT), OT (By licensed OT)     PT Frequency: 5x weekly OT Frequency: 5x weekly            Contractures Contractures Info: Not present    Additional Factors Info  Allergies, Code Status, Insulin Sliding Scale Code Status Info: Partial Allergies Info: Rifapentine, Amoxicillin, Clavulanic Acid   Insulin Sliding Scale Info:  See discharge summary       Current Medications (01/26/2022):  This is the current hospital active medication list Current Facility-Administered Medications  Medication Dose Route Frequency Provider Last Rate Last Admin   acetaminophen (TYLENOL) tablet 650 mg  650 mg Oral Q6H PRN Reubin Milan, MD   650 mg at 01/24/22 1052   Or   acetaminophen (TYLENOL) suppository 650 mg  650 mg Rectal Q6H PRN Reubin Milan, MD       cloBAZam (ONFI) 2.5 MG/ML oral suspension 7.5 mg  7.5 mg Per Tube BID Margaretha Seeds, MD   7.5 mg at 01/26/22 1056   dexamethasone (DECADRON) injection 4 mg  4 mg Intravenous Q12H Reubin Milan, MD   4 mg at 01/26/22 1005   docusate (COLACE) 50 MG/5ML liquid 100 mg  100 mg Per Tube BID Cristal Generous, NP   100 mg at 01/23/22 2137   feeding supplement (PROSource TF20) liquid 60 mL  60 mL Per Tube Daily Margaretha Seeds, MD   60 mL at 01/26/22 1056   heparin ADULT infusion 100 units/mL (25000 units/25m)  700 Units/hr Intravenous Continuous Pham, Minh Q, RPH-CPP 7 mL/hr at 01/26/22 1102 700 Units/hr at 01/26/22 1102   insulin aspart (novoLOG) injection 0-15 Units  0-15 Units Subcutaneous TID AC & HS LOswald Hillock MD   2 Units at 01/25/22 2148   insulin glargine-yfgn (SEMGLEE) injection 5 Units  5 Units Subcutaneous BID BErick Colace NP   5 Units at 01/26/22 1056   levETIRAcetam (KEPPRA) IVPB 1000 mg/100 mL premix  1,000 mg Intravenous BID LKerney Elbe MD 400 mL/hr at 01/26/22 1006 1,000 mg at 01/26/22 1006   LORazepam (ATIVAN) injection 2 mg  2 mg Intravenous Q4H PRN OReubin Milan MD       ondansetron ([facility] tablet 4 mg  4 mg Oral Q6H PRN OReubin Milan MD       Or   ondansetron ([facility] injection 4 mg  4 mg Intravenous Q6H PRN OReubin Milan MD       Oral care mouth rinse  15 mL Mouth Rinse 4 times per day EMargaretha Seeds MD   15 mL at 01/26/22 1103   pantoprazole (PROTONIX) injection 40 mg  40 mg Intravenous Q24H EMargaretha Seeds MD   40 mg at 01/25/22 2144   polyethylene glycol (MIRALAX / GLYCOLAX) packet 17 g  17 g Per Tube Daily Bowser, GLaurel Dimmer NP   17 g at 01/22/22 0924   saline (AYR) nasal gel with aloe 1 Application  1 Application Each Nare [id number] BErick Colace NP       sertraline (ZOLOFT) tablet 100 mg   100 mg Oral Daily LOswald Hillock MD   100 mg at 01/26/22 1056     Discharge Medications: Please see discharge summary for a list of discharge medications.  Relevant Imaging Results:  Relevant Lab Results:   Additional Information SSN: [id number] CArchie Endo LCSW

## 2022-01-26 NOTE — Progress Notes (Signed)
Called by RN, that patient is agitated. Came to see patient, she is restless, trying to climb out of bed.   Delirium  Cannot give haldol as she had elevated Qtc > 500on EKG on 12/2, and had 7 beat NSVT today. Magnesium 2.0  Will get EKG  1:1 sitter for safety

## 2022-01-26 NOTE — Progress Notes (Signed)
Patient returned safely from [place].

## 2022-01-26 NOTE — Progress Notes (Signed)
ANTICOAGULATION CONSULT NOTE - Follow Up Consult  Pharmacy Consult for Heparin Indication:  History of PE (9/23)  Allergies  Allergen Reactions   Rifapentine Other (See Comments)    Flu-like symptoms   Amoxicillin Rash   Clavulanic Acid Rash    Patient Measurements: Height: '5\' 3"'$  (160 cm) Weight: 77 kg (169 lb 12.1 oz) IBW/kg (Calculated) : 52.4 Heparin Dosing Weight: 68 kg  Vital Signs: Temp: 98 F (36.7 C) (12/11 1920) Temp Source: Oral (12/11 1920) BP: 104/61 (12/11 1920) Pulse Rate: 64 (12/11 1920)  Labs: Recent Labs    01/24/22 0337 01/25/22 0308 01/25/22 0318 01/25/22 8891 01/25/22 1617 01/26/22 0034 01/26/22 0843 01/26/22 1852  HGB 13.2 13.7  --   --   --  13.5  --   --   HCT 39.9 42.5  --   --   --  40.6  --   --   PLT 246 250  --   --   --  287  --   --   APTT 81*  --  107* 113*  --   --   --   --   HEPARINUNFRC 0.94*  --  >1.10* >1.10*   < > 0.55 0.75* 0.43  CREATININE  --   --   --   --   --  0.51  --   --    < > = values in this interval not displayed.    Estimated Creatinine Clearance: 67 mL/min (by C-G formula based on SCr of 0.51 mg/dL).  Assessment: 66 YO female presented with concern for focal seizure and status epilepticus. On Eliquis at home for history of PE in September 2023. Pharmacy consulted to dose IV heparin while Eliquis is on hold (last dose reportedly 11/30 PTA).  Heparin level back up to slightly supratherapeutic at 0.75 on heparin at 700 units/hr. No issus with infusion or overt s/sx of bleeding reported. CBC remains stable.  Heparin level came back therapeutic tonight. Cont at current rate and check in AM. Goal of Therapy:  Heparin level 0.3-0.7 units/ml Monitor platelets by anticoagulation protocol: Yes   Plan:  Cont heparin drip at 600 units/hr Monitor daily CBC, heparin levels, and s/sx of bleeding.    Onnie Boer, PharmD, BCIDP, AAHIVP, CPP Infectious Disease Pharmacist 01/26/2022 7:39 PM

## 2022-01-26 NOTE — Progress Notes (Addendum)
CSW was notified by Pamala Hurry of CIR that patient is requesting SNF placement at [facility].  CSW completed FL2 and sent patient's clinicals to [facility] for review.  CSW spoke with Kia at [facility] who states she will initiate insurance authorization.  Madilyn Fireman, MSW, LCSW Transitions of Care  Clinical Social Worker II [phone]

## 2022-01-26 NOTE — Progress Notes (Signed)
ANTICOAGULATION CONSULT NOTE - Follow Up Consult  Pharmacy Consult for Heparin Indication:  History of PE (9/23)  Allergies  Allergen Reactions   Rifapentine Other (See Comments)    Flu-like symptoms   Amoxicillin Rash   Clavulanic Acid Rash    Patient Measurements: Height: '5\' 3"'$  (160 cm) Weight: 77 kg (169 lb 12.1 oz) IBW/kg (Calculated) : 52.4 Heparin Dosing Weight: 68 kg  Vital Signs: Temp: 98 F (36.7 C) (12/10 2311) Temp Source: Oral (12/10 2311) BP: 120/78 (12/10 2311) Pulse Rate: 73 (12/10 2311)  Labs: Recent Labs    01/23/22 0532 01/24/22 0337 01/25/22 0308 01/25/22 0318 01/25/22 0642 01/25/22 1617 01/26/22 0034  HGB 13.9 13.2 13.7  --   --   --  13.5  HCT 42.7 39.9 42.5  --   --   --  40.6  PLT 268 246 250  --   --   --  287  APTT 72* 81*  --  107* 113*  --   --   HEPARINUNFRC 1.08* 0.94*  --  >1.10* >1.10* 0.83* 0.55  CREATININE 0.45  --   --   --   --   --  0.51    Estimated Creatinine Clearance: 67 mL/min (by C-G formula based on SCr of 0.51 mg/dL).  Assessment: 66 YO female presented with concern for focal seizure and status epilepticus. On Eliquis at home for history of PE in September 2023.  Pharmacy consulted to dose IV heparin while Eliquis is on hold.  Heparin level therapeutic: 0.55 on 700 units/hr; no issus with infusion or overt s/sx of bleeding reported; CBC remains stable  Goal of Therapy:  Heparin level 0.3-0.7 units/ml Monitor platelets by anticoagulation protocol: Yes   Plan:  Continue heparin drip at 700 units/hr Monitor daily CBC, heparin levels, and s/sx of bleeding.   Georga Bora, PharmD Clinical Pharmacist 01/26/2022 1:33 AM Please check AMION for all [place] numbers

## 2022-01-26 NOTE — Progress Notes (Signed)
Pt oriented to person and year - disoriented to place and situation.  Pt continuing to attempt to climb out of the bed.  Unable to console or distract patient.  Pt unable to follow safety measures.  MD notified.  Safety sitter ordered and at bedside.

## 2022-01-26 NOTE — Progress Notes (Signed)
Patient found sitting on the floor, PIV out blood on the floor.    Patient states she was trying to get to the front door. She is alert and confused. Denies any pain.   [facility] informed.

## 2022-01-26 NOTE — Progress Notes (Signed)
Pt had 7 beat run of v-tach at ~10am.  Pt asymptomatic and vitals WDL.  MD notified.

## 2022-01-26 NOTE — Progress Notes (Signed)
[facility]  PROGRESS NOTE  Lindsey Daniels QQV:[id number] DOB: 1955/04/22 DOA: 01/17/2022 PCP: Harrison Mons, PA   Brief HPI:   66 y.o. female with medical history significant of seasonal allergy, anal fissure, anxiety, depression, cataracts, elevated LFTs, hyperglycemia, hyperlipidemia, hypertension, osteoarthritis pars ephedra lumbar spine, spondylolisthesis of the lumbar spine, partial seizures who presented to the emergency department via EMS due to having a focal seizure an hour before arrival.  Patient was seen in the ER with left-sided facial and LUE twitch.  Her LUE is weak at baseline, but she was able to lift up prior to the seizure.  She was recently admitted for seizure and discharged on 01/14/2022 after increasing Keppra and restarting dexamethasone.  Patient was found to be altered mental status and failed to respond due to increasing doses of antiepileptics.   Neurology was consulted,MRI brain had an acute change of abnormal diffusion in lateral R perirolandic cortex. cEEG with focal epileptogenicity.  She was transferred to ICU and intubated for airway protection. She was weaned off mechanical ventilation, propofol was stopped. TRH resumed care on 01/25/2022  Subjective   Patient was found on floor last night.  Denies falling.  Patient says that she slid onto the floor.  CT head was obtained which did not show acute changes.  Also has been vomiting.   Assessment/Plan:   Focal status epilepticus -History of seizure/history of pleomorphic xanthoastrocytoma -S/p craniotomy in 2015 2022 with XRT -Continue Keppra, Onfi -LTM discontinued, neurology has signed off -Continue Decadron 4 mg IV every 12 hours  S/p Fall overnight -CT head obtained, showed no new changes  Vomiting -Unclear etiology, likely hypertension -Patient antihypertensive medications were not resumed pharmacy -Zofran not helping, will discontinue Zofran -Start IV Phenergan   Hypertension -Blood  pressure has been elevated -Resume home medications for hypertension including amlodipine and Cozaar  Acute hypoxemic respiratory failure -Required intubation for airway protection -Patient was extubated on 12/8 -Failed swallow evaluation, feeding tube placed  Leukocytosis -wbc is up to 15000 -likely from steroids  Dysphagia -Patient has Cortrak feeding tube in place -Dysphagia 1 diet ordered per speech therapy -If diet can be advanced, will consider stopping feeding tube  Hyperglycemia -Secondary to Decadron -Hemoglobin A1c is 5.3 -Continue Lantus, sliding scale insulin with NovoLog -CBG well-controlled  Hypertension -Blood pressure is stable -Home medications on hold  Depression -Will resume sertraline    History of pulmonary embolism -Continue heparin per pharmacy  Disposition -Patient to go to inpatient rehab     Medications     cloBAZam  7.5 mg Per Tube BID   dexamethasone (DECADRON) injection  4 mg Intravenous Q12H   docusate  100 mg Per Tube BID   feeding supplement (PROSource TF20)  60 mL Per Tube Daily   insulin aspart  0-15 Units Subcutaneous TID AC & HS   insulin glargine-yfgn  5 Units Subcutaneous BID   mouth rinse  15 mL Mouth Rinse 4 times per day   pantoprazole (PROTONIX) IV  40 mg Intravenous Q24H   polyethylene glycol  17 g Per Tube Daily   sertraline  100 mg Oral Daily     Data Reviewed:   CBG:  Recent Labs  Lab 01/25/22 0803 01/25/22 1207 01/25/22 1555 01/25/22 2128 01/26/22 0741  GLUCAP 101* 112* 172* 123* 119*    SpO2: 97 % O2 Flow Rate (L/min): 2 L/min FiO2 (%): 40 %    Vitals:   01/25/22 2311 01/26/22 0100 01/26/22 0305 01/26/22 0812  BP: 120/78  139/88  138/81  Pulse: 73  67 72  Resp: 20 (!) '21 13 18  '$ Temp: 98 F (36.7 C)  97.6 F (36.4 C) 97.8 F (36.6 C)  TempSrc: Oral  Oral   SpO2: 95%  94% 97%  Weight:      Height:          Data Reviewed:  Basic Metabolic Panel: Recent Labs  Lab 01/20/22 0444  01/21/22 0410 01/22/22 0653 01/23/22 0532 01/26/22 0034  NA 139  --   --  138 134*  K 4.5  --   --  4.3 4.2  CL 108  --   --  103 99  CO2 21*  --   --  24 20*  GLUCOSE 155*  --   --  179* 114*  BUN 12  --   --  17 24*  CREATININE 0.45  --   --  0.45 0.51  CALCIUM 8.2*  --   --  9.0 9.1  MG 2.0 1.9 1.9  --   --   PHOS 2.9 3.2 4.0  --   --     CBC: Recent Labs  Lab 01/22/22 0653 01/23/22 0532 01/24/22 0337 01/25/22 0308 01/26/22 0034  WBC 9.4 13.3* 9.5 11.5* 15.5*  HGB 12.7 13.9 13.2 13.7 13.5  HCT 39.3 42.7 39.9 42.5 40.6  MCV 92.0 91.4 89.7 90.8 90.0  PLT 203 268 246 250 287    LFT Recent Labs  Lab 01/20/22 0444  AST 24  ALT 19  ALKPHOS 49  BILITOT 0.6  PROT 5.0*  ALBUMIN 2.8*     Antibiotics: Anti-infectives (From admission, onward)    None        DVT prophylaxis: Continue heparin  Code Status: Partial code  Family Communication: Discussed with patient daughter at bedside   CONSULTS PCCM, neurology   Objective    Physical Examination:  Appears in no acute distress S1-S2, regular Clear to auscultation bilaterally Abdomen is soft, nontender, no organomegaly Neuro-motor strength 4/5 in left upper and lower extremity, no other focal deficit noted   Status is: Inpatient:             Oswald Hillock   [facility] If 7PM-7AM, please contact night-coverage at [email or web address], Office  [phone]   01/26/2022, 9:02 AM  LOS: 8 days

## 2022-01-26 NOTE — Progress Notes (Signed)
Nutrition Follow-up  DOCUMENTATION CODES:   Not applicable  INTERVENTION:   Ensure Enlive po BID, each supplement provides 350 kcal and 20 grams of protein.   Encourage PO intake   Initiate nocturnal tube feeding via cortrak tube: Osmolite 1.2 at 80 ml/h x 12 hours to run from 1800 - 0600 (960 ml per day)  Provides 1152 kcal (61% of needs), 53 gm protein (58% of needs), 778 ml free water daily   NUTRITION DIAGNOSIS:   Inadequate oral intake related to dysphagia as evidenced by meal completion < 50%. Ongoing.   GOAL:   Patient will meet greater than or equal to 90% of their needs Met with TF at goal.   MONITOR:   TF tolerance, PO intake, Diet advancement  REASON FOR ASSESSMENT:   Consult Enteral/tube feeding initiation and management  ASSESSMENT:   Pt is a 66yo F with PMH of seasonal allergy, anal fissure, anxiety, depression, cataracts, elevated LFTs, hyperglycemia,HLD, HTN, osteoarthritis pars ephedra lumbar spine, spondylolisthesis of the lumbar spine, and partial seizures who presents with focal seizure an hour before arrival.  Spoke with pt and nurse tech who was at bedside.  Pt ate most of her breakfast but no lunch today due to vomiting and episode of high BP.  Cortrak tube still in place but no TF infusing.  Spoke with MD will start nocturnal TF.  Noted plans to d/c to SNF at time of discharge.   12/8 - s/p cortrak placement; tip gastric  Medications reviewed and include: decadron, SSI, 5 units semglee BID, protonix  Labs reviewed: Na 134 CBG's: 97-172    Diet Order:   Diet Order             DIET - DYS 1 Room service appropriate? Yes with Assist; Fluid consistency: Thin  Diet effective now                   EDUCATION NEEDS:   Not appropriate for education at this time  Skin:  Skin Assessment: Reviewed RN Assessment  Last BM:  12/6  Height:   Ht Readings from Last 1 Encounters:  01/18/22 _0  (1.6 m)    Weight:   Wt Readings  from Last 1 Encounters:  01/20/22 77 kg    BMI:  Body mass index is 30.07 kg/m.  Estimated Nutritional Needs:   Kcal:  1875-2250kcal  Protein:  90-115g  Fluid:  1875-2271m  Robie Mcniel P., RD, LDN, CNSC See AMiON for contact information

## 2022-01-26 NOTE — Progress Notes (Signed)
ANTICOAGULATION CONSULT NOTE - Follow Up Consult  Pharmacy Consult for Heparin Indication:  History of PE (9/23)  Allergies  Allergen Reactions   Rifapentine Other (See Comments)    Flu-like symptoms   Amoxicillin Rash   Clavulanic Acid Rash    Patient Measurements: Height: '5\' 3"'$  (160 cm) Weight: 77 kg (169 lb 12.1 oz) IBW/kg (Calculated) : 52.4 Heparin Dosing Weight: 68 kg  Vital Signs: Temp: 97.8 F (36.6 C) (12/11 0812) Temp Source: Oral (12/11 0305) BP: 123/78 (12/11 1006) Pulse Rate: 72 (12/11 0812)  Labs: Recent Labs    01/24/22 0337 01/25/22 0308 01/25/22 0318 01/25/22 0642 01/25/22 1617 01/26/22 0034 01/26/22 0843  HGB 13.2 13.7  --   --   --  13.5  --   HCT 39.9 42.5  --   --   --  40.6  --   PLT 246 250  --   --   --  287  --   APTT 81*  --  107* 113*  --   --   --   HEPARINUNFRC 0.94*  --  >1.10* >1.10* 0.83* 0.55 0.75*  CREATININE  --   --   --   --   --  0.51  --     Estimated Creatinine Clearance: 67 mL/min (by C-G formula based on SCr of 0.51 mg/dL).  Assessment: 66 YO female presented with concern for focal seizure and status epilepticus. On Eliquis at home for history of PE in September 2023. Pharmacy consulted to dose IV heparin while Eliquis is on hold (last dose reportedly 11/30 PTA).  Heparin level back up to slightly supratherapeutic at 0.75 on heparin at 700 units/hr. No issus with infusion or overt s/sx of bleeding reported. CBC remains stable.  Goal of Therapy:  Heparin level 0.3-0.7 units/ml Monitor platelets by anticoagulation protocol: Yes   Plan:  Reduce heparin drip at 600 units/hr Check 6hr heparin level Monitor daily CBC, heparin levels, and s/sx of bleeding.    Arturo Morton, PharmD, BCPS Please check AMION for all [place] contact numbers Clinical Pharmacist 01/26/2022 12:20 PM

## 2022-01-26 NOTE — Progress Notes (Signed)
Physical Therapy Treatment Patient Details Name: Lindsey Daniels MRN: [id number] DOB: 08-12-1955 Today's Date: 01/26/2022   History of Present Illness Pt is a 66 y/o female admitted 12/2 to ED via EMS due to having a focal seizure and hour before arrival.  In ED pt showed left sided facial and LUE twitch.  Pt is weak in the L UE at baseline.  PMHx:  crani for tumor x2, HTN, OA, seizures, spndylolisthesis    PT Comments    Pt progressing slowly toward goals, slowly becoming more alert and participative.  Emphasis on transition to EOB, scooting, sit to stand safety and progression of gait stability in the RW.    Recommendations for follow up therapy are one component of a multi-disciplinary discharge planning process, led by the attending physician.  Recommendations may be updated based on patient status, additional functional criteria and insurance authorization.  Follow Up Recommendations  Acute inpatient rehab (3hours/day)     Assistance Recommended at Discharge Frequent or constant Supervision/Assistance  Patient can return home with the following A little help with walking and/or transfers;A little help with bathing/dressing/bathroom;Assistance with cooking/housework;Assist for transportation;Help with stairs or ramp for entrance   Equipment Recommendations  Other (comment) (TBD)    Recommendations for Other Services       Precautions / Restrictions Precautions Precautions: Fall     Mobility  Bed Mobility Overal bed mobility: Needs Assistance Bed Mobility: Supine to Sit     Supine to sit: Mod assist     General bed mobility comments: cues and LE assist for pt initiation help.  truncal assist up via R elbow.  No assist noted from pt's L UE.    Transfers Overall transfer level: Needs assistance   Transfers: Sit to/from Stand Sit to Stand: Mod assist           General transfer comment: pt needed cues for hand placement/direction, assist forward and with boost.   Stability in standing.    Ambulation/Gait Ambulation/Gait assistance: Mod assist Gait Distance (Feet): 24 Feet Assistive device: Rolling walker (2 wheels) Gait Pattern/deviations: Step-through pattern, Decreased step length - right, Decreased step length - left, Decreased stance time - left, Decreased stride length, Narrow base of support   Gait velocity interpretation: <1.31 ft/sec, indicative of household ambulator   General Gait Details: needed consistent cuing for sequencing, stability assist, maneuvering of the RW, postural assist.   Stairs             Wheelchair Mobility    Modified Rankin (Stroke Patients Only)       Balance Overall balance assessment: Needs assistance Sitting-balance support: Single extremity supported, No upper extremity supported, Feet supported Sitting balance-Leahy Scale: Fair     Standing balance support: Single extremity supported, Bilateral upper extremity supported Standing balance-Leahy Scale: Poor Standing balance comment: pt reliant on AD and external support.                            Cognition Arousal/Alertness: Awake/alert Behavior During Therapy: Impulsive, Flat affect, WFL for tasks assessed/performed Overall Cognitive Status:  (NT formally, but not fully safety aware.)                                          Exercises      General Comments General comments (skin integrity, edema, etc.): vss  Pertinent Vitals/Pain Pain Assessment Pain Assessment: Faces Faces Pain Scale: No hurt Pain Intervention(s): Monitored during session    Home Living                          Prior Function            PT Goals (current goals can now be found in the care plan section) Acute Rehab PT Goals PT Goal Formulation: With patient Time For Goal Achievement: 02/07/22 Potential to Achieve Goals: Fair Progress towards PT goals: Progressing toward goals    Frequency    Min  3X/week      PT Plan Current plan remains appropriate    Co-evaluation              AM-PAC PT "6 Clicks" Mobility   Outcome Measure  Help needed turning from your back to your side while in a flat bed without using bedrails?: A Lot Help needed moving from lying on your back to sitting on the side of a flat bed without using bedrails?: A Lot Help needed moving to and from a bed to a chair (including a wheelchair)?: A Lot Help needed standing up from a chair using your arms (e.g., wheelchair or bedside chair)?: A Lot Help needed to walk in hospital room?: Total Help needed climbing 3-5 steps with a railing? : Total 6 Click Score: 10    End of Session   Activity Tolerance: Patient tolerated treatment well;Patient limited by fatigue Patient left: in chair;with call bell/phone within reach;with chair alarm set (chair posey alarm) Nurse Communication: Mobility status PT Visit Diagnosis: Other abnormalities of gait and mobility (R26.89);Muscle weakness (generalized) (M62.81);Other symptoms and signs involving the nervous system (R29.898)     Time: 7341-9379 PT Time Calculation (min) (ACUTE ONLY): 24 min  Charges:  $Gait Training: 8-22 mins $Therapeutic Activity: 8-22 mins                     01/26/2022  Lindsey Daniels., PT Acute Rehabilitation Services [phone]  (office)   Lindsey Daniels Lindsey Daniels 01/26/2022, 10:41 AM

## 2022-01-26 NOTE — Progress Notes (Signed)
Left pupil larger than right - left is size 4/5 and right is 3 - both reactive. MD notified.  Pt oriented x 3 - disoriented to place.

## 2022-01-27 DIAGNOSIS — R569 Unspecified convulsions: Secondary | ICD-10-CM | POA: Diagnosis not present

## 2022-01-27 DIAGNOSIS — I159 Secondary hypertension, unspecified: Secondary | ICD-10-CM | POA: Diagnosis not present

## 2022-01-27 DIAGNOSIS — G40901 Epilepsy, unspecified, not intractable, with status epilepticus: Secondary | ICD-10-CM | POA: Diagnosis not present

## 2022-01-27 LAB — CBC
HCT: 42.2 % (ref 36.0–46.0)
Hemoglobin: 13.7 g/dL (ref 12.0–15.0)
MCH: 29.3 pg (ref 26.0–34.0)
MCHC: 32.5 g/dL (ref 30.0–36.0)
MCV: 90.4 fL (ref 80.0–100.0)
Platelets: 298 10*3/uL (ref 150–400)
RBC: 4.67 MIL/uL (ref 3.87–5.11)
RDW: 13.8 % (ref 11.5–15.5)
WBC: 13.9 10*3/uL — ABNORMAL HIGH (ref 4.0–10.5)
nRBC: 0 % (ref 0.0–0.2)

## 2022-01-27 LAB — GLUCOSE, CAPILLARY
Glucose-Capillary: 157 mg/dL — ABNORMAL HIGH (ref 70–99)
Glucose-Capillary: 110 mg/dL — ABNORMAL HIGH (ref 70–99)
Glucose-Capillary: 117 mg/dL — ABNORMAL HIGH (ref 70–99)
Glucose-Capillary: 159 mg/dL — ABNORMAL HIGH (ref 70–99)
Glucose-Capillary: 139 mg/dL — ABNORMAL HIGH (ref 70–99)
Glucose-Capillary: 159 mg/dL — ABNORMAL HIGH (ref 70–99)

## 2022-01-27 LAB — HEPARIN LEVEL (UNFRACTIONATED): Heparin Unfractionated: 0.39 IU/mL (ref 0.30–0.70)

## 2022-01-27 MED ORDER — LORAZEPAM 2 MG/ML IJ SOLN
0.5000 mg | Freq: Four times a day (QID) | INTRAMUSCULAR | Status: AC | PRN
  Administered 2022-01-27 – 2022-01-28 (×2): 0.5 mg via INTRAVENOUS
  Filled 2022-01-27 (×2): qty 1

## 2022-01-27 NOTE — Progress Notes (Signed)
Patient's insurance authorization is still pending at this time.  Madilyn Fireman, MSW, LCSW Transitions of Care  Clinical Social Worker II [phone]

## 2022-01-27 NOTE — Progress Notes (Signed)
ANTICOAGULATION CONSULT NOTE - Follow Up Consult  Pharmacy Consult for Heparin Indication:  History of PE (9/23)  Allergies  Allergen Reactions   Rifapentine Other (See Comments)    Flu-like symptoms   Amoxicillin Rash   Clavulanic Acid Rash    Patient Measurements: Height: '5\' 3"'$  (160 cm) Weight: 77 kg (169 lb 12.1 oz) IBW/kg (Calculated) : 52.4 Heparin Dosing Weight: 68 kg  Vital Signs: Temp: 97.6 F (36.4 C) (12/12 1130) Temp Source: Oral (12/12 1130) BP: 128/85 (12/12 1130) Pulse Rate: 86 (12/12 1130)  Labs: Recent Labs    01/25/22 0308 01/25/22 0318 01/25/22 0318 01/25/22 0642 01/25/22 1617 01/26/22 0034 01/26/22 0843 01/26/22 1852 01/27/22 0318  HGB 13.7  --   --   --   --  13.5  --   --  13.7  HCT 42.5  --   --   --   --  40.6  --   --  42.2  PLT 250  --   --   --   --  287  --   --  298  APTT  --  107*  --  113*  --   --   --   --   --   HEPARINUNFRC  --  >1.10*   < > >1.10*   < > 0.55 0.75* 0.43 0.39  CREATININE  --   --   --   --   --  0.51  --   --   --    < > = values in this interval not displayed.    Estimated Creatinine Clearance: 67 mL/min (by C-G formula based on SCr of 0.51 mg/dL).  Assessment: 66 YO female presented with concern for focal seizure and status epilepticus. On Eliquis at home for history of PE in September 2023. Pharmacy consulted to dose IV heparin while Eliquis is on hold (last dose reportedly 11/30 PTA).  Heparin level remains therapeutic at 0.39 on heparin at 600 units/hr. No issus with infusion or overt s/sx of bleeding reported. CBC remains stable.  Goal of Therapy:  Heparin level 0.3-0.7 units/ml Monitor platelets by anticoagulation protocol: Yes   Plan:  Continue heparin drip at 600 units/hr Monitor daily CBC, heparin levels, and s/sx of bleeding.    Arturo Morton, PharmD, BCPS Please check AMION for all [place] contact numbers Clinical Pharmacist 01/27/2022 1:55 PM

## 2022-01-27 NOTE — Progress Notes (Signed)
[facility]  PROGRESS NOTE  Lindsey Daniels MEQ:[id number] DOB: Jun 24, 1955 DOA: 01/17/2022 PCP: Harrison Mons, PA   Brief HPI:   67 y.o. female with medical history significant of seasonal allergy, anal fissure, anxiety, depression, cataracts, elevated LFTs, hyperglycemia, hyperlipidemia, hypertension, osteoarthritis pars ephedra lumbar spine, spondylolisthesis of the lumbar spine, partial seizures who presented to the emergency department via EMS due to having a focal seizure an hour before arrival.  Patient was seen in the ER with left-sided facial and LUE twitch.  Her LUE is weak at baseline, but she was able to lift up prior to the seizure.  She was recently admitted for seizure and discharged on 01/14/2022 after increasing Keppra and restarting dexamethasone.  Patient was found to be altered mental status and failed to respond due to increasing doses of antiepileptics.   Neurology was consulted,MRI brain had an acute change of abnormal diffusion in lateral R perirolandic cortex. cEEG with focal epileptogenicity.  She was transferred to ICU and intubated for airway protection. She was weaned off mechanical ventilation, propofol was stopped. TRH resumed care on 01/25/2022  Subjective    Patient became agitated last night, required sitter one-to-one.  This morning patient is lucid, alert, answering questions appropriately.   Assessment/Plan:   Focal status epilepticus -History of seizure/history of pleomorphic xanthoastrocytoma -S/p craniotomy in 2015 2022 with XRT -Continue Keppra, Onfi -LTM discontinued, neurology has signed off -Continue Decadron 4 mg IV every 12 hours -Patient can be discharged on Decadron 4 mg p.o. twice daily  S/p ? Fall on 12/10 - patient denied fall, says she slid out of bed -CT head obtained, showed no new changes  Delirium - patient was delirious last night - trying to climb out of bed; safety sitter was ordered -ativan 1 mg x 1 was given; no  haldol given due to prolonged Qtc > 500 - also had 7 beat run of vtach; mag was checked, came back normal - she is mentally clear today, ao x 3; will d/c safety sitter today - will monitor   Vomiting -Unclear etiology, likely hypertension -Patient antihypertensive medications were not resumed pharmacy -Zofran not helping, will discontinue Zofran -Started  IV Phenergan   Hypertension -Blood pressure was elevated -Resumed home medications for hypertension including amlodipine and Cozaar  Acute hypoxemic respiratory failure -Required intubation for airway protection -Patient was extubated on 12/8 -Failed swallow evaluation, feeding tube placed  Leukocytosis -wbc is up to 15000 -likely from steroids  Dysphagia -Patient has Cortrak feeding tube in place -Dysphagia 1 diet ordered per speech therapy -If diet can be advanced, will consider stopping feeding tube - for now continue nocturnal tube feeds  Hyperglycemia -Secondary to Decadron -Hemoglobin A1c is 5.3 -Continue Lantus, sliding scale insulin with NovoLog -CBG well-controlled    Depression - sertraline  History of pulmonary embolism -Continue heparin per pharmacy  Disposition -Patient to go to inpatient rehab     Medications     amLODipine  10 mg Oral Daily   cloBAZam  7.5 mg Oral BID   dexamethasone (DECADRON) injection  4 mg Intravenous Q12H   feeding supplement  237 mL Oral BID BM   insulin aspart  0-15 Units Subcutaneous Q4H   insulin glargine-yfgn  5 Units Subcutaneous BID   losartan  50 mg Oral Daily   mouth rinse  15 mL Mouth Rinse 4 times per day   pantoprazole  40 mg Oral QHS   sertraline  100 mg Oral Daily     Data Reviewed:  CBG:  Recent Labs  Lab 01/26/22 1617 01/26/22 1952 01/26/22 2254 01/27/22 0256 01/27/22 0731  GLUCAP 135* 118* 145* 157* 110*    SpO2: 97 % O2 Flow Rate (L/min): 2 L/min FiO2 (%): 40 %    Vitals:   01/26/22 2255 01/26/22 2256 01/27/22 0258 01/27/22  0735  BP: (!) 99/56 102/62 125/76 115/64  Pulse: 65 69 72 63  Resp: '12 14 19 16  '$ Temp:   97.8 F (36.6 C) 98.5 F (36.9 C)  TempSrc:   Oral Oral  SpO2: 96% 97% 97% 97%  Weight:      Height:          Data Reviewed:  Basic Metabolic Panel: Recent Labs  Lab 01/21/22 0410 01/22/22 0653 01/23/22 0532 01/26/22 0034 01/26/22 0843  NA  --   --  138 134*  --   K  --   --  4.3 4.2  --   CL  --   --  103 99  --   CO2  --   --  24 20*  --   GLUCOSE  --   --  179* 114*  --   BUN  --   --  17 24*  --   CREATININE  --   --  0.45 0.51  --   CALCIUM  --   --  9.0 9.1  --   MG 1.9 1.9  --   --  2.0  PHOS 3.2 4.0  --   --   --     CBC: Recent Labs  Lab 01/23/22 0532 01/24/22 0337 01/25/22 0308 01/26/22 0034 01/27/22 0318  WBC 13.3* 9.5 11.5* 15.5* 13.9*  HGB 13.9 13.2 13.7 13.5 13.7  HCT 42.7 39.9 42.5 40.6 42.2  MCV 91.4 89.7 90.8 90.0 90.4  PLT 268 246 250 287 298    LFT No results for input(s): "AST", "ALT", "ALKPHOS", "BILITOT", "PROT", "ALBUMIN" in the last 168 hours.    Antibiotics: Anti-infectives (From admission, onward)    None        DVT prophylaxis: Continue heparin  Code Status: Partial code  Family Communication: Discussed with patient daughter at bedside   CONSULTS PCCM, neurology   Objective    Physical Examination:  General-appears in no acute distress Heart-S1-S2, regular, no murmur auscultated Lungs-clear to auscultation bilaterally Abdomen-soft, nontender, no organomegaly Extremities-no edema in the lower extremities Neuro-alert, oriented x3, 4/5 motor strength in left upper extremity, chronic  Status is: Inpatient:             Lindsey Daniels   [facility] If 7PM-7AM, please contact night-coverage at [email or web address], Office  [phone]   01/27/2022, 9:08 AM  LOS: 9 days

## 2022-01-27 NOTE — Evaluation (Signed)
Occupational Therapy Evaluation Patient Details Name: Lindsey Daniels MRN: [id number] DOB: [birth year] Today's Date: 01/27/2022   History of Present Illness Pt is a 66 y/o female admitted 12/2 to ED via EMS due to having a focal seizure and hour before arrival.  In ED pt showed left sided facial and LUE twitch.  Pt is weak in the L UE at baseline.  PMHx:  crani for tumor x2, HTN, OA, seizures, spndylolisthesis   Clinical Impression   This 66 yo female admitted with above presents to acute OT with PLOF of living by herself at home. She currently is Mod -total A for basic ADLs at bed level, is not functionally using her LUE at all, and appears to have inattention to left side.  She will continue to benefit from acute OT with follow up at SNF.     Recommendations for follow up therapy are one component of a multi-disciplinary discharge planning process, led by the attending physician.  Recommendations may be updated based on patient status, additional functional criteria and insurance authorization.   Follow Up Recommendations  Skilled nursing-short term rehab (<3 hours/day)     Assistance Recommended at Discharge Frequent or constant Supervision/Assistance  Patient can return home with the following A lot of help with walking and/or transfers;A lot of help with bathing/dressing/bathroom;Assistance with cooking/housework;Assistance with feeding;Help with stairs or ramp for entrance;Assist for transportation;Direct supervision/assist for financial management;Direct supervision/assist for medications management    Functional Status Assessment  Patient has had a recent decline in their functional status and demonstrates the ability to make significant improvements in function in a reasonable and predictable amount of time.  Equipment Recommendations  Other (comment) (TBD next venue)       Precautions / Restrictions Precautions Precautions: Fall Restrictions Weight Bearing Restrictions: No              ADL either performed or assessed with clinical judgement   ADL Overall ADL's : Needs assistance/impaired Eating/Feeding: Moderate assistance Eating/Feeding Details (indicate cue type and reason): self feed, sitting up in bed with legs criss-crossed   Grooming Details (indicate cue type and reason): would spontaneously and intemittently pick up wash cloth or towel that was in lap to wipe mouth, but when handed a washcloth and asked to wipe mouth she would not. Upper Body Bathing: Total assistance;Bed level   Lower Body Bathing: Total assistance;Bed level   Upper Body Dressing : Total assistance;Bed level   Lower Body Dressing: Total assistance;Bed level                       Vision   Additional Comments: appears to have left visual field cut/inattention/neglect when observing her eating her lunch while seated up in bed            Pertinent Vitals/Pain Pain Assessment Pain Assessment: Faces Faces Pain Scale: No hurt     Hand Dominance Right   Extremity/Trunk Assessment Upper Extremity Assessment Upper Extremity Assessment: RUE deficits/detail;LUE deficits/detail RUE Deficits / Details: using RUE to self feed with fork, spoon and pick up cup to drink LUE Deficits / Details: Noted minimal spontaneous active movement and no movement on command: PROM WNL LUE Coordination: decreased gross motor;decreased fine motor           Communication Communication Communication: No difficulties   Cognition Arousal/Alertness: Awake/alert Behavior During Therapy: Flat affect, Impulsive Overall Cognitive Status: Impaired/Different from baseline Area of Impairment: Attention, Following commands, Safety/judgement, Awareness, Problem solving  Current Attention Level: Sustained   Following Commands: Follows one step commands inconsistently Safety/Judgement: Decreased awareness of safety, Decreased awareness of deficits Awareness:  Intellectual Problem Solving: Difficulty sequencing, Requires verbal cues, Requires tactile cues, Decreased initiation General Comments: Pt with low tone of voice, was completely oriented x4                Home Living Family/patient expects to be discharged to:: Skilled nursing facility Living Arrangements: Alone Available Help at Discharge: Family;Friend(s);Neighbor Type of Home: House Home Access: Stairs to enter [employer] of Steps: 1 small threshold Entrance Stairs-Rails: None Home Layout: One level     Bathroom Shower/Tub: Walk-in shower;Tub/shower Psychologist, forensic: Yes How Accessible: Accessible via walker Home Equipment: Cane - single point;Grab bars - tub/shower;BSC/3in1   Additional Comments: pt reports multiple friends and brother can help. pt mentions, Oswaldo Conroy.      Prior Functioning/Environment Prior Level of Function : Independent/Modified Independent                        OT Problem List: Decreased strength;Decreased range of motion;Impaired balance (sitting and/or standing);Impaired vision/perception;Decreased coordination;Decreased cognition;Decreased safety awareness;Impaired tone;Impaired UE functional use      OT Treatment/Interventions: Self-care/ADL training;Patient/family education;DME and/or AE instruction;Balance training;Visual/perceptual remediation/compensation;Therapeutic activities    OT Goals(Current goals can be found in the care plan section) Acute Rehab OT Goals Patient Stated Goal: pt unable to state OT Goal Formulation: Patient unable to participate in goal setting Time For Goal Achievement: 02/10/22 Potential to Achieve Goals: Fair  OT Frequency: Min 2X/week       AM-PAC OT "6 Clicks" Daily Activity     Outcome Measure Help from another person eating meals?: A Lot Help from another person taking care of personal grooming?: A Lot Help from another person  toileting, which includes using toliet, bedpan, or urinal?: Total Help from another person bathing (including washing, rinsing, drying)?: Total Help from another person to put on and taking off regular upper body clothing?: Total Help from another person to put on and taking off regular lower body clothing?: Total 6 Click Score: 4   End of Session    Activity Tolerance:  (pt limited by confusion) Patient left: in bed;with call bell/phone within reach;with bed alarm set;with restraints reapplied  OT Visit Diagnosis: Other abnormalities of gait and mobility (R26.89);Muscle weakness (generalized) (M62.81);Low vision, both eyes (H54.2);Feeding difficulties (R63.3);Other symptoms and signs involving cognitive function;Hemiplegia and hemiparesis Hemiplegia - Right/Left: Left Hemiplegia - dominant/non-dominant: Non-Dominant Hemiplegia - caused by:  (seizures, tumor removal in past)                Time: 1335-1400 OT Time Calculation (min): 25 min Charges:  OT General Charges $OT Visit: 1 Visit OT Evaluation $OT Eval Moderate Complexity: 1 Mod OT Treatments $Self Care/Home Management : 8-22 mins  [address], OTR/L Acute Rehab Services [facility] [phone] Office [phone]    Almon Register 01/27/2022, 4:23 PM

## 2022-01-28 DIAGNOSIS — G40901 Epilepsy, unspecified, not intractable, with status epilepticus: Secondary | ICD-10-CM | POA: Diagnosis not present

## 2022-01-28 LAB — CBC
HCT: 40 % (ref 36.0–46.0)
Hemoglobin: 14 g/dL (ref 12.0–15.0)
MCH: 30.2 pg (ref 26.0–34.0)
MCHC: 35 g/dL (ref 30.0–36.0)
MCV: 86.2 fL (ref 80.0–100.0)
Platelets: 297 10*3/uL (ref 150–400)
RBC: 4.64 MIL/uL (ref 3.87–5.11)
RDW: 13.6 % (ref 11.5–15.5)
WBC: 15.9 10*3/uL — ABNORMAL HIGH (ref 4.0–10.5)
nRBC: 0 % (ref 0.0–0.2)

## 2022-01-28 LAB — GLUCOSE, CAPILLARY
Glucose-Capillary: 151 mg/dL — ABNORMAL HIGH (ref 70–99)
Glucose-Capillary: 102 mg/dL — ABNORMAL HIGH (ref 70–99)
Glucose-Capillary: 108 mg/dL — ABNORMAL HIGH (ref 70–99)
Glucose-Capillary: 139 mg/dL — ABNORMAL HIGH (ref 70–99)
Glucose-Capillary: 137 mg/dL — ABNORMAL HIGH (ref 70–99)

## 2022-01-28 LAB — HEPARIN LEVEL (UNFRACTIONATED): Heparin Unfractionated: 0.36 IU/mL (ref 0.30–0.70)

## 2022-01-28 MED ORDER — LEVETIRACETAM 500 MG PO TABS
1000.0000 mg | ORAL_TABLET | Freq: Two times a day (BID) | ORAL | Status: DC
  Administered 2022-01-28 – 2022-01-29 (×2): 1000 mg via ORAL
  Filled 2022-01-28 (×2): qty 2

## 2022-01-28 MED ORDER — APIXABAN 5 MG PO TABS
5.0000 mg | ORAL_TABLET | Freq: Two times a day (BID) | ORAL | Status: DC
  Administered 2022-01-28 – 2022-01-29 (×3): 5 mg via ORAL
  Filled 2022-01-28 (×3): qty 1

## 2022-01-28 NOTE — Progress Notes (Signed)
   01/26/22 0132  Vitals  BP 135/89  MAP (mmHg) 102  BP Location Left Arm  BP Method Automatic  Patient Position (if appropriate) Lying  Pulse Rate Source Monitor  ECG Heart Rate 76  Resp (!) 21  MEWS Score  MEWS Temp 0  MEWS Systolic 0  MEWS Pulse 0  MEWS RR 1  MEWS LOC 0  MEWS Score 1  MEWS Score Color Green    Post fall vital signs, patient denies any pain.

## 2022-01-28 NOTE — Progress Notes (Signed)
[facility]  PROGRESS NOTE  Lindsey Daniels ATF:[id number] DOB: June 09, 1955 DOA: 01/17/2022 PCP: Harrison Mons, PA   Brief HPI:   66 y.o. female with medical history significant of seasonal allergy, anal fissure, anxiety, depression, cataracts, elevated LFTs, hyperglycemia, hyperlipidemia, hypertension, osteoarthritis pars ephedra lumbar spine, spondylolisthesis of the lumbar spine, partial seizures who presented to the emergency department via EMS due to having a focal seizure an hour before arrival.  Patient was seen in the ER with left-sided facial and LUE twitch.  Her LUE is weak at baseline, but she was able to lift up prior to the seizure.  She was recently admitted for seizure and discharged on 01/14/2022 after increasing Keppra and restarting dexamethasone.  Patient was found to be altered mental status and failed to respond due to increasing doses of antiepileptics.   Neurology was consulted,  MRI brain had an acute change of abnormal diffusion in lateral R perirolandic cortex. cEEG with focal epileptogenicity.  She was transferred to ICU and intubated for airway protection. She was weaned off mechanical ventilation, propofol was stopped. 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 twice daily upon d/c  S/p ? Fall on 12/10 - patient denied fall, says she slid out of bed -CT head obtained, showed no new changes  Delirium - delirium precautions   Vomiting -Unclear etiology -Zofran not helping, will discontinue Zofran -Started  IV Phenergan  Hypertension -Blood pressure was elevated -Resumed home medications for hypertension including amlodipine  and Cozaar  Acute hypoxemic respiratory failure -Required intubation for airway protection -Patient was extubated on 12/8  Leukocytosis -wbc is up to 15000 -likely from steroids  Dysphagia -Patient has Cortrak feeding tube in place -Dysphagia 1 diet ordered per speech therapy -tube feeding per dietician   Hyperglycemia -Secondary to Decadron -Hemoglobin A1c is 5.3 -Continue Lantus, sliding scale insulin with NovoLog -CBG well-controlled   Depression - sertraline  History of pulmonary embolism -resume eliquis  Disposition -SNF Pending  Obesity Estimated body mass index is 30.07 kg/m as calculated from the following:   Height as of this encounter: '5\' 3"'$  (1.6 m).   Weight as of this encounter: 77 kg.    Medications     amLODipine  10 mg Oral Daily   cloBAZam  7.5 mg Oral BID   dexamethasone (DECADRON) injection  4 mg Intravenous Q12H   feeding supplement  237 mL Oral BID BM   insulin aspart  0-15 Units Subcutaneous Q4H   insulin glargine-yfgn  5 Units Subcutaneous BID   losartan  50 mg Oral Daily   mouth rinse  15 mL Mouth Rinse 4 times per day   pantoprazole  40 mg Oral QHS   sertraline  100 mg Oral Daily     Data Reviewed:   CBG:  Recent Labs  Lab 01/27/22 1557 01/27/22 1942 01/27/22 2310 01/28/22 0306 01/28/22 0811  GLUCAP 159* 139* 159* 151* 102*    SpO2: 95 % O2 Flow Rate (L/min): 2 L/min FiO2 (%): 40 %    Vitals:   01/27/22 1925 01/27/22 2316 01/28/22 0304 01/28/22 0736  BP: 136/75 125/87 (!) 145/71 (!) 134/98  Pulse:  84  86  Resp: 18 (!)  $'22 16 18  'r$ Temp: 98.1 F (36.7 C) 98.1 F (36.7 C) 98 F (36.7 C) 98.2 F (36.8 C)  TempSrc: Oral Axillary Oral Oral  SpO2: 95% 95% 94% 95%  Weight:      Height:          Data Reviewed:  Basic Metabolic Panel: Recent Labs  Lab 01/22/22 0653 01/23/22 0532 01/26/22 0034 01/26/22 0843  NA  --  138 134*  --   K  --  4.3 4.2  --   CL  --  103 99  --   CO2  --  24 20*  --   GLUCOSE   --  179* 114*  --   BUN  --  17 24*  --   CREATININE  --  0.45 0.51  --   CALCIUM  --  9.0 9.1  --   MG 1.9  --   --  2.0  PHOS 4.0  --   --   --     CBC: Recent Labs  Lab 01/24/22 0337 01/25/22 0308 01/26/22 0034 01/27/22 0318 01/28/22 0444  WBC 9.5 11.5* 15.5* 13.9* 15.9*  HGB 13.2 13.7 13.5 13.7 14.0  HCT 39.9 42.5 40.6 42.2 40.0  MCV 89.7 90.8 90.0 90.4 86.2  PLT 246 250 287 298 297    LFT No results for input(s): "AST", "ALT", "ALKPHOS", "BILITOT", "PROT", "ALBUMIN" in the last 168 hours.     DVT prophylaxis: eliquis  Code Status: Partial code  Family Communication:    CONSULTS PCCM, neurology   Objective    Physical Examination:   General: Appearance:    Obese female in no acute distress, cortrack in place     Lungs:      respirations unlabored  Heart:    Normal heart rate. Normal rhythm. No murmurs, rubs, or gallops.   MS:   All extremities are intact.   Neurologic:   Awake, alert- speech slow but she does follow commands     Status is: Inpatient:             Geradine Girt   [facility] If 7PM-7AM, please contact night-coverage at [email or web address], Office  [phone]   01/28/2022, 9:38 AM  LOS: 10 days

## 2022-01-28 NOTE — Progress Notes (Signed)
Physical Therapy Treatment Patient Details Name: Lindsey Daniels MRN: [id number] DOB: [birth year] Today's Date: 01/28/2022   History of Present Illness Pt is a 66 y/o female admitted 12/2 to ED via EMS due to having a focal seizure and hour before arrival.  In ED pt showed left sided facial and LUE twitch.  Pt is weak in the L UE at baseline.  PMHx:  crani for tumor x2, HTN, OA, seizures, spndylolisthesis    PT Comments    Pt progressing steadily toward goals, mentation still "off" due to seizure and ?beginning of delirium.  Emphasis on warm up, transition to EOB, sitting balance progressing to no UE's, sit to stand into the RW and short distance, in room gait ending in the recliner.    Recommendations for follow up therapy are one component of a multi-disciplinary discharge planning process, led by the attending physician.  Recommendations may be updated based on patient status, additional functional criteria and insurance authorization.  Follow Up Recommendations  Acute inpatient rehab (3hours/day)     Assistance Recommended at Discharge Frequent or constant Supervision/Assistance  Patient can return home with the following A little help with walking and/or transfers;A little help with bathing/dressing/bathroom;Assistance with cooking/housework;Assist for transportation;Help with stairs or ramp for entrance   Equipment Recommendations  Other (comment) (TBD)    Recommendations for Other Services       Precautions / Restrictions Precautions Precautions: Fall     Mobility  Bed Mobility Overal bed mobility: Needs Assistance Bed Mobility: Supine to Sit     Supine to sit: Mod assist     General bed mobility comments: cues and LE assist for pt initiation help.  truncal assist up via R elbow.  No assist noted from pt's L UE.    Transfers Overall transfer level: Needs assistance   Transfers: Sit to/from Stand Sit to Stand: Mod assist, +2 physical assistance, +2  safety/equipment           General transfer comment: pt needed cues for hand placement/direction, assist forward and with boost.  Stability in standing.    Ambulation/Gait Ambulation/Gait assistance: Mod assist Gait Distance (Feet): 10 Feet (x1, then 8 feet to the recliner with the RW) Assistive device: Rolling walker (2 wheels) Gait Pattern/deviations: Step-through pattern, Decreased step length - right, Decreased step length - left, Decreased stance time - left, Decreased stride length, Narrow base of support       General Gait Details: needed consistent cuing for sequencing, stability assist, maneuvering of the RW, postural assist.  Pt needing more cues to redirect for some loss of focus or goals not "jiving" between therapist and pt at the time.   Stairs             Wheelchair Mobility    Modified Rankin (Stroke Patients Only)       Balance Overall balance assessment: Needs assistance Sitting-balance support: Single extremity supported, No upper extremity supported, Feet supported Sitting balance-Leahy Scale: Fair     Standing balance support: Single extremity supported, Bilateral upper extremity supported Standing balance-Leahy Scale: Poor Standing balance comment: pt reliant on AD and external support.                            Cognition Arousal/Alertness: Awake/alert, Lethargic Behavior During Therapy: Impulsive, Flat affect, WFL for tasks assessed/performed Overall Cognitive Status:  (NT formally, but not fully safety aware.)  Exercises      General Comments General comments (skin integrity, edema, etc.): vss      Pertinent Vitals/Pain Pain Assessment Pain Assessment: Faces Faces Pain Scale: Hurts a little bit Pain Location: L side of head Pain Descriptors / Indicators: Sore Pain Intervention(s): Monitored during session    Home Living                           Prior Function            PT Goals (current goals can now be found in the care plan section) Acute Rehab PT Goals Patient Stated Goal: pt not focused on goals PT Goal Formulation: With patient Time For Goal Achievement: 02/07/22 Potential to Achieve Goals: Fair Progress towards PT goals: Progressing toward goals    Frequency    Min 3X/week      PT Plan Current plan remains appropriate    Co-evaluation              AM-PAC PT "6 Clicks" Mobility   Outcome Measure  Help needed turning from your back to your side while in a flat bed without using bedrails?: A Lot Help needed moving from lying on your back to sitting on the side of a flat bed without using bedrails?: A Lot Help needed moving to and from a bed to a chair (including a wheelchair)?: A Lot Help needed standing up from a chair using your arms (e.g., wheelchair or bedside chair)?: A Lot Help needed to walk in hospital room?: Total Help needed climbing 3-5 steps with a railing? : Total 6 Click Score: 10    End of Session   Activity Tolerance: Patient tolerated treatment well Patient left: in chair;with call bell/phone within reach;with chair alarm set (chair posey alarm) Nurse Communication: Mobility status PT Visit Diagnosis: Other abnormalities of gait and mobility (R26.89);Muscle weakness (generalized) (M62.81);Other symptoms and signs involving the nervous system (R29.898)     Time: 9629-5284 PT Time Calculation (min) (ACUTE ONLY): 23 min  Charges:  $Gait Training: 8-22 mins $Therapeutic Activity: 8-22 mins                     01/28/2022  Ginger Carne., PT Acute Rehabilitation Services [phone]  (office)   Tessie Fass Biana Haggar 01/28/2022, 5:37 PM

## 2022-01-28 NOTE — Progress Notes (Addendum)
ANTICOAGULATION CONSULT NOTE - Initial Consult  Pharmacy Consult for Transition from Heparin to Eliquis Indication:  History of PE  Allergies  Allergen Reactions   Rifapentine Other (See Comments)    Flu-like symptoms   Amoxicillin Rash   Clavulanic Acid Rash    Patient Measurements: Height: '5\' 3"'$  (160 cm) Weight: 77 kg (169 lb 12.1 oz) IBW/kg (Calculated) : 52.4 Heparin Dosing Weight: 68 kg  Vital Signs: Temp: 98.2 F (36.8 C) (12/13 0736) Temp Source: Oral (12/13 0736) BP: 134/98 (12/13 0736) Pulse Rate: 86 (12/13 0736)  Labs: Recent Labs    01/26/22 0034 01/26/22 0843 01/26/22 1852 01/27/22 0318 01/28/22 0444  HGB 13.5  --   --  13.7 14.0  HCT 40.6  --   --  42.2 40.0  PLT 287  --   --  298 297  HEPARINUNFRC 0.55   < > 0.43 0.39 0.36  CREATININE 0.51  --   --   --   --    < > = values in this interval not displayed.    Estimated Creatinine Clearance: 67 mL/min (by C-G formula based on SCr of 0.51 mg/dL).   Medical History: Past Medical History:  Diagnosis Date   Allergy    Anal fissure    Anxiety    Cataract    Depression    Elevated LFTs    Hyperglycemia    Hyperlipidemia    Hypertension    Osteoarthritis    Pars defect of lumbar spine    Seizures (HCC)    Spondylolisthesis    lumbar    Medications:  Heparin infusion 600 units/hr  Assessment: 66 YOF presented w concern for focal seizure and status epilepticus. On Eliquis at home for history of PE in September 2023. Pharmacy consulted to dose IV heparin while Eliquis is on hold (last dose reportedly 11/30 PTA).   Heparin level remained therapeutic at 0.36 on heparin 600 units/hr. No s/sx of bleeding and CBC remains stable. Transitioning back to home dose of Eliquis 5 mg BID.   Goal of Therapy:  Monitor platelets by anticoagulation protocol: Yes   Plan:  Discontinue heparin drip. Start Eliquis 5 mg by mouth twice daily once heparin discontinued. Communicated transition plan with  RN. Monitor daily CBC and s/sx of bleeding.  Raynald Blend, PharmD Candidate 01/28/2022,10:24 AM

## 2022-01-29 LAB — CBC
HCT: 43.2 % (ref 36.0–46.0)
Hemoglobin: 14.5 g/dL (ref 12.0–15.0)
MCH: 29.7 pg (ref 26.0–34.0)
MCHC: 33.6 g/dL (ref 30.0–36.0)
MCV: 88.3 fL (ref 80.0–100.0)
Platelets: 287 10*3/uL (ref 150–400)
RBC: 4.89 MIL/uL (ref 3.87–5.11)
RDW: 13.6 % (ref 11.5–15.5)
WBC: 15.5 10*3/uL — ABNORMAL HIGH (ref 4.0–10.5)
nRBC: 0 % (ref 0.0–0.2)

## 2022-01-29 LAB — BASIC METABOLIC PANEL
Anion gap: 12 (ref 5–15)
BUN: 23 mg/dL (ref 8–23)
CO2: 23 mmol/L (ref 22–32)
Calcium: 9.4 mg/dL (ref 8.9–10.3)
Chloride: 102 mmol/L (ref 98–111)
Creatinine, Ser: 0.5 mg/dL (ref 0.44–1.00)
GFR, Estimated: >60 mL/min (ref >60)
Glucose, Bld: 109 mg/dL — ABNORMAL HIGH (ref 70–99)
Potassium: 4.5 mmol/L (ref 3.5–5.1)
Sodium: 137 mmol/L (ref 135–145)

## 2022-01-29 LAB — GLUCOSE, CAPILLARY
Glucose-Capillary: 101 mg/dL — ABNORMAL HIGH (ref 70–99)
Glucose-Capillary: 123 mg/dL — ABNORMAL HIGH (ref 70–99)
Glucose-Capillary: 87 mg/dL (ref 70–99)
Glucose-Capillary: 117 mg/dL — ABNORMAL HIGH (ref 70–99)
Glucose-Capillary: 117 mg/dL — ABNORMAL HIGH (ref 70–99)

## 2022-01-29 MED ORDER — LEVETIRACETAM 1000 MG PO TABS: 1000.0000 mg | ORAL_TABLET | Freq: Two times a day (BID) | ORAL | Status: DC

## 2022-01-29 MED ORDER — DOCUSATE SODIUM 100 MG PO CAPS: 100.0000 mg | ORAL_CAPSULE | Freq: Two times a day (BID) | ORAL | 0 refills | Status: DC | PRN

## 2022-01-29 MED ORDER — VALACYCLOVIR HCL 500 MG PO TABS: 500.0000 mg | ORAL_TABLET | Freq: Two times a day (BID) | ORAL | Status: DC | PRN

## 2022-01-29 MED ORDER — CLOBAZAM 2.5 MG/ML PO SUSP: 7.5000 mg | Freq: Two times a day (BID) | ORAL | 0 refills | Status: DC

## 2022-01-29 MED ORDER — ENSURE ENLIVE PO LIQD: 237.0000 mL | Freq: Two times a day (BID) | ORAL | 12 refills | Status: DC

## 2022-01-29 NOTE — Plan of Care (Signed)
  Problem: Activity: Goal: Ability to tolerate increased activity will improve Outcome: Adequate for Discharge   Problem: Respiratory: Goal: Ability to maintain a clear airway and adequate ventilation will improve Outcome: Adequate for Discharge   Problem: Role Relationship: Goal: Method of communication will improve Outcome: Adequate for Discharge   Problem: Education: Goal: Knowledge of General Education information will improve Description: Including pain rating scale, medication(s)/side effects and non-pharmacologic comfort measures Outcome: Adequate for Discharge   Problem: Health Behavior/Discharge Planning: Goal: Ability to manage health-related needs will improve Outcome: Adequate for Discharge   Problem: Clinical Measurements: Goal: Ability to maintain clinical measurements within normal limits will improve Outcome: Adequate for Discharge Goal: Will remain free from infection Outcome: Adequate for Discharge Goal: Diagnostic test results will improve Outcome: Adequate for Discharge Goal: Respiratory complications will improve Outcome: Adequate for Discharge Goal: Cardiovascular complication will be avoided Outcome: Adequate for Discharge   Problem: Activity: Goal: Risk for activity intolerance will decrease Outcome: Adequate for Discharge   Problem: Nutrition: Goal: Adequate nutrition will be maintained Outcome: Adequate for Discharge   Problem: Coping: Goal: Level of anxiety will decrease Outcome: Adequate for Discharge   Problem: Elimination: Goal: Will not experience complications related to bowel motility Outcome: Adequate for Discharge Goal: Will not experience complications related to urinary retention Outcome: Adequate for Discharge   Problem: Pain Managment: Goal: General experience of comfort will improve Outcome: Adequate for Discharge   Problem: Safety: Goal: Ability to remain free from injury will improve Outcome: Adequate for Discharge    Problem: Skin Integrity: Goal: Risk for impaired skin integrity will decrease Outcome: Adequate for Discharge   Problem: Education: Goal: Ability to describe self-care measures that may prevent or decrease complications (Diabetes Survival Skills Education) will improve Outcome: Adequate for Discharge Goal: Individualized Educational Video(s) Outcome: Adequate for Discharge   Problem: Coping: Goal: Ability to adjust to condition or change in health will improve Outcome: Adequate for Discharge   Problem: Fluid Volume: Goal: Ability to maintain a balanced intake and output will improve Outcome: Adequate for Discharge   Problem: Health Behavior/Discharge Planning: Goal: Ability to identify and utilize available resources and services will improve Outcome: Adequate for Discharge Goal: Ability to manage health-related needs will improve Outcome: Adequate for Discharge   Problem: Metabolic: Goal: Ability to maintain appropriate glucose levels will improve Outcome: Adequate for Discharge   Problem: Nutritional: Goal: Maintenance of adequate nutrition will improve Outcome: Adequate for Discharge Goal: Progress toward achieving an optimal weight will improve Outcome: Adequate for Discharge   Problem: Skin Integrity: Goal: Risk for impaired skin integrity will decrease Outcome: Adequate for Discharge   Problem: Tissue Perfusion: Goal: Adequacy of tissue perfusion will improve Outcome: Adequate for Discharge

## 2022-01-29 NOTE — TOC Transition Note (Signed)
Transition of Care [facility]) - CM/SW Discharge Note   Patient Details  Name: Tailor Lucking MRN: [id number] Date of Birth: [birth year]  [facility]) CM/SW Contact:  Vinie Sill, LCSW Phone Number: 01/29/2022, 11:36 AM   Clinical Narrative:     Patient will Discharge to: [place] Discharge Date: 01/29/2022 Family Notified: brother Transport OB:[id number]  Per MD patient is ready for discharge. RN, patient, and facility notified of discharge. Discharge Summary sent to facility. RN given number for [phone] Room 128-A. Ambulance transport requested for patient.   Clinical Social Worker signing off.  Thurmond Butts, MSW, LCSW Clinical Social Worker     Final next level of care: Skilled Nursing Facility Barriers to Discharge: Barriers Resolved   Patient Goals and CMS Choice        Discharge Placement              Patient chooses bed at: [facility] Patient to be transferred to facility by: [place] Name of family member notified: brother Patient and family notified of of transfer: 01/29/22  Discharge Plan and Services                                     Social Determinants of Health (SDOH) Interventions     Readmission Risk Interventions     No data to display

## 2022-01-29 NOTE — Progress Notes (Signed)
Report called to [facility] at [facility].  All questions answered.  Pt awaiting transport via [facility].  Brother, Wes, at bedside.  Will continue to monitor until discharge.

## 2022-01-29 NOTE — Discharge Summary (Signed)
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  Diet recommendation: DYS 1  Wound care: None.  Code status: DNR.   History of Present Illness:   66 y.o. female with medical history significant of seasonal allergy, anal fissure, anxiety, depression, cataracts, elevated LFTs, hyperglycemia, hyperlipidemia, hypertension, osteoarthritis pars ephedra lumbar spine, spondylolisthesis of the lumbar spine, partial seizures who presented to the emergency department via EMS due to having a focal seizure an hour before arrival.  Patient was seen in the ER with left-sided facial and LUE twitch.  Her LUE is weak at baseline, but she was able to lift up prior to the seizure.  She was recently admitted for seizure and discharged on 01/14/2022 after increasing Keppra and restarting dexamethasone.  Patient was found to be altered mental status and failed to respond due to increasing doses of antiepileptics.   Neurology was consulted,  MRI brain had an acute change of abnormal diffusion in lateral R perirolandic cortex. cEEG with focal epileptogenicity.  She was transferred to ICU and intubated for airway protection. She was weaned off mechanical ventilation, propofol was stopped. 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 twice daily upon d/c   S/p ? Fall on 12/10 - patient denied fall, says she slid out of bed -CT head obtained, showed no new changes   Delirium - delirium precautions    Vomiting -Unclear etiology -resolved   Hypertension -Blood pressure was elevated -Resumed home medications for hypertension including amlodipine and Cozaar   Acute hypoxemic respiratory failure -Required intubation for airway protection -Patient was extubated on 12/8   Leukocytosis -likely from steroids   Dysphagia -Dysphagia 1 diet ordered per speech therapy -advance as tolerated    Hyperglycemia -Secondary to Decadron -Hemoglobin A1c is 5.3 -monitor PRN    Depression - sertraline   History of pulmonary embolism -resume eliquis   Disposition -SNF Pending   Obesity Estimated body mass index is 30.07 kg/m as calculated from the following:   Height as of this encounter: '5\' 3"'$  (1.6 m).   Weight as of this encounter: 77 kg.       Medical Consultants:   neurology   Discharge Exam:   Vitals:   01/28/22 2300 01/29/22 0753  BP:  (!) 104/55  Pulse:  61  Resp:  14  Temp: 98.7 F (37.1 C) 97.7 F (36.5 C)  SpO2:  97%   Vitals:   01/28/22 1705 01/28/22 2300 01/29/22 0500 01/29/22 0753  BP: 115/82   (!) 104/55  Pulse:    61  Resp: (!) 21   14  Temp:  98.7 F (  37.1 C)  97.7 F (36.5 C)  TempSrc:  Axillary  Oral  SpO2:    97%  Weight:   65.9 kg   Height:        General exam: Appears calm and comfortable.    The results of significant diagnostics from this hospitalization (including imaging, microbiology, ancillary and laboratory) are listed below for reference.     Procedures and Diagnostic Studies:   CT HEAD WO  CONTRAST (5MM)  Result Date: 01/18/2022 CLINICAL DATA:  Seizure, history of tumor EXAM: CT HEAD WITHOUT CONTRAST TECHNIQUE: Contiguous axial images were obtained from the base of the skull through the vertex without intravenous contrast. RADIATION DOSE REDUCTION: This exam was performed according to the departmental dose-optimization program which includes automated exposure control, adjustment of the mA and/or kV according to patient size and/or use of iterative reconstruction technique. COMPARISON:  01/13/2022, MRI 12/24/2021 FINDINGS: Brain: Again seen is edema within the right temporal and parietal lobes. This is unchanged since prior study. Cystic right temporal lobe lesion is better seen on prior MRI. No mass effect or midline shift. No hemorrhage or acute infarction. No hydrocephalus. Vascular: No hyperdense vessel or unexpected calcification. Skull: Prior right temporal craniotomy. No acute calvarial abnormality. Sinuses/Orbits: No acute findings Other: None IMPRESSION: Right temporal cystic lesion better seen on prior MRI. Edema within the right cerebral hemisphere, unchanged. No acute intracranial abnormality. Electronically Signed   By: Rolm Baptise M.D.   On: 01/18/2022 21:12   DG Abd 1 View  Result Date: 01/18/2022 CLINICAL DATA:  Gastrointestinal tube placement. EXAM: ABDOMEN - 1 VIEW COMPARISON:  None Available. FINDINGS: Side port of the NG tube is in the stomach. Stomach is collapsed. Bowel gas pattern is normal. IMPRESSION: Side port of the NG tube is in the stomach. Electronically Signed   By: San Morelle M.D.   On: 01/18/2022 13:00   Portable Chest x-ray  Result Date: 01/18/2022 CLINICAL DATA:  Endotracheal tube present. EXAM: PORTABLE CHEST 1 VIEW COMPARISON:  One-view chest x-ray 01/17/2022 FINDINGS: Endotracheal tube terminates 3.4 cm above the carina. Progressive interstitial and airspace disease is present bilaterally, right greater than left. IMPRESSION: 1. Endotracheal  tube terminates 3.4 cm above the carina. 2. Progressive interstitial and airspace disease bilaterally, right greater than left. Electronically Signed   By: San Morelle M.D.   On: 01/18/2022 13:00   Overnight EEG with video  Result Date: 01/18/2022 Samuella Cota, MD     01/18/2022  8:16 AM EEG Procedure CPT/Type of Study: 01027; 2-12hr EEG with video Referring Provider: Avon Gully Primary Neurological Diagnosis: seizures History: This is a 66 yr old patient, undergoing an EEG to evaluate for seizures, status epilepticus. Clinical State: disoriented Technical Description: The EEG was performed using standard setting per the guidelines of American Clinical Neurophysiology Society (ACNS). A minimum of 21 electrodes were placed on scalp according to the International 10-20 or/and 10-10 Systems. Supplemental electrodes were placed as needed. Single EKG electrode was also used to detect cardiac arrhythmia. Patient's behavior was continuously recorded on video simultaneously with EEG. A minimum of 16 channels were used for data display. Each epoch of study was reviewed manually daily and as needed using standard referential and bipolar montages. Computerized quantitative EEG analysis (such as compressed spectral array analysis, trending, automated spike & seizure detection) were used as indicated. Day 1: from 0055 01/18/22 to 0730 01/18/22 EEG Description: Overall Amplitude:Normal Predominant Frequency: The background activity showed theta and some delta, 3-'6Hz'$ , occurring frequently. Superimposed Frequencies: sparse beta  activity, decreased on the right The background was asymmetric, decreased fast on the right Background Abnormalities: focal slowing, right centro-temporal focal delta slowing Rhythmic or periodic pattern: Yes; LPDs in the right centro-temporal region occurring frequently, 1-'2Hz'$  frequency with overriding fast activity Epileptiform activity: Yes; right central spiky morphology with subtle evolution  Electrographic seizures: Yes; subtle evolution to LPDs with brief runs lasting about 10 seconds duration with buildup in frequency of LPDs to '3Hz'$ , followed by periods of voltage attenuation. Events: no Breach rhythm: no Reactivity: Present Stimulation procedures: Hyperventilation: not done Photic stimulation: no change Sleep Background: Stage II EKG: irregular rhythm Impression: This was an abnormal continuous video EEG due to right central spiky LPDs with subtle ictal evolution, indicative of ongoing focal epileptogenicity in that region.   EEG adult  Result Date: 01/18/2022 Kennon Portela, MD     01/18/2022  3:41 AM [facility] Stat EEG Report Demographics: Patient Name:   Cerita, Rabelo Date of Birth:   [birth year] Identification Number:   MRN - [id number] Study Times: Study Start Time:   01/17/2022 23:20:21 Study End Time:   01/18/2022 00:52:54 Indication(s): Seizures Technical Summary: This EEG was performed utilizing standard International 10-20 System of electrode placement. One channel electrocardiogram was monitored. Data were obtained, stored, and interpreted utilizing referential montage recording, with reformatting to longitudinal, transverse bipolar, and referential montages as necessary for interpretation. State(s):       Awake      Drowsy      Asleep Activation Procedures: Photic Stimulation: EEG Description: The posterior dominant rhythm on the left hemisphere shows diffuse 4-5 hz theta slowing in the left with very severe diffuse 1-3 hz delta in the right. Frequent periodic lateralized epileptiform discharges (PLEDS) are noted in the T8 lead, at times mildly rhythmic in nature. Sleep architecture seen with sleep spindles, that are asymmetric and mainly left hemispheric. Impression: This is an abnormal EEG due to the presence of (1) a global encephalopathy of metabolic, toxic, infectious, post-ictal etiology. (2) Superimposed frequent 1-2 second  PLEDS are seen in the right temporal region indicative of strong epileptiform potential in that area. No obvious focal electrographic seizures seen or cortical spread, but would consider escalation of anti-seizure medications as the potential is strongly present. Clinical correlation is advised. Dr Kennon Portela [facility] For Inpatient follow-up with [facility] physician please call [facility] [phone]. This is not an outpatient service. Post hospital discharge, please contact hospital directly.   MR BRAIN W WO CONTRAST  Result Date: 01/17/2022 CLINICAL DATA:  66 year old female with seizure like activity. Left side face and extremity twitching. History of treated PXA with IMRT January in February this year. Progressive tumoral edema and intracranial mass effect in August, subsequent Avastin therapy completed 12/16/2021. EXAM: MRI HEAD WITHOUT AND WITH CONTRAST TECHNIQUE: Multiplanar, multiecho pulse sequences of the brain and surrounding structures were obtained without and with intravenous contrast. CONTRAST:  7.1m GADAVIST GADOBUTROL 1 MMOL/ML IV SOLN COMPARISON:  Restaging MRI 12/24/2021 and earlier. FINDINGS: Brain: Study is intermittently degraded by motion artifact despite repeated imaging attempts. Conspicuous new perirolandic gyral diffusion abnormality along the right lateral central sulcus (series 5, image 37), does appear restricted on ADC. But no significant cortical enlargement, and little edema the on the affected cortex. Underlying ongoing right hemisphere T2 and FLAIR hyperintensity compatible with tumoral edema which involves the deep white matter capsules, dorsal right thalamus as before. T2 shine through in those areas. Posterior right temporal lobe resection cavity with stable marginal and regional enhancement since  12/24/2021, substantially decreased compared to August. No increasing mass effect, and patency of the right lateral ventricle has mildly increased since August. No  midline shift. No ventriculomegaly. No acute intracranial hemorrhage. Negative pituitary and cervicomedullary junction. other diffusion abnormality or other new signal abnormality. Vascular: Major intracranial vascular flow voids are stable. Major dural venous sinuses appear to be enhancing as expected. Skull and upper cervical spine: Negative. Sinuses/Orbits: Negative. Other: Mastoids remain well aerated.  Stable visible scalp and face. IMPRESSION: 1. Mildly motion degraded exam today. 2. Acutely abnormal diffusion in the lateral right perirolandic cortex, favor Seizure related in this setting, with no associated enhancement or regional mass effect. 3. Otherwise stable MRI appearance of the brain status post are Avastin therapy. Tumoral enhancement and mass effect in the right hemisphere remain decreased compared to August. Electronically Signed   By: Genevie Ann M.D.   On: 01/17/2022 15:21   DG Chest Portable 1 View  Result Date: 01/17/2022 CLINICAL DATA:  Seizure EXAM: PORTABLE CHEST 1 VIEW COMPARISON:  09/22/2021 FINDINGS: Stable cardiomediastinal contours. No focal airspace consolidation, pleural effusion, or pneumothorax. IMPRESSION: No active disease. Electronically Signed   By: Davina Poke D.O.   On: 01/17/2022 14:19     Labs:   Basic Metabolic Panel: Recent Labs  Lab 01/23/22 0532 01/26/22 0034 01/26/22 0843 01/29/22 0433  NA 138 134*  --  137  K 4.3 4.2  --  4.5  CL 103 99  --  102  CO2 24 20*  --  23  GLUCOSE 179* 114*  --  109*  BUN 17 24*  --  23  CREATININE 0.45 0.51  --  0.50  CALCIUM 9.0 9.1  --  9.4  MG  --   --  2.0  --    GFR Estimated Creatinine Clearance: 62.3 mL/min (by C-G formula based on SCr of 0.5 mg/dL). Liver Function Tests: No results for input(s): "AST", "ALT", "ALKPHOS", "BILITOT", "PROT", "ALBUMIN" in the last 168 hours. No results for input(s): "LIPASE", "AMYLASE" in the last 168 hours. No results for input(s): "AMMONIA" in the last 168  hours. Coagulation profile No results for input(s): "INR", "PROTIME" in the last 168 hours.  CBC: Recent Labs  Lab 01/25/22 0308 01/26/22 0034 01/27/22 0318 01/28/22 0444 01/29/22 0433  WBC 11.5* 15.5* 13.9* 15.9* 15.5*  HGB 13.7 13.5 13.7 14.0 14.5  HCT 42.5 40.6 42.2 40.0 43.2  MCV 90.8 90.0 90.4 86.2 88.3  PLT 250 287 298 297 287   Cardiac Enzymes: No results for input(s): "CKTOTAL", "CKMB", "CKMBINDEX", "TROPONINI" in the last 168 hours. BNP: Invalid input(s): "POCBNP" CBG: Recent Labs  Lab 01/28/22 1630 01/28/22 1936 01/29/22 0012 01/29/22 0323 01/29/22 0751  GLUCAP 139* 137* 101* 123* 87   D-Dimer No results for input(s): "DDIMER" in the last 72 hours. Hgb A1c No results for input(s): "HGBA1C" in the last 72 hours. Lipid Profile No results for input(s): "CHOL", "HDL", "LDLCALC", "TRIG", "CHOLHDL", "LDLDIRECT" in the last 72 hours. Thyroid function studies No results for input(s): "TSH", "T4TOTAL", "T3FREE", "THYROIDAB" in the last 72 hours.  Invalid input(s): "FREET3" Anemia work up No results for input(s): "VITAMINB12", "FOLATE", "FERRITIN", "TIBC", "IRON", "RETICCTPCT" in the last 72 hours. Microbiology No results found for this or any previous visit (from the past 240 hour(s)).   Discharge Instructions:   Discharge Instructions     Discharge instructions   Complete by: As directed    DYS 1   Increase activity slowly   Complete by: As directed  Allergies as of 01/29/2022       Reactions   Rifapentine Other (See Comments)   Flu-like symptoms   Amoxicillin Rash   Clavulanic Acid Rash        Medication List     TAKE these medications    amLODipine 10 MG tablet Commonly known as: NORVASC Take 1 tablet (10 mg total) by mouth daily.   apixaban 5 MG Tabs tablet Commonly known as: ELIQUIS Take 1 tablet (5 mg total) by mouth 2 (two) times daily.   atorvastatin 20 MG tablet Commonly known as: LIPITOR Take 1 tablet (20 mg total)  by mouth daily.   cetirizine 10 MG tablet Commonly known as: ZYRTEC Take 10 mg by mouth daily.   cholecalciferol 1000 units tablet Commonly known as: VITAMIN D Take 1,000 Units by mouth daily.   cloBAZam 2.5 MG/ML solution Commonly known as: ONFI Take 3 mLs (7.5 mg total) by mouth 2 (two) times daily.   dexamethasone 4 MG tablet Commonly known as: DECADRON Take 1 tablet (4 mg total) by mouth 2 (two) times daily with a meal.   docusate sodium 100 MG capsule Commonly known as: COLACE Take 1 capsule (100 mg total) by mouth 2 (two) times daily as needed for mild constipation.   feeding supplement Liqd Take 237 mLs by mouth 2 (two) times daily between meals.   fluticasone 50 MCG/ACT nasal spray Commonly known as: FLONASE Place 1-2 sprays into both nostrils daily.   levETIRAcetam 1000 MG tablet Commonly known as: KEPPRA Take 1 tablet (1,000 mg total) by mouth 2 (two) times daily. What changed:  medication strength how much to take   losartan 50 MG tablet Commonly known as: COZAAR Take 1 tablet (50 mg total) by mouth daily.   multivitamin with minerals Tabs tablet Take 1 tablet by mouth at bedtime.   pantoprazole 40 MG tablet Commonly known as: PROTONIX Take 1 tablet (40 mg total) by mouth daily.   sertraline 100 MG tablet Commonly known as: ZOLOFT Take 1 tablet (100 mg total) by mouth daily. What changed: when to take this   valACYclovir 500 MG tablet Commonly known as: VALTREX Take 1 tablet (500 mg total) by mouth 2 (two) times daily as needed (breakout). Takes only if needed for breakouts What changed:  when to take this reasons to take this        Contact information for after-discharge care     Destination     [facility] Preferred SNF .   Service: Skilled Nursing Contact information: [address] [place] [phone]                      Time coordinating discharge: 45  min  Signed:  Geradine Girt DO  [facility] 01/29/2022, 8:59 AM

## 2022-02-03 ENCOUNTER — Telehealth: Payer: Self-pay | Admitting: *Deleted

## 2022-02-03 NOTE — Telephone Encounter (Signed)
Attempted to reach patient to schedule hospital follow up.  Left message for brother to call back and schedule.  We will also need to coordinate with SNF for transportation.

## 2022-02-03 NOTE — Telephone Encounter (Signed)
Left message for [facility] coordinator [facility]) to give a call to set up hospital follow up.  [phone].  Pending call back

## 2022-02-09 ENCOUNTER — Other Ambulatory Visit: Payer: Self-pay | Admitting: Internal Medicine

## 2022-02-10 ENCOUNTER — Telehealth: Payer: Self-pay | Admitting: *Deleted

## 2022-02-10 ENCOUNTER — Inpatient Hospital Stay: Payer: BC Managed Care – PPO | Admitting: Internal Medicine

## 2022-02-10 NOTE — Telephone Encounter (Signed)
Called patients brother to check on no show.  Patient tested positive for Covid on 02/05/2022  and facility was supposed to have called and canceled.  Advised to keep MRI as planned and follow up after.

## 2022-02-24 ENCOUNTER — Ambulatory Visit (HOSPITAL_COMMUNITY): Admission: RE | Admit: 2022-02-24 | Payer: BC Managed Care – PPO | Source: Ambulatory Visit

## 2022-02-24 ENCOUNTER — Telehealth: Payer: Self-pay | Admitting: *Deleted

## 2022-02-24 NOTE — Telephone Encounter (Signed)
Lindsey Daniels's brother drove one hour today to deliver Lindsey Daniels disability form 703 due 02/25/2022.  Completed form at this time to collaborative pick up bin for provider review, signature and return.  Labeled URGENT.   Provided e-mail, fax number or attach PDF to MyChart to avoid drive and improve delivery of form.Marland Kitchen

## 2022-02-27 NOTE — Telephone Encounter (Signed)
Today this nurse received [place] [employer] for short term disability signed by provider.  E-mailed to HR BENEFITS ANALYST as listed on form.  Copy to bin for items to be scanned.  Original to registration area for patient pick up 03/02/2022 completes process.Marland Kitchen

## 2022-02-27 NOTE — Progress Notes (Signed)
Opened incorrect encounter

## 2022-03-01 ENCOUNTER — Ambulatory Visit (HOSPITAL_COMMUNITY)
Admission: RE | Admit: 2022-03-01 | Discharge: 2022-03-01 | Disposition: A | Discharge status: Home / Self Care | Payer: BC Managed Care – PPO | Source: Ambulatory Visit | Attending: Internal Medicine | Admitting: Internal Medicine

## 2022-03-01 DIAGNOSIS — C719 Malignant neoplasm of brain, unspecified: Secondary | ICD-10-CM | POA: Insufficient documentation

## 2022-03-01 MED ORDER — GADOBUTROL 1 MMOL/ML IV SOLN
6.5000 mL | Freq: Once | INTRAVENOUS | Status: AC | PRN
  Administered 2022-03-01: 6.5 mL via INTRAVENOUS

## 2022-03-02 ENCOUNTER — Inpatient Hospital Stay: Payer: BC Managed Care – PPO

## 2022-03-02 ENCOUNTER — Other Ambulatory Visit: Payer: Self-pay

## 2022-03-02 ENCOUNTER — Inpatient Hospital Stay: Payer: BC Managed Care – PPO | Attending: Radiation Oncology | Admitting: Internal Medicine

## 2022-03-02 VITALS — BP 80/57 | HR 67 | Temp 97.0°F | Resp 16

## 2022-03-02 DIAGNOSIS — Z79899 Other long term (current) drug therapy: Secondary | ICD-10-CM | POA: Insufficient documentation

## 2022-03-02 DIAGNOSIS — G40909 Epilepsy, unspecified, not intractable, without status epilepticus: Secondary | ICD-10-CM

## 2022-03-02 DIAGNOSIS — C712 Malignant neoplasm of temporal lobe: Secondary | ICD-10-CM | POA: Diagnosis present

## 2022-03-02 DIAGNOSIS — C719 Malignant neoplasm of brain, unspecified: Secondary | ICD-10-CM

## 2022-03-02 NOTE — Progress Notes (Signed)
[facility] [place] Lindsey Daniels, Lindsey Daniels [phone]   Interval Evaluation  Date of Service: 03/02/22 Patient Name: Lindsey Daniels Patient MRN: [id number] Patient DOB: [birth year] Provider: Ventura Sellers, MD  Identifying Statement:  Lindsey Daniels is a 67 y.o. female with right temporal  pleomorphic xanthoastrocytoma    Oncologic History: Oncology History  Pleomorphic xanthoastrocytoma (Lindsey Daniels)  09/29/2013 Surgery   Right temporal craniotomy, resection with Dr. Sherwood Daniels.  Path is PXA WHO II.   11/29/2020 Surgery   Disease progression, second craniotomy with Dr. Christella Daniels, sub-total resection.  Path is unchanged.   02/21/2021 - 04/02/2021 Radiation Therapy   IMRT radiation therapy with Dr. Lisbeth Daniels   12/16/2021 Treatment Plan Change   Completes 4 cycles of avastin '10mg'$ /kg for severe inflammation, poor tolerance of steroids     Biomarkers:  MGMT Methylated.  IDH 1/2 Wild type.  BRAF V600e mutation detected  TERT Unmutated   Interval History: Lindsey Daniels presents for evaluation following recent hospitalization for seizures, MRI brain.  She is continuing to recover from ICU stay, currently residing in rehab center.  Doing some walking on her own, but still requiring significant help with ADLs due to fatigue, weakness, deconditioning.  Her left arm in particular remains weak compared to prior to hospitalization.  Continues on Keppra '1000mg'$  BID and Onfi 7.'5mg'$  BID.  Decadron is at '4mg'$  twice per day as well.  No other recurrence of focal symptoms or seizures.  H+P (01/30/21) Patient presents today after recent re-resection for progressive right temporal PXA.  She initially presented with a seizure in 2015; imaging demonstrated enhancing right temporal lesion which was grossly resected by Dr. Sherwood Daniels.  After last follow up in 2020, she began to experience headaches, ear fullness and some confusion this fall.  Imaging demonstrated  marked progression of disease; she went for second surgery on 11/29/20.  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   Medications: Current Outpatient Medications on File Prior to Visit  Medication Sig Dispense Refill   amLODipine (NORVASC) 10 MG tablet Take 1 tablet (10 mg total) by mouth daily.     apixaban (ELIQUIS) 5 MG TABS tablet Take 1 tablet (5 mg total) by mouth 2 (two) times daily. 60 tablet 2   atorvastatin (LIPITOR) 20 MG tablet Take 1 tablet (20 mg total) by mouth daily. 90 tablet 3   cetirizine (ZYRTEC) 10 MG tablet Take 10 mg by mouth daily.     cholecalciferol (VITAMIN D) 1000 units tablet Take 1,000 Units by mouth daily.     cloBAZam (ONFI) 2.5 MG/ML solution Take 3 mLs (7.5 mg total) by mouth 2 (two) times daily. 18 mL 0   dexamethasone (DECADRON) 4 MG tablet Take 1 tablet (4 mg total) by mouth 2 (two) times daily with a meal. 60 tablet 0   docusate sodium (COLACE) 100 MG capsule Take 1 capsule (100 mg total) by mouth 2 (two) times daily as needed for mild constipation. 10 capsule 0   feeding supplement (ENSURE ENLIVE / ENSURE PLUS) LIQD Take 237 mLs by mouth 2 (two) times daily between meals. 237 mL 12   fluticasone (FLONASE) 50 MCG/ACT nasal spray Place 1-2 sprays into both nostrils daily. 16 g 0   levETIRAcetam (KEPPRA) 1000 MG tablet Take 1 tablet (1,000 mg total) by mouth 2 (two) times daily.     losartan (COZAAR) 50  MG tablet Take 1 tablet (50 mg total) by mouth daily.     Multiple Vitamin (MULTIVITAMIN WITH MINERALS) TABS tablet Take 1 tablet by mouth at bedtime.     pantoprazole (PROTONIX) 40 MG tablet Take 1 tablet (40 mg total) by mouth daily.     sertraline (ZOLOFT) 100 MG tablet Take 1 tablet (100 mg total) by mouth daily. (Patient taking differently: Take 100 mg by mouth at bedtime.) 90 tablet 3   valACYclovir (VALTREX) 500 MG tablet Take 1 tablet (500 mg total) by mouth 2 (two) times daily  as needed (breakout). Takes only if needed for breakouts 30 tablet    No current facility-administered medications on file prior to visit.    Allergies:  Allergies  Allergen Reactions   Rifapentine Other (See Comments)    Flu-like symptoms   Amoxicillin Rash   Clavulanic Acid Rash   Past Medical History:  Past Medical History:  Diagnosis Date   Allergy    Anal fissure    Anxiety    Cataract    Depression    Elevated LFTs    Hyperglycemia    Hyperlipidemia    Hypertension    Osteoarthritis    Pars defect of lumbar spine    Seizures (HCC)    Spondylolisthesis    lumbar   Past Surgical History:  Past Surgical History:  Procedure Laterality Date   cataract     CRANIOTOMY N/A 10/02/2013   Procedure: 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;  Surgeon: Hosie Spangle, MD;  Location: [facility];  Service: Neurosurgery;  Laterality: N/A;  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   CRANIOTOMY Right 11/29/2020   Procedure: Right Parietal craniotomy for tumor resection;  Surgeon: Ashok Pall, MD;  Location: [facility];  Service: Neurosurgery;  Laterality: Right;   HEEL SPUR SURGERY     Social History:  Social History   Socioeconomic History   Marital status: Divorced    Spouse name: separated-no papers file   Number of children: 0   Years of education: Not on file   Highest education level: Not on file  Occupational History   Occupation: RT    Employer: [employer] @ [place]: [employer] and [employer]  Tobacco Use   Smoking status: Never   Smokeless tobacco: Never  Substance and Sexual Activity   Alcohol use: Yes    Alcohol/week: 0.0 - 4.0 standard drinks of alcohol    Comment: social   Drug use: No   Sexual activity: Not Currently    Partners: Male  Other Topics Concern   Not on file  Social History Narrative   Separated from second husband.  Very contentious split.   Social Determinants of Health    Financial Resource Strain: Low Risk  (01/15/2022)   Overall Financial Resource Strain (CARDIA)    Difficulty of Paying Living Expenses: Not hard at all  Food Insecurity: No Food Insecurity (01/15/2022)   Hunger Vital Sign    Worried About Running Out of Food in the Last Year: Never true    Ran Out of Food in the Last Year: Never true  Transportation Needs: No Transportation Needs (01/15/2022)   PRAPARE - Hydrologist (Medical): No    Lack of Transportation (Non-Medical): No  Physical Activity: Not on file  Stress: Not on file  Social Connections: Not on file  Intimate Partner Violence: Not At Risk (02/06/2021)   Humiliation, Afraid,  Rape, and Kick questionnaire    Fear of Current or Ex-Partner: No    Emotionally Abused: No    Physically Abused: No    Sexually Abused: No   Family History:  Family History  Problem Relation Age of Onset   Mental illness Father        depression   COPD Father    Alcohol abuse Brother    Breast cancer Neg Hx     Review of Systems: Constitutional: Doesn't report fevers, chills or abnormal weight loss Eyes: Doesn't report blurriness of vision Ears, nose, mouth, throat, and face: Doesn't report sore throat Respiratory: +dyspnea Cardiovascular: Doesn't report palpitation, chest discomfort  Gastrointestinal:  Doesn't report nausea, constipation, diarrhea GU: Doesn't report incontinence Skin: Doesn't report skin rashes Neurological: Per HPI Musculoskeletal: Doesn't report joint pain Behavioral/Psych: Doesn't report anxiety  Physical Exam: Vitals:   03/02/22 1127  BP: (!) 80/57  Pulse: 67  Resp: 16  Temp: (!) 97 F (36.1 C)  SpO2: 93%    KPS: 90. General: cushingoid Head: Normal EENT: No conjunctival injection or scleral icterus.  Lungs: Normal Cardiac: Regular rate Abdomen: Non-distended abdomen Skin: +purpura, arms and legs Extremities: +edema  Neurologic Exam: Mental Status: Drowsy, alert,  attentive to examiner with conversation. Oriented to self and environment. Language is fluent with intact comprehension.  Age advanced psychomotor slowing. Cranial Nerves: Visual acuity is grossly normal. Visual fields are full. Extra-ocular movements intact. No ptosis. Face is symmetric Motor: Tone and bulk are normal. Power is 4/5 in left arm, 4+/5 in left leg. Reflexes are symmetric, no pathologic reflexes present.  Sensory: Intact to light touch Gait: Deferred   Labs: I have reviewed the data as listed    Component Value Date/Time   NA 137 01/29/2022 0433   NA 142 06/30/2017 1815   K 4.5 01/29/2022 0433   CL 102 01/29/2022 0433   CO2 23 01/29/2022 0433   GLUCOSE 109 (H) 01/29/2022 0433   BUN 23 01/29/2022 0433   BUN 14 06/30/2017 1815   CREATININE 0.50 01/29/2022 0433   CREATININE 0.47 12/30/2021 0919   CREATININE 0.72 06/07/2015 0825   CALCIUM 9.4 01/29/2022 0433   PROT 5.0 (L) 01/20/2022 0444   PROT 7.0 06/30/2017 1815   ALBUMIN 2.8 (L) 01/20/2022 0444   ALBUMIN 4.6 06/30/2017 1815   AST 24 01/20/2022 0444   AST 50 (H) 12/30/2021 0919   ALT 19 01/20/2022 0444   ALT 22 12/30/2021 0919   ALKPHOS 49 01/20/2022 0444   BILITOT 0.6 01/20/2022 0444   BILITOT 1.4 (H) 12/30/2021 0919   GFRNONAA >60 01/29/2022 0433   GFRNONAA >60 12/30/2021 0919   GFRAA  01/18/2022 0405    UNSATISFACTORY SPECIMEN. CLIENT REQUESTS SPECIMEN TO BE ANALYZED.   Lab Results  Component Value Date   WBC 15.5 (H) 01/29/2022   NEUTROABS 6.6 01/17/2022   HGB 14.5 01/29/2022   HCT 43.2 01/29/2022   MCV 88.3 01/29/2022   PLT 287 01/29/2022   Imaging:  [facility] Clinician Interpretation: I have personally reviewed the CNS images as listed.  My interpretation, in the context of the patient's clinical presentation, is stable disease  MR BRAIN W WO CONTRAST  Result Date: 03/01/2022 CLINICAL DATA:  Pleomorphic xanthoastrocytoma. Brain/CNS neoplasm, assess treatment response. EXAM: MRI HEAD WITHOUT AND WITH  CONTRAST TECHNIQUE: Multiplanar, multiecho pulse sequences of the brain and surrounding structures were obtained without and with intravenous contrast. CONTRAST:  6.86m GADAVIST GADOBUTROL 1 MMOL/ML IV SOLN COMPARISON:  Prior brain MRI examinations  01/17/22 and earlier. FINDINGS: Brain: Mild generalized parenchymal atrophy. Redemonstarated resection cavity within the right temporal lobe. Foci of non-acute hemorrhage, restricted diffusion and enhancement about the resection site have not significantly chnaged. T2 FLAIR hyperintense signal abnormality surrounding the resection site and elsewhere within the right temporal lobe, right frontal lobe, right parietal lobe, right occipital lobe and within the thalamus and deep white matter capsules on the right. This signal abnormality has progressed within portions of the right frontal and parietal lobes as compared to 01/17/22 (for instance as seen on SE 9, IM 31-29). However, no definite progressive mass effect is appreciated. The right perirolandic diffusion-weighted signal abnormality present on the prior MRI has resolved, and this likely reflected seizure-related signal changes on the prior exam. Mild multifocal T2 FLAIR hyperintense signal abnormality elsewhere within the bilateral cerebral white matter and within the pons, non-specific but compatible with chronic small vessel ischemic disease. No extra-axial fluid collection. No midline shift. Vascular: Maintained flow voids within the proximal large arterial vessels. Skull and upper cervical spine: Prior right temporal craniotomy. No focal suspicious marrow lesion. Incompletely assessed cervical spondylosis. Sinuses/Orbits: No orbital mass or acute orbital finding. Fluid opacification of a posterior left ethmoid air cell and of the left sphenoid sinus. Moderate mucosal thickening within the left maxillary sinus. IMPRESSION: 1. T2 FLAIR hyperintense signal abnormality has progressed within portions of the right frontal  and parietal lobes as compared to the prior MRI of 01/17/22. This may reflect tumoral edema or post-radiation changes. No definite progressive mass effect is appreciated. 2. Otherwise stable MRI appearance of the right cerebral tumor. 3. Paranasal sinus disease, as described. Electronically Signed   By: Kellie Simmering D.O.   On: 03/01/2022 17:58     Assessment/Plan Pleomorphic xanthoastrocytoma [facility])  Seizure disorder ([place])  Paola Aleshire continues to make gradual clinic improvements, following prolonged ICU course for status epilepticus.  MRI brain demonstrates stable findings overall, with metabolizing blood products noted.  No increase in DWI signal.  There is some increase in FLAIR, understandable in setting of avastin washout, active seizures.  Decadron should decrease to '4mg'$  daily x7 days, then decrease by '1mg'$  daily each week until discontinued, if tolerated.    Keppra should con't '1000mg'$  BID, Onfi 7.'5mg'$  BID.  If encephalopathy persists, thought secondary to seizure medications, we could discuss alternate AED regimen in the future.  She will continue to work aggressively with PT as discussed.  We ask that Karle Starch Barbara return to clinic in 1 month for continued clinical evaluation, or sooner if needed.  All questions were answered. The patient knows to call the clinic with any problems, questions or concerns. No barriers to learning were detected.  The total time spent in the encounter was 40 minutes and more than 50% was on counseling and review of test results   Lindsey Sellers, MD Medical Director of Neuro-Oncology [facility] at [place] 03/02/22 11:19 AM

## 2022-03-25 ENCOUNTER — Telehealth: Payer: Self-pay | Admitting: *Deleted

## 2022-03-25 NOTE — Telephone Encounter (Signed)
[employer] paperwork completed today by this nurse.  Folder currently placed for collaborative pick up in bin designated for patient assigned provider.  Awaiting return to this nurse upon provider review and signature.

## 2022-03-30 NOTE — Telephone Encounter (Signed)
Today this nurse received [employer] form 7A from collaborative signed by provider.  Returned to [employer] e-mail address as no return fax number provided.  Note to send copy to patient.  E-mailed and mailed to address on file.  [address] [place] [address] Copy to bin for items to be scanned completes process.  No further instructions received or activities performed by this nurse.

## 2022-03-31 ENCOUNTER — Inpatient Hospital Stay: Payer: BC Managed Care – PPO | Admitting: Internal Medicine

## 2022-04-14 ENCOUNTER — Other Ambulatory Visit: Payer: Self-pay

## 2022-04-14 ENCOUNTER — Inpatient Hospital Stay: Payer: BC Managed Care – PPO | Attending: Radiation Oncology | Admitting: Internal Medicine

## 2022-04-14 VITALS — BP 131/70 | HR 78 | Temp 97.8°F | Resp 18 | Ht 63.0 in | Wt 154.1 lb

## 2022-04-14 DIAGNOSIS — Z79899 Other long term (current) drug therapy: Secondary | ICD-10-CM | POA: Insufficient documentation

## 2022-04-14 DIAGNOSIS — G40909 Epilepsy, unspecified, not intractable, without status epilepticus: Secondary | ICD-10-CM

## 2022-04-14 DIAGNOSIS — C712 Malignant neoplasm of temporal lobe: Secondary | ICD-10-CM | POA: Insufficient documentation

## 2022-04-14 DIAGNOSIS — C719 Malignant neoplasm of brain, unspecified: Secondary | ICD-10-CM | POA: Diagnosis not present

## 2022-04-14 MED ORDER — CLOBAZAM 2.5 MG/ML PO SUSP: 5.0000 mg | Freq: Two times a day (BID) | ORAL | 0 refills | Status: DC

## 2022-04-14 MED ORDER — DEXAMETHASONE 4 MG PO TABS: 4.0000 mg | ORAL_TABLET | Freq: Every day | ORAL | 0 refills | Status: DC

## 2022-04-14 NOTE — Progress Notes (Signed)
[facility] [place], [place] [address] [phone]   Interval Evaluation  Date of Service: 04/14/22 Patient Name: Lindsey Daniels Patient MRN: [id number] Patient DOB: April 03, 1955 Provider: Ventura Sellers, MD  Identifying Statement:  Lindsey Daniels is a 67 y.o. female with right temporal  pleomorphic xanthoastrocytoma    Oncologic History: Oncology History  Pleomorphic xanthoastrocytoma ([facility])  09/29/2013 Surgery   Right temporal craniotomy, resection with Dr. Sherwood Gambler.  Path is PXA WHO II.   11/29/2020 Surgery   Disease progression, second craniotomy with Dr. Christella Noa, sub-total resection.  Path is unchanged.   02/21/2021 - 04/02/2021 Radiation Therapy   IMRT radiation therapy with Dr. Lisbeth Renshaw   12/16/2021 Treatment Plan Change   Completes 4 cycles of avastin '10mg'$ /kg for severe inflammation, poor tolerance of steroids     Biomarkers:  MGMT Methylated.  IDH 1/2 Wild type.  BRAF V600e mutation detected  TERT Unmutated   Interval History: Lindsey Daniels presents for evaluation.  She remains in the nursing facility, is continuing to recover from ICU stay.  Overall she remains very fatigued, "foggy" and slow.  Doing some walking on her own, but still requiring significant help with ADLs due to fatigue, weakness, deconditioning.  Left sided weakness has improved in recent weeks.  Continues on Keppra '1000mg'$  BID and Onfi 7.'5mg'$  BID.  Decadron is at '4mg'$  twice per day as well.  No other recurrence of focal symptoms or seizures.  H+P (01/30/21) Patient presents today after recent re-resection for progressive right temporal PXA.  She initially presented with a seizure in 2015; imaging demonstrated enhancing right temporal lesion which was grossly resected by Dr. Sherwood Gambler.  After last follow up in 2020, she began to experience headaches, ear fullness and some confusion this fall.  Imaging demonstrated marked progression of disease; she  went for second surgery on 11/29/20.  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   Medications: Current Outpatient Medications on File Prior to Visit  Medication Sig Dispense Refill   amLODipine (NORVASC) 10 MG tablet Take 1 tablet (10 mg total) by mouth daily.     apixaban (ELIQUIS) 5 MG TABS tablet Take 1 tablet (5 mg total) by mouth 2 (two) times daily. 60 tablet 2   atorvastatin (LIPITOR) 20 MG tablet Take 1 tablet (20 mg total) by mouth daily. 90 tablet 3   cetirizine (ZYRTEC) 10 MG tablet Take 10 mg by mouth daily.     cholecalciferol (VITAMIN D) 1000 units tablet Take 1,000 Units by mouth daily.     cloBAZam (ONFI) 2.5 MG/ML solution Take 3 mLs (7.5 mg total) by mouth 2 (two) times daily. 18 mL 0   dexamethasone (DECADRON) 4 MG tablet Take 1 tablet (4 mg total) by mouth 2 (two) times daily with a meal. 60 tablet 0   fluticasone (FLONASE) 50 MCG/ACT nasal spray Place 1-2 sprays into both nostrils daily. 16 g 0   levETIRAcetam (KEPPRA) 1000 MG tablet Take 1 tablet (1,000 mg total) by mouth 2 (two) times daily.     losartan (COZAAR) 50 MG tablet Take 1 tablet (50 mg total) by mouth daily.     melatonin 5 MG TABS Take 5 mg by mouth at bedtime.     Multiple Vitamin (MULTIVITAMIN WITH MINERALS) TABS tablet Take 1 tablet by mouth at bedtime.     omeprazole (PRILOSEC) 20 MG capsule Take  20 mg by mouth daily.     sertraline (ZOLOFT) 100 MG tablet Take 1 tablet (100 mg total) by mouth daily. (Patient taking differently: Take 100 mg by mouth at bedtime.) 90 tablet 3   traZODone (DESYREL) 50 MG tablet Take 50 mg by mouth at bedtime.     docusate sodium (COLACE) 100 MG capsule Take 1 capsule (100 mg total) by mouth 2 (two) times daily as needed for mild constipation. (Patient not taking: Reported on 04/14/2022) 10 capsule 0   feeding supplement (ENSURE ENLIVE / ENSURE PLUS) LIQD Take 237 mLs by mouth 2 (two) times daily  between meals. (Patient not taking: Reported on 04/14/2022) 237 mL 12   pantoprazole (PROTONIX) 40 MG tablet Take 1 tablet (40 mg total) by mouth daily. (Patient not taking: Reported on 03/02/2022)     valACYclovir (VALTREX) 500 MG tablet Take 1 tablet (500 mg total) by mouth 2 (two) times daily as needed (breakout). Takes only if needed for breakouts (Patient not taking: Reported on 04/14/2022) 30 tablet    No current facility-administered medications on file prior to visit.    Allergies:  Allergies  Allergen Reactions   Rifapentine Other (See Comments)    Flu-like symptoms   Amoxicillin Rash   Clavulanic Acid Rash   Past Medical History:  Past Medical History:  Diagnosis Date   Allergy    Anal fissure    Anxiety    Cataract    Depression    Elevated LFTs    Hyperglycemia    Hyperlipidemia    Hypertension    Osteoarthritis    Pars defect of lumbar spine    Seizures (HCC)    Spondylolisthesis    lumbar   Past Surgical History:  Past Surgical History:  Procedure Laterality Date   cataract     CRANIOTOMY N/A 10/02/2013   Procedure: 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;  Surgeon: Hosie Spangle, MD;  Location: [facility];  Service: Neurosurgery;  Laterality: N/A;  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   CRANIOTOMY Right 11/29/2020   Procedure: Right Parietal craniotomy for tumor resection;  Surgeon: Ashok Pall, MD;  Location: [facility];  Service: Neurosurgery;  Laterality: Right;   HEEL SPUR SURGERY     Social History:  Social History   Socioeconomic History   Marital status: Divorced    Spouse name: separated-no papers file   Number of children: 0   Years of education: Not on file   Highest education level: Not on file  Occupational History   Occupation: RT    Employer: [employer]: [employer] and [employer]  Tobacco Use   Smoking status: Never   Smokeless tobacco: Never  Substance and  Sexual Activity   Alcohol use: Yes    Alcohol/week: 0.0 - 4.0 standard drinks of alcohol    Comment: social   Drug use: No   Sexual activity: Not Currently    Partners: Male  Other Topics Concern   Not on file  Social History Narrative   Separated from second husband.  Very contentious split.   Social Determinants of Health   Financial Resource Strain: Low Risk  (01/15/2022)   Overall Financial Resource Strain (CARDIA)    Difficulty of Paying Living Expenses: Not hard at all  Food Insecurity: No Food Insecurity (01/15/2022)   Hunger Vital Sign    Worried About Running Out of Food in the Last Year: Never true  Ran Out of Food in the Last Year: Never true  Transportation Needs: No Transportation Needs (01/15/2022)   PRAPARE - Hydrologist (Medical): No    Lack of Transportation (Non-Medical): No  Physical Activity: Not on file  Stress: Not on file  Social Connections: Not on file  Intimate Partner Violence: Not At Risk (02/06/2021)   Humiliation, Afraid, Rape, and Kick questionnaire    Fear of Current or Ex-Partner: No    Emotionally Abused: No    Physically Abused: No    Sexually Abused: No   Family History:  Family History  Problem Relation Age of Onset   Mental illness Father        depression   COPD Father    Alcohol abuse Brother    Breast cancer Neg Hx     Review of Systems: Constitutional: Doesn't report fevers, chills or abnormal weight loss Eyes: Doesn't report blurriness of vision Ears, nose, mouth, throat, and face: Doesn't report sore throat Respiratory: +dyspnea Cardiovascular: Doesn't report palpitation, chest discomfort  Gastrointestinal:  Doesn't report nausea, constipation, diarrhea GU: Doesn't report incontinence Skin: Doesn't report skin rashes Neurological: Per HPI Musculoskeletal: Doesn't report joint pain Behavioral/Psych: Doesn't report anxiety  Physical Exam: Vitals:   04/14/22 1418  BP: 131/70  Pulse:  78  Resp: 18  Temp: 97.8 F (36.6 C)  SpO2: 96%    KPS: 90. General: cushingoid Head: Normal EENT: No conjunctival injection or scleral icterus.  Lungs: Normal Cardiac: Regular rate Abdomen: Non-distended abdomen Skin: +purpura, arms and legs Extremities: +edema  Neurologic Exam: Mental Status: Drowsy, alert, attentive to examiner with conversation. Oriented to self and environment. Language is fluent with intact comprehension.  Age advanced psychomotor slowing. Cranial Nerves: Visual acuity is grossly normal. Visual fields are full. Extra-ocular movements intact. No ptosis. Face is symmetric Motor: Tone and bulk are normal. Power is 4+/5 in left arm, 5/5 in left leg. Reflexes are symmetric, no pathologic reflexes present.  Sensory: Intact to light touch Gait: Deferred   Labs: I have reviewed the data as listed    Component Value Date/Time   NA 137 01/29/2022 0433   NA 142 06/30/2017 1815   K 4.5 01/29/2022 0433   CL 102 01/29/2022 0433   CO2 23 01/29/2022 0433   GLUCOSE 109 (H) 01/29/2022 0433   BUN 23 01/29/2022 0433   BUN 14 06/30/2017 1815   CREATININE 0.50 01/29/2022 0433   CREATININE 0.47 12/30/2021 0919   CREATININE 0.72 06/07/2015 0825   CALCIUM 9.4 01/29/2022 0433   PROT 5.0 (L) 01/20/2022 0444   PROT 7.0 06/30/2017 1815   ALBUMIN 2.8 (L) 01/20/2022 0444   ALBUMIN 4.6 06/30/2017 1815   AST 24 01/20/2022 0444   AST 50 (H) 12/30/2021 0919   ALT 19 01/20/2022 0444   ALT 22 12/30/2021 0919   ALKPHOS 49 01/20/2022 0444   BILITOT 0.6 01/20/2022 0444   BILITOT 1.4 (H) 12/30/2021 0919   GFRNONAA >60 01/29/2022 0433   GFRNONAA >60 12/30/2021 0919   GFRAA  01/18/2022 0405    UNSATISFACTORY SPECIMEN. CLIENT REQUESTS SPECIMEN TO BE ANALYZED.   Lab Results  Component Value Date   WBC 15.5 (H) 01/29/2022   NEUTROABS 6.6 01/17/2022   HGB 14.5 01/29/2022   HCT 43.2 01/29/2022   MCV 88.3 01/29/2022   PLT 287 01/29/2022     Assessment/Plan Pleomorphic  xanthoastrocytoma (HCC)  Seizure disorder ([place])  Lindsey Daniels continues with clinical improvement, in particular with motor function.  Encephalopathy remains greater than expected for recovery from status epilepticus.  There is likely iatrogenic contribution at this point, most likely from clobazam.  We recommended very slow tapering of Onfi, starting with '5mg'$  BID x10 days.  Can decrease to 2.'5mg'$  BID x10 days after that if no seizures.  Decadron should decrease to '4mg'$  daily x10 days, then decrease to '4mg'$  daily thereafter.    Keppra should con't '1000mg'$  BID.  She will continue to work aggressively with PT as discussed.  We ask that Karle Starch Propst return to clinic in 1 month with MRI brain and continued AED titration, or sooner if needed.  All questions were answered. The patient knows to call the clinic with any problems, questions or concerns. No barriers to learning were detected.  The total time spent in the encounter was 40 minutes and more than 50% was on counseling and review of test results   Ventura Sellers, MD Medical Director of Neuro-Oncology [facility] [place] 04/14/22 2:39 PM

## 2022-04-16 ENCOUNTER — Other Ambulatory Visit: Payer: Self-pay | Admitting: Radiation Therapy

## 2022-05-07 ENCOUNTER — Ambulatory Visit (HOSPITAL_COMMUNITY)
Admission: RE | Admit: 2022-05-07 | Discharge: 2022-05-07 | Disposition: A | Discharge status: Home / Self Care | Payer: BC Managed Care – PPO | Source: Ambulatory Visit

## 2022-05-07 ENCOUNTER — Ambulatory Visit (HOSPITAL_COMMUNITY)
Admission: RE | Admit: 2022-05-07 | Discharge: 2022-05-07 | Disposition: A | Discharge status: Home / Self Care | Payer: BC Managed Care – PPO | Source: Ambulatory Visit | Attending: Internal Medicine | Admitting: Internal Medicine

## 2022-05-07 ENCOUNTER — Other Ambulatory Visit: Payer: Self-pay | Admitting: Internal Medicine

## 2022-05-07 DIAGNOSIS — C719 Malignant neoplasm of brain, unspecified: Secondary | ICD-10-CM

## 2022-05-07 MED ORDER — GADOBUTROL 1 MMOL/ML IV SOLN
7.0000 mL | Freq: Once | INTRAVENOUS | Status: AC | PRN
  Administered 2022-05-07: 7 mL via INTRAVENOUS

## 2022-05-11 ENCOUNTER — Inpatient Hospital Stay: Payer: BC Managed Care – PPO

## 2022-05-12 ENCOUNTER — Other Ambulatory Visit: Payer: Self-pay | Admitting: Radiation Therapy

## 2022-05-12 ENCOUNTER — Other Ambulatory Visit: Payer: Self-pay

## 2022-05-12 ENCOUNTER — Inpatient Hospital Stay: Payer: BC Managed Care – PPO | Attending: Radiation Oncology | Admitting: Internal Medicine

## 2022-05-12 ENCOUNTER — Inpatient Hospital Stay: Payer: BC Managed Care – PPO

## 2022-05-12 VITALS — BP 129/75 | HR 113 | Temp 97.3°F | Resp 16

## 2022-05-12 DIAGNOSIS — C719 Malignant neoplasm of brain, unspecified: Secondary | ICD-10-CM

## 2022-05-12 DIAGNOSIS — G40909 Epilepsy, unspecified, not intractable, without status epilepticus: Secondary | ICD-10-CM | POA: Insufficient documentation

## 2022-05-12 DIAGNOSIS — C712 Malignant neoplasm of temporal lobe: Secondary | ICD-10-CM | POA: Diagnosis not present

## 2022-05-12 DIAGNOSIS — Z79899 Other long term (current) drug therapy: Secondary | ICD-10-CM | POA: Diagnosis not present

## 2022-05-12 DIAGNOSIS — G9340 Encephalopathy, unspecified: Secondary | ICD-10-CM

## 2022-05-12 LAB — CMP (CANCER CENTER ONLY)
ALT: 32 U/L (ref 0–44)
AST: 25 U/L (ref 15–41)
Albumin: 4.1 g/dL (ref 3.5–5.0)
Alkaline Phosphatase: 111 U/L (ref 38–126)
Anion gap: 11 (ref 5–15)
BUN: 18 mg/dL (ref 8–23)
CO2: 23 mmol/L (ref 22–32)
Calcium: 9.9 mg/dL (ref 8.9–10.3)
Chloride: 107 mmol/L (ref 98–111)
Creatinine: 0.62 mg/dL (ref 0.44–1.00)
GFR, Estimated: >60 mL/min (ref >60)
Glucose, Bld: 169 mg/dL — ABNORMAL HIGH (ref 70–99)
Potassium: 4.2 mmol/L (ref 3.5–5.1)
Sodium: 141 mmol/L (ref 135–145)
Total Bilirubin: 0.7 mg/dL (ref 0.3–1.2)
Total Protein: 7.2 g/dL (ref 6.5–8.1)

## 2022-05-12 LAB — CBC WITH DIFFERENTIAL (CANCER CENTER ONLY)
Abs Immature Granulocytes: 0.07 10*3/uL (ref 0.00–0.07)
Basophils Absolute: 0 10*3/uL (ref 0.0–0.1)
Basophils Relative: 0 %
Eosinophils Absolute: 0 10*3/uL (ref 0.0–0.5)
Eosinophils Relative: 0 %
HCT: 35.9 % — ABNORMAL LOW (ref 36.0–46.0)
Hemoglobin: 11.7 g/dL — ABNORMAL LOW (ref 12.0–15.0)
Immature Granulocytes: 1 %
Lymphocytes Relative: 16 %
Lymphs Abs: 1.3 10*3/uL (ref 0.7–4.0)
MCH: 31.8 pg (ref 26.0–34.0)
MCHC: 32.6 g/dL (ref 30.0–36.0)
MCV: 97.6 fL (ref 80.0–100.0)
Monocytes Absolute: 0.4 10*3/uL (ref 0.1–1.0)
Monocytes Relative: 5 %
Neutro Abs: 6 10*3/uL (ref 1.7–7.7)
Neutrophils Relative %: 78 %
Platelet Count: 495 10*3/uL — ABNORMAL HIGH (ref 150–400)
RBC: 3.68 MIL/uL — ABNORMAL LOW (ref 3.87–5.11)
RDW: 17.4 % — ABNORMAL HIGH (ref 11.5–15.5)
WBC Count: 7.7 10*3/uL (ref 4.0–10.5)
nRBC: 0 % (ref 0.0–0.2)

## 2022-05-12 LAB — TOTAL PROTEIN, URINE DIPSTICK: Protein, ur: NEGATIVE mg/dL

## 2022-05-12 LAB — CORTISOL: Cortisol, Plasma: 0.7 ug/dL

## 2022-05-12 LAB — C-REACTIVE PROTEIN: CRP: 2.9 mg/dL — ABNORMAL HIGH (ref <1.0)

## 2022-05-12 LAB — VITAMIN B12: Vitamin B-12: 877 pg/mL (ref 180–914)

## 2022-05-12 LAB — VITAMIN D 25 HYDROXY (VIT D DEFICIENCY, FRACTURES): Vit D, 25-Hydroxy: 53.63 ng/mL (ref 30–100)

## 2022-05-12 LAB — SEDIMENTATION RATE: Sed Rate: 89 mm/hr — ABNORMAL HIGH (ref 0–22)

## 2022-05-12 NOTE — Progress Notes (Signed)
[facility] [place], [place] [address] [phone]   Interval Evaluation  Date of Service: 05/12/22 Patient Name: Lindsey Daniels Patient MRN: [id number] Patient DOB: Dec 28, 1955 Provider: Ventura Sellers, MD  Identifying Statement:  Lindsey Daniels is a 67 y.o. female with right temporal  pleomorphic xanthoastrocytoma    Oncologic History: Oncology History  Pleomorphic xanthoastrocytoma ([place])  09/29/2013 Surgery   Right temporal craniotomy, resection with Dr. Sherwood Gambler.  Path is PXA WHO II.   11/29/2020 Surgery   Disease progression, second craniotomy with Dr. Christella Noa, sub-total resection.  Path is unchanged.   02/21/2021 - 04/02/2021 Radiation Therapy   IMRT radiation therapy with Dr. Lisbeth Renshaw   12/16/2021 Treatment Plan Change   Completes 4 cycles of avastin 10mg /kg for severe inflammation, poor tolerance of steroids     Biomarkers:  MGMT Methylated.  IDH 1/2 Wild type.  BRAF V600e mutation detected  TERT Unmutated   Interval History: Lindsey Daniels presents today with her brother, following MRI brain.  She remains in the nursing facility, unfortunately not significantly improved from last month.  Overall she remains very fatigued, "foggy" and slow.  Again, doing some walking on her own, but still requiring significant help with ADLs due to fatigue, weakness, deconditioning.  Continues on Keppra 1000mg  BID, now reduced Onfi to 2.5mg  BID.  Decadron is at 2mg  daily currently.  Did have a fall recently with abrasion to her face.  No other recurrence of focal symptoms or seizures.  H+P (01/30/21) Patient presents today after recent re-resection for progressive right temporal PXA.  She initially presented with a seizure in 2015; imaging demonstrated enhancing right temporal lesion which was grossly resected by Dr. Sherwood Gambler.  After last follow up in 2020, she began to experience headaches, ear fullness and some confusion this  fall.  Imaging demonstrated marked progression of disease; she went for second surgery on 11/29/20.  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   Medications: Current Outpatient Medications on File Prior to Visit  Medication Sig Dispense Refill   amLODipine (NORVASC) 10 MG tablet Take 1 tablet (10 mg total) by mouth daily.     apixaban (ELIQUIS) 5 MG TABS tablet Take 1 tablet (5 mg total) by mouth 2 (two) times daily. 60 tablet 2   atorvastatin (LIPITOR) 20 MG tablet Take 1 tablet (20 mg total) by mouth daily. 90 tablet 3   cetirizine (ZYRTEC) 10 MG tablet Take 10 mg by mouth daily.     cholecalciferol (VITAMIN D) 1000 units tablet Take 1,000 Units by mouth daily.     cloBAZam (ONFI) 2.5 MG/ML solution Take 2 mLs (5 mg total) by mouth 2 (two) times daily. 18 mL 0   dexamethasone (DECADRON) 4 MG tablet Take 1 tablet (4 mg total) by mouth daily. 60 tablet 0   docusate sodium (COLACE) 100 MG capsule Take 1 capsule (100 mg total) by mouth 2 (two) times daily as needed for mild constipation. (Patient not taking: Reported on 04/14/2022) 10 capsule 0   feeding supplement (ENSURE ENLIVE / ENSURE PLUS) LIQD Take 237 mLs by mouth 2 (two) times daily between meals. (Patient not taking: Reported on 04/14/2022) 237 mL 12   fluticasone (FLONASE) 50 MCG/ACT nasal spray Place 1-2 sprays into both nostrils daily. 16 g 0   levETIRAcetam (KEPPRA) 1000 MG tablet Take 1 tablet (1,000 mg total) by mouth  2 (two) times daily.     losartan (COZAAR) 50 MG tablet Take 1 tablet (50 mg total) by mouth daily.     melatonin 5 MG TABS Take 5 mg by mouth at bedtime.     Multiple Vitamin (MULTIVITAMIN WITH MINERALS) TABS tablet Take 1 tablet by mouth at bedtime.     omeprazole (PRILOSEC) 20 MG capsule Take 20 mg by mouth daily.     pantoprazole (PROTONIX) 40 MG tablet Take 1 tablet (40 mg total) by mouth daily. (Patient not taking: Reported on 03/02/2022)      sertraline (ZOLOFT) 100 MG tablet Take 1 tablet (100 mg total) by mouth daily. (Patient taking differently: Take 100 mg by mouth at bedtime.) 90 tablet 3   traZODone (DESYREL) 50 MG tablet Take 50 mg by mouth at bedtime.     valACYclovir (VALTREX) 500 MG tablet Take 1 tablet (500 mg total) by mouth 2 (two) times daily as needed (breakout). Takes only if needed for breakouts (Patient not taking: Reported on 04/14/2022) 30 tablet    No current facility-administered medications on file prior to visit.    Allergies:  Allergies  Allergen Reactions   Rifapentine Other (See Comments)    Flu-like symptoms   Amoxicillin Rash   Clavulanic Acid Rash   Past Medical History:  Past Medical History:  Diagnosis Date   Allergy    Anal fissure    Anxiety    Cataract    Depression    Elevated LFTs    Hyperglycemia    Hyperlipidemia    Hypertension    Osteoarthritis    Pars defect of lumbar spine    Seizures (HCC)    Spondylolisthesis    lumbar   Past Surgical History:  Past Surgical History:  Procedure Laterality Date   cataract     CRANIOTOMY N/A 10/02/2013   Procedure: 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;  Surgeon: Hosie Spangle, MD;  Location: [facility];  Service: Neurosurgery;  Laterality: N/A;  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   CRANIOTOMY Right 11/29/2020   Procedure: Right Parietal craniotomy for tumor resection;  Surgeon: Ashok Pall, MD;  Location: [facility];  Service: Neurosurgery;  Laterality: Right;   HEEL SPUR SURGERY     Social History:  Social History   Socioeconomic History   Marital status: Divorced    Spouse name: separated-no papers file   Number of children: 0   Years of education: Not on file   Highest education level: Not on file  Occupational History   Occupation: RT    Employer: [employer] @ [employer]: [employer] and [employer]  Tobacco Use   Smoking status: Never   Smokeless  tobacco: Never  Substance and Sexual Activity   Alcohol use: Yes    Alcohol/week: 0.0 - 4.0 standard drinks of alcohol    Comment: social   Drug use: No   Sexual activity: Not Currently    Partners: Male  Other Topics Concern   Not on file  Social History Narrative   Separated from second husband.  Very contentious split.   Social Determinants of Health   Financial Resource Strain: Low Risk  (01/15/2022)   Overall Financial Resource Strain (CARDIA)    Difficulty of Paying Living Expenses: Not hard at all  Food Insecurity: No Food Insecurity (01/15/2022)   Hunger Vital Sign    Worried About Running Out of Food in the Last Year: Never true  Ran Out of Food in the Last Year: Never true  Transportation Needs: No Transportation Needs (01/15/2022)   PRAPARE - Hydrologist (Medical): No    Lack of Transportation (Non-Medical): No  Physical Activity: Not on file  Stress: Not on file  Social Connections: Not on file  Intimate Partner Violence: Not At Risk (02/06/2021)   Humiliation, Afraid, Rape, and Kick questionnaire    Fear of Current or Ex-Partner: No    Emotionally Abused: No    Physically Abused: No    Sexually Abused: No   Family History:  Family History  Problem Relation Age of Onset   Mental illness Father        depression   COPD Father    Alcohol abuse Brother    Breast cancer Neg Hx     Review of Systems: Constitutional: Doesn't report fevers, chills or abnormal weight loss Eyes: Doesn't report blurriness of vision Ears, nose, mouth, throat, and face: Doesn't report sore throat Respiratory: +dyspnea Cardiovascular: Doesn't report palpitation, chest discomfort  Gastrointestinal:  Doesn't report nausea, constipation, diarrhea GU: Doesn't report incontinence Skin: Doesn't report skin rashes Neurological: Per HPI Musculoskeletal: Doesn't report joint pain Behavioral/Psych: Doesn't report anxiety  Physical Exam: Vitals:    05/12/22 1403  BP: 129/75  Pulse: (!) 113  Resp: 16  Temp: (!) 97.3 F (36.3 C)  SpO2: 94%   KPS: 70. General: cushingoid Head: Normal EENT: No conjunctival injection or scleral icterus.  Lungs: Normal Cardiac: Regular rate Abdomen: Non-distended abdomen Skin: +purpura, arms and legs Extremities: +edema  Neurologic Exam: Mental Status: Drowsy, alert, attentive to examiner with conversation. Oriented to self and environment. Language is fluent with intact comprehension.  Displays some disorganized thinking. Age advanced psychomotor slowing. Cranial Nerves: Visual acuity is grossly normal. Visual fields are full. Extra-ocular movements intact. No ptosis. Face is symmetric Motor: Tone and bulk are normal. Power is 4+/5 in left arm, 5/5 in left leg. Reflexes are symmetric, no pathologic reflexes present.  Sensory: Intact to light touch Gait: Deferred   Labs: I have reviewed the data as listed    Component Value Date/Time   NA 137 01/29/2022 0433   NA 142 06/30/2017 1815   K 4.5 01/29/2022 0433   CL 102 01/29/2022 0433   CO2 23 01/29/2022 0433   GLUCOSE 109 (H) 01/29/2022 0433   BUN 23 01/29/2022 0433   BUN 14 06/30/2017 1815   CREATININE 0.50 01/29/2022 0433   CREATININE 0.47 12/30/2021 0919   CREATININE 0.72 06/07/2015 0825   CALCIUM 9.4 01/29/2022 0433   PROT 5.0 (L) 01/20/2022 0444   PROT 7.0 06/30/2017 1815   ALBUMIN 2.8 (L) 01/20/2022 0444   ALBUMIN 4.6 06/30/2017 1815   AST 24 01/20/2022 0444   AST 50 (H) 12/30/2021 0919   ALT 19 01/20/2022 0444   ALT 22 12/30/2021 0919   ALKPHOS 49 01/20/2022 0444   BILITOT 0.6 01/20/2022 0444   BILITOT 1.4 (H) 12/30/2021 0919   GFRNONAA >60 01/29/2022 0433   GFRNONAA >60 12/30/2021 0919   GFRAA  01/18/2022 0405    UNSATISFACTORY SPECIMEN. CLIENT REQUESTS SPECIMEN TO BE ANALYZED.   Lab Results  Component Value Date   WBC 15.5 (H) 01/29/2022   NEUTROABS 6.6 01/17/2022   HGB 14.5 01/29/2022   HCT 43.2 01/29/2022   MCV  88.3 01/29/2022   PLT 287 01/29/2022    Imaging:  [facility] Clinician Interpretation: I have personally reviewed the CNS images as listed.  My interpretation,  in the context of the patient's clinical presentation, is likely treatment effect  MR BRAIN W WO CONTRAST  Result Date: 05/11/2022 CLINICAL DATA:  Pleomorphic xanthoastrocytoma, assess treatment response. EXAM: MRI HEAD WITHOUT AND WITH CONTRAST TECHNIQUE: Multiplanar, multiecho pulse sequences of the brain and surrounding structures were obtained without and with intravenous contrast. CONTRAST:  31mL GADAVIST GADOBUTROL 1 MMOL/ML IV SOLN COMPARISON:  Brain MRI 03/01/2022, subsequently obtained CT head 05/08/2022 FINDINGS: Brain: Postsurgical changes reflecting right sided craniotomy for mass resection are again seen. There is a small amount of postsurgical blood products at the periphery of the resection cavity. The resection cavity measures approximately 3.3 cm x 2.6 cm x 1.9 cm on the current study, grossly similar in size to the prior study. Curvilinear enhancement at the periphery of the resection cavity is overall increased in conspicuity compared to the prior study (for example see image 16-78 and 17-15 on the current study compared to image 16-66 and 17-14 on the prior study). There is confluent diffusion restriction within a masslike area of FLAIR signal abnormality anterior to the resection cavity extending superiorly along the medial aspect of the resection cavity (5-20, 9-20), similar to the prior study. The dominant area of diffusion restriction measures 2.2 cm x 1.6 cm in the axial plane on the current study, not significantly changed. Additional FLAIR signal abnormality in the temporal lobe around the resection cavity extending into the occipital, much of the parietal lobe, and frontal lobe posterior limb of the internal capsule and external capsule with involvement of the is overall similar to the prior study. There is no progressive mass  effect. There is no midline shift. There is no acute intracranial hemorrhage, extra-axial fluid collection, or acute infarct. Vascular: Normal flow voids. Skull and upper cervical spine: Postsurgical changes as above. There is no suspicious parenchymal signal abnormality. Sinuses/Orbits: There is complete opacification of the left maxillary sinus and near-complete opacification of the posterior ethmoid air cells and sphenoid sinuses. Findings are progressed since the prior MRI. Bilateral lens implants are in place. The globes and orbits are otherwise unremarkable. Other: None. IMPRESSION: 1. Increased enhancement at the periphery of the right temporal resection site since 03/01/2022. Progressive tumor is not excluded. 2. Masslike FLAIR signal abnormality and diffusion restriction along the anteromedial aspect of the resection cavity is not significantly changed. Extensive FLAIR signal abnormality throughout the the remainder of the right cerebral hemisphere detailed above is unchanged, with no new or progressive mass effect. 3. Extensive paranasal sinus disease, progressed since the prior MRI. Electronically Signed   By: Valetta Mole M.D.   On: 05/11/2022 15:24   DG Skull 1-3 Views  Result Date: 05/07/2022 CLINICAL DATA:  History of CNS neoplasm EXAM: SKULL - 1-3 VIEW COMPARISON:  CT 01/26/2022, MRI 03/01/2022 FINDINGS: Stable right craniotomy surgical plate and clips. No additional metallic density foreign objects are seen. Dental amalgams. IMPRESSION: Postsurgical changes of the right calvarium. No unexpected metallic density foreign objects are seen. . Electronically Signed   By: Donavan Foil M.D.   On: 05/07/2022 17:29   DG Chest 1 View  Result Date: 05/07/2022 CLINICAL DATA:  MRI clearance EXAM: CHEST  1 VIEW COMPARISON:  Chest x-ray 01/18/2022. FINDINGS: Enlarged cardiopericardial silhouette with tortuous ectatic aorta. Left basilar atelectasis. No pneumothorax or edema. No consolidation. No  radiopaque foreign body. IMPRESSION: No radiopaque foreign body along the thorax. Enlarged cardiopericardial silhouette.  Left basilar atelectasis. Electronically Signed   By: Jill Side M.D.   On: 05/07/2022 17:25  DG Abd 1 View  Result Date: 05/07/2022 CLINICAL DATA:  MRI clearance EXAM: ABDOMEN - 1 VIEW COMPARISON:  X-ray 01/23/2022 FINDINGS: Gas is seen in nondilated loops of small and large bowel with scattered stool. There is significant stool in the rectum. Vascular calcifications in the pelvis. No obstruction or free air. No definite radiopaque foreign body although of note the diaphragm is clipped off the edge of the film as well as the right hemi abdominal edge. IMPRESSION: Nonspecific bowel gas pattern with scattered stool. No definite radiopaque foreign body. Of note the right hemi abdominal edge is clipped off the edge of the film as is the diaphragm Electronically Signed   By: Jill Side M.D.   On: 05/07/2022 17:24     Assessment/Plan Pleomorphic xanthoastrocytoma [facility])  Seizure disorder ([place])  Jeniya Parham is clinically stable today, without hoped-for cognitive improvement.  Encephalopathy remains greater than expected for recovery from status epilepticus.  There remains suspicion for iatrogenic component, most likely from clobazam.  MRI demonstrates mostly stable findings; there is increased enhancement in symmetric ring pattern around resection cavity, pattern may be more consistent with corticosteroid withdrawal than any tumorigenic progression.  We recommended discontinuing Onfi given above concerns.  If breakthrough seizures occur, Keppra can be increased or alternate AED can be utilized.   Decadron should decrease to 1mg  daily x10 days, then STOPPED if tolerated.   Keppra should con't 1000mg  BID.  Per nursing records reviewed today, she has been dosing Trazodone 50mg  at 7am.  We recommended stopping this given cognitive/encephalopathy concerns.  We will check TSH,  B12, Vit D, Cortisol, ESR, CRP, CMP, CBC in serum today to evaluate for metabolic issues more broadly.  She will continue to work aggressively with PT as discussed.  We ask that Kerryanne Coletti return to clinic in 1 month, or sooner if needed.  All questions were answered. The patient knows to call the clinic with any problems, questions or concerns. No barriers to learning were detected.  The total time spent in the encounter was 40 minutes and more than 50% was on counseling and review of test results   Ventura Sellers, MD Medical Director of Neuro-Oncology [facility] 05/12/22 1:59 PM

## 2022-05-13 LAB — TSH: TSH: 0.559 u[IU]/mL (ref 0.350–4.500)

## 2022-05-18 ENCOUNTER — Inpatient Hospital Stay: Payer: BC Managed Care – PPO | Attending: Radiation Oncology

## 2022-05-18 DIAGNOSIS — G40909 Epilepsy, unspecified, not intractable, without status epilepticus: Secondary | ICD-10-CM | POA: Insufficient documentation

## 2022-05-18 DIAGNOSIS — C712 Malignant neoplasm of temporal lobe: Secondary | ICD-10-CM | POA: Insufficient documentation

## 2022-05-18 DIAGNOSIS — Z923 Personal history of irradiation: Secondary | ICD-10-CM | POA: Insufficient documentation

## 2022-06-01 ENCOUNTER — Telehealth: Payer: Self-pay | Admitting: *Deleted

## 2022-06-01 NOTE — Telephone Encounter (Signed)
Sent Dr Liana Gerold response via email to [place].

## 2022-06-01 NOTE — Telephone Encounter (Signed)
Email received from friend Arline Asp regarding questions about patients care.  Last lab values show C reactive protein.  She wanted to know could this show inflammation in the brain or else where in the body.    Reports patient acts like she has dementia but then fluctuates back to reality.  What could be contributing to this?  Reports voice fluctuates as well from normal speaking to speaking in a whisper.  Wanted to know what could be causing this?  Also reports her diet hasn't returned to before her fall.  Asks if her thyroid should be checked.

## 2022-06-09 ENCOUNTER — Inpatient Hospital Stay (HOSPITAL_BASED_OUTPATIENT_CLINIC_OR_DEPARTMENT_OTHER): Payer: BC Managed Care – PPO | Admitting: Internal Medicine

## 2022-06-09 ENCOUNTER — Other Ambulatory Visit: Payer: Self-pay

## 2022-06-09 VITALS — BP 119/76 | HR 96 | Temp 97.6°F | Resp 18 | Wt 152.2 lb

## 2022-06-09 DIAGNOSIS — G40909 Epilepsy, unspecified, not intractable, without status epilepticus: Secondary | ICD-10-CM | POA: Diagnosis not present

## 2022-06-09 DIAGNOSIS — C719 Malignant neoplasm of brain, unspecified: Secondary | ICD-10-CM

## 2022-06-09 DIAGNOSIS — C712 Malignant neoplasm of temporal lobe: Secondary | ICD-10-CM | POA: Diagnosis not present

## 2022-06-09 DIAGNOSIS — Z923 Personal history of irradiation: Secondary | ICD-10-CM | POA: Diagnosis not present

## 2022-06-09 NOTE — Progress Notes (Signed)
[facility] at [facility] [address]  [place], [place] [address] [phone]   Interval Evaluation  Date of Service: 06/09/22 Patient Name: Lindsey Daniels Patient MRN: [id number] Patient DOB: [birth year] Provider: Henreitta Leber, MD  Identifying Statement:  Lindsey Daniels is a 67 y.o. female with right temporal  pleomorphic xanthoastrocytoma    Oncologic History: Oncology History  Pleomorphic xanthoastrocytoma (HCC)  09/29/2013 Surgery   Right temporal craniotomy, resection with Dr. Newell Coral.  Path is PXA WHO II.   11/29/2020 Surgery   Disease progression, second craniotomy with Dr. Franky Macho, sub-total resection.  Path is unchanged.   02/21/2021 - 04/02/2021 Radiation Therapy   IMRT radiation therapy with Dr. Mitzi Hansen   12/16/2021 Treatment Plan Change   Completes 4 cycles of avastin /kg for severe inflammation, poor tolerance of steroids     Biomarkers:   Interval History: Lindsey Daniels presents today with her brother.  She remains in the nursing facility, and reports improvement in clarity, memory, alertness and overall cognition over the past month, since stopping the Onfi.  Again, doing some walking on her own, but requiring less help than prior.  Continues on Keppra  BID, decadron and Onfi have been stopped.  No recent falls.  No other recurrence of focal symptoms or seizures.  H+P (01/30/21) Patient presents today after recent re-resection for progressive right temporal PXA.  She initially presented with a seizure in 2015; imaging demonstrated enhancing right temporal lesion which was grossly resected by Dr. Newell Coral.  After last follow up in 2020, she began to experience headaches, ear fullness and some confusion this fall.  Imaging demonstrated marked progression of disease; she went for second surgery on 11/29/20.  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    Medications: Current Outpatient Medications on File Prior to Visit  Medication Sig Dispense Refill   amLODipine (NORVASC) 10 MG tablet Take 1 tablet (10 mg total) by mouth daily.     apixaban (ELIQUIS) 5 MG TABS tablet Take 1 tablet (5 mg total) by mouth 2 (two) times daily. 60 tablet 2   atorvastatin (LIPITOR) 20 MG tablet Take 1 tablet (20 mg total) by mouth daily. 90 tablet 3   cefdinir (OMNICEF) 300 MG capsule Take 300 mg by mouth 2 (two) times daily. For 7 days     cetirizine (ZYRTEC) 10 MG tablet Take 10 mg by mouth daily.     cholecalciferol (VITAMIN D) 1000 units tablet Take 1,000 Units by mouth daily.     cloBAZam (ONFI) 2.5 MG/ML solution Take 2 mLs (5 mg total) by mouth 2 (two) times daily. 18 mL 0   dexamethasone (DECADRON) 4 MG tablet Take 1 tablet (4 mg total) by mouth daily. 60 tablet 0   docusate sodium (COLACE) 100 MG capsule Take 1 capsule (100 mg total) by mouth 2 (two) times daily as needed for mild constipation. 10 capsule 0   feeding supplement (ENSURE ENLIVE / ENSURE PLUS) LIQD Take 237 mLs by mouth 2 (two) times daily between meals. 237 mL 12   fluticasone (FLONASE) 50 MCG/ACT nasal spray Place 1-2 sprays into both nostrils daily. 16 g 0   levETIRAcetam (KEPPRA) 1000 MG tablet Take 1 tablet (1,000 mg total) by mouth 2 (two) times daily.     losartan (COZAAR) 50 MG tablet Take 1 tablet (50 mg total) by mouth daily.     melatonin 5  MG TABS Take 5 mg by mouth at bedtime.     Multiple Vitamin (MULTIVITAMIN WITH MINERALS) TABS tablet Take 1 tablet by mouth at bedtime.     omeprazole (PRILOSEC) 20 MG capsule Take 20 mg by mouth daily.     pantoprazole (PROTONIX) 40 MG tablet Take 1 tablet (40 mg total) by mouth daily.     sertraline (ZOLOFT) 100 MG tablet Take 1 tablet (100 mg total) by mouth daily. (Patient taking differently: Take 100 mg by mouth at bedtime.) 90 tablet 3   traZODone (DESYREL) 50 MG tablet Take 50 mg by mouth at bedtime.     valACYclovir (VALTREX) 500 MG  tablet Take 1 tablet (500 mg total) by mouth 2 (two) times daily as needed (breakout). Takes only if needed for breakouts 30 tablet    No current facility-administered medications on file prior to visit.    Allergies:  Allergies  Allergen Reactions   Rifapentine Other (See Comments)    Flu-like symptoms   Amoxicillin Rash   Clavulanic Acid Rash   Past Medical History:  Past Medical History:  Diagnosis Date   Allergy    Anal fissure    Anxiety    Cataract    Depression    Elevated LFTs    Hyperglycemia    Hyperlipidemia    Hypertension    Osteoarthritis    Pars defect of lumbar spine    Seizures (HCC)    Spondylolisthesis    lumbar   Past Surgical History:  Past Surgical History:  Procedure Laterality Date   cataract     CRANIOTOMY N/A 10/02/2013   Procedure: 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;  Surgeon: Hewitt Shorts, MD;  Location: [facility];  Service: Neurosurgery;  Laterality: N/A;  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   CRANIOTOMY Right 11/29/2020   Procedure: Right Parietal craniotomy for tumor resection;  Surgeon: Coletta Memos, MD;  Location: [facility] OR;  Service: Neurosurgery;  Laterality: Right;   HEEL SPUR SURGERY     Social History:  Social History   Socioeconomic History   Marital status: Divorced    Spouse name: separated-no papers file   Number of children: 0   Years of education: Not on file   Highest education level: Not on file  Occupational History   Occupation: RT    Employer: [employer]    Comment: [employer] and [employer]  Tobacco Use   Smoking status: Never   Smokeless tobacco: Never  Substance and Sexual Activity   Alcohol use: Yes    Alcohol/week: 0.0 - 4.0 standard drinks of alcohol    Comment: social   Drug use: No   Sexual activity: Not Currently    Partners: Male  Other Topics Concern   Not on file  Social History Narrative   Separated from second husband.   Very contentious split.   Social Determinants of Health   Financial Resource Strain: Low Risk  (01/15/2022)   Overall Financial Resource Strain (CARDIA)    Difficulty of Paying Living Expenses: Not hard at all  Food Insecurity: No Food Insecurity (01/15/2022)   Hunger Vital Sign    Worried About Running Out of Food in the Last Year: Never true    Ran Out of Food in the Last Year: Never true  Transportation Needs: No Transportation Needs (01/15/2022)   PRAPARE - Administrator, Civil Service (Medical): No    Lack of Transportation (Non-Medical): No  Physical Activity: Not on file  Stress: Not on file  Social Connections: Not on file  Intimate Partner Violence: Not At Risk (02/06/2021)   Humiliation, Afraid, Rape, and Kick questionnaire    Fear of Current or Ex-Partner: No    Emotionally Abused: No    Physically Abused: No    Sexually Abused: No   Family History:  Family History  Problem Relation Age of Onset   Mental illness Father        depression   COPD Father    Alcohol abuse Brother    Breast cancer Neg Hx     Review of Systems: Constitutional: Doesn't report fevers, chills or abnormal weight loss Eyes: Doesn't report blurriness of vision Ears, nose, mouth, throat, and face: Doesn't report sore throat Respiratory: +dyspnea Cardiovascular: Doesn't report palpitation, chest discomfort  Gastrointestinal:  Doesn't report nausea, constipation, diarrhea GU: Doesn't report incontinence Skin: Doesn't report skin rashes Neurological: Per HPI Musculoskeletal: Doesn't report joint pain Behavioral/Psych: Doesn't report anxiety  Physical Exam: Vitals:   06/09/22 1238  BP: 119/76  Pulse: 96  Resp: 18  Temp: 97.6 F (36.4 C)  SpO2: 99%   KPS: 70. General: cushingoid Head: Normal EENT: No conjunctival injection or scleral icterus.  Lungs: Normal Cardiac: Regular rate Abdomen: Non-distended abdomen Skin: +purpura, arms and legs Extremities:  +edema  Neurologic Exam: Mental Status: Drowsy, alert, attentive to examiner with conversation. Oriented to self and environment. Language is fluent with intact comprehension.  Displays some disorganized thinking. Age advanced psychomotor slowing. Cranial Nerves: Visual acuity is grossly normal. Visual fields are full. Extra-ocular movements intact. No ptosis. Face is symmetric Motor: Tone and bulk are normal. Power is 4+/5 in left arm, 5/5 in left leg. Reflexes are symmetric, no pathologic reflexes present.  Sensory: Intact to light touch Gait: Deferred   Labs: I have reviewed the data as listed    Component Value Date/Time   NA 141 05/12/2022 1510   NA 142 06/30/2017 1815   K 4.2 05/12/2022 1510   CL 107 05/12/2022 1510   CO2 23 05/12/2022 1510   GLUCOSE 169 (H) 05/12/2022 1510   BUN 18 05/12/2022 1510   BUN 14 06/30/2017 1815   CREATININE 0.62 05/12/2022 1510   CREATININE 0.72 06/07/2015 0825   CALCIUM 9.9 05/12/2022 1510   PROT 7.2 05/12/2022 1510   PROT 7.0 06/30/2017 1815   ALBUMIN 4.1 05/12/2022 1510   ALBUMIN 4.6 06/30/2017 1815   AST 25 05/12/2022 1510   ALT 32 05/12/2022 1510   ALKPHOS 111 05/12/2022 1510   BILITOT 0.7 05/12/2022 1510   GFRNONAA >60 05/12/2022 1510   GFRAA  01/18/2022 0405    UNSATISFACTORY SPECIMEN. CLIENT REQUESTS SPECIMEN TO BE ANALYZED.   Lab Results  Component Value Date   WBC 7.7 05/12/2022   NEUTROABS 6.0 05/12/2022   HGB 11.7 (L) 05/12/2022   HCT 35.9 (L) 05/12/2022   MCV 97.6 05/12/2022   PLT 495 (H) 05/12/2022     Assessment/Plan Pleomorphic xanthoastrocytoma (HCC)  Seizure disorder (HCC)  Kathlee Barnhardt is clinically improved today with regards to post-ICU encephalopathy.  Most likely improvements can be attributed to cessation of clobazam.  She is still below her pre-admission baseline.  We recommended to continue Keppra 1000mg  BID as prior.    Serum studies showed elevated CRP and ESR, c/w known CNS inflammation and (at  the time) urethritis infection.  She will continue to work aggressively with PT as discussed.  We ask that Germaine Pomfret Holtsclaw return  to clinic in 1 month with MRI brain for evaluation, or sooner if needed.  All questions were answered. The patient knows to call the clinic with any problems, questions or concerns. No barriers to learning were detected.  The total time spent in the encounter was 30 minutes and more than 50% was on counseling and review of test results   Henreitta Leber, MD Medical Director of Neuro-Oncology [facility] at [facility] 06/09/22 12:29 PM

## 2022-06-15 ENCOUNTER — Telehealth: Payer: Self-pay | Admitting: *Deleted

## 2022-06-15 NOTE — Telephone Encounter (Signed)
Completed Disability form for April 2024 and faxed.

## 2022-07-09 ENCOUNTER — Emergency Department (HOSPITAL_COMMUNITY)
Admission: EM | Admit: 2022-07-09 | Discharge: 2022-07-10 | Disposition: A | Discharge status: Home / Self Care | Payer: BC Managed Care – PPO | Attending: Emergency Medicine | Admitting: Emergency Medicine

## 2022-07-09 ENCOUNTER — Emergency Department (HOSPITAL_COMMUNITY)
Admission: RE | Admit: 2022-07-09 | Discharge: 2022-07-09 | Disposition: A | Discharge status: Home / Self Care | Payer: BC Managed Care – PPO | Source: Ambulatory Visit | Attending: Internal Medicine | Admitting: Internal Medicine

## 2022-07-09 DIAGNOSIS — I10 Essential (primary) hypertension: Secondary | ICD-10-CM | POA: Insufficient documentation

## 2022-07-09 DIAGNOSIS — C719 Malignant neoplasm of brain, unspecified: Secondary | ICD-10-CM | POA: Insufficient documentation

## 2022-07-09 DIAGNOSIS — R112 Nausea with vomiting, unspecified: Secondary | ICD-10-CM | POA: Diagnosis present

## 2022-07-09 DIAGNOSIS — Z7901 Long term (current) use of anticoagulants: Secondary | ICD-10-CM | POA: Insufficient documentation

## 2022-07-09 DIAGNOSIS — D72829 Elevated white blood cell count, unspecified: Secondary | ICD-10-CM | POA: Diagnosis not present

## 2022-07-09 DIAGNOSIS — E876 Hypokalemia: Secondary | ICD-10-CM | POA: Diagnosis not present

## 2022-07-09 DIAGNOSIS — A0472 Enterocolitis due to Clostridium difficile, not specified as recurrent: Secondary | ICD-10-CM | POA: Diagnosis not present

## 2022-07-09 DIAGNOSIS — Z79899 Other long term (current) drug therapy: Secondary | ICD-10-CM | POA: Diagnosis not present

## 2022-07-09 DIAGNOSIS — R000 Tachycardia, unspecified: Secondary | ICD-10-CM | POA: Insufficient documentation

## 2022-07-09 MED ORDER — GADOBUTROL 1 MMOL/ML IV SOLN
7.0000 mL | Freq: Once | INTRAVENOUS | Status: AC | PRN
  Administered 2022-07-09: 7 mL via INTRAVENOUS

## 2022-07-10 ENCOUNTER — Emergency Department (HOSPITAL_COMMUNITY): Payer: BC Managed Care – PPO

## 2022-07-10 ENCOUNTER — Encounter (HOSPITAL_COMMUNITY): Payer: Self-pay | Admitting: Emergency Medicine

## 2022-07-10 ENCOUNTER — Other Ambulatory Visit: Payer: Self-pay

## 2022-07-10 LAB — CBC WITH DIFFERENTIAL/PLATELET
Abs Immature Granulocytes: 0.1 10*3/uL — ABNORMAL HIGH (ref 0.00–0.07)
Basophils Absolute: 0.1 10*3/uL (ref 0.0–0.1)
Basophils Relative: 0 %
Eosinophils Absolute: 0 10*3/uL (ref 0.0–0.5)
Eosinophils Relative: 0 %
HCT: 39.6 % (ref 36.0–46.0)
Hemoglobin: 13 g/dL (ref 12.0–15.0)
Immature Granulocytes: 1 %
Lymphocytes Relative: 5 %
Lymphs Abs: 0.9 10*3/uL (ref 0.7–4.0)
MCH: 29 pg (ref 26.0–34.0)
MCHC: 32.8 g/dL (ref 30.0–36.0)
MCV: 88.4 fL (ref 80.0–100.0)
Monocytes Absolute: 1.9 10*3/uL — ABNORMAL HIGH (ref 0.1–1.0)
Monocytes Relative: 11 %
Neutro Abs: 14.3 10*3/uL — ABNORMAL HIGH (ref 1.7–7.7)
Neutrophils Relative %: 83 %
Platelets: 223 10*3/uL (ref 150–400)
RBC: 4.48 MIL/uL (ref 3.87–5.11)
RDW: 13.6 % (ref 11.5–15.5)
WBC: 17.3 10*3/uL — ABNORMAL HIGH (ref 4.0–10.5)
nRBC: 0 % (ref 0.0–0.2)

## 2022-07-10 LAB — COMPREHENSIVE METABOLIC PANEL
ALT: 16 U/L (ref 0–44)
AST: 20 U/L (ref 15–41)
Albumin: 3.3 g/dL — ABNORMAL LOW (ref 3.5–5.0)
Alkaline Phosphatase: 91 U/L (ref 38–126)
Anion gap: 13 (ref 5–15)
BUN: 9 mg/dL (ref 8–23)
CO2: 19 mmol/L — ABNORMAL LOW (ref 22–32)
Calcium: 8.5 mg/dL — ABNORMAL LOW (ref 8.9–10.3)
Chloride: 100 mmol/L (ref 98–111)
Creatinine, Ser: 0.46 mg/dL (ref 0.44–1.00)
GFR, Estimated: >60 mL/min (ref >60)
Glucose, Bld: 128 mg/dL — ABNORMAL HIGH (ref 70–99)
Potassium: 2.9 mmol/L — ABNORMAL LOW (ref 3.5–5.1)
Sodium: 132 mmol/L — ABNORMAL LOW (ref 135–145)
Total Bilirubin: 1.5 mg/dL — ABNORMAL HIGH (ref 0.3–1.2)
Total Protein: 6.4 g/dL — ABNORMAL LOW (ref 6.5–8.1)

## 2022-07-10 LAB — URINALYSIS, ROUTINE W REFLEX MICROSCOPIC
Bilirubin Urine: NEGATIVE
Glucose, UA: NEGATIVE mg/dL
Hgb urine dipstick: NEGATIVE
Ketones, ur: 20 mg/dL — AB
Leukocytes,Ua: NEGATIVE
Nitrite: NEGATIVE
Protein, ur: NEGATIVE mg/dL
Specific Gravity, Urine: <=1.005 (ref 1.005–1.030)
pH: 6 (ref 5.0–8.0)

## 2022-07-10 LAB — C DIFFICILE QUICK SCREEN W PCR REFLEX
C Diff antigen: POSITIVE — AB
C Diff interpretation: DETECTED
C Diff toxin: POSITIVE — AB

## 2022-07-10 LAB — LIPASE, BLOOD: Lipase: 34 U/L (ref 11–51)

## 2022-07-10 MED ORDER — POTASSIUM CHLORIDE 10 MEQ/100ML IV SOLN
10.0000 meq | INTRAVENOUS | Status: AC
  Administered 2022-07-10 (×2): 10 meq via INTRAVENOUS
  Filled 2022-07-10 (×2): qty 100

## 2022-07-10 MED ORDER — POTASSIUM CHLORIDE CRYS ER 20 MEQ PO TBCR: 20.0000 meq | EXTENDED_RELEASE_TABLET | Freq: Two times a day (BID) | ORAL | 0 refills | Status: DC

## 2022-07-10 MED ORDER — VANCOMYCIN HCL 125 MG PO CAPS
125.0000 mg | ORAL_CAPSULE | Freq: Four times a day (QID) | ORAL | Status: DC
  Administered 2022-07-10: 125 mg via ORAL
  Filled 2022-07-10 (×2): qty 1

## 2022-07-10 MED ORDER — VANCOMYCIN HCL 125 MG PO CAPS: 125.0000 mg | ORAL_CAPSULE | Freq: Four times a day (QID) | ORAL | 0 refills | Status: DC

## 2022-07-10 MED ORDER — SODIUM CHLORIDE 0.9 % IV BOLUS
1000.0000 mL | Freq: Once | INTRAVENOUS | Status: AC
  Administered 2022-07-10: 1000 mL via INTRAVENOUS

## 2022-07-10 MED ORDER — IOHEXOL 300 MG/ML  SOLN
100.0000 mL | Freq: Once | INTRAMUSCULAR | Status: AC | PRN
  Administered 2022-07-10: 100 mL via INTRAVENOUS

## 2022-07-10 MED ORDER — SODIUM CHLORIDE (PF) 0.9 % IJ SOLN
INTRAMUSCULAR | Status: AC
  Filled 2022-07-10: qty 50

## 2022-07-10 NOTE — ED Triage Notes (Incomplete)
[facility]  N/v/d for 3 days  Zofran pta  Lethargic

## 2022-07-10 NOTE — Discharge Instructions (Addendum)
You were seen today for vomiting and diarrhea.  He has evidence of pancolitis and you were C. difficile positive.  Make sure that you are vigilantly washing your hands.  Take medication as prescribed.

## 2022-07-10 NOTE — ED Provider Notes (Signed)
[place] EMERGENCY DEPARTMENT AT [facility] Provider Note   CSN: [id number] Arrival date & time: 07/09/22  2355     History  Chief Complaint  Patient presents with   Abdominal Pain    Lindsey Daniels is a 67 y.o. female.  HPI     This is a 39 female who presents for manage per with nausea, vomiting, diarrhea, abdominal pain.  Patient reports 1 week history of worsening nausea, vomiting.  No bloody stools or bloody emesis.  She describes crampy abdominal pain which she compares to "a [place] devil in my stomach."  Reports she has not had an appetite.  No fevers.  No known sick contacts.  States she was given nausea medicine with minimal relief.  Home Medications Prior to Admission medications   Medication Sig Start Date End Date Taking? Authorizing Provider  potassium chloride SA (KLOR-CON M) 20 MEQ tablet Take 1 tablet (20 mEq total) by mouth 2 (two) times daily. 07/10/22  Yes Rowene Suto, Mayer Masker, MD  vancomycin (VANCOCIN) 125 MG capsule Take 1 capsule (125 mg total) by mouth 4 (four) times daily. 07/10/22  Yes Jermone Geister, Mayer Masker, MD  amLODipine (NORVASC) 10 MG tablet Take 1 tablet (10 mg total) by mouth daily. 10/06/21   Meredeth Ide, MD  apixaban (ELIQUIS) 5 MG TABS tablet Take 1 tablet (5 mg total) by mouth 2 (two) times daily. 11/18/21   Henreitta Leber, MD  atorvastatin (LIPITOR) 20 MG tablet Take 1 tablet (20 mg total) by mouth daily. 07/25/17   Porfirio Oar, PA  cefdinir (OMNICEF) 300 MG capsule Take 300 mg by mouth 2 (two) times daily. For 7 days    [provider]  cetirizine (ZYRTEC) 10 MG tablet Take 10 mg by mouth daily.    [provider]  cholecalciferol (VITAMIN D) 1000 units tablet Take 1,000 Units by mouth daily.    [provider]  cloBAZam (ONFI) 2.5 MG/ML solution Take 2 mLs (5 mg total) by mouth 2 (two) times daily. 04/14/22   Vaslow, Georgeanna Lea, MD  dexamethasone (DECADRON) 4 MG tablet Take 1 tablet (4 mg total) by mouth  daily. 04/14/22   Henreitta Leber, MD  docusate sodium (COLACE) 100 MG capsule Take 1 capsule (100 mg total) by mouth 2 (two) times daily as needed for mild constipation. 01/29/22   Joseph Art, DO  feeding supplement (ENSURE ENLIVE / ENSURE PLUS) LIQD Take 237 mLs by mouth 2 (two) times daily between meals. 01/29/22   Joseph Art, DO  fluticasone (FLONASE) 50 MCG/ACT nasal spray Place 1-2 sprays into both nostrils daily. 11/06/20   Wieters, Hallie C, PA-C  levETIRAcetam (KEPPRA) 1000 MG tablet Take 1 tablet (1,000 mg total) by mouth 2 (two) times daily. 01/29/22   Joseph Art, DO  losartan (COZAAR) 50 MG tablet Take 1 tablet (50 mg total) by mouth daily. 10/06/21   Meredeth Ide, MD  melatonin 5 MG TABS Take 5 mg by mouth at bedtime.    [provider]  Multiple Vitamin (MULTIVITAMIN WITH MINERALS) TABS tablet Take 1 tablet by mouth at bedtime.    [provider]  omeprazole (PRILOSEC) 20 MG capsule Take 20 mg by mouth daily.    [provider]  pantoprazole (PROTONIX) 40 MG tablet Take 1 tablet (40 mg total) by mouth daily. 10/06/21   Meredeth Ide, MD  sertraline (ZOLOFT) 100 MG tablet Take 1 tablet (100 mg total) by mouth daily. Patient taking  differently: Take 100 mg by mouth at bedtime. 06/30/17   Porfirio Oar, PA  traZODone (DESYREL) 50 MG tablet Take 50 mg by mouth at bedtime.    [provider]  valACYclovir (VALTREX) 500 MG tablet Take 1 tablet (500 mg total) by mouth 2 (two) times daily as needed (breakout). Takes only if needed for breakouts 01/29/22   Joseph Art, DO      Allergies    Rifapentine, Amoxicillin, and Clavulanic acid    Review of Systems   Review of Systems  Respiratory:  Negative for shortness of breath.   Cardiovascular:  Negative for chest pain.  Gastrointestinal:  Positive for abdominal pain, diarrhea, nausea and vomiting.  All other systems reviewed and are negative.   Physical Exam Updated Vital Signs BP  110/70   Pulse 89   Temp 97.8 F (36.6 C) (Oral)   Resp 17   Ht 1.6 m (5\' 3" )   Wt 69 kg   SpO2 95%   BMI 26.95 kg/m  Physical Exam Vitals and nursing note reviewed.  Constitutional:      Comments: Elderly, chronically ill-appearing, no acute distress  HENT:     Head: Normocephalic and atraumatic.     Mouth/Throat:     Mouth: Mucous membranes are dry.  Eyes:     Pupils: Pupils are equal, round, and reactive to light.  Cardiovascular:     Rate and Rhythm: Regular rhythm. Tachycardia present.     Heart sounds: Normal heart sounds.  Pulmonary:     Effort: Pulmonary effort is normal. No respiratory distress.     Breath sounds: No wheezing.  Abdominal:     General: Bowel sounds are increased.     Palpations: Abdomen is soft.     Tenderness: There is no abdominal tenderness.  Musculoskeletal:     Cervical back: Neck supple.  Skin:    General: Skin is warm and dry.  Neurological:     Mental Status: She is alert and oriented to person, place, and time.     ED Results / Procedures / Treatments   Labs (all labs ordered are listed, but only abnormal results are displayed) Labs Reviewed  C DIFFICILE QUICK SCREEN W PCR REFLEX   - Abnormal; Notable for the following components:      Result Value   C Diff antigen POSITIVE (*)    C Diff toxin POSITIVE (*)    All other components within normal limits  CBC WITH DIFFERENTIAL/PLATELET - Abnormal; Notable for the following components:   WBC 17.3 (*)    Neutro Abs 14.3 (*)    Monocytes Absolute 1.9 (*)    Abs Immature Granulocytes 0.10 (*)    All other components within normal limits  COMPREHENSIVE METABOLIC PANEL - Abnormal; Notable for the following components:   Sodium 132 (*)    Potassium 2.9 (*)    CO2 19 (*)    Glucose, Bld 128 (*)    Calcium 8.5 (*)    Total Protein 6.4 (*)    Albumin 3.3 (*)    Total Bilirubin 1.5 (*)    All other components within normal limits  URINALYSIS, ROUTINE W REFLEX MICROSCOPIC - Abnormal;  Notable for the following components:   Color, Urine STRAW (*)    Ketones, ur 20 (*)    All other components within normal limits  LIPASE, BLOOD    EKG None  Radiology CT ABDOMEN PELVIS W CONTRAST  Result Date: 07/10/2022 CLINICAL DATA:  Abdominal pain. EXAM: CT  ABDOMEN AND PELVIS WITH CONTRAST TECHNIQUE: Multidetector CT imaging of the abdomen and pelvis was performed using the standard protocol following bolus administration of intravenous contrast. RADIATION DOSE REDUCTION: This exam was performed according to the departmental dose-optimization program which includes automated exposure control, adjustment of the mA and/or kV according to patient size and/or use of iterative reconstruction technique. CONTRAST:  OMNIPAQUE IOHEXOL 300 MG/ML  SOLN COMPARISON:  December 01, 2004 FINDINGS: Lower chest: No acute abnormality. Hepatobiliary: Stable hepatic cysts are noted. An ill-defined area of focal fatty infiltration is seen within the right lobe of the liver, anterior to the gallbladder fossa. Tiny gallstones are seen within the dependent portion of a mildly distended gallbladder. There is no evidence of gallbladder wall thickening, pericholecystic inflammation or biliary dilatation. Pancreas: Unremarkable. No pancreatic ductal dilatation or surrounding inflammatory changes. Spleen: Normal in size without focal abnormality. Adrenals/Urinary Tract: Adrenal glands are unremarkable. Kidneys are normal, without renal calculi, focal lesion, or hydronephrosis. Bladder is unremarkable. Stomach/Bowel: Stomach is within normal limits. Appendix appears normal. No evidence of bowel dilatation. Moderate severity diffuse colitis is seen throughout all aspects of the large bowel. Vascular/Lymphatic: No significant vascular findings are present. No enlarged abdominal or pelvic lymph nodes. Reproductive: A 2.9 cm diameter partially calcified heterogeneous uterine fibroid is seen along the anterior aspect of the  uterine fundus on the right. The bilateral adnexa are unremarkable. Other: No abdominal wall hernia or abnormality. No abdominopelvic ascites. Musculoskeletal: A chronic compression fracture deformity is seen involving the L1 vertebral body. IMPRESSION: 1. Moderate severity diffuse colitis. 2. Cholelithiasis. 3. Stable hepatic cysts. 4. Calcified uterine fibroid. 5. Chronic compression fracture deformity involving the L1 vertebral body. Electronically Signed   By: Aram Candela M.D.   On: 07/10/2022 02:32    Procedures .Critical Care  Performed by: Shon Baton, MD Authorized by: Shon Baton, MD   Critical care provider statement:    Critical care time (minutes):  31   Critical care was necessary to treat or prevent imminent or life-threatening deterioration of the following conditions:  Dehydration (Hypokalemia)   Critical care was time spent personally by me on the following activities:  Development of treatment plan with patient or surrogate, discussions with consultants, evaluation of patient's response to treatment, examination of patient, ordering and review of laboratory studies, ordering and review of radiographic studies, ordering and performing treatments and interventions, pulse oximetry, re-evaluation of patient's condition and review of old charts     Medications Ordered in ED Medications  vancomycin (VANCOCIN) capsule 125 mg (125 mg Oral Given 07/10/22 0533)  sodium chloride 0.9 % bolus 1,000 mL (0 mLs Intravenous Stopped 07/10/22 0306)  potassium chloride 10 mEq in 100 mL IVPB (0 mEq Intravenous Stopped 07/10/22 0427)  iohexol (OMNIPAQUE) 300 MG/ML solution 100 mL (100 mLs Intravenous Contrast Given 07/10/22 0211)    ED Course/ Medical Decision Making/ A&P                             Medical Decision Making Amount and/or Complexity of Data Reviewed Labs: ordered. Radiology: ordered.  Risk Prescription drug management.   This patient presents to the ED  for concern of abdominal pain, vomiting, diarrhea, this involves an extensive number of treatment options, and is a complaint that carries with it a high risk of complications and morbidity.  I considered the following differential and admission for this acute, potentially life threatening condition.  The differential diagnosis includes  gastritis, gastroenteritis, pancreatitis, colitis  MDM:    This is a 67 year old female who presents with abdominal pain, nausea, and diarrhea.  She is overall nontoxic-appearing.  She is mildly tachycardic likely related to dehydration.  Patient was given IV fluids, Imodium, Zofran.  Labs obtained and reviewed.  Patient with mild hypokalemia to 2.9.  This was replaced IV.  She has a leukocytosis.  For this reason, colitis.  She is in a living facility.  She would be at increased risk for C. difficile.  Patient provided a stool sample and this did test positive.  She is hemodynamically stable with heart rate after fluids.  Feel that she can be treated as an outpatient.  She was given 1 dose of vancomycin here and will be discharged on oral vancomycin.  Confirmed that she can return C. difficile positive.  (Labs, imaging, consults)  Labs: I Ordered, and personally interpreted labs.  The pertinent results include: CBC, CMP, urinalysis, lipase  Imaging Studies ordered: I ordered imaging studies including CT abdomen pelvis I independently visualized and interpreted imaging. I agree with the radiologist interpretation  Additional history obtained from chart review.  External records from outside source obtained and reviewed including prior evaluations  Cardiac Monitoring: The patient was maintained on a cardiac monitor.  If on the cardiac monitor, I personally viewed and interpreted the cardiac monitored which showed an underlying rhythm of: Sinus rhythm  Reevaluation: After the interventions noted above, I reevaluated the patient and found that they have  :improved  Social Determinants of Health:  lives at facility  Disposition: Discharge  Co morbidities that complicate the patient evaluation  Past Medical History:  Diagnosis Date   Allergy    Anal fissure    Anxiety    Cataract    Depression    Elevated LFTs    Hyperglycemia    Hyperlipidemia    Hypertension    Osteoarthritis    Pars defect of lumbar spine    Seizures (HCC)    Spondylolisthesis    lumbar     Medicines Meds ordered this encounter  Medications   sodium chloride 0.9 % bolus 1,000 mL   potassium chloride 10 mEq in 100 mL IVPB   iohexol (OMNIPAQUE) 300 MG/ML solution 100 mL   vancomycin (VANCOCIN) capsule 125 mg   vancomycin (VANCOCIN) 125 MG capsule    Sig: Take 1 capsule (125 mg total) by mouth 4 (four) times daily.    Dispense:  40 capsule    Refill:  0   potassium chloride SA (KLOR-CON M) 20 MEQ tablet    Sig: Take 1 tablet (20 mEq total) by mouth 2 (two) times daily.    Dispense:  20 tablet    Refill:  0    I have reviewed the patients home medicines and have made adjustments as needed  Problem List / ED Course: Problem List Items Addressed This Visit       Other   Hypokalemia   Other Visit Diagnoses     C. difficile colitis    -  Primary   Relevant Medications   vancomycin (VANCOCIN) capsule 125 mg (Start on 07/10/2022  6:00 AM)   vancomycin (VANCOCIN) 125 MG capsule                   Final Clinical Impression(s) / ED Diagnoses Final diagnoses:  C. difficile colitis  Hypokalemia    Rx / DC Orders ED Discharge Orders  Ordered    vancomycin (VANCOCIN) 125 MG capsule  4 times daily        07/10/22 0550    potassium chloride SA (KLOR-CON M) 20 MEQ tablet  2 times daily        07/10/22 0553              Marna Weniger, Mayer Masker, MD 07/10/22 [phone]

## 2022-07-10 NOTE — ED Notes (Signed)
[facility] called  

## 2022-07-10 NOTE — ED Triage Notes (Signed)
Pt bib ems from [facility] c/o abdominal pain, nausea, vomiting, and diarrhea for 3 days with inability to keep food/fluids down. 1 zofran given by facility PTA.   BP 120/68, HR 118, RR 18, Spo2 94%

## 2022-07-21 ENCOUNTER — Inpatient Hospital Stay: Payer: BC Managed Care – PPO | Attending: Radiation Oncology | Admitting: Internal Medicine

## 2022-07-21 ENCOUNTER — Other Ambulatory Visit: Payer: Self-pay

## 2022-07-21 VITALS — BP 135/91 | HR 104 | Temp 98.4°F | Resp 18 | Ht 63.0 in | Wt 137.2 lb

## 2022-07-21 DIAGNOSIS — C712 Malignant neoplasm of temporal lobe: Secondary | ICD-10-CM | POA: Diagnosis present

## 2022-07-21 DIAGNOSIS — G40909 Epilepsy, unspecified, not intractable, without status epilepticus: Secondary | ICD-10-CM

## 2022-07-21 DIAGNOSIS — C719 Malignant neoplasm of brain, unspecified: Secondary | ICD-10-CM | POA: Diagnosis not present

## 2022-07-21 NOTE — Progress Notes (Signed)
[facility] [address]  [place], [place] [address] [phone]   Interval Evaluation  Date of Service: 07/21/22 Patient Name: Lindsey Daniels Patient MRN: [id number] Patient DOB: 08/09/1955 Provider: Henreitta Leber, MD  Identifying Statement:  Lindsey Daniels is a 67 y.o. female with right temporal  pleomorphic xanthoastrocytoma    Oncologic History: Oncology History  Pleomorphic xanthoastrocytoma (HCC)  09/29/2013 Surgery   Right temporal craniotomy, resection with Dr. Newell Coral.  Path is PXA WHO II.   11/29/2020 Surgery   Disease progression, second craniotomy with Dr. Franky Macho, sub-total resection.  Path is unchanged.   02/21/2021 - 04/02/2021 Radiation Therapy   IMRT radiation therapy with Dr. Mitzi Hansen   12/16/2021 Treatment Plan Change   Completes 4 cycles of avastin 10mg /kg for severe inflammation, poor tolerance of steroids     Biomarkers:   Interval History: Lindsey Daniels presents today for evaluation following MRI brain.  She remains remains on antibiotics for C. Diff, symptoms have improved.  Again, doing some walking on her own, but requiring less help than prior.  Continues on Keppra 1000mg  BID.  No recent falls.  No other recurrence of focal symptoms or seizures.  H+P (01/30/21) Patient presents today after recent re-resection for progressive right temporal PXA.  She initially presented with a seizure in 2015; imaging demonstrated enhancing right temporal lesion which was grossly resected by Dr. Newell Coral.  After last follow up in 2020, she began to experience headaches, ear fullness and some confusion this fall.  Imaging demonstrated marked progression of disease; she went for second surgery on 11/29/20.  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   Medications: Current Outpatient Medications on File Prior to Visit  Medication Sig Dispense Refill    amLODipine (NORVASC) 10 MG tablet Take 1 tablet (10 mg total) by mouth daily.     apixaban (ELIQUIS) 5 MG TABS tablet Take 1 tablet (5 mg total) by mouth 2 (two) times daily. 60 tablet 2   atorvastatin (LIPITOR) 20 MG tablet Take 1 tablet (20 mg total) by mouth daily. 90 tablet 3   cefdinir (OMNICEF) 300 MG capsule Take 300 mg by mouth 2 (two) times daily. For 7 days     cetirizine (ZYRTEC) 10 MG tablet Take 10 mg by mouth daily.     cholecalciferol (VITAMIN D) 1000 units tablet Take 1,000 Units by mouth daily.     cloBAZam (ONFI) 2.5 MG/ML solution Take 2 mLs (5 mg total) by mouth 2 (two) times daily. 18 mL 0   dexamethasone (DECADRON) 4 MG tablet Take 1 tablet (4 mg total) by mouth daily. 60 tablet 0   docusate sodium (COLACE) 100 MG capsule Take 1 capsule (100 mg total) by mouth 2 (two) times daily as needed for mild constipation. 10 capsule 0   feeding supplement (ENSURE ENLIVE / ENSURE PLUS) LIQD Take 237 mLs by mouth 2 (two) times daily between meals. 237 mL 12   fluticasone (FLONASE) 50 MCG/ACT nasal spray Place 1-2 sprays into both nostrils daily. 16 g 0   levETIRAcetam (KEPPRA) 1000 MG tablet Take 1 tablet (1,000 mg total) by mouth 2 (two) times daily.     losartan (COZAAR) 50 MG tablet Take 1 tablet (50 mg total) by mouth daily.     melatonin 5 MG TABS Take 5 mg by mouth at bedtime.     Multiple Vitamin (MULTIVITAMIN WITH  MINERALS) TABS tablet Take 1 tablet by mouth at bedtime.     omeprazole (PRILOSEC) 20 MG capsule Take 20 mg by mouth daily.     pantoprazole (PROTONIX) 40 MG tablet Take 1 tablet (40 mg total) by mouth daily.     potassium chloride SA (KLOR-CON M) 20 MEQ tablet Take 1 tablet (20 mEq total) by mouth 2 (two) times daily. 20 tablet 0   sertraline (ZOLOFT) 100 MG tablet Take 1 tablet (100 mg total) by mouth daily. (Patient taking differently: Take 100 mg by mouth at bedtime.) 90 tablet 3   traZODone (DESYREL) 50 MG tablet Take 50 mg by mouth at bedtime.     valACYclovir  (VALTREX) 500 MG tablet Take 1 tablet (500 mg total) by mouth 2 (two) times daily as needed (breakout). Takes only if needed for breakouts 30 tablet    vancomycin (VANCOCIN) 125 MG capsule Take 1 capsule (125 mg total) by mouth 4 (four) times daily. 40 capsule 0   No current facility-administered medications on file prior to visit.    Allergies:  Allergies  Allergen Reactions   Rifapentine Other (See Comments)    Flu-like symptoms   Amoxicillin Rash   Clavulanic Acid Rash   Past Medical History:  Past Medical History:  Diagnosis Date   Allergy    Anal fissure    Anxiety    Cataract    Depression    Elevated LFTs    Hyperglycemia    Hyperlipidemia    Hypertension    Osteoarthritis    Pars defect of lumbar spine    Seizures (HCC)    Spondylolisthesis    lumbar   Past Surgical History:  Past Surgical History:  Procedure Laterality Date   cataract     CRANIOTOMY N/A 10/02/2013   Procedure: 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;  Surgeon: Hewitt Shorts, MD;  Location: [facility];  Service: Neurosurgery;  Laterality: N/A;  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   CRANIOTOMY Right 11/29/2020   Procedure: Right Parietal craniotomy for tumor resection;  Surgeon: Coletta Memos, MD;  Location: [facility];  Service: Neurosurgery;  Laterality: Right;   HEEL SPUR SURGERY     Social History:  Social History   Socioeconomic History   Marital status: Divorced    Spouse name: separated-no papers file   Number of children: 0   Years of education: Not on file   Highest education level: Not on file  Occupational History   Occupation: RT    Employer: [employer]    Comment: [employer] and [employer]  Tobacco Use   Smoking status: Never   Smokeless tobacco: Never  Substance and Sexual Activity   Alcohol use: Yes    Alcohol/week: 0.0 - 4.0 standard drinks of alcohol    Comment: social   Drug use: No   Sexual activity: Not  Currently    Partners: Male  Other Topics Concern   Not on file  Social History Narrative   Separated from second husband.  Very contentious split.   Social Determinants of Health   Financial Resource Strain: Low Risk  (01/15/2022)   Overall Financial Resource Strain (CARDIA)    Difficulty of Paying Living Expenses: Not hard at all  Food Insecurity: No Food Insecurity (01/15/2022)   Hunger Vital Sign    Worried About Running Out of Food in the Last Year: Never true    Ran Out of Food in the Last Year:  Never true  Transportation Needs: No Transportation Needs (01/15/2022)   PRAPARE - Administrator, Civil Service (Medical): No    Lack of Transportation (Non-Medical): No  Physical Activity: Not on file  Stress: Not on file  Social Connections: Not on file  Intimate Partner Violence: Not At Risk (02/06/2021)   Humiliation, Afraid, Rape, and Kick questionnaire    Fear of Current or Ex-Partner: No    Emotionally Abused: No    Physically Abused: No    Sexually Abused: No   Family History:  Family History  Problem Relation Age of Onset   Mental illness Father        depression   COPD Father    Alcohol abuse Brother    Breast cancer Neg Hx     Review of Systems: Constitutional: Doesn't report fevers, chills or abnormal weight loss Eyes: Doesn't report blurriness of vision Ears, nose, mouth, throat, and face: Doesn't report sore throat Respiratory: +dyspnea Cardiovascular: Doesn't report palpitation, chest discomfort  Gastrointestinal:  Doesn't report nausea, constipation, diarrhea GU: Doesn't report incontinence Skin: Doesn't report skin rashes Neurological: Per HPI Musculoskeletal: Doesn't report joint pain Behavioral/Psych: Doesn't report anxiety  Physical Exam: Vitals:   07/21/22 1100  BP: (!) 135/91  Pulse: (!) 104  Resp: 18  Temp: 98.4 F (36.9 C)  SpO2: 96%   KPS: 70. General: cushingoid Head: Normal EENT: No conjunctival injection or scleral  icterus.  Lungs: Normal Cardiac: Regular rate Abdomen: Non-distended abdomen Skin: +purpura, arms and legs Extremities: +edema  Neurologic Exam: Mental Status: Drowsy, alert, attentive to examiner with conversation. Oriented to self and environment. Language is fluent with intact comprehension.  Displays some disorganized thinking. Age advanced psychomotor slowing. Cranial Nerves: Visual acuity is grossly normal. Visual fields are full. Extra-ocular movements intact. No ptosis. Face is symmetric Motor: Tone and bulk are normal. Power is 4+/5 in left arm, 5/5 in left leg. Reflexes are symmetric, no pathologic reflexes present.  Sensory: Intact to light touch Gait: Deferred   Labs: I have reviewed the data as listed    Component Value Date/Time   NA 132 (L) 07/10/2022 0050   NA 142 06/30/2017 1815   K 2.9 (L) 07/10/2022 0050   CL 100 07/10/2022 0050   CO2 19 (L) 07/10/2022 0050   GLUCOSE 128 (H) 07/10/2022 0050   BUN 9 07/10/2022 0050   BUN 14 06/30/2017 1815   CREATININE 0.46 07/10/2022 0050   CREATININE 0.62 05/12/2022 1510   CREATININE 0.72 06/07/2015 0825   CALCIUM 8.5 (L) 07/10/2022 0050   PROT 6.4 (L) 07/10/2022 0050   PROT 7.0 06/30/2017 1815   ALBUMIN 3.3 (L) 07/10/2022 0050   ALBUMIN 4.6 06/30/2017 1815   AST 20 07/10/2022 0050   AST 25 05/12/2022 1510   ALT 16 07/10/2022 0050   ALT 32 05/12/2022 1510   ALKPHOS 91 07/10/2022 0050   BILITOT 1.5 (H) 07/10/2022 0050   BILITOT 0.7 05/12/2022 1510   GFRNONAA >60 07/10/2022 0050   GFRNONAA >60 05/12/2022 1510   GFRAA  01/18/2022 0405    UNSATISFACTORY SPECIMEN. CLIENT REQUESTS SPECIMEN TO BE ANALYZED.   Lab Results  Component Value Date   WBC 17.3 (H) 07/10/2022   NEUTROABS 14.3 (H) 07/10/2022   HGB 13.0 07/10/2022   HCT 39.6 07/10/2022   MCV 88.4 07/10/2022   PLT 223 07/10/2022    Imaging:  [facility] Clinician Interpretation: I have personally reviewed the CNS images as listed.  My interpretation, in the  context of  the patient's clinical presentation, is stable disease  MR BRAIN W WO CONTRAST  Result Date: 07/18/2022 CLINICAL DATA:  Pleomorphic is inter astrocytoma follow-up EXAM: MRI HEAD WITHOUT AND WITH CONTRAST TECHNIQUE: Multiplanar, multiecho pulse sequences of the brain and surrounding structures were obtained without and with intravenous contrast. CONTRAST:  7mL GADAVIST GADOBUTROL 1 MMOL/ML IV SOLN COMPARISON:  05/07/2022 FINDINGS: Brain: Necrotic enhancing mass centered at the posterior right temporal lobe is unchanged in shape and size with irregularly marginated cavity superficially beneath the craniotomy flap. The more solid enhancing nodule especially along the anterior aspect of the cavity measuring up to 3 cm in transverse span is unchanged with persisting diffusion hyperintensity. Mass effect on the right temporal horn is also stable. T2 hyperintensity is extensive in the right cerebral hemisphere, spanning from vertex to temporal tip. There is some increase in T2 signal with gyral swelling at the inferior temporal lobe. No superimposed infarct or progressive mass effect. No collection or hydrocephalus. Vascular: Major flow voids and vascular enhancements are preserved Skull and upper cervical spine: Curved unremarkable craniotomy site Sinuses/Orbits: Chronic left maxillary sinusitis with atelectasis and opacification. Paranasal sinus inflammation elsewhere is improved. IMPRESSION: 1. Stable extent of necrotic mass in the right temporal lobe. Mild increase in T2 signal in the right cerebral hemisphere, especially at the temporal lobe where there is greater gyral swelling but no without worrisome mass effect. 2. Improved sinusitis. Electronically Signed   By: Tiburcio Pea M.D.   On: 07/18/2022 09:52   CT ABDOMEN PELVIS W CONTRAST  Result Date: 07/10/2022 CLINICAL DATA:  Abdominal pain. EXAM: CT ABDOMEN AND PELVIS WITH CONTRAST TECHNIQUE: Multidetector CT imaging of the abdomen and pelvis  was performed using the standard protocol following bolus administration of intravenous contrast. RADIATION DOSE REDUCTION: This exam was performed according to the departmental dose-optimization program which includes automated exposure control, adjustment of the mA and/or kV according to patient size and/or use of iterative reconstruction technique. CONTRAST:  OMNIPAQUE IOHEXOL 300 MG/ML  SOLN COMPARISON:  December 01, 2004 FINDINGS: Lower chest: No acute abnormality. Hepatobiliary: Stable hepatic cysts are noted. An ill-defined area of focal fatty infiltration is seen within the right lobe of the liver, anterior to the gallbladder fossa. Tiny gallstones are seen within the dependent portion of a mildly distended gallbladder. There is no evidence of gallbladder wall thickening, pericholecystic inflammation or biliary dilatation. Pancreas: Unremarkable. No pancreatic ductal dilatation or surrounding inflammatory changes. Spleen: Normal in size without focal abnormality. Adrenals/Urinary Tract: Adrenal glands are unremarkable. Kidneys are normal, without renal calculi, focal lesion, or hydronephrosis. Bladder is unremarkable. Stomach/Bowel: Stomach is within normal limits. Appendix appears normal. No evidence of bowel dilatation. Moderate severity diffuse colitis is seen throughout all aspects of the large bowel. Vascular/Lymphatic: No significant vascular findings are present. No enlarged abdominal or pelvic lymph nodes. Reproductive: A 2.9 cm diameter partially calcified heterogeneous uterine fibroid is seen along the anterior aspect of the uterine fundus on the right. The bilateral adnexa are unremarkable. Other: No abdominal wall hernia or abnormality. No abdominopelvic ascites. Musculoskeletal: A chronic compression fracture deformity is seen involving the L1 vertebral body. IMPRESSION: 1. Moderate severity diffuse colitis. 2. Cholelithiasis. 3. Stable hepatic cysts. 4. Calcified uterine fibroid. 5.  Chronic compression fracture deformity involving the L1 vertebral body. Electronically Signed   By: Aram Candela M.D.   On: 07/10/2022 02:32    Assessment/Plan Pleomorphic xanthoastrocytoma [facility])  Seizure disorder (HCC)  Rosanne Barkus is clinically stable today.  MRI  brain  demonstrates stable findings, with mild increase in FLAIR signal attributable to cessation of corticosteroids.  We recommended to continue Keppra 1000mg  BID as prior.    She will continue to work aggressively with PT as discussed.  We ask that Ciniya Lardie return to clinic in 4 months with MRI brain for evaluation, or sooner if needed.  All questions were answered. The patient knows to call the clinic with any problems, questions or concerns. No barriers to learning were detected.  The total time spent in the encounter was 30 minutes and more than 50% was on counseling and review of test results   Henreitta Leber, MD Medical Director of Neuro-Oncology [facility] 07/21/22 11:21 AM

## 2022-08-20 ENCOUNTER — Other Ambulatory Visit: Payer: Self-pay

## 2022-08-20 ENCOUNTER — Emergency Department (HOSPITAL_COMMUNITY): Payer: BC Managed Care – PPO

## 2022-08-20 ENCOUNTER — Emergency Department (HOSPITAL_COMMUNITY)
Admission: EM | Admit: 2022-08-20 | Discharge: 2022-08-21 | Disposition: A | Discharge status: Home / Self Care | Payer: BC Managed Care – PPO | Attending: Emergency Medicine | Admitting: Emergency Medicine

## 2022-08-20 DIAGNOSIS — R001 Bradycardia, unspecified: Secondary | ICD-10-CM | POA: Insufficient documentation

## 2022-08-20 DIAGNOSIS — R1013 Epigastric pain: Secondary | ICD-10-CM | POA: Diagnosis present

## 2022-08-20 DIAGNOSIS — R110 Nausea: Secondary | ICD-10-CM | POA: Diagnosis not present

## 2022-08-20 DIAGNOSIS — I10 Essential (primary) hypertension: Secondary | ICD-10-CM | POA: Insufficient documentation

## 2022-08-20 LAB — COMPREHENSIVE METABOLIC PANEL
ALT: 41 U/L (ref 0–44)
AST: 104 U/L — ABNORMAL HIGH (ref 15–41)
Albumin: 3.2 g/dL — ABNORMAL LOW (ref 3.5–5.0)
Alkaline Phosphatase: 84 U/L (ref 38–126)
Anion gap: 9 (ref 5–15)
BUN: 7 mg/dL — ABNORMAL LOW (ref 8–23)
CO2: 23 mmol/L (ref 22–32)
Calcium: 8.6 mg/dL — ABNORMAL LOW (ref 8.9–10.3)
Chloride: 104 mmol/L (ref 98–111)
Creatinine, Ser: 0.64 mg/dL (ref 0.44–1.00)
GFR, Estimated: >60 mL/min (ref >60)
Glucose, Bld: 113 mg/dL — ABNORMAL HIGH (ref 70–99)
Potassium: 3.1 mmol/L — ABNORMAL LOW (ref 3.5–5.1)
Sodium: 136 mmol/L (ref 135–145)
Total Bilirubin: 0.8 mg/dL (ref 0.3–1.2)
Total Protein: 5.9 g/dL — ABNORMAL LOW (ref 6.5–8.1)

## 2022-08-20 LAB — I-STAT VENOUS BLOOD GAS, ED
Acid-Base Excess: 0 mmol/L (ref 0.0–2.0)
Bicarbonate: 24.7 mmol/L (ref 20.0–28.0)
Calcium, Ion: 1.12 mmol/L — ABNORMAL LOW (ref 1.15–1.40)
HCT: 35 % — ABNORMAL LOW (ref 36.0–46.0)
Hemoglobin: 11.9 g/dL — ABNORMAL LOW (ref 12.0–15.0)
O2 Saturation: 52 %
Potassium: 3.2 mmol/L — ABNORMAL LOW (ref 3.5–5.1)
Sodium: 137 mmol/L (ref 135–145)
TCO2: 26 mmol/L (ref 22–32)
pCO2, Ven: 41.9 mmHg — ABNORMAL LOW (ref 44–60)
pH, Ven: 7.379 (ref 7.25–7.43)
pO2, Ven: 28 mmHg — CL (ref 32–45)

## 2022-08-20 LAB — CBC
HCT: 36.4 % (ref 36.0–46.0)
Hemoglobin: 11.6 g/dL — ABNORMAL LOW (ref 12.0–15.0)
MCH: 27.4 pg (ref 26.0–34.0)
MCHC: 31.9 g/dL (ref 30.0–36.0)
MCV: 85.8 fL (ref 80.0–100.0)
Platelets: 208 10*3/uL (ref 150–400)
RBC: 4.24 MIL/uL (ref 3.87–5.11)
RDW: 15.2 % (ref 11.5–15.5)
WBC: 6.4 10*3/uL (ref 4.0–10.5)
nRBC: 0 % (ref 0.0–0.2)

## 2022-08-20 LAB — I-STAT CHEM 8, ED
BUN: 8 mg/dL (ref 8–23)
Calcium, Ion: 1.16 mmol/L (ref 1.15–1.40)
Chloride: 102 mmol/L (ref 98–111)
Creatinine, Ser: 0.5 mg/dL (ref 0.44–1.00)
Glucose, Bld: 110 mg/dL — ABNORMAL HIGH (ref 70–99)
HCT: 36 % (ref 36.0–46.0)
Hemoglobin: 12.2 g/dL (ref 12.0–15.0)
Potassium: 3.2 mmol/L — ABNORMAL LOW (ref 3.5–5.1)
Sodium: 138 mmol/L (ref 135–145)
TCO2: 24 mmol/L (ref 22–32)

## 2022-08-20 LAB — PROTIME-INR
INR: 1.3 — ABNORMAL HIGH (ref 0.8–1.2)
Prothrombin Time: 16.3 seconds — ABNORMAL HIGH (ref 11.4–15.2)

## 2022-08-20 LAB — LIPASE, BLOOD: Lipase: 78 U/L — ABNORMAL HIGH (ref 11–51)

## 2022-08-20 LAB — TROPONIN I (HIGH SENSITIVITY): Troponin I (High Sensitivity): 5 ng/L (ref <18)

## 2022-08-20 LAB — MAGNESIUM: Magnesium: 1.5 mg/dL — ABNORMAL LOW (ref 1.7–2.4)

## 2022-08-20 MED ORDER — SODIUM CHLORIDE 0.9 % IV BOLUS
1000.0000 mL | Freq: Once | INTRAVENOUS | Status: AC
  Administered 2022-08-20: 1000 mL via INTRAVENOUS

## 2022-08-20 NOTE — ED Triage Notes (Signed)
Patient arrived with EMS from [facility] staff reported emesis x2 this evening , HR= 42 / BP= 72/42 by EMS prior to arrival , initiated pacing by EMS at scene discontinued by EDP at arrival . CBG=127. DNR .

## 2022-08-20 NOTE — ED Provider Notes (Signed)
[facility]EM[facility] DEPT [facility] Emergency Department Provider Note MRN:  [id number]  Arrival date & time: 08/21/22     Chief Complaint   [name] ( Pacing by EMS)    History of Present Illness   Lindsey Daniels is a 67 y.o. year-old female with a history of hypertension, seizure disorder, brain tumor presenting to the ED with chief complaint of hypotensive.  Patient has been having epigastric discomfort with nausea for the past 2 or 3 days.  Was evaluated by staff at her facility today and was found to be pale, less responsive, looked very sick.  EMS was called.  Found her to be hypotensive and bradycardic.  Being paced on the way to the emergency department.  Review of Systems  A thorough review of systems was obtained and all systems are negative except as noted in the HPI and PMH.   Patient's Health History    Past Medical History:  Diagnosis Date   Allergy    Anal fissure    Anxiety    Cataract    Depression    Elevated LFTs    Hyperglycemia    Hyperlipidemia    Hypertension    Osteoarthritis    Pars defect of lumbar spine    Seizures (HCC)    Spondylolisthesis    lumbar    Past Surgical History:  Procedure Laterality Date   cataract     CRANIOTOMY N/A 10/02/2013   Procedure: 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;  Surgeon: Hewitt Shorts, MD;  Location: [facility];  Service: Neurosurgery;  Laterality: N/A;  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   CRANIOTOMY Right 11/29/2020   Procedure: Right Parietal craniotomy for tumor resection;  Surgeon: Coletta Memos, MD;  Location: [facility] OR;  Service: Neurosurgery;  Laterality: Right;   HEEL SPUR SURGERY      Family History  Problem Relation Age of Onset   Mental illness Father        depression   COPD Father    Alcohol abuse Brother    Breast cancer Neg Hx     Social History   Socioeconomic History   Marital status: Divorced    Spouse name: separated-no papers file   Number of  children: 0   Years of education: Not on file   Highest education level: Not on file  Occupational History   Occupation: RT    Employer: [employer]    Comment: [employer] and [employer]  Tobacco Use   Smoking status: Never   Smokeless tobacco: Never  Substance and Sexual Activity   Alcohol use: Yes    Alcohol/week: 0.0 - 4.0 standard drinks of alcohol    Comment: social   Drug use: No   Sexual activity: Not Currently    Partners: Male  Other Topics Concern   Not on file  Social History Narrative   Separated from second husband.  Very contentious split.   Social Determinants of Health   Financial Resource Strain: Low Risk  (01/15/2022)   Overall Financial Resource Strain (CARDIA)    Difficulty of Paying Living Expenses: Not hard at all  Food Insecurity: No Food Insecurity (01/15/2022)   Hunger Vital Sign    Worried About Running Out of Food in the Last Year: Never true    Ran Out of Food in the Last Year: Never true  Transportation Needs: No Transportation Needs (01/15/2022)   PRAPARE - Administrator, Civil Service (  Medical): No    Lack of Transportation (Non-Medical): No  Physical Activity: Not on file  Stress: Not on file  Social Connections: Not on file  Intimate Partner Violence: Not At Risk (02/06/2021)   Humiliation, Afraid, Rape, and Kick questionnaire    Fear of Current or Ex-Partner: No    Emotionally Abused: No    Physically Abused: No    Sexually Abused: No     Physical Exam   Vitals:   08/21/22 0445 08/21/22 0500  BP: 100/66 115/72  Pulse: 63 69  Resp: 10 17  Temp:    SpO2: 92% 96%    CONSTITUTIONAL: Well-appearing, NAD NEURO/PSYCH:  Alert and oriented x 3, no focal deficits EYES:  eyes equal and reactive ENT/NECK:  no LAD, no JVD CARDIO: Regular rate, well-perfused, normal S1 and S2 PULM:  CTAB no wheezing or rhonchi GI/GU:  non-distended, non-tender MSK/SPINE:  No gross deformities, no  edema SKIN:  no rash, atraumatic   *Additional and/or pertinent findings included in MDM below  Diagnostic and Interventional Summary    EKG Interpretation Date/Time:  Thursday August 20 2022 23:15:10 EDT Ventricular Rate:  81 PR Interval:  167 QRS Duration:  142 QT Interval:  465 QTC Calculation: 540 R Axis:   16  Text Interpretation: Sinus rhythm Left bundle branch block Confirmed by Kennis Carina [phone]) on 08/20/2022 11:24:56 PM       Labs Reviewed  CBC - Abnormal; Notable for the following components:      Result Value   Hemoglobin 11.6 (*)    All other components within normal limits  COMPREHENSIVE METABOLIC PANEL - Abnormal; Notable for the following components:   Potassium 3.1 (*)    Glucose, Bld 113 (*)    BUN 7 (*)    Calcium 8.6 (*)    Total Protein 5.9 (*)    Albumin 3.2 (*)    AST 104 (*)    All other components within normal limits  LIPASE, BLOOD - Abnormal; Notable for the following components:   Lipase 78 (*)    All other components within normal limits  PROTIME-INR - Abnormal; Notable for the following components:   Prothrombin Time 16.3 (*)    INR 1.3 (*)    All other components within normal limits  LACTIC ACID, PLASMA - Abnormal; Notable for the following components:   Lactic Acid, Venous 2.3 (*)    All other components within normal limits  MAGNESIUM - Abnormal; Notable for the following components:   Magnesium 1.5 (*)    All other components within normal limits  I-STAT VENOUS BLOOD GAS, ED - Abnormal; Notable for the following components:   pCO2, Ven 41.9 (*)    pO2, Ven 28 (*)    Potassium 3.2 (*)    Calcium, Ion 1.12 (*)    HCT 35.0 (*)    Hemoglobin 11.9 (*)    All other components within normal limits  I-STAT CHEM 8, ED - Abnormal; Notable for the following components:   Potassium 3.2 (*)    Glucose, Bld 110 (*)    All other components within normal limits  URINALYSIS, ROUTINE W REFLEX MICROSCOPIC  TROPONIN I (HIGH SENSITIVITY)   TROPONIN I (HIGH SENSITIVITY)    CT Angio Chest Pulmonary Embolism (PE) W or WO Contrast  Final Result    CT ABDOMEN PELVIS W CONTRAST  Final Result    DG Chest Port 1 View  Final Result      Medications  magnesium oxide (MAG-OX) tablet 800  mg (has no administration in time range)  sodium chloride 0.9 % bolus 1,000 mL (0 mLs Intravenous Stopped 08/21/22 0026)  iohexol (OMNIPAQUE) 350 MG/ML injection 75 mL (75 mLs Intravenous Contrast Given 08/21/22 0058)     Procedures  /  Critical Care .Critical Care  Performed by: Sabas Sous, MD Authorized by: Sabas Sous, MD   Critical care provider statement:    Critical care time (minutes):  32   Critical care was necessary to treat or prevent imminent or life-threatening deterioration of the following conditions: Bradycardia requiring cardiac pacing.   Critical care was time spent personally by me on the following activities:  Development of treatment plan with patient or surrogate, discussions with consultants, evaluation of patient's response to treatment, examination of patient, ordering and review of laboratory studies, ordering and review of radiographic studies, ordering and performing treatments and interventions, pulse oximetry, re-evaluation of patient's condition and review of old charts   ED Course and Medical Decision Making  Initial Impression and Ddx On arrival patient is being paced.  She is awake and alert and fully oriented, tolerating the pacing surprisingly well.  During transition to the ED trauma bay bed, pacing was paused and she seemed to have a strong peripheral pulse with heart rate in the 70s to 80s, still seem to be doing well and so able to discontinue pacing on arrival.  Past medical/surgical history that increases complexity of ED encounter: Brain continue  Interpretation of Diagnostics I personally reviewed the EKG and my interpretation is as follows: Sinus rhythm, bundle branch block  Labs reassuring  with no significant blood count or electrolyte disturbance, lactate minimally elevated prior to fluids.  Hypomagnesemia noted, mild lipase elevation of unclear significance.  Patient Reassessment and Ultimate Disposition/Management     Patient was observed in the emergency department for 7 hours with no return of bradycardia, no return of symptoms.  She slept peacefully.  CT imaging without PE, no emergent intra-abdominal process.  Suspect a vasovagal episode brought on by indigestion or acid reflux.  Exam continues to be reassuring, no abdominal tenderness, troponin negative x 2, no indication for further testing or admission.  Appropriate for discharge.  Patient management required discussion with the following services or consulting groups:  None  Complexity of Problems Addressed Acute illness or injury that poses threat of life of bodily function  Additional Data Reviewed and Analyzed Further history obtained from: EMS on arrival  Additional Factors Impacting ED Encounter Risk Consideration of hospitalization  Elmer Sow. Pilar Plate, MD [facility] Emergency Medicine [facility] [email or web address]  Final Clinical Impressions(s) / ED Diagnoses     ICD-10-CM   1. Epigastric pain  R10.13     2. Bradycardia  R00.1       ED Discharge Orders     None        Discharge Instructions Discussed with and Provided to Patient:     Discharge Instructions      You were evaluated in the Emergency Department and after careful evaluation, we did not find any emergent condition requiring admission or further testing in the hospital.  Your exam/testing today is overall reassuring.  Symptoms may have been due to indigestion or acid reflux related pain followed by a vasovagal episode, which would explain your episode of low heart rate.  Recommend taking her daily Prilosec as we discussed, follow-up with your regular doctors.  Please return to the Emergency Department if you  experience any worsening of your condition.  Thank you for allowing [place] to be a part of your care.       Sabas Sous, MD 08/21/22 [phone]

## 2022-08-21 ENCOUNTER — Emergency Department (HOSPITAL_COMMUNITY): Payer: BC Managed Care – PPO

## 2022-08-21 LAB — URINALYSIS, ROUTINE W REFLEX MICROSCOPIC
Bilirubin Urine: NEGATIVE
Glucose, UA: NEGATIVE mg/dL
Hgb urine dipstick: NEGATIVE
Ketones, ur: NEGATIVE mg/dL
Leukocytes,Ua: NEGATIVE
Nitrite: NEGATIVE
Protein, ur: NEGATIVE mg/dL
Specific Gravity, Urine: 1.01 (ref 1.005–1.030)
pH: 6.5 (ref 5.0–8.0)

## 2022-08-21 LAB — TROPONIN I (HIGH SENSITIVITY): Troponin I (High Sensitivity): 6 ng/L (ref <18)

## 2022-08-21 LAB — LACTIC ACID, PLASMA: Lactic Acid, Venous: 2.3 mmol/L (ref 0.5–1.9)

## 2022-08-21 MED ORDER — IOHEXOL 350 MG/ML SOLN
75.0000 mL | Freq: Once | INTRAVENOUS | Status: AC | PRN
  Administered 2022-08-21: 75 mL via INTRAVENOUS

## 2022-08-21 MED ORDER — MAGNESIUM OXIDE -MG SUPPLEMENT 400 (240 MG) MG PO TABS
800.0000 mg | ORAL_TABLET | Freq: Once | ORAL | Status: AC
  Administered 2022-08-21: 800 mg via ORAL
  Filled 2022-08-21: qty 2

## 2022-08-21 NOTE — Discharge Instructions (Signed)
You were evaluated in the Emergency Department and after careful evaluation, we did not find any emergent condition requiring admission or further testing in the hospital.  Your exam/testing today is overall reassuring.  Symptoms may have been due to indigestion or acid reflux related pain followed by a vasovagal episode, which would explain your episode of low heart rate.  Recommend taking her daily Prilosec as we discussed, follow-up with your regular doctors.  Please return to the Emergency Department if you experience any worsening of your condition.   Thank you for allowing [place] to be a part of your care.

## 2022-08-21 NOTE — ED Notes (Signed)
Report given to Limmie Patricia RN at [facility] ( SNF) on patient's discharge . PTAR notified by Diplomatic Services operational officer for transport .

## 2022-09-22 ENCOUNTER — Telehealth: Payer: Self-pay | Admitting: *Deleted

## 2022-09-22 NOTE — Telephone Encounter (Signed)
Disability for for July 2024 faxed to [facility], fax confirmation received.

## 2022-09-22 NOTE — Telephone Encounter (Signed)
Disability form for June 2024 faxed to [facility], fax confirmation received.

## 2022-10-26 ENCOUNTER — Telehealth: Payer: Self-pay | Admitting: *Deleted

## 2022-10-26 NOTE — Telephone Encounter (Signed)
error 

## 2022-10-26 NOTE — Telephone Encounter (Signed)
Received call from [facility] where patient is residing.  Call in bound from [employer]) calling as patient went to ER found to have increased edema on the brain and was instructed to have a follow up with provider.  Returned call to [phone] trying to obtain details as to which ER patient went to and where scan was done for a fall.   [facility].  Patient was placed on dexamethasone.

## 2022-10-26 NOTE — Telephone Encounter (Signed)
Maisie [employer] planner) will call back to schedule visit for patient.  In meantime will try to obtain MRI from [facility].  Pending call back.

## 2022-10-28 ENCOUNTER — Telehealth: Payer: Self-pay | Admitting: *Deleted

## 2022-10-28 ENCOUNTER — Inpatient Hospital Stay
Admission: RE | Admit: 2022-10-28 | Discharge: 2022-10-28 | Disposition: A | Discharge status: Home / Self Care | Payer: Self-pay | Source: Ambulatory Visit | Attending: Internal Medicine | Admitting: Internal Medicine

## 2022-10-28 ENCOUNTER — Other Ambulatory Visit: Payer: Self-pay | Admitting: *Deleted

## 2022-10-28 DIAGNOSIS — C719 Malignant neoplasm of brain, unspecified: Secondary | ICD-10-CM

## 2022-10-28 NOTE — Telephone Encounter (Signed)
-----   Message from Henreitta Leber sent at 10/28/2022  1:15 PM EDT ----- Regarding: RE: CT at [facility] If she's having new or progressive symptoms I can see her Thursday ----- Message ----- From: Arville Care, RN Sent: 10/28/2022  11:48 AM EDT To: Henreitta Leber, MD Subject: RE: CT at [facility]                             The CT images are in her chart now if you want to review.  Let me know if you want to see her on Thursday. ----- Message ----- From: Henreitta Leber, MD Sent: 10/28/2022   9:19 AM EDT To: Arville Care, RN Subject: RE: CT at [facility]                             Yes but we need the images.  They may be comparing the Ct to an out of date study at [place].  They won't have access to recent images here.  Could just be stable ----- Message ----- From: Arville Care, RN Sent: 10/28/2022   9:07 AM EDT To: Henreitta Leber, MD Subject: CT at [facility] morning, I got a call from [facility] yesterday afternoon about [name].  She went to the ED at [facility] over the weekend for a fall.  She had a head CT which the facility faxed to [place], (just the note) it showed "significant increased edema on the R side.  This is worrisome for recurrence of your tumor."  It advised F/U with you ASAP.  Looks like you have a slot open Thursday at 11:30.  Are you ok with her being added on?  [name]

## 2022-10-28 NOTE — Telephone Encounter (Signed)
PC to [facility] regarding message below.  Informed him Dr Barbaraann Cao has reviewed CT images from 10/24/22.  Maisie Fus states that patient does not have any new or worsening symptoms.  Informed Maisie Fus that if patient develops any progressive symptoms to contact this office, otherwise Dr Barbaraann Cao will see her on 11/24/22 as scheduled.  He verbalizes understanding.

## 2022-11-19 ENCOUNTER — Ambulatory Visit (HOSPITAL_COMMUNITY): Admission: RE | Admit: 2022-11-19 | Payer: BC Managed Care – PPO | Source: Ambulatory Visit

## 2022-11-23 ENCOUNTER — Other Ambulatory Visit: Payer: Self-pay | Admitting: *Deleted

## 2022-11-24 ENCOUNTER — Inpatient Hospital Stay: Payer: BC Managed Care – PPO | Admitting: Internal Medicine

## 2022-11-24 ENCOUNTER — Telehealth: Payer: Self-pay | Admitting: Internal Medicine

## 2022-11-24 NOTE — Telephone Encounter (Signed)
Called and spoke with staff at patient's assisted living facility. Set up an appointment with transportation after MRI. Patient will be notified of appointment.

## 2022-11-25 ENCOUNTER — Other Ambulatory Visit (HOSPITAL_COMMUNITY): Payer: BC Managed Care – PPO

## 2022-11-30 ENCOUNTER — Other Ambulatory Visit: Payer: Self-pay | Admitting: Internal Medicine

## 2022-11-30 DIAGNOSIS — C719 Malignant neoplasm of brain, unspecified: Secondary | ICD-10-CM

## 2022-12-02 ENCOUNTER — Ambulatory Visit (HOSPITAL_COMMUNITY): Payer: BC Managed Care – PPO

## 2022-12-02 ENCOUNTER — Other Ambulatory Visit: Payer: Self-pay

## 2022-12-02 ENCOUNTER — Encounter (HOSPITAL_COMMUNITY): Payer: Self-pay

## 2022-12-02 ENCOUNTER — Emergency Department (HOSPITAL_COMMUNITY): Payer: BC Managed Care – PPO

## 2022-12-02 ENCOUNTER — Telehealth: Payer: Self-pay | Admitting: *Deleted

## 2022-12-02 ENCOUNTER — Emergency Department (HOSPITAL_COMMUNITY)
Admission: EM | Admit: 2022-12-02 | Discharge: 2022-12-02 | Disposition: A | Discharge status: Home / Self Care | Payer: BC Managed Care – PPO | Attending: Emergency Medicine | Admitting: Emergency Medicine

## 2022-12-02 DIAGNOSIS — I10 Essential (primary) hypertension: Secondary | ICD-10-CM | POA: Diagnosis not present

## 2022-12-02 DIAGNOSIS — Z79899 Other long term (current) drug therapy: Secondary | ICD-10-CM | POA: Insufficient documentation

## 2022-12-02 DIAGNOSIS — R569 Unspecified convulsions: Secondary | ICD-10-CM | POA: Insufficient documentation

## 2022-12-02 DIAGNOSIS — Z7901 Long term (current) use of anticoagulants: Secondary | ICD-10-CM | POA: Insufficient documentation

## 2022-12-02 LAB — I-STAT CHEM 8, ED
BUN: 8 mg/dL (ref 8–23)
Calcium, Ion: 1.13 mmol/L — ABNORMAL LOW (ref 1.15–1.40)
Chloride: 103 mmol/L (ref 98–111)
Creatinine, Ser: 0.5 mg/dL (ref 0.44–1.00)
Glucose, Bld: 97 mg/dL (ref 70–99)
HCT: 39 % (ref 36.0–46.0)
Hemoglobin: 13.3 g/dL (ref 12.0–15.0)
Potassium: 3.9 mmol/L (ref 3.5–5.1)
Sodium: 139 mmol/L (ref 135–145)
TCO2: 24 mmol/L (ref 22–32)

## 2022-12-02 LAB — COMPREHENSIVE METABOLIC PANEL
ALT: 17 U/L (ref 0–44)
AST: 39 U/L (ref 15–41)
Albumin: 3.4 g/dL — ABNORMAL LOW (ref 3.5–5.0)
Alkaline Phosphatase: 104 U/L (ref 38–126)
Anion gap: 13 (ref 5–15)
BUN: 8 mg/dL (ref 8–23)
CO2: 23 mmol/L (ref 22–32)
Calcium: 9.6 mg/dL (ref 8.9–10.3)
Chloride: 104 mmol/L (ref 98–111)
Creatinine, Ser: 0.6 mg/dL (ref 0.44–1.00)
GFR, Estimated: >60 mL/min (ref >60)
Glucose, Bld: 100 mg/dL — ABNORMAL HIGH (ref 70–99)
Potassium: 4.6 mmol/L (ref 3.5–5.1)
Sodium: 140 mmol/L (ref 135–145)
Total Bilirubin: 0.7 mg/dL (ref 0.3–1.2)
Total Protein: 6.4 g/dL — ABNORMAL LOW (ref 6.5–8.1)

## 2022-12-02 LAB — CBC WITH DIFFERENTIAL/PLATELET
Abs Immature Granulocytes: 0.07 10*3/uL (ref 0.00–0.07)
Basophils Absolute: 0 10*3/uL (ref 0.0–0.1)
Basophils Relative: 0 %
Eosinophils Absolute: 0.2 10*3/uL (ref 0.0–0.5)
Eosinophils Relative: 2 %
HCT: 38.7 % (ref 36.0–46.0)
Hemoglobin: 13.1 g/dL (ref 12.0–15.0)
Immature Granulocytes: 1 %
Lymphocytes Relative: 22 %
Lymphs Abs: 1.7 10*3/uL (ref 0.7–4.0)
MCH: 29.2 pg (ref 26.0–34.0)
MCHC: 33.9 g/dL (ref 30.0–36.0)
MCV: 86.4 fL (ref 80.0–100.0)
Monocytes Absolute: 0.7 10*3/uL (ref 0.1–1.0)
Monocytes Relative: 9 %
Neutro Abs: 5.1 10*3/uL (ref 1.7–7.7)
Neutrophils Relative %: 66 %
Platelets: 360 10*3/uL (ref 150–400)
RBC: 4.48 MIL/uL (ref 3.87–5.11)
RDW: 13.3 % (ref 11.5–15.5)
WBC: 7.6 10*3/uL (ref 4.0–10.5)
nRBC: 0 % (ref 0.0–0.2)

## 2022-12-02 LAB — PHOSPHORUS: Phosphorus: 4.5 mg/dL (ref 2.5–4.6)

## 2022-12-02 LAB — MAGNESIUM: Magnesium: 1.9 mg/dL (ref 1.7–2.4)

## 2022-12-02 MED ORDER — DEXAMETHASONE 4 MG PO TABS: 4.0000 mg | ORAL_TABLET | Freq: Two times a day (BID) | ORAL | 0 refills | Status: DC

## 2022-12-02 MED ORDER — DEXAMETHASONE SODIUM PHOSPHATE 10 MG/ML IJ SOLN
10.0000 mg | Freq: Once | INTRAMUSCULAR | Status: AC
  Administered 2022-12-02: 10 mg via INTRAVENOUS
  Filled 2022-12-02: qty 1

## 2022-12-02 MED ORDER — GADOBUTROL 1 MMOL/ML IV SOLN
6.0000 mL | Freq: Once | INTRAVENOUS | Status: AC | PRN
  Administered 2022-12-02: 6 mL via INTRAVENOUS

## 2022-12-02 MED ORDER — LORAZEPAM 2 MG/ML IJ SOLN
INTRAMUSCULAR | Status: AC
  Administered 2022-12-02: 2 mg
  Filled 2022-12-02: qty 1

## 2022-12-02 MED ORDER — LEVETIRACETAM 500 MG PO TABS
1500.0000 mg | ORAL_TABLET | Freq: Once | ORAL | Status: AC
  Administered 2022-12-02: 1500 mg via ORAL
  Filled 2022-12-02: qty 3

## 2022-12-02 MED ORDER — LEVETIRACETAM 750 MG PO TABS: 1500.0000 mg | ORAL_TABLET | Freq: Two times a day (BID) | ORAL | 1 refills | Status: DC

## 2022-12-02 NOTE — ED Provider Notes (Signed)
[place] EMERGENCY DEPARTMENT AT [facility] Provider Note   CSN: [id number] Arrival date & time: 12/02/22  1217     History  Chief Complaint  Patient presents with   Seizures    Lindsey Daniels is a 67 y.o. 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 Has h/o similar in the past seen in the ED. She has spasms of left face, left hemibody. Been going on for approx 2 hours. Patient takes keppra 1000 mg BID. Bedside MAR from SNF reports she got it this AM but EMS reports that someone at SNF told her they have been trying to wean her off her Keppra. Has L-sided weakness at baseline. Reports no recent illnesses, and was in her [facility] when she woke up this AM. Follows w/ Dr. Barbaraann Cao, most recent note states "MRI brain demonstrates stable findings, with mild increase in FLAIR signal attributable to cessation of corticosteroids. Recommend continue Keppra 1000mg  BID as prior, continue to work with PT, return in 4 months with brain MRI." Was supposed to go for brain MRI today for monitoring of brain tumor.   Past Medical History:  Diagnosis Date   Allergy    Anal fissure    Anxiety    Cataract    Depression    Elevated LFTs    Hyperglycemia    Hyperlipidemia    Hypertension    Osteoarthritis    Pars defect of lumbar spine    Seizures (HCC)    Spondylolisthesis    lumbar       Home Medications Prior to Admission medications   Medication Sig Start Date End Date Taking? Authorizing Provider  amLODipine (NORVASC) 10 MG tablet Take 1 tablet (10 mg total) by mouth daily. 10/06/21  Yes Meredeth Ide, MD  apixaban (ELIQUIS) 5 MG TABS tablet Take 1 tablet (5 mg total) by mouth 2 (two) times daily. 11/18/21  Yes Vaslow, Georgeanna Lea, MD  atorvastatin (LIPITOR) 20 MG tablet Take 1 tablet (20 mg total) by mouth daily. 07/25/17  Yes Jeffery, Avelino Leeds,  PA  cetirizine (ZYRTEC) 10 MG tablet Take 10 mg by mouth daily.   Yes [provider]  cholecalciferol (VITAMIN D) 1000 units tablet Take 1,000 Units by mouth daily.   Yes [provider]  fluticasone (FLONASE) 50 MCG/ACT nasal spray Place 1-2 sprays into both nostrils daily. Patient taking differently: Place 2 sprays into both nostrils daily. 11/06/20  Yes Wieters, Hallie C, PA-C  levETIRAcetam (KEPPRA) 500 MG tablet Take 1,000 mg by mouth 2 (two) times daily. 11/17/22  Yes [provider]  losartan (COZAAR) 50 MG tablet Take 1 tablet (50 mg total) by mouth daily. 10/06/21  Yes Lama, Sarina Ill, MD  melatonin 5 MG TABS Take 5 mg by mouth at bedtime.   Yes [provider]  Multiple Vitamin (MULTIVITAMIN WITH MINERALS) TABS tablet Take 1 tablet by mouth daily.   Yes [provider]  omeprazole (PRILOSEC) 20 MG capsule Take 20 mg by mouth daily.   Yes [provider]  sertraline (ZOLOFT) 100 MG tablet Take 1 tablet (100 mg total) by mouth daily. 06/30/17  Yes Jeffery, Chelle, PA  sertraline (ZOLOFT) 50 MG tablet Take 50 mg by mouth daily.   Yes [provider]      Allergies    Rifapentine, Amoxicillin, and Clavulanic acid    Review of Systems  Review of Systems A 10 point review of systems was performed and is negative unless otherwise reported in HPI.  Physical Exam Updated Vital Signs BP 117/65   Pulse 88   Temp (!) 97.5 F (36.4 C) (Oral)   Resp (!) 22   Ht 5\' 3"  (1.6 m)   Wt 62.2 kg   SpO2 95%   BMI 24.29 kg/m  Physical Exam General: Normal appearing female, lying in bed.  HEENT: PERRLA, EOMI, Sclera anicteric, MMM, trachea midline. Tongue protrudes midline. Cardiology: RRR, no murmurs/rubs/gallops.  Resp: Normal respiratory rate and effort. CTAB, no wheezes, rhonchi, crackles.  Abd: Soft, non-tender, non-distended. No rebound tenderness or guarding.  GU: Deferred. MSK: No peripheral edema or signs of trauma.  Skin:  warm, dry.  Neuro: A&Ox3, CNs II-XII grossly intact. Intermittent twitching q1sec of left side of face, left upper extremity, left hemithorax. LLE relatively quiet. L sided weakness. Sensation grossly intact.  Psych: Normal mood and affect.   ED Results / Procedures / Treatments   Labs (all labs ordered are listed, but only abnormal results are displayed) Labs Reviewed  COMPREHENSIVE METABOLIC PANEL - Abnormal; Notable for the following components:      Result Value   Glucose, Bld 100 (*)    Total Protein 6.4 (*)    Albumin 3.4 (*)    All other components within normal limits  I-STAT CHEM 8, ED - Abnormal; Notable for the following components:   Calcium, Ion 1.13 (*)    All other components within normal limits  CBC WITH DIFFERENTIAL/PLATELET  MAGNESIUM  PHOSPHORUS  URINALYSIS, ROUTINE W REFLEX MICROSCOPIC  RAPID URINE DRUG SCREEN, HOSP PERFORMED  LEVETIRACETAM LEVEL  CBG MONITORING, ED    EKG EKG Interpretation Date/Time:  Wednesday December 02 2022 12:30:07 EDT Ventricular Rate:  104 PR Interval:  134 QRS Duration:  127 QT Interval:  376 QTC Calculation: 495 R Axis:   -46  Text Interpretation: Sinus tachycardia Left bundle branch block Confirmed by Vivi Barrack [phone]) on 12/02/2022 12:32:10 PM  Radiology No results found.  Procedures Procedures    Medications Ordered in ED Medications  LORazepam (ATIVAN) 2 MG/ML injection (2 mg  Given 12/02/22 1235)  gadobutrol (GADAVIST) 1 MMOL/ML injection 6 mL (6 mLs Intravenous Contrast Given 12/02/22 1617)    ED Course/ Medical Decision Making/ A&P                          Medical Decision Making Amount and/or Complexity of Data Reviewed Labs: ordered. Decision-making details documented in ED Course. Radiology: ordered.  Risk Prescription drug management.    This patient presents to the ED for concern of focal seizures, this involves an extensive number of treatment options, and is a complaint that carries with  it a high risk of complications and morbidity.  I considered the following differential and admission for this acute, potentially life threatening condition.   MDM:    Patient with history of focal seizures consistent with those demonstrated on exam today in the setting of her known brain mass.  Patient has been seizing for approximately 2 hours now with no abortive medications given.  Will give Ativan here IV.  She is controlling her airway and awake and alert.  Some concern about her Keppra being given, however the MAR is at bedside and it is noted that she got her Keppra this morning.  Will get a level.  Her labs do not demonstrate any signs of hypomagnesemia/hypophosphatemia, hyponatremia.  Will discuss with neuro-oncology.  Clinical Course as of 12/02/22 1636  Wed Dec 02, 2022  1230 2 mg IV ativan given and patient stopped seizing. Feels improved and sleepy now but is easily responsive to voice. [HN]  1335 CBC with Differential/Platelet wnl [HN]  1335 Consulted to neuro-oncology [HN]  1351 Mg/Phos, CMP unremarkable. D/w Dr. Barbaraann Cao who in light of her presentation today recommends emergently obtaining MRI brain w/w/o contrast.  [HN]  1611 Malignant brain mass, L sided focal seizures, 2 hours today required Ativan, getting MRI, follows with Dr. Barbaraann Cao, follow up MRI, talk with neuro/neuro-onc [JD]    Clinical Course User Index [HN] Loetta Rough, MD [JD] Laurence Spates, MD    Labs: I Ordered, and personally interpreted labs.  The pertinent results include:  those listed above  Imaging Studies ordered: I ordered imaging studies including MRI brain with and without contrast I independently visualized and interpreted imaging. I agree with the radiologist interpretation  Additional history obtained from chart review.    Cardiac Monitoring: The patient was maintained on a cardiac monitor.  I personally viewed and interpreted the cardiac monitored which showed an underlying rhythm  of: sinus tachycardia  Reevaluation: After the interventions noted above, I reevaluated the patient and found that they have :improved  Social Determinants of Health: Lives at SNF  Disposition: Signed out to oncoming emergency medicine physician Dr. Earlene Plater who is made aware of her presentation and plan.  Will get MRI brain with and without and likely discuss again with neuro-oncology versus neurology.  Co morbidities that complicate the patient evaluation  Past Medical History:  Diagnosis Date   Allergy    Anal fissure    Anxiety    Cataract    Depression    Elevated LFTs    Hyperglycemia    Hyperlipidemia    Hypertension    Osteoarthritis    Pars defect of lumbar spine    Seizures (HCC)    Spondylolisthesis    lumbar     Medicines Meds ordered this encounter  Medications   LORazepam (ATIVAN) 2 MG/ML injection    Renne Crigler S: cabinet override   gadobutrol (GADAVIST) 1 MMOL/ML injection 6 mL    I have reviewed the patients home medicines and have made adjustments as needed  Problem List / ED Course: Problem List Items Addressed This Visit   None               This note was created using dictation software, which may contain spelling or grammatical errors.    Loetta Rough, MD 12/02/22 1640

## 2022-12-02 NOTE — ED Triage Notes (Signed)
Per EMS report, pt has a hx of a brain mass and seizure disorder. She was being weaned off her keppra. Pt started tremors/seizures around 1030 this am. Pt is A&O x4. Pt arrives having visible tremors in whole body. Pt's left side paralyzed, but denies hx of stroke.

## 2022-12-02 NOTE — Discharge Instructions (Addendum)
Your MRI today showed worsening edema around her known tumor which is causing swelling in your brain.  Our neurologist recommended increasing her Keppra to 1500 mg twice daily as well as starting you on steroids.  You need to call Dr. Barbaraann Cao tomorrow morning to schedule close follow-up.  If you develop repeat seizures or any other new concerning symptoms you should return to the ED.

## 2022-12-02 NOTE — ED Provider Notes (Signed)
Handoff received from prior provider.  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  Patient had 2-hour left-sided focal seizure today despite taking her Keppra 1000 twice daily.  She is currently at her baseline but require Ativan to break the seizure.  Discussed with Dr. Barbaraann Cao with neuro-oncology who recommended MRI.  Handoff with plan to follow-up MRI and discussed with neuro-oncology versus neurology.  MRI shows increased edema with new mass effect.  Unfortunately neuro-oncology is no longer on-call.  Will discuss with neurology.  She remains at her baseline.  No additional seizures.  I spoke with reviewed patient's history and presentation with Dr. Amada Jupiter.  He reviewed her MRI.  He recommends increasing Keppra to 1500 mg twice daily.  Giving 10 mg of Decadron here and then starting 4 mg twice daily.  He recommends patient call Dr. Barbaraann Cao tomorrow to schedule follow-up.  No indication for admission given she is back to her baseline without additional seizures.  On reassessment she is feeling well.  She is at her baseline.  No additional seizures.  I reviewed plan including plan for discharge, restarting steroids, increasing Keppra and calling Dr. Barbaraann Cao tomorrow.  She is comfortable this plan.  Gave her strict return precautions for any recurrent symptoms including seizures.   Laurence Spates, MD 12/03/22 [phone]

## 2022-12-02 NOTE — Telephone Encounter (Signed)
[place] with [facility] called to say this patient is having a seizure, she bent down to put on a brief this morning & began "violently shaking, a body tremor" intermittently.  This has been going on for approximately 10 minutes, EMS was called & is taking her to [facility] ED.  Dr Barbaraann Cao informed.

## 2022-12-02 NOTE — ED Notes (Signed)
Attempted to straight cath pt for urine sample. Was unsuccessful. Pt had just voided. Changed her brief and applied a clean one.

## 2022-12-02 NOTE — ED Notes (Signed)
Pt desatting in to low 90's after ativan. Put her on 1L [place]. Provider made aware.

## 2022-12-03 ENCOUNTER — Other Ambulatory Visit: Payer: Self-pay | Admitting: Radiation Therapy

## 2022-12-07 ENCOUNTER — Other Ambulatory Visit: Payer: Self-pay | Admitting: *Deleted

## 2022-12-08 ENCOUNTER — Inpatient Hospital Stay: Payer: BC Managed Care – PPO | Admitting: Internal Medicine

## 2022-12-09 NOTE — Plan of Care (Signed)
[facility] Tonsillectomy/Adenoidectomy, Postoperative PEDS care plan entered in error.

## 2022-12-10 ENCOUNTER — Inpatient Hospital Stay: Payer: BC Managed Care – PPO | Attending: Internal Medicine | Admitting: Internal Medicine

## 2022-12-10 VITALS — BP 119/73 | HR 75 | Temp 97.7°F | Resp 18 | Wt 133.0 lb

## 2022-12-10 DIAGNOSIS — G40909 Epilepsy, unspecified, not intractable, without status epilepticus: Secondary | ICD-10-CM | POA: Insufficient documentation

## 2022-12-10 DIAGNOSIS — Y842 Radiological procedure and radiotherapy as the cause of abnormal reaction of the patient, or of later complication, without mention of misadventure at the time of the procedure: Secondary | ICD-10-CM

## 2022-12-10 DIAGNOSIS — C719 Malignant neoplasm of brain, unspecified: Secondary | ICD-10-CM | POA: Diagnosis not present

## 2022-12-10 DIAGNOSIS — C712 Malignant neoplasm of temporal lobe: Secondary | ICD-10-CM | POA: Diagnosis present

## 2022-12-10 DIAGNOSIS — I6789 Other cerebrovascular disease: Secondary | ICD-10-CM

## 2022-12-10 DIAGNOSIS — Z79899 Other long term (current) drug therapy: Secondary | ICD-10-CM | POA: Insufficient documentation

## 2022-12-10 MED ORDER — LAMOTRIGINE 100 MG PO TABS: 100.0000 mg | ORAL_TABLET | Freq: Every day | ORAL | 1 refills | Status: DC

## 2022-12-10 MED ORDER — DEXAMETHASONE 4 MG PO TABS: 4.0000 mg | ORAL_TABLET | Freq: Every day | ORAL | Status: DC

## 2022-12-10 MED ORDER — LAMOTRIGINE 25 MG PO TABS: ORAL_TABLET | ORAL | 0 refills | Status: DC

## 2022-12-10 MED ORDER — NAYZILAM 5 MG/0.1ML NA SOLN: 5.0000 mg | Freq: Four times a day (QID) | NASAL | 0 refills | Status: DC | PRN

## 2022-12-10 NOTE — Progress Notes (Signed)
[facility] at [facility] [address]  [place], [place] [address] [phone]   Interval Evaluation  Date of Service: 12/10/22 Patient Name: Lindsey Daniels Patient MRN: [id number] Patient DOB: [birth year] Provider: Henreitta Leber, MD  Identifying Statement:  Sharrah Brusseau is a 67 y.o. female with right temporal  pleomorphic xanthoastrocytoma    Oncologic History: Oncology History  Pleomorphic xanthoastrocytoma (HCC)  09/29/2013 Surgery   Right temporal craniotomy, resection with Dr. Newell Coral.  Path is PXA WHO II.   11/29/2020 Surgery   Disease progression, second craniotomy with Dr. Franky Macho, sub-total resection.  Path is unchanged.   02/21/2021 - 04/02/2021 Radiation Therapy   IMRT radiation therapy with Dr. Mitzi Hansen   12/16/2021 Treatment Plan Change   Completes 4 cycles of avastin 10mg /kg for severe inflammation, poor tolerance of steroids     Biomarkers:   Interval History: Lindsey Daniels presents today for evaluation following recent admission for breakthrough seizure.  She describes twitching of the left side which persisted for more than 2 hours.  During admission Keppra was increased  to 1500mg  twice per day, decadron restarted 4mg  twice per day.  She remains quite weak on the left side compared to prior, particularly with the arm.  She is walking minimally with the walker, mostly using the wheelchair.  Very high levels of irritability, mood swings noted at the facility.  She is confused at times, but falls have been avoided.  H+P (01/30/21) Patient presents today after recent re-resection for progressive right temporal PXA.  She initially presented with a seizure in 2015; imaging demonstrated enhancing right temporal lesion which was grossly resected by Dr. Newell Coral.  After last follow up in 2020, she began to experience headaches, ear fullness and some confusion this fall.  Imaging demonstrated marked progression of disease; she went for second  surgery on 11/29/20.  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   Medications: Current Outpatient Medications on File Prior to Visit  Medication Sig Dispense Refill   amLODipine (NORVASC) 10 MG tablet Take 1 tablet (10 mg total) by mouth daily.     apixaban (ELIQUIS) 5 MG TABS tablet Take 1 tablet (5 mg total) by mouth 2 (two) times daily. 60 tablet 2   atorvastatin (LIPITOR) 20 MG tablet Take 1 tablet (20 mg total) by mouth daily. 90 tablet 3   cetirizine (ZYRTEC) 10 MG tablet Take 10 mg by mouth daily.     cholecalciferol (VITAMIN D) 1000 units tablet Take 1,000 Units by mouth daily.     fluticasone (FLONASE) 50 MCG/ACT nasal spray Place 1-2 sprays into both nostrils daily. (Patient taking differently: Place 2 sprays into both nostrils daily.) 16 g 0   levETIRAcetam (KEPPRA) 750 MG tablet Take 2 tablets (1,500 mg total) by mouth 2 (two) times daily. 120 tablet 1   losartan (COZAAR) 50 MG tablet Take 1 tablet (50 mg total) by mouth daily.     melatonin 5 MG TABS Take 5 mg by mouth at bedtime.     Multiple Vitamin (MULTIVITAMIN WITH MINERALS) TABS tablet Take 1 tablet by mouth daily.     omeprazole (PRILOSEC) 20 MG capsule Take 20 mg by mouth daily.     sertraline (ZOLOFT) 100 MG tablet Take 1 tablet (100 mg total) by mouth daily. 90 tablet 3   sertraline (ZOLOFT) 50 MG tablet Take 50 mg by mouth daily.  No current facility-administered medications on file prior to visit.    Allergies:  Allergies  Allergen Reactions   Rifapentine Other (See Comments)    Flu-like symptoms   Amoxicillin Rash   Clavulanic Acid Rash   Past Medical History:  Past Medical History:  Diagnosis Date   Allergy    Anal fissure    Anxiety    Cataract    Depression    Elevated LFTs    Hyperglycemia    Hyperlipidemia    Hypertension    Osteoarthritis    Pars defect of lumbar spine    Seizures (HCC)    Spondylolisthesis     lumbar   Past Surgical History:  Past Surgical History:  Procedure Laterality Date   cataract     CRANIOTOMY N/A 10/02/2013   Procedure: 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;  Surgeon: Hewitt Shorts, MD;  Location: [facility];  Service: Neurosurgery;  Laterality: N/A;  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   CRANIOTOMY Right 11/29/2020   Procedure: Right Parietal craniotomy for tumor resection;  Surgeon: Coletta Memos, MD;  Location: [facility] OR;  Service: Neurosurgery;  Laterality: Right;   HEEL SPUR SURGERY     Social History:  Social History   Socioeconomic History   Marital status: Divorced    Spouse name: separated-no papers file   Number of children: 0   Years of education: Not on file   Highest education level: Not on file  Occupational History   Occupation: RT    Employer: [employer] @ [employer]    Comment: [employer] and [employer]  Tobacco Use   Smoking status: Never   Smokeless tobacco: Never  Substance and Sexual Activity   Alcohol use: Yes    Alcohol/week: 0.0 - 4.0 standard drinks of alcohol    Comment: social   Drug use: No   Sexual activity: Not Currently    Partners: Male  Other Topics Concern   Not on file  Social History Narrative   Separated from second husband.  Very contentious split.   Social Determinants of Health   Financial Resource Strain: Low Risk  (01/15/2022)   Overall Financial Resource Strain (CARDIA)    Difficulty of Paying Living Expenses: Not hard at all  Food Insecurity: No Food Insecurity (01/15/2022)   Hunger Vital Sign    Worried About Running Out of Food in the Last Year: Never true    Ran Out of Food in the Last Year: Never true  Transportation Needs: No Transportation Needs (01/15/2022)   PRAPARE - Administrator, Civil Service (Medical): No    Lack of Transportation (Non-Medical): No  Physical Activity: Inactive (02/03/2021)   [facility] from [facility],  [facility]   Exercise Vital Sign    Days of Exercise per Week: 0 days    Minutes of Exercise per Session: 0 min  Stress: Stress Concern Present (02/03/2021)   Received from [facility], [facility] of Occupational Health - Occupational Stress Questionnaire    Feeling of Stress : To some extent  Social Connections: Unknown (06/16/2021)   Received from [facility], [facility]   Social Network    Social Network: Not on file  Intimate Partner Violence: Unknown (05/22/2021)   Received from [facility], [facility]   HITS    Physically Hurt: Not on file    Insult or Talk Down To: Not on file    Threaten Physical Harm:  Not on file    Scream or Curse: Not on file   Family History:  Family History  Problem Relation Age of Onset   Mental illness Father        depression   COPD Father    Alcohol abuse Brother    Breast cancer Neg Hx     Review of Systems: Constitutional: Doesn't report fevers, chills or abnormal weight loss Eyes: Doesn't report blurriness of vision Ears, nose, mouth, throat, and face: Doesn't report sore throat Respiratory: +dyspnea Cardiovascular: Doesn't report palpitation, chest discomfort  Gastrointestinal:  Doesn't report nausea, constipation, diarrhea GU: Doesn't report incontinence Skin: Doesn't report skin rashes Neurological: Per HPI Musculoskeletal: Doesn't report joint pain Behavioral/Psych: Doesn't report anxiety  Physical Exam: Vitals:   12/10/22 1134  BP: 119/73  Pulse: 75  Resp: 18  Temp: 97.7 F (36.5 C)  SpO2: 99%   KPS: 60. General: cushingoid Head: Normal EENT: No conjunctival injection or scleral icterus.  Lungs: Normal Cardiac: Regular rate Abdomen: Non-distended abdomen Skin: +purpura, arms and legs Extremities: +edema  Neurologic Exam: Mental Status: Drowsy, alert, attentive to examiner with conversation. Oriented to self and environment. Language is fluent with intact comprehension.  Displays  some disorganized thinking. Age advanced psychomotor slowing. Cranial Nerves: Visual acuity is grossly normal. Visual fields are full. Extra-ocular movements intact. No ptosis. Face is symmetric Motor: Tone and bulk are normal. Power is 3/5 in left arm, 4/5 in left leg. Reflexes are symmetric, no pathologic reflexes present.  Sensory: Intact to light touch Gait: Deferred   Labs: I have reviewed the data as listed    Component Value Date/Time   NA 139 12/02/2022 1245   NA 142 06/30/2017 1815   K 3.9 12/02/2022 1245   CL 103 12/02/2022 1245   CO2 23 12/02/2022 1224   GLUCOSE 97 12/02/2022 1245   BUN 8 12/02/2022 1245   BUN 14 06/30/2017 1815   CREATININE 0.50 12/02/2022 1245   CREATININE 0.62 05/12/2022 1510   CREATININE 0.72 06/07/2015 0825   CALCIUM 9.6 12/02/2022 1224   PROT 6.4 (L) 12/02/2022 1224   PROT 7.0 06/30/2017 1815   ALBUMIN 3.4 (L) 12/02/2022 1224   ALBUMIN 4.6 06/30/2017 1815   AST 39 12/02/2022 1224   AST 25 05/12/2022 1510   ALT 17 12/02/2022 1224   ALT 32 05/12/2022 1510   ALKPHOS 104 12/02/2022 1224   BILITOT 0.7 12/02/2022 1224   BILITOT 0.7 05/12/2022 1510   GFRNONAA >60 12/02/2022 1224   GFRNONAA >60 05/12/2022 1510   GFRAA  01/18/2022 0405    UNSATISFACTORY SPECIMEN. CLIENT REQUESTS SPECIMEN TO BE ANALYZED.   Lab Results  Component Value Date   WBC 7.6 12/02/2022   NEUTROABS 5.1 12/02/2022   HGB 13.3 12/02/2022   HCT 39.0 12/02/2022   MCV 86.4 12/02/2022   PLT 360 12/02/2022    Imaging:  [facility] Clinician Interpretation: I have personally reviewed the CNS images as listed.  My interpretation, in the context of the patient's clinical presentation, is treatment effect vs true progression  MR Brain W and Wo Contrast  Result Date: 12/02/2022 CLINICAL DATA:  Pleomorphic xanthoastrocytoma, assess treatment response EXAM: MRI HEAD WITHOUT AND WITH CONTRAST TECHNIQUE: Multiplanar, multiecho pulse sequences of the brain and surrounding structures were  obtained without and with intravenous contrast. CONTRAST:  6mL GADAVIST GADOBUTROL 1 MMOL/ML IV SOLN COMPARISON:  07/09/2022 FINDINGS: Evaluation is somewhat limited by motion artifact. Brain: Status post right craniotomy with subjacent resection cavity in the right temporal lobe.  Overall increased abnormal enhancement about the resection cavity, which is irregular and ill-defined but measures approximately 7.3 x 4.2 x 4.5 cm (AP x TR x CC) (series 11, image 30 and series 13, image 13), previously 4.8 x 3.5 x 3.1 cm when remeasured similarly. Surrounding T2 hyperintense signal has increased, most notable in the right frontal and parietal lobes (series 7, image 25), with increased T2 signal also noted in the right aspect the midbrain and pons (series 7, images 12-14. Increased mass effect on the body of right lateral ventricle with 6 mm of right to left midline shift, new from the prior. There is also increased mass effect on the right occipital and temporal horns, which are effaced. No acute hemorrhage or extra-axial collection. No evidence of acute infarct. Vascular: Normal arterial flow voids. Normal arterial and venous enhancement. Skull and upper cervical spine: Right craniotomy. Otherwise normal marrow signal. Sinuses/Orbits: Chronic left maxillary sinusitis. Mild mucosal thickening in the ethmoid air cells. Status post bilateral lens replacements. Other: Fluid in right mastoid air cells. IMPRESSION: Evaluation is somewhat limited by motion artifact. Within this limitation, there is increased abnormal enhancement about the right temporal resection cavity, with increased surrounding edema, concerning for disease progression. Increased mass effect on the right lateral ventricle. 6 mm of right to left midline shift is new from the prior exam. Electronically Signed   By: Wiliam Ke M.D.   On: 12/02/2022 19:39    Assessment/Plan Pleomorphic xanthoastrocytoma [facility])  Seizure disorder (HCC)  Darnell Wachob  is clinically improved following breakthrough prolonged seizure, post-ictal encephalopathy and Todd's paralysis.  She remains weaker than prior baseline in the left arm and leg.  MRI brain demonstrates progression of disease, with increased volume of enhancement and T2/FLAIR signal abnormality.  Etiology is unclear; recurrence of radionecrosis, recurrence of tumor, artifact of status epilepticus are all possible.  We discussed Daniels in CNS tumor board this week; recommended proceeding with FDG-PET for metabolic stratification.  If metabolically active, will give strong consideration to brain biopsy, vs proceeding with systemic therapy.  If cold on PET, could resume avastin infusions if clinically indicated.  We recommended to continue Keppra 1500mg  BID, but with significant mood effects we will attempt transition to Lamictal.  Will start Lamictal 25mg  daily x7 days, followed by 50mg  daily x7 days, then 100mg  daily.  Once full dose of Lamictal (100mg  BID) is achieved, can begin to down-titrate Keppra carefully.    Also provided script for Nayzilam intranasal rescue for seizure activity greater than 5 minutes.  Decadron should be decreased to 4mg  daily if tolerated, also interfering with mood.    She will continue to work aggressively with PT as discussed.  We ask that Germaine Pomfret Mckinstry return to clinic in ~3 weeks for evaluation, or sooner if needed.  All questions were answered. The patient knows to call the clinic with any problems, questions or concerns. No barriers to learning were detected.  The total time spent in the encounter was 40 minutes and more than 50% was on counseling and review of test results   Henreitta Leber, MD Medical Director of Neuro-Oncology [facility] at [facility] 12/10/22 2:19 PM

## 2022-12-21 ENCOUNTER — Other Ambulatory Visit: Payer: Self-pay | Admitting: *Deleted

## 2022-12-21 ENCOUNTER — Encounter: Payer: Self-pay | Admitting: *Deleted

## 2022-12-21 DIAGNOSIS — C719 Malignant neoplasm of brain, unspecified: Secondary | ICD-10-CM

## 2022-12-21 NOTE — Progress Notes (Signed)
Faxed Physical Therapy referral to [facility] [phone].

## 2022-12-21 NOTE — Progress Notes (Signed)
Faxed referral for PT over to [facility].

## 2022-12-25 ENCOUNTER — Telehealth: Payer: Self-pay | Admitting: Medical Oncology

## 2022-12-25 NOTE — Telephone Encounter (Signed)
Verbal confirmation given by [place] [facility] that they received the PT order.

## 2022-12-25 NOTE — Telephone Encounter (Signed)
Faxed PT referral to [facility] with receipt of confirmation.

## 2022-12-28 ENCOUNTER — Ambulatory Visit (HOSPITAL_COMMUNITY)
Admission: RE | Admit: 2022-12-28 | Discharge: 2022-12-28 | Disposition: A | Discharge status: Home / Self Care | Payer: BC Managed Care – PPO | Source: Ambulatory Visit | Attending: Internal Medicine | Admitting: Internal Medicine

## 2022-12-28 DIAGNOSIS — I6789 Other cerebrovascular disease: Secondary | ICD-10-CM | POA: Insufficient documentation

## 2022-12-28 DIAGNOSIS — C719 Malignant neoplasm of brain, unspecified: Secondary | ICD-10-CM | POA: Insufficient documentation

## 2022-12-28 DIAGNOSIS — Y842 Radiological procedure and radiotherapy as the cause of abnormal reaction of the patient, or of later complication, without mention of misadventure at the time of the procedure: Secondary | ICD-10-CM | POA: Insufficient documentation

## 2022-12-28 LAB — GLUCOSE, CAPILLARY: Glucose-Capillary: 144 mg/dL — ABNORMAL HIGH (ref 70–99)

## 2022-12-28 MED ORDER — FLUDEOXYGLUCOSE F - 18 (FDG) INJECTION
7.0000 | Freq: Once | INTRAVENOUS | Status: AC | PRN
  Administered 2022-12-28: 9.9 via INTRAVENOUS

## 2022-12-31 ENCOUNTER — Encounter (HOSPITAL_COMMUNITY): Payer: Self-pay

## 2022-12-31 ENCOUNTER — Inpatient Hospital Stay: Payer: BC Managed Care – PPO | Admitting: Internal Medicine

## 2022-12-31 ENCOUNTER — Inpatient Hospital Stay (HOSPITAL_COMMUNITY)
Admission: EM | Admit: 2022-12-31 | Discharge: 2023-01-06 | DRG: 100 | Disposition: A | Discharge status: Skilled Nursing Facility | Payer: BC Managed Care – PPO | Source: Skilled Nursing Facility | Attending: Family Medicine | Admitting: Family Medicine

## 2022-12-31 ENCOUNTER — Emergency Department (HOSPITAL_COMMUNITY): Payer: BC Managed Care – PPO

## 2022-12-31 ENCOUNTER — Other Ambulatory Visit: Payer: Self-pay

## 2022-12-31 DIAGNOSIS — Z818 Family history of other mental and behavioral disorders: Secondary | ICD-10-CM | POA: Diagnosis not present

## 2022-12-31 DIAGNOSIS — G936 Cerebral edema: Secondary | ICD-10-CM | POA: Diagnosis present

## 2022-12-31 DIAGNOSIS — Z880 Allergy status to penicillin: Secondary | ICD-10-CM | POA: Diagnosis not present

## 2022-12-31 DIAGNOSIS — Z923 Personal history of irradiation: Secondary | ICD-10-CM

## 2022-12-31 DIAGNOSIS — I10 Essential (primary) hypertension: Secondary | ICD-10-CM | POA: Diagnosis present

## 2022-12-31 DIAGNOSIS — Z85841 Personal history of malignant neoplasm of brain: Secondary | ICD-10-CM

## 2022-12-31 DIAGNOSIS — E861 Hypovolemia: Secondary | ICD-10-CM | POA: Diagnosis present

## 2022-12-31 DIAGNOSIS — G8384 Todd's paralysis (postepileptic): Secondary | ICD-10-CM | POA: Diagnosis present

## 2022-12-31 DIAGNOSIS — E785 Hyperlipidemia, unspecified: Secondary | ICD-10-CM | POA: Diagnosis present

## 2022-12-31 DIAGNOSIS — Z7901 Long term (current) use of anticoagulants: Secondary | ICD-10-CM | POA: Diagnosis not present

## 2022-12-31 DIAGNOSIS — Z825 Family history of asthma and other chronic lower respiratory diseases: Secondary | ICD-10-CM

## 2022-12-31 DIAGNOSIS — G40901 Epilepsy, unspecified, not intractable, with status epilepticus: Principal | ICD-10-CM | POA: Diagnosis present

## 2022-12-31 DIAGNOSIS — Z888 Allergy status to other drugs, medicaments and biological substances status: Secondary | ICD-10-CM

## 2022-12-31 DIAGNOSIS — Z66 Do not resuscitate: Secondary | ICD-10-CM | POA: Diagnosis present

## 2022-12-31 DIAGNOSIS — D696 Thrombocytopenia, unspecified: Secondary | ICD-10-CM

## 2022-12-31 DIAGNOSIS — Z9221 Personal history of antineoplastic chemotherapy: Secondary | ICD-10-CM

## 2022-12-31 DIAGNOSIS — E871 Hypo-osmolality and hyponatremia: Secondary | ICD-10-CM

## 2022-12-31 DIAGNOSIS — Z79899 Other long term (current) drug therapy: Secondary | ICD-10-CM | POA: Diagnosis not present

## 2022-12-31 DIAGNOSIS — R29810 Facial weakness: Secondary | ICD-10-CM | POA: Diagnosis present

## 2022-12-31 DIAGNOSIS — Z811 Family history of alcohol abuse and dependence: Secondary | ICD-10-CM | POA: Diagnosis not present

## 2022-12-31 DIAGNOSIS — K219 Gastro-esophageal reflux disease without esophagitis: Secondary | ICD-10-CM | POA: Diagnosis present

## 2022-12-31 DIAGNOSIS — R569 Unspecified convulsions: Secondary | ICD-10-CM

## 2022-12-31 DIAGNOSIS — F32A Depression, unspecified: Secondary | ICD-10-CM | POA: Diagnosis present

## 2022-12-31 DIAGNOSIS — I447 Left bundle-branch block, unspecified: Secondary | ICD-10-CM | POA: Diagnosis present

## 2022-12-31 DIAGNOSIS — C719 Malignant neoplasm of brain, unspecified: Secondary | ICD-10-CM | POA: Diagnosis present

## 2022-12-31 DIAGNOSIS — R531 Weakness: Principal | ICD-10-CM

## 2022-12-31 DIAGNOSIS — Z86711 Personal history of pulmonary embolism: Secondary | ICD-10-CM

## 2022-12-31 DIAGNOSIS — Z0189 Encounter for other specified special examinations: Secondary | ICD-10-CM | POA: Diagnosis not present

## 2022-12-31 HISTORY — DX: Malignant neoplasm of temporal lobe: C71.2

## 2022-12-31 HISTORY — DX: Thrombocytopenia, unspecified: D69.6

## 2022-12-31 LAB — APTT: aPTT: 25 s (ref 24–36)

## 2022-12-31 LAB — DIFFERENTIAL
Abs Immature Granulocytes: 0.07 K/uL (ref 0.00–0.07)
Basophils Absolute: 0 K/uL (ref 0.0–0.1)
Basophils Relative: 1 %
Eosinophils Absolute: 0.1 K/uL (ref 0.0–0.5)
Eosinophils Relative: 2 %
Immature Granulocytes: 1 %
Lymphocytes Relative: 17 %
Lymphs Abs: 1.1 K/uL (ref 0.7–4.0)
Monocytes Absolute: 0.8 K/uL (ref 0.1–1.0)
Monocytes Relative: 11 %
Neutro Abs: 4.5 K/uL (ref 1.7–7.7)
Neutrophils Relative %: 68 %

## 2022-12-31 LAB — COMPREHENSIVE METABOLIC PANEL
ALT: 23 U/L (ref 0–44)
AST: 26 U/L (ref 15–41)
Albumin: 3.2 g/dL — ABNORMAL LOW (ref 3.5–5.0)
Alkaline Phosphatase: 93 U/L (ref 38–126)
Anion gap: 9 (ref 5–15)
BUN: 15 mg/dL (ref 8–23)
CO2: 26 mmol/L (ref 22–32)
Calcium: 8.8 mg/dL — ABNORMAL LOW (ref 8.9–10.3)
Chloride: 95 mmol/L — ABNORMAL LOW (ref 98–111)
Creatinine, Ser: 0.86 mg/dL (ref 0.44–1.00)
GFR, Estimated: >60 mL/min (ref >60)
Glucose, Bld: 116 mg/dL — ABNORMAL HIGH (ref 70–99)
Potassium: 3.7 mmol/L (ref 3.5–5.1)
Sodium: 130 mmol/L — ABNORMAL LOW (ref 135–145)
Total Bilirubin: 1.9 mg/dL — ABNORMAL HIGH (ref <1.2)
Total Protein: 5.8 g/dL — ABNORMAL LOW (ref 6.5–8.1)

## 2022-12-31 LAB — CBC
HCT: 42.7 % (ref 36.0–46.0)
Hemoglobin: 13.9 g/dL (ref 12.0–15.0)
MCH: 29.4 pg (ref 26.0–34.0)
MCHC: 32.6 g/dL (ref 30.0–36.0)
MCV: 90.5 fL (ref 80.0–100.0)
Platelets: 132 K/uL — ABNORMAL LOW (ref 150–400)
RBC: 4.72 MIL/uL (ref 3.87–5.11)
RDW: 15.3 % (ref 11.5–15.5)
WBC: 6.6 K/uL (ref 4.0–10.5)
nRBC: 0 % (ref 0.0–0.2)

## 2022-12-31 LAB — I-STAT CHEM 8, ED
BUN: 20 mg/dL (ref 8–23)
Calcium, Ion: 0.98 mmol/L — ABNORMAL LOW (ref 1.15–1.40)
Chloride: 96 mmol/L — ABNORMAL LOW (ref 98–111)
Creatinine, Ser: 0.7 mg/dL (ref 0.44–1.00)
Glucose, Bld: 117 mg/dL — ABNORMAL HIGH (ref 70–99)
HCT: 42 % (ref 36.0–46.0)
Hemoglobin: 14.3 g/dL (ref 12.0–15.0)
Potassium: 3.8 mmol/L (ref 3.5–5.1)
Sodium: 132 mmol/L — ABNORMAL LOW (ref 135–145)
TCO2: 29 mmol/L (ref 22–32)

## 2022-12-31 LAB — BASIC METABOLIC PANEL
Anion gap: 11 (ref 5–15)
BUN: 18 mg/dL (ref 8–23)
CO2: 24 mmol/L (ref 22–32)
Calcium: 9 mg/dL (ref 8.9–10.3)
Chloride: 97 mmol/L — ABNORMAL LOW (ref 98–111)
Creatinine, Ser: 0.59 mg/dL (ref 0.44–1.00)
GFR, Estimated: >60 mL/min (ref >60)
Glucose, Bld: 107 mg/dL — ABNORMAL HIGH (ref 70–99)
Potassium: 4.4 mmol/L (ref 3.5–5.1)
Sodium: 132 mmol/L — ABNORMAL LOW (ref 135–145)

## 2022-12-31 LAB — I-STAT CG4 LACTIC ACID, ED: Lactic Acid, Venous: 1.7 mmol/L (ref 0.5–1.9)

## 2022-12-31 LAB — PROTIME-INR
INR: 1.1 (ref 0.8–1.2)
Prothrombin Time: 14.2 s (ref 11.4–15.2)

## 2022-12-31 LAB — SAMPLE TO BLOOD BANK

## 2022-12-31 LAB — ETHANOL: Alcohol, Ethyl (B): <10 mg/dL (ref <10)

## 2022-12-31 LAB — CBG MONITORING, ED: Glucose-Capillary: 124 mg/dL — ABNORMAL HIGH (ref 70–99)

## 2022-12-31 MED ORDER — LEVETIRACETAM 750 MG PO TABS
1500.0000 mg | ORAL_TABLET | Freq: Two times a day (BID) | ORAL | Status: DC
  Administered 2022-12-31 – 2023-01-06 (×12): 1500 mg via ORAL
  Filled 2022-12-31 (×12): qty 2

## 2022-12-31 MED ORDER — PANTOPRAZOLE SODIUM 40 MG PO TBEC
40.0000 mg | DELAYED_RELEASE_TABLET | Freq: Every day | ORAL | Status: DC
  Administered 2023-01-01 – 2023-01-06 (×6): 40 mg via ORAL
  Filled 2022-12-31 (×6): qty 1

## 2022-12-31 MED ORDER — ATORVASTATIN CALCIUM 10 MG PO TABS
20.0000 mg | ORAL_TABLET | Freq: Every day | ORAL | Status: DC
  Administered 2023-01-01 – 2023-01-06 (×6): 20 mg via ORAL
  Filled 2022-12-31 (×6): qty 2

## 2022-12-31 MED ORDER — APIXABAN 5 MG PO TABS
5.0000 mg | ORAL_TABLET | Freq: Two times a day (BID) | ORAL | Status: DC
  Administered 2022-12-31 – 2023-01-06 (×12): 5 mg via ORAL
  Filled 2022-12-31 (×12): qty 1

## 2022-12-31 MED ORDER — SERTRALINE HCL 50 MG PO TABS
150.0000 mg | ORAL_TABLET | Freq: Every day | ORAL | Status: DC
  Administered 2023-01-01 – 2023-01-06 (×6): 150 mg via ORAL
  Filled 2022-12-31 (×6): qty 1

## 2022-12-31 MED ORDER — GADOBUTROL 1 MMOL/ML IV SOLN
6.0000 mL | Freq: Once | INTRAVENOUS | Status: AC | PRN
  Administered 2022-12-31: 6 mL via INTRAVENOUS

## 2022-12-31 MED ORDER — LAMOTRIGINE 25 MG PO TABS
150.0000 mg | ORAL_TABLET | Freq: Two times a day (BID) | ORAL | Status: DC
  Administered 2022-12-31 – 2023-01-01 (×2): 150 mg via ORAL
  Filled 2022-12-31 (×2): qty 2

## 2022-12-31 MED ORDER — DEXAMETHASONE 4 MG PO TABS
4.0000 mg | ORAL_TABLET | Freq: Every day | ORAL | Status: DC
  Administered 2023-01-01 – 2023-01-06 (×6): 4 mg via ORAL
  Filled 2022-12-31 (×6): qty 1

## 2022-12-31 NOTE — H&P (Addendum)
Hospital Admission History and Physical Service Pager: [phone]  Patient name: Lindsey Daniels Medical record number: [id number] Date of Birth: 1955/10/08 Age: 67 y.o. Gender: female  Primary Care Provider: Porfirio Oar, PA Consultants: Neurology  Code Status: DNR-pre-arrest intervention desired (Form in media tab) Preferred Emergency Contact:  Contact Information     Name Relation Home Work Mobile   Hudson,Cindy Friend   [phone]   [place] Artist) Brother [phone]  [phone]      Other Contacts     Name Relation Home Work Mobile   Hopkins,Valerie Friend   [phone]        Chief Complaint: Seizure  Left Sided Weakness   Assessment and Plan: Lindsey Daniels is a 67 y.o. female presenting with worsening left sided weakness and change in speech. Differential for presentation of this includes:  Todd's Paralysis: post ictal paralysis is most likely cause at this time given negative head imaging to this point. However, MR pending  CVA/TIA/hemorrhage versus ischemic: Less likely given negative CT head, MR pending.  Appreciate recommendations from neurology. SDH: Possible head trauma, on anticoagulation Peripheral neuropathy: Less likely given presentation Myelopathy: Does not appear to be high on the differential but will continue to monitor symptoms Functional neurologic disorder: Also possible to have conversion symptoms  Electrolyte derangement: Hyponatremic on admission.  Assessment & Plan Left-sided weakness Neurology consulted in the ED, initially, code stroke was called, Negative CT with MR pending.  Will continue with further workup with neurology rec.  Seems to be closer to baseline on my evaluation after discussion with staff from [facility]. -Admit to FMTS, Dr. Lum Babe as attending -AM CBC/BMP - Appreciate neurorecommendations - PT/OT/SLP - Swallow study, n.p.o. until completed - Delirium precautions, fall and seizure precautions -  Await MR results prior to starting back anticoagulant - SCDs - MR angio - UDS, UA Hyponatremia Unknown if acute or chronic.  However, the patient does have history of normal sodium approximately 4 weeks ago.  Seems to be hypovolemic per evaluation.  Encourage p.o. intake once passing swallow study.   - Monitor with BMP (q12 unless indicated sooner) Thrombocytopenia (HCC) Monitor CBC in the morning   Chronic and Stable Problems:  Has history of sz disorder, uncontrolled: Previously on Decadron and Keppra 1500 mg twice daily but has struggled with breakthrough seizures.  Defer to neurology for assistance with AEDs.  Cautious with sedation as patient has required elective intubation in the past for sedative medications HLD: Atorvastatin 20 mg, restart  H/o Pleomorphic xanthoastrocytoma with craniotomy & XRT: Follows with oncology outpatient H/o HSV Depression: Zoloft 150 mg--restart HTN: Amlodipine and losartan, holding in setting of soft BP Pulmonary embolism: Eliquis 5mg  BID, holding for now but restart when all head imaging has returned     FEN/GI: NPO>swallow study  VTE Prophylaxis: Restart Eliquis after head imaging returns (CT negative)  Disposition: Med Tele  History of Present Illness:  Lindsey Daniels is a 67 y.o. female presenting with new onset left sided weakness, LKW 0500.   Upon speaking with [facility], patient was in the dining room and had a seizure, came back to quickly yesterday (12/30/22). Finished eating after the seizure and went about her day. This morning, staff tried to get the patient out of bed, but she was unable to stand and was leaning to the left with left sided weakness. Unable to speak this AM at the facility per staff, thus prompting ED visit.  Able to speak for herself at  baseline, uses wheelchair and walker as gait is unsteady. At baseline, knows her location, schedule for the day, but does not generally know time. Tangential as well at  baseline.  At baseline, the patient does have left upper extremity weakness but it was worse this morning per staff.  Patient is tangential when I am in the room and slightly difficult to understand.  She does report that she takes Keppra for her seizure disorder.  Additionally, she says that her brother takes care of her and comes to see her intermittently.  She also notes that she had a fall yesterday.  Most history obtained from collateral information   In the ED, code stroke was called and had CT head that was negative, MRI head still pending.  Neurology was consulted and recommended admission to medicine service for further assessment of new unilateral weakness.   Review Of Systems: Per HPI with the following additions: See above   Pertinent Past Medical History: Has history of sz disorder, uncontrolled:  HLD H/o Pleomorphic xanthoastrocytoma with craniotomy & XRT  H/o HSV Depression  HTN Pulmonary embolism  Remainder reviewed in history tab.   Pertinent Past Surgical History: Craniotomy    Remainder reviewed in history tab.  Pertinent Social History: Tobacco use: No Alcohol use: Unknown - patient reported use to previous provider  Other Substance use: No  Lives at [facility] of [address]   Pertinent Family History: COPD  Depression   Remainder reviewed in history tab.   Important Outpatient Medications: Amlodipine 10 mg daily Eliquis 5 mg twice daily Lipitor 20 mg daily Take 10 mg daily Decadron 4 mg daily Flonase Keppra 1500 mg twice daily Losartan 50 mg daily Melatonin at night Midazolam for prolonged seizure Prilosec Zoloft 150 mg Lamictal 100 mg Remainder reviewed in medication history.   Objective: BP 117/81   Pulse 97   Temp 98 F (36.7 C) (Oral)   Resp 15   Ht 5\' 3"  (1.6 m)   Wt 65.8 kg   SpO2 97%   BMI 25.70 kg/m  Exam: General: NAD, nontoxic, responsive to questioning  ENTM: Normal nares and external ears Neck: No LAD Cardiovascular:  RRR Respiratory: CTAB Gastrointestinal: NTTP, nondistended, soft MSK: See neuro, DP pulses palpable  Derm: No rashes noted  Neuro: CN II: PERRL CN III, IV,VI: EOMI CV V: Unable to assess CVII: Symmetric smile and brow raise UE-LUE unable to lift off bed or squeeze finger RUE- able to lift and squeeze  LLE-Able to lift off of bed for ~3 seconds, unable to maintain with strength testing  RLE-Able to lift without difficulty Psych: tangential, appropriate mood and affect  Labs:  CBC BMET  Recent Labs  Lab 12/31/22 0849 12/31/22 0850  WBC 6.6  --   HGB 13.9 14.3  HCT 42.7 42.0  PLT 132*  --    Recent Labs  Lab 12/31/22 0849 12/31/22 0850  NA 130* 132*  K 3.7 3.8  CL 95* 96*  CO2 26  --   BUN 15 20  CREATININE 0.86 0.70  GLUCOSE 116* 117*  CALCIUM 8.8*  --     Pertinent additional labs Na 132>130, ethanol <10  EKG: Artifact heavy, QT 469, ST segment changes in EKG-similar to previous    Imaging Studies Performed:  CT Cervical Spine Wo Contrast  Result Date: 12/31/2022 IMPRESSION: No evidence of acute fracture or traumatic malalignment.   CT HEAD CODE STROKE WO CONTRAST  Result Date: 12/31/2022 IMPRESSION: 1. Redemonstrated known malignancy on the right  with apparent improved midline shift. Please see forthcoming MRI for further evaluation. 2. No evidence acute superimposed large vascular territory infarct or hemorrhage.     Alfredo Martinez, MD 12/31/2022, 10:37 AM PGY-3, [facility]  FPTS Intern pager: [phone], text pages welcome Secure chat group [facility] Teaching Service

## 2022-12-31 NOTE — Assessment & Plan Note (Signed)
Neurology consulted in the ED, initially, code stroke was called, Negative CT with MR pending.  Will continue with further workup with neurology rec.  Seems to be closer to baseline on my evaluation after discussion with staff from [facility]. -Admit to [facility], Dr. Lum Babe as attending -AM CBC/BMP - Appreciate neurorecommendations - PT/OT/SLP - Swallow study, n.p.o. until completed - Delirium precautions, fall and seizure precautions - Await MR results prior to starting back anticoagulant - SCDs - MR angio - UDS, UA

## 2022-12-31 NOTE — ED Triage Notes (Addendum)
Last night had a fall. Hit head against tile flooring. After an hour or 2, pt had a seizure episode. Hx of epilepsy. Takes Eliquis. Lives in [place]. Woke up and shortly, slurred speech and left sided facial droop and left sided weakness noted. Code stroke and Level 2 trauma activated

## 2022-12-31 NOTE — Assessment & Plan Note (Signed)
Unknown if acute or chronic.  However, the patient does have history of normal sodium approximately 4 weeks ago.  Seems to be hypovolemic per evaluation.  Encourage p.o. intake once passing swallow study.   - Monitor with BMP (q12 unless indicated sooner)

## 2022-12-31 NOTE — Evaluation (Signed)
Occupational Therapy Evaluation Patient Details Name: Lindsey Daniels MRN: [id number] DOB: 1955/12/14 Today's Date: 12/31/2022   History of Present Illness Pt is a 67 y/o female admitted 11/14 to ED via EMS due to having a focal seizure.  In ED pt showed left sided weakness, MD ? Todd's paralysis.  Pt is weak in the L UE at baseline.  PMHx:  crani for tumor x2, HTN, OA, seizures, spndylolisthesis, Temporal Lobe mass.   Clinical Impression   Patient admitted for the diagnosis above.  Difficult to get a clear prior level of function and description of home setup due to tangential thought.  Patient lives by herself, uses a 4WRW, and has PRN assist from friends.  Her brother lives in [place].  Given level of deficits, Patient will benefit from continued inpatient follow up therapy, <3 hours/day.  OT will follow in the acute setting address deficits, and assist with discharge planning depending on progress.          If plan is discharge home, recommend the following: A lot of help with bathing/dressing/bathroom;Two people to help with walking and/or transfers;Assist for transportation;Help with stairs or ramp for entrance;Assistance with feeding;Assistance with cooking/housework;Direct supervision/assist for financial management;Direct supervision/assist for medications management;Supervision due to cognitive status    Functional Status Assessment  Patient has had a recent decline in their functional status and demonstrates the ability to make significant improvements in function in a reasonable and predictable amount of time.  Equipment Recommendations  None recommended by OT    Recommendations for Other Services       Precautions / Restrictions Precautions Precautions: Fall Precaution Comments: watch HR Restrictions Weight Bearing Restrictions: No      Mobility Bed Mobility Overal bed mobility: Needs Assistance Bed Mobility: Rolling, Supine to Sit, Sit to Supine Rolling: Min  assist, Max assist   Supine to sit: Max assist Sit to supine: Max assist        Transfers                   General transfer comment: deferred      Balance Overall balance assessment: Needs assistance Sitting-balance support: Feet unsupported, Single extremity supported Sitting balance-Leahy Scale: Poor   Postural control: Posterior lean, Left lateral lean     Standing balance comment: NT                           ADL either performed or assessed with clinical judgement   ADL Overall ADL's : Needs assistance/impaired Eating/Feeding: Moderate assistance;Bed level   Grooming: Wash/dry hands;Wash/dry face;Moderate assistance;Bed level   Upper Body Bathing: Moderate assistance;Bed level   Lower Body Bathing: Total assistance;Bed level   Upper Body Dressing : Maximal assistance;Bed level   Lower Body Dressing: Total assistance;Bed level       Toileting- Clothing Manipulation and Hygiene: Total assistance;Bed level               Vision   Vision Assessment?: No apparent visual deficits     Perception Perception: Not tested       Praxis Praxis: Not tested       Pertinent Vitals/Pain Pain Assessment Pain Assessment: No/denies pain     Extremity/Trunk Assessment Upper Extremity Assessment Upper Extremity Assessment: Right hand dominant;LUE deficits/detail LUE Deficits / Details: Minimal spontaneous AROM noted to hand and elbow.  Weak shoulder shrug. LUE Sensation: decreased light touch;decreased proprioception LUE Coordination: decreased fine motor;decreased gross motor   Lower Extremity  Assessment Lower Extremity Assessment: Defer to PT evaluation   Cervical / Trunk Assessment Cervical / Trunk Assessment: Kyphotic   Communication Communication Communication: No apparent difficulties   Cognition Arousal: Alert Behavior During Therapy: Restless Overall Cognitive Status: No family/caregiver present to determine baseline  cognitive functioning Area of Impairment: Orientation, Attention, Memory, Following commands, Safety/judgement, Awareness, Problem solving                 Orientation Level: Person Current Attention Level: Focused Memory: Decreased short-term memory Following Commands: Follows one step commands inconsistently, Follows one step commands with increased time Safety/Judgement: Decreased awareness of deficits Awareness: Intellectual Problem Solving: Slow processing, Difficulty sequencing, Requires verbal cues, Requires tactile cues       General Comments   HR to 107    Exercises     Shoulder Instructions      Home Living Family/patient expects to be discharged to:: Private residence Living Arrangements: Alone Available Help at Discharge: Family;Friend(s);Neighbor;Available PRN/intermittently Type of Home: House Home Access: Stairs to enter Entrance Stairs-Number of Steps: 1 small threshold Entrance Stairs-Rails: None Home Layout: One level     Bathroom Shower/Tub: Walk-in shower;Tub/shower unit   Bathroom Toilet: Standard Bathroom Accessibility: Yes How Accessible: Accessible via walker Home Equipment: Cane - single point;Grab bars - tub/shower;BSC/3in1;Rollator (4 wheels);Wheelchair - manual          Prior Functioning/Environment Prior Level of Function : Independent/Modified Independent             Mobility Comments: States she uses 4WRW, does not drive. ADLs Comments: States she is Mod I for bathing and dressing, light meal prep. manages medication, and performs light iADL        OT Problem List: Decreased strength;Decreased range of motion;Decreased activity tolerance;Impaired balance (sitting and/or standing);Impaired sensation;Impaired UE functional use;Decreased safety awareness;Decreased cognition      OT Treatment/Interventions: Self-care/ADL training;Therapeutic activities;Patient/family education;Balance training;DME and/or AE instruction    OT  Goals(Current goals can be found in the care plan section) Acute Rehab OT Goals OT Goal Formulation: Patient unable to participate in goal setting Time For Goal Achievement: 01/14/23 Potential to Achieve Goals: Fair  OT Frequency: Min 1X/week    Co-evaluation              AM-PAC OT "6 Clicks" Daily Activity     Outcome Measure Help from another person eating meals?: A Lot Help from another person taking care of personal grooming?: A Lot Help from another person toileting, which includes using toliet, bedpan, or urinal?: A Lot Help from another person bathing (including washing, rinsing, drying)?: A Lot Help from another person to put on and taking off regular upper body clothing?: A Lot Help from another person to put on and taking off regular lower body clothing?: Total 6 Click Score: 11   End of Session Nurse Communication: Mobility status  Activity Tolerance: Patient tolerated treatment well Patient left: in bed;with call bell/phone within reach  OT Visit Diagnosis: Muscle weakness (generalized) (M62.81);History of falling (Z91.81);Other symptoms and signs involving cognitive function                Time: 1340-1402 OT Time Calculation (min): 22 min Charges:  OT General Charges $OT Visit: 1 Visit OT Evaluation $OT Eval Moderate Complexity: 1 Mod  12/31/2022  RP, OTR/L  Acute Rehabilitation Services  Office:  [phone]   Suzanna Obey 12/31/2022, 2:14 PM

## 2022-12-31 NOTE — Assessment & Plan Note (Signed)
Monitor CBC in the morning

## 2022-12-31 NOTE — Consult Note (Signed)
[facility] Neuro-Oncology Consult Note  Patient Care Team: Porfirio Oar, [place] as PCP - General (Family Medicine) Ilda Mori, MD as Consulting Physician (Obstetrics and Gynecology) Barbaraann Cao Georgeanna Lea, MD as Consulting Physician (Hematology and Oncology)  CHIEF COMPLAINTS/PURPOSE OF CONSULTATION:  Pleomorphic Xanthoastrocytoma Breakthrough Seizures  HISTORY OF PRESENTING ILLNESS:  Lindsey Daniels 67 y.o. female presented with breakthrough seizures at her care facility.  Etiology is not well documented, apparently there was an event yesterday, prolonged, and possibly additional episode today.  This morning she was increasingly confused and weak on the left side, similar to prior seizures.  Should be compliant with Keppra 1500mg  BID and Lamictal 100mg  BID.  MEDICAL HISTORY:  Past Medical History:  Diagnosis Date   Allergy    Anal fissure    Anxiety    Cataract    Depression    Elevated LFTs    Hyperglycemia    Hyperlipidemia    Hypertension    Osteoarthritis    Pars defect of lumbar spine    Seizures (HCC)    Spondylolisthesis    lumbar    SURGICAL HISTORY: Past Surgical History:  Procedure Laterality Date   cataract     CRANIOTOMY N/A 10/02/2013   Procedure: 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;  Surgeon: Hewitt Shorts, MD;  Location: [facility];  Service: Neurosurgery;  Laterality: N/A;  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   CRANIOTOMY Right 11/29/2020   Procedure: Right Parietal craniotomy for tumor resection;  Surgeon: Coletta Memos, MD;  Location: [facility] OR;  Service: Neurosurgery;  Laterality: Right;   HEEL SPUR SURGERY      SOCIAL HISTORY: Social History   Socioeconomic History   Marital status: Divorced    Spouse name: separated-no papers file   Number of children: 0   Years of education: Not on file   Highest education level: Not on file  Occupational History   Occupation: RT    Employer: [employer] @  [place]    Comment: [employer] and [employer]  Tobacco Use   Smoking status: Never   Smokeless tobacco: Never  Substance and Sexual Activity   Alcohol use: Yes    Alcohol/week: 0.0 - 4.0 standard drinks of alcohol    Comment: social   Drug use: No   Sexual activity: Not Currently    Partners: Male  Other Topics Concern   Not on file  Social History Narrative   Separated from second husband.  Very contentious split.   Social Determinants of Health   Financial Resource Strain: Low Risk  (01/15/2022)   Overall Financial Resource Strain (CARDIA)    Difficulty of Paying Living Expenses: Not hard at all  Food Insecurity: Patient Unable To Answer (12/31/2022)   Hunger Vital Sign    Worried About Programme researcher, broadcasting/film/video in the Last Year: Patient unable to answer    Ran Out of Food in the Last Year: Patient unable to answer  Transportation Needs: Patient Unable To Answer (12/31/2022)   PRAPARE - Transportation    Lack of Transportation (Medical): Patient unable to answer    Lack of Transportation (Non-Medical): Patient unable to answer  Physical Activity: Inactive (02/03/2021)   Received from [facility], [facility]   Exercise Vital Sign    Days of Exercise per Week: 0 days    Minutes of Exercise per Session: 0 min  Stress: Stress Concern Present (02/03/2021)   Received from [facility], [facility]  [employer] of Occupational Health - Occupational Stress Questionnaire    Feeling of Stress : To some extent  Social Connections: Unknown (06/16/2021)   Received from [facility], [facility]   Social Network    Social Network: Not on file  Intimate Partner Violence: Patient Unable To Answer (12/31/2022)   Humiliation, Afraid, Rape, and Kick questionnaire    Fear of Current or Ex-Partner: Patient unable to answer    Emotionally Abused: Patient unable to answer    Physically Abused: Patient unable to answer    Sexually Abused: Patient unable to  answer    FAMILY HISTORY: Family History  Problem Relation Age of Onset   Mental illness Father        depression   COPD Father    Alcohol abuse Brother    Breast cancer Neg Hx     ALLERGIES:  is allergic to rifapentine, amoxicillin, and clavulanic acid.  MEDICATIONS:  Current Facility-Administered Medications  Medication Dose Route Frequency Provider Last Rate Last Admin   [START ON 01/01/2023] atorvastatin (LIPITOR) tablet 20 mg  20 mg Oral Daily Alfredo Martinez, MD       Melene Muller ON 01/01/2023] pantoprazole (PROTONIX) EC tablet 40 mg  40 mg Oral Daily Alfredo Martinez, MD       Melene Muller ON 01/01/2023] sertraline (ZOLOFT) tablet 150 mg  150 mg Oral Daily Alfredo Martinez, MD       Current Outpatient Medications  Medication Sig Dispense Refill   amLODipine (NORVASC) 10 MG tablet Take 1 tablet (10 mg total) by mouth daily.     apixaban (ELIQUIS) 5 MG TABS tablet Take 1 tablet (5 mg total) by mouth 2 (two) times daily. 60 tablet 2   atorvastatin (LIPITOR) 20 MG tablet Take 1 tablet (20 mg total) by mouth daily. 90 tablet 3   cetirizine (ZYRTEC) 10 MG tablet Take 10 mg by mouth daily.     dexamethasone (DECADRON) 4 MG tablet Take 1 tablet (4 mg total) by mouth daily.     fluticasone (FLONASE) 50 MCG/ACT nasal spray Place 1-2 sprays into both nostrils daily. (Patient taking differently: Place 2 sprays into both nostrils daily.) 16 g 0   lamoTRIgine (LAMICTAL) 100 MG tablet Take 1 tablet (100 mg total) by mouth daily. 60 tablet 1   levETIRAcetam (KEPPRA) 750 MG tablet Take 2 tablets (1,500 mg total) by mouth 2 (two) times daily. 120 tablet 1   losartan (COZAAR) 50 MG tablet Take 1 tablet (50 mg total) by mouth daily.     melatonin 5 MG TABS Take 5 mg by mouth at bedtime.     Midazolam (NAYZILAM) 5 MG/0.1ML SOLN Place 5 mg into the nose every 6 (six) hours as needed (prolonged seizure, greater than 5 minutes). 1 each 0   Multiple Vitamin (MULTIVITAMIN WITH MINERALS) TABS tablet Take 1 tablet by  mouth daily.     omeprazole (PRILOSEC) 20 MG capsule Take 20 mg by mouth daily.     sertraline (ZOLOFT) 100 MG tablet Take 1 tablet (100 mg total) by mouth daily. 90 tablet 3   sertraline (ZOLOFT) 50 MG tablet Take 50 mg by mouth daily.     lamoTRIgine (LAMICTAL) 25 MG tablet Take 1 tablet (25 mg total) by mouth daily for 7 days, THEN 2 tablets (50 mg total) daily for 7 days. (Patient not taking: Reported on 12/31/2022) 21 tablet 0    REVIEW OF SYSTEMS:   Constitutional: Denies fevers, chills or abnormal weight loss Eyes: Denies blurriness of vision  Ears, nose, mouth, throat, and face: Denies mucositis or sore throat Respiratory: Denies cough, dyspnea or wheezes Cardiovascular: Denies palpitation, chest discomfort or lower extremity swelling Gastrointestinal:  Denies nausea, constipation, diarrhea GU: Denies dysuria or incontinence Skin: Denies abnormal skin rashes Neurological: Per HPI Musculoskeletal: Denies joint pain, back or neck discomfort. No decrease in ROM Behavioral/Psych: Denies anxiety, disturbance in thought content, and mood instability   PHYSICAL EXAMINATION: Vitals:   12/31/22 1244 12/31/22 1503  BP:  (!) 142/77  Pulse:  100  Resp: 17 (!) 21  Temp: 97.9 F (36.6 C)   SpO2:  96%   KPS: 60. General: Alert, cooperative, pleasant, in no acute distress Head: Normal EENT: No conjunctival injection or scleral icterus. Oral mucosa moist Lungs: Resp effort normal Cardiac: Regular rate and rhythm Abdomen: Soft, non-distended abdomen Skin: No rashes cyanosis or petechiae. Extremities: No clubbing or edema  NEUROLOGIC EXAM: Mental Status: Drowsy, alert, attentive to examiner with conversation. Oriented to self and environment. Language is fluent with intact comprehension.  Displays some disorganized thinking. Age advanced psychomotor slowing. Cranial Nerves: Visual acuity is grossly normal. Visual fields are full. Extra-ocular movements intact. No ptosis. Face is  symmetric Motor: Tone and bulk are normal. Power is 3/5 in left arm, 3/5 in left leg. Reflexes are symmetric, no pathologic reflexes present.  Sensory: Intact to light touch Gait: Deferred   LABORATORY DATA:  I have reviewed the data as listed Lab Results  Component Value Date   WBC 6.6 12/31/2022   HGB 14.3 12/31/2022   HCT 42.0 12/31/2022   MCV 90.5 12/31/2022   PLT 132 (L) 12/31/2022   Recent Labs    01/18/22 0405 01/18/22 0720 08/20/22 2316 08/20/22 2321 12/02/22 1224 12/02/22 1245 12/31/22 0849 12/31/22 0850  NA UNSATISFACTORY SPECIMEN. CLIENT REQUESTS SPECIMEN TO BE ANALYZED.   < > 136   < > 140 139 130* 132*  K UNSATISFACTORY SPECIMEN. CLIENT REQUESTS SPECIMEN TO BE ANALYZED.   < > 3.1*   < > 4.6 3.9 3.7 3.8  CL UNSATISFACTORY SPECIMEN. CLIENT REQUESTS SPECIMEN TO BE ANALYZED.   < > 104   < > 104 103 95* 96*  CO2 UNSATISFACTORY SPECIMEN. CLIENT REQUESTS SPECIMEN TO BE ANALYZED.   < > 23  --  23  --  26  --   GLUCOSE UNSATISFACTORY SPECIMEN. CLIENT REQUESTS SPECIMEN TO BE ANALYZED.   < > 113*   < > 100* 97 116* 117*  BUN UNSATISFACTORY SPECIMEN. CLIENT REQUESTS SPECIMEN TO BE ANALYZED.   < > 7*   < > 8 8 15 20   CREATININE UNSATISFACTORY SPECIMEN. CLIENT REQUESTS SPECIMEN TO BE ANALYZED.   < > 0.64   < > 0.60 0.50 0.86 0.70  CALCIUM UNSATISFACTORY SPECIMEN. CLIENT REQUESTS SPECIMEN TO BE ANALYZED.   < > 8.6*  --  9.6  --  8.8*  --   GFRNONAA UNSATISFACTORY SPECIMEN. CLIENT REQUESTS SPECIMEN TO BE ANALYZED.   < > >60  --  >60  --  >60  --   GFRAA UNSATISFACTORY SPECIMEN. CLIENT REQUESTS SPECIMEN TO BE ANALYZED.  --   --   --   --   --   --   --   PROT UNSATISFACTORY SPECIMEN. CLIENT REQUESTS SPECIMEN TO BE ANALYZED.   < > 5.9*  --  6.4*  --  5.8*  --   ALBUMIN UNSATISFACTORY SPECIMEN. CLIENT REQUESTS SPECIMEN TO BE ANALYZED.   < > 3.2*  --  3.4*  --  3.2*  --  AST UNSATISFACTORY SPECIMEN. CLIENT REQUESTS SPECIMEN TO BE ANALYZED.   < > 104*  --  39  --  26  --   ALT  UNSATISFACTORY SPECIMEN. CLIENT REQUESTS SPECIMEN TO BE ANALYZED.   < > 41  --  17  --  23  --   ALKPHOS UNSATISFACTORY SPECIMEN. CLIENT REQUESTS SPECIMEN TO BE ANALYZED.   < > 84  --  104  --  93  --   BILITOT UNSATISFACTORY SPECIMEN. CLIENT REQUESTS SPECIMEN TO BE ANALYZED.   < > 0.8  --  0.7  --  1.9*  --    < > = values in this interval not displayed.    RADIOGRAPHIC STUDIES: I have personally reviewed the radiological images as listed and agreed with the findings in the report. MR ANGIO HEAD WO CONTRAST  Result Date: 12/31/2022 CLINICAL DATA:  Neuro deficit with acute stroke suspected EXAM: MRI HEAD WITHOUT AND WITH CONTRAST MRA HEAD WITHOUT CONTRAST TECHNIQUE: Multiplanar, multi-echo pulse sequences of the brain and surrounding structures were acquired without and with intravenous contrast. Angiographic images of the Circle of Willis were acquired using MRA technique without intravenous contrast. CONTRAST:  6mL GADAVIST GADOBUTROL 1 MMOL/ML IV SOLN COMPARISON:  Head CT from earlier the same day FINDINGS: MRI HEAD FINDINGS Brain: Necrotic type enhancement with nonacute blood products which is extensive in the right temporal lobe and extending to the periatrial/parietal white matter. Based on spin echo sequences, the extent and thickness of the enhancement is unchanged, irregular shape diff eyes reproducible measurement and there is no sagittal postcontrast images. Ependymal thickening at the right lateral ventricle at and adjacent to the areas of mass contact is non progressed. Extensive T2 hyperintensity in the adjacent white matter with localized swelling, also extending into the posterior right thalamus. The degree of edema/T2 signal is diminished with resolved mass effect and midline shift. No shift or herniation. Vascular: Normal flow voids and vascular enhancements. Skull and upper cervical spine: Unremarkable right temporal craniotomy. Sinuses/Orbits: Chronic left maxillary sinusitis with  mucosal thickening and central fluid. MRA HEAD FINDINGS Significant motion artifact at the level of the skull base. The carotid, vertebral, and basilar arteries are widely patent. No branch occlusion, beading, or aneurysm. IMPRESSION: 1. Unchanged extensive necrotic type enhancement the patient's right temporoparietal mass when compared to 12/02/2022. Related T2 hyperintensity and swelling is diminished with essentially resolved mass effect. 2. Negative, motion degraded MRA. Electronically Signed   By: Tiburcio Pea M.D.   On: 12/31/2022 11:00   MR BRAIN W WO CONTRAST  Result Date: 12/31/2022 CLINICAL DATA:  Neuro deficit with acute stroke suspected EXAM: MRI HEAD WITHOUT AND WITH CONTRAST MRA HEAD WITHOUT CONTRAST TECHNIQUE: Multiplanar, multi-echo pulse sequences of the brain and surrounding structures were acquired without and with intravenous contrast. Angiographic images of the Circle of Willis were acquired using MRA technique without intravenous contrast. CONTRAST:  6mL GADAVIST GADOBUTROL 1 MMOL/ML IV SOLN COMPARISON:  Head CT from earlier the same day FINDINGS: MRI HEAD FINDINGS Brain: Necrotic type enhancement with nonacute blood products which is extensive in the right temporal lobe and extending to the periatrial/parietal white matter. Based on spin echo sequences, the extent and thickness of the enhancement is unchanged, irregular shape diff eyes reproducible measurement and there is no sagittal postcontrast images. Ependymal thickening at the right lateral ventricle at and adjacent to the areas of mass contact is non progressed. Extensive T2 hyperintensity in the adjacent white matter with localized swelling, also extending into the posterior  right thalamus. The degree of edema/T2 signal is diminished with resolved mass effect and midline shift. No shift or herniation. Vascular: Normal flow voids and vascular enhancements. Skull and upper cervical spine: Unremarkable right temporal craniotomy.  Sinuses/Orbits: Chronic left maxillary sinusitis with mucosal thickening and central fluid. MRA HEAD FINDINGS Significant motion artifact at the level of the skull base. The carotid, vertebral, and basilar arteries are widely patent. No branch occlusion, beading, or aneurysm. IMPRESSION: 1. Unchanged extensive necrotic type enhancement the patient's right temporoparietal mass when compared to 12/02/2022. Related T2 hyperintensity and swelling is diminished with essentially resolved mass effect. 2. Negative, motion degraded MRA. Electronically Signed   By: Tiburcio Pea M.D.   On: 12/31/2022 11:00   CT Cervical Spine Wo Contrast  Result Date: 12/31/2022 CLINICAL DATA:  Polytrauma, blunt EXAM: CT CERVICAL SPINE WITHOUT CONTRAST TECHNIQUE: Multidetector CT imaging of the cervical spine was performed without intravenous contrast. Multiplanar CT image reconstructions were also generated. RADIATION DOSE REDUCTION: This exam was performed according to the departmental dose-optimization program which includes automated exposure control, adjustment of the mA and/or kV according to patient size and/or use of iterative reconstruction technique. COMPARISON:  None Available. FINDINGS: Alignment: No substantial sagittal subluxation. Skull base and vertebrae: Vertebral body heights are maintained. No evidence of acute fracture. Soft tissues and spinal canal: No prevertebral fluid or swelling. No visible canal hematoma. Disc levels:  Mild-to-moderate multilevel degenerative change. Upper chest: Visualized lung apices are clear. IMPRESSION: No evidence of acute fracture or traumatic malalignment. Electronically Signed   By: Feliberto Harts M.D.   On: 12/31/2022 09:30   CT HEAD CODE STROKE WO CONTRAST  Result Date: 12/31/2022 CLINICAL DATA:  Code stroke.  Neuro deficit, acute, stroke suspected EXAM: CT HEAD WITHOUT CONTRAST TECHNIQUE: Contiguous axial images were obtained from the base of the skull through the vertex  without intravenous contrast. RADIATION DOSE REDUCTION: This exam was performed according to the departmental dose-optimization program which includes automated exposure control, adjustment of the mA and/or kV according to patient size and/or use of iterative reconstruction technique. COMPARISON:  None Available. FINDINGS: Brain: Redemonstrated extensive edema involving the right frontal, parietal temporal lobes with areas of mineralization, compatible with malignancy. Mass effect does appear improved with approximately 1 mm of leftward midline shift. No evidence of acute superimposed large vascular territory infarct or hemorrhage. Vascular: No hyperdense vessel identified. Calcific atherosclerosis. Skull: No acute fracture. Sinuses/Orbits: Left maxillary sinus opacification. ASPECTS [facility] Stroke Program Early CT Score) total score (0-10 with 10 being normal): 10. IMPRESSION: 1. 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 Please see forthcoming MRI for further evaluation. 2. No evidence acute superimposed large vascular territory infarct or hemorrhage. Electronically Signed   By: Feliberto Harts M.D.   On: 12/31/2022 09:12   NM PET Metabolic Brain  Result Date: 12/28/2022 CLINICAL DATA:  Brain tumor status post resection. Enhancing tissue within the resection site. EXAM: NM PET METABOLIC BRAIN TECHNIQUE: 9.9 mCi F-18 FDG was injected intravenously. Full-ring PET imaging was performed from the vertex to skull base. CT data was obtained and used for attenuation correction and anatomic localization. FASTING BLOOD GLUCOSE:  Value: 9 144 mg/dl COMPARISON:  Brain MRI 12/02/2022, 07/09/2022 FINDINGS: In general there is cortical hypo metabolism within the RIGHT temporoparietal lobe related to treatment effect and edema. There is a focus of intense metabolic activity with lateral aspect of the RIGHT temporal lobe beneath the craniotomy flap (image 50). This corresponds to a  region of  enhancement on comparison MRI. There is more broad enhancement anterior and posterior without significant radiotracer activity IMPRESSION: Focus intense radiotracer activity in the lateral RIGHT temporal lobe beneath craniotomy flap which corresponds to enhancing tissue on comparison MRI. Findings concerning for tumor recurrence. Electronically Signed   By: Genevive Bi M.D.   On: 12/28/2022 16:25   MR Brain W and Wo Contrast  Result Date: 12/02/2022 CLINICAL DATA:  Pleomorphic xanthoastrocytoma, assess treatment response EXAM: MRI HEAD WITHOUT AND WITH CONTRAST TECHNIQUE: Multiplanar, multiecho pulse sequences of the brain and surrounding structures were obtained without and with intravenous contrast. CONTRAST:  6mL GADAVIST GADOBUTROL 1 MMOL/ML IV SOLN COMPARISON:  07/09/2022 FINDINGS: Evaluation is somewhat limited by motion artifact. Brain: Status post right craniotomy with subjacent resection cavity in the right temporal lobe. Overall increased abnormal enhancement about the resection cavity, which is irregular and ill-defined but measures approximately 7.3 x 4.2 x 4.5 cm (AP x TR x CC) (series 11, image 30 and series 13, image 13), previously 4.8 x 3.5 x 3.1 cm when remeasured similarly. Surrounding T2 hyperintense signal has increased, most notable in the right frontal and parietal lobes (series 7, image 25), with increased T2 signal also noted in the right aspect the midbrain and pons (series 7, images 12-14. Increased mass effect on the body of right lateral ventricle with 6 mm of right to left midline shift, new from the prior. There is also increased mass effect on the right occipital and temporal horns, which are effaced. No acute hemorrhage or extra-axial collection. No evidence of acute infarct. Vascular: Normal arterial flow voids. Normal arterial and venous enhancement. Skull and upper cervical spine: Right craniotomy. Otherwise normal marrow signal. Sinuses/Orbits: Chronic left  maxillary sinusitis. Mild mucosal thickening in the ethmoid air cells. Status post bilateral lens replacements. Other: Fluid in right mastoid air cells. IMPRESSION: Evaluation is somewhat limited by motion artifact. Within this limitation, there is increased abnormal enhancement about the right temporal resection cavity, with increased surrounding edema, concerning for disease progression. Increased mass effect on the right lateral ventricle. 6 mm of right to left midline shift is new from the prior exam. Electronically Signed   By: Wiliam Ke M.D.   On: 12/02/2022 19:39    ASSESSMENT & PLAN:  PXA Focal Seizures  Lindsey Daniels presents with breakthrough seizure(s) without apparent provocation.  This is despite recently initiating a second AED, Lamictal.  No reason to suspect med compliance issue.  She is not terribly far off her baseline right now, with some increase in cognitive impairment and left sided weakness as post-ictal phenomena.    We reviewed MRI as well as recent FDG-PET study.  The overall picture is complicated, but we can be confident of progression of disease at this time, with FDG avid region in the left lateral temporal lobe supportive of such.  There may be concurrent radiation necrosis which is residual.   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 The Lancet Oncology, Volume 23, Issue 1, 53 - 64   We will discuss case in tumor board and reach out to oral chemotherapy pharmacist to arrange for this treatment once stabilized, discharged, consented.  Would otherwise be reasonable to increase Lamictal to 150mg  BID, keep Keppra 1500mg  BID, decadron  4mg  daily.  We will make arrangement to follow up  shortly after discharge.  All questions were answered. The patient knows to call the clinic with any problems, questions or concerns.  The total time spent in the encounter was 60 minutes and more than 50% was on counseling and review of test results     Henreitta Leber, MD 12/31/2022 4:48 PM

## 2022-12-31 NOTE — Plan of Care (Signed)
Called patients brothers per his request and pt gave verbal permission to discuss her health. Updated him on how she is doing, the plan per the H&P and answered all his questions. He requests we call him tomorrow morning with updates. He is concerned because she was supposed to have a PET scan today out patient with her oncologist Dr. Barbaraann Cao. I will reach out to Dr. Barbaraann Cao to let him know she is here.

## 2022-12-31 NOTE — ED Provider Notes (Signed)
[facility] AT [facility] Provider Note   CSN: [id number] Arrival date & time: 12/31/22  0840  An emergency department physician performed an initial assessment on this suspected stroke patient at [phone].  History  Chief Complaint  Patient presents with   Code Stroke   Level II trauma    Lindsey Daniels is a 67 y.o. 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  This morning seen normal by staff at 0500 but shortly afterwards noted to have left facial droop, left arm and leg weakness.  HPI     Home Medications Prior to Admission medications   Medication Sig Start Date End Date Taking? Authorizing Provider  amLODipine (NORVASC) 10 MG tablet Take 1 tablet (10 mg total) by mouth daily. 10/06/21  Yes Meredeth Ide, MD  apixaban (ELIQUIS) 5 MG TABS tablet Take 1 tablet (5 mg total) by mouth 2 (two) times daily. 11/18/21  Yes Vaslow, Georgeanna Lea, MD  atorvastatin (LIPITOR) 20 MG tablet Take 1 tablet (20 mg total) by mouth daily. 07/25/17  Yes Jeffery, Avelino Leeds, PA  cetirizine (ZYRTEC) 10 MG tablet Take 10 mg by mouth daily.   Yes [provider]  dexamethasone (DECADRON) 4 MG tablet Take 1 tablet (4 mg total) by mouth daily. 12/10/22  Yes Vaslow, Georgeanna Lea, MD  fluticasone (FLONASE) 50 MCG/ACT nasal spray Place 1-2 sprays into both nostrils daily. Patient taking differently: Place 2 sprays into both nostrils daily. 11/06/20  Yes Wieters, Hallie C, PA-C  lamoTRIgine (LAMICTAL) 100 MG tablet Take 1 tablet (100 mg total) by mouth daily. 12/25/22  Yes Vaslow, Georgeanna Lea, MD  levETIRAcetam (KEPPRA) 750 MG tablet Take 2 tablets (1,500 mg total) by mouth 2 (two) times daily. 12/02/22  Yes Laurence Spates, MD  losartan (COZAAR) 50 MG tablet Take  1 tablet (50 mg total) by mouth daily. 10/06/21  Yes Lama, Sarina Ill, MD  melatonin 5 MG TABS Take 5 mg by mouth at bedtime.   Yes [provider]  Midazolam (NAYZILAM) 5 MG/0.1ML SOLN Place 5 mg into the nose every 6 (six) hours as needed (prolonged seizure, greater than 5 minutes). 12/10/22  Yes Vaslow, Georgeanna Lea, MD  Multiple Vitamin (MULTIVITAMIN WITH MINERALS) TABS tablet Take 1 tablet by mouth daily.   Yes [provider]  omeprazole (PRILOSEC) 20 MG capsule Take 20 mg by mouth daily.   Yes [provider]  sertraline (ZOLOFT) 100 MG tablet Take 1 tablet (100 mg total) by mouth daily. 06/30/17  Yes Jeffery, Chelle, PA  sertraline (ZOLOFT) 50 MG tablet Take 50 mg by mouth daily.   Yes [provider]  lamoTRIgine (LAMICTAL) 25 MG tablet Take 1 tablet (25 mg total) by mouth daily for 7 days, THEN 2 tablets (50 mg total) daily for 7 days. Patient not taking: Reported on 12/31/2022 12/11/22 12/25/22  Henreitta Leber, MD      Allergies    Rifapentine, Amoxicillin, and Clavulanic acid    Review of Systems   Review of Systems  Physical Exam Updated Vital Signs BP (!) 123/93   Pulse (!) 111   Temp 98 F (36.7 C) (Oral)   Resp 16   Ht 5\' 3"  (1.6 m)   Wt 65.8 kg   SpO2 96%   BMI  25.70 kg/m  Physical Exam  ED Results / Procedures / Treatments   Labs (all labs ordered are listed, but only abnormal results are displayed) Labs Reviewed  CBC - Abnormal; Notable for the following components:      Result Value   Platelets 132 (*)    All other components within normal limits  COMPREHENSIVE METABOLIC PANEL - Abnormal; Notable for the following components:   Sodium 130 (*)    Chloride 95 (*)    Glucose, Bld 116 (*)    Calcium 8.8 (*)    Total Protein 5.8 (*)    Albumin 3.2 (*)    Total Bilirubin 1.9 (*)    All other components within normal limits  I-STAT CHEM 8, ED - Abnormal; Notable for the following components:   Sodium 132 (*)    Chloride 96  (*)    Glucose, Bld 117 (*)    Calcium, Ion 0.98 (*)    All other components within normal limits  CBG MONITORING, ED - Abnormal; Notable for the following components:   Glucose-Capillary 124 (*)    All other components within normal limits  ETHANOL  PROTIME-INR  APTT  DIFFERENTIAL  RAPID URINE DRUG SCREEN, HOSP PERFORMED  URINALYSIS, ROUTINE W REFLEX MICROSCOPIC  I-STAT CHEM 8, ED  I-STAT CG4 LACTIC ACID, ED  SAMPLE TO BLOOD BANK    EKG None  Radiology CT Cervical Spine Wo Contrast  Result Date: 12/31/2022 CLINICAL DATA:  Polytrauma, blunt EXAM: CT CERVICAL SPINE WITHOUT CONTRAST TECHNIQUE: Multidetector CT imaging of the cervical spine was performed without intravenous contrast. Multiplanar CT image reconstructions were also generated. RADIATION DOSE REDUCTION: This exam was performed according to the departmental dose-optimization program which includes automated exposure control, adjustment of the mA and/or kV according to patient size and/or use of iterative reconstruction technique. COMPARISON:  None Available. FINDINGS: Alignment: No substantial sagittal subluxation. Skull base and vertebrae: Vertebral body heights are maintained. No evidence of acute fracture. Soft tissues and spinal canal: No prevertebral fluid or swelling. No visible canal hematoma. Disc levels:  Mild-to-moderate multilevel degenerative change. Upper chest: Visualized lung apices are clear. IMPRESSION: No evidence of acute fracture or traumatic malalignment. Electronically Signed   By: Feliberto Harts M.D.   On: 12/31/2022 09:30   CT HEAD CODE STROKE WO CONTRAST  Result Date: 12/31/2022 CLINICAL DATA:  Code stroke.  Neuro deficit, acute, stroke suspected EXAM: CT HEAD WITHOUT CONTRAST TECHNIQUE: Contiguous axial images were obtained from the base of the skull through the vertex without intravenous contrast. RADIATION DOSE REDUCTION: This exam was performed according to the departmental dose-optimization  program which includes automated exposure control, adjustment of the mA and/or kV according to patient size and/or use of iterative reconstruction technique. COMPARISON:  None Available. FINDINGS: Brain: Redemonstrated extensive edema involving the right frontal, parietal temporal lobes with areas of mineralization, compatible with malignancy. Mass effect does appear improved with approximately 1 mm of leftward midline shift. No evidence of acute superimposed large vascular territory infarct or hemorrhage. Vascular: No hyperdense vessel identified. Calcific atherosclerosis. Skull: No acute fracture. Sinuses/Orbits: Left maxillary sinus opacification. ASPECTS [facility] Stroke Program Early CT Score) total score (0-10 with 10 being normal): 10. IMPRESSION: 1. 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 Please see forthcoming MRI for further evaluation. 2. No evidence acute superimposed large vascular territory infarct or hemorrhage. Electronically Signed   By: Feliberto Harts M.D.   On: 12/31/2022 09:12    Procedures Procedures    Medications  Ordered in ED Medications  gadobutrol (GADAVIST) 1 MMOL/ML injection 6 mL (6 mLs Intravenous Contrast Given 12/31/22 1020)    ED Course/ Medical Decision Making/ A&P Clinical Course as of 12/31/22 1107  Thu Dec 31, 2022  1610 Neurologist DR Otelia Limes recommending medical admission for MRA and MRI brain, which has been ordered, and potential todd's paralysis monitoring and treatment.   [MT]  1003 Admitted to family medicine service pending final neuro recommendations after MRI, including whether need for EEG at this time [MT]    Clinical Course User Index [MT] Hae Ahlers, Kermit Balo, MD                                 Medical Decision Making Amount and/or Complexity of Data Reviewed Labs: ordered. Radiology: ordered.  Risk Decision regarding hospitalization.   This patient presents to the ED with concern for seizure,  left-sided weakness, head injury. This involves an extensive number of treatment options, and is a complaint that carries with it a high risk of complications and morbidity.  The differential diagnosis includes Todd's paralysis versus intracranial injury versus stroke versus other  Co-morbidities that complicate the patient evaluation: History of seizures, brain tumor and resection, and high risk of breakthrough seizures and complications.  Anticoagulation use.  Additional history obtained from EMS  External records from outside source obtained and reviewed including neurosurgery evaluation from 2022 including partial brain resection  I ordered and personally interpreted labs.  The pertinent results include: Mild hyponatremia with sodium 130, Cr wnl  I ordered imaging studies including CT imaging of the head and cervical spine I independently visualized and interpreted imaging which showed no emergent findings I agree with the radiologist interpretation  The patient was maintained on a cardiac monitor.  I personally viewed and interpreted the cardiac monitored which showed an underlying rhythm of: Sinus rhythm and mild sinus tachycardia  Per my interpretation the patient's ECG shows left bundle branch block, sinus rhythm, some baseline wander but no evident ischemic findings  I have reviewed the patients home medicines and have made adjustments as needed  Test Considered: Low suspicion for acute PE, or meningitis.  No indication for lumbar puncture at this time.  I requested consultation with the neurology,  and discussed lab and imaging findings as well as pertinent plan - they recommend: Medical admission for MRI imaging, potential EEG, monitoring of possible post ictal state  After the interventions noted above, I reevaluated the patient and found that they have: stayed the same   Dispostion:  After consideration of the diagnostic results and the patients response to treatment, I feel  that the patent would benefit from medical admission.         Final Clinical Impression(s) / ED Diagnoses Final diagnoses:  Left-sided weakness  Hyponatremia  Thrombocytopenia (HCC)    Rx / DC Orders ED Discharge Orders     None         Terald Sleeper, MD 12/31/22 1110

## 2022-12-31 NOTE — ED Notes (Signed)
ED TO INPATIENT HANDOFF REPORT  ED Nurse Name and Phone #:  Marily Konczal 67  S Name/Age/Gender Lindsey Daniels Payer 67 y.o. female Room/Bed: 041C/041C  Code Status   Code Status: Do not attempt resuscitation (DNR) PRE-ARREST INTERVENTIONS DESIRED  Home/SNF/Other Nursing Home Patient oriented to: self, place, time, and situation Is this baseline? Yes   Triage Complete: Triage complete  Chief Complaint Seizure [facility]) [R56.9]  Triage Note Last night had a fall. Hit head against tile flooring. After an hour or 2, pt had a seizure episode. Hx of epilepsy. Takes Eliquis. Lives in [place]. Woke up and shortly, slurred speech and left sided facial droop and left sided weakness noted. Code stroke and Level 2 trauma activated   Allergies Allergies  Allergen Reactions   Rifapentine Other (See Comments)    Flu-like symptoms   Amoxicillin Rash   Clavulanic Acid Rash    Level of Care/Admitting Diagnosis ED Disposition     ED Disposition  Admit   Condition  --   Comment  Hospital Area[facility] [[id number]]  Level of Care: Telemetry Medical [104]  May admit patient to [facility] or [facility] if equivalent level of care is available:: Yes  Covid Evaluation: Asymptomatic - no recent exposure (last 10 days) testing not required  Diagnosis: Seizure [facility]) [[id number]]  Admitting Physician: Alfredo Martinez [[id number]]  Attending Physician: Doreene Eland [[id number]]  Certification:: I certify this patient will need inpatient services for at least 2 midnights  Expected Medical Readiness: 01/03/2023          B Medical/Surgery History Past Medical History:  Diagnosis Date   Allergy    Anal fissure    Anxiety    Cataract    Depression    Elevated LFTs    Hyperglycemia    Hyperlipidemia    Hypertension    Osteoarthritis    Pars defect of lumbar spine    Seizures (HCC)    Spondylolisthesis    lumbar   Past Surgical History:  Procedure Laterality Date    cataract     CRANIOTOMY N/A 10/02/2013   Procedure: 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;  Surgeon: Hewitt Shorts, MD;  Location: [facility];  Service: Neurosurgery;  Laterality: N/A;  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   CRANIOTOMY Right 11/29/2020   Procedure: Right Parietal craniotomy for tumor resection;  Surgeon: Coletta Memos, MD;  [facility];  Service: Neurosurgery;  Laterality: Right;   HEEL SPUR SURGERY       A IV Location/Drains/Wounds Patient Lines/Drains/Airways Status     Active Line/Drains/Airways     Name Placement date Placement time Site Days   Peripheral IV 12/31/22 18 G Right Antecubital 12/31/22  0846  Antecubital  less than 1            Intake/Output Last 24 hours No intake or output data in the 24 hours ending 12/31/22 1607  Labs/Imaging Results for orders placed or performed during the hospital encounter of 12/31/22 (from the past 48 hour(s))  CBG monitoring, ED     Status: Abnormal   Collection Time: 12/31/22  8:41 AM  Result Value Ref Range   Glucose-Capillary 124 (H) 70 - 99 mg/dL    Comment: Glucose reference range applies only to samples taken after fasting for at least 8 hours.  Ethanol     Status: None   Collection Time: 12/31/22  8:49 AM  Result Value Ref Range   Alcohol, Ethyl (  B) <10 <10 mg/dL    Comment: (NOTE) Lowest detectable limit for serum alcohol is 10 mg/dL.  For medical purposes only. Performed at [facility], [address], [place], [place] [address]   Protime-INR     Status: None   Collection Time: 12/31/22  8:49 AM  Result Value Ref Range   Prothrombin Time 14.2 11.4 - 15.2 seconds   INR 1.1 0.8 - 1.2    Comment: (NOTE) INR goal varies based on device and disease states. Performed at [facility], [address], [place], [place] [address]   APTT     Status: None   Collection Time: 12/31/22  8:49 AM  Result Value Ref Range   aPTT 25 24 - 36 seconds    Comment:  Performed at [facility], [address] [place], [place] [address]  CBC     Status: Abnormal   Collection Time: 12/31/22  8:49 AM  Result Value Ref Range   WBC 6.6 4.0 - 10.5 K/uL   RBC 4.72 3.87 - 5.11 MIL/uL   Hemoglobin 13.9 12.0 - 15.0 g/dL   HCT 53.6 64.4 - 03.4 %   MCV 90.5 80.0 - 100.0 fL   MCH 29.4 26.0 - 34.0 pg   MCHC 32.6 30.0 - 36.0 g/dL   RDW 74.2 59.5 - 63.8 %   Platelets 132 (L) 150 - 400 K/uL   nRBC 0.0 0.0 - 0.2 %    Comment: Performed at [facility], [address] [place], [place] [address]  Differential     Status: None   Collection Time: 12/31/22  8:49 AM  Result Value Ref Range   Neutrophils Relative % 68 %   Neutro Abs 4.5 1.7 - 7.7 K/uL   Lymphocytes Relative 17 %   Lymphs Abs 1.1 0.7 - 4.0 K/uL   Monocytes Relative 11 %   Monocytes Absolute 0.8 0.1 - 1.0 K/uL   Eosinophils Relative 2 %   Eosinophils Absolute 0.1 0.0 - 0.5 K/uL   Basophils Relative 1 %   Basophils Absolute 0.0 0.0 - 0.1 K/uL   Immature Granulocytes 1 %   Abs Immature Granulocytes 0.07 0.00 - 0.07 K/uL    Comment: Performed at [facility], [address], [place], [place] [address]  Comprehensive metabolic panel     Status: Abnormal   Collection Time: 12/31/22  8:49 AM  Result Value Ref Range   Sodium 130 (L) 135 - 145 mmol/L   Potassium 3.7 3.5 - 5.1 mmol/L   Chloride 95 (L) 98 - 111 mmol/L   CO2 26 22 - 32 mmol/L   Glucose, Bld 116 (H) 70 - 99 mg/dL    Comment: Glucose reference range applies only to samples taken after fasting for at least 8 hours.   BUN 15 8 - 23 mg/dL   Creatinine, Ser 8.84 0.44 - 1.00 mg/dL   Calcium 8.8 (L) 8.9 - 10.3 mg/dL   Total Protein 5.8 (L) 6.5 - 8.1 g/dL   Albumin 3.2 (L) 3.5 - 5.0 g/dL   AST 26 15 - 41 U/L   ALT 23 0 - 44 U/L   Alkaline Phosphatase 93 38 - 126 U/L   Total Bilirubin 1.9 (H) <1.2 mg/dL   GFR, Estimated >16 >60 mL/min    Comment: (NOTE) Calculated using the CKD-EPI Creatinine Equation (2021)    Anion gap  9 5 - 15    Comment: Performed at [facility]  Lab, [address], [place], [place] [address]  Sample to Blood Bank     Status: None   Collection Time: 12/31/22  8:49 AM  Result Value Ref Range   Blood Bank Specimen SAMPLE AVAILABLE FOR TESTING    Sample Expiration      01/03/2023,2359 Performed at [facility], [address] [place], [place] [address]   I-stat chem 8, ED     Status: Abnormal   Collection Time: 12/31/22  8:50 AM  Result Value Ref Range   Sodium 132 (L) 135 - 145 mmol/L   Potassium 3.8 3.5 - 5.1 mmol/L   Chloride 96 (L) 98 - 111 mmol/L   BUN 20 8 - 23 mg/dL   Creatinine, Ser 9.52 0.44 - 1.00 mg/dL   Glucose, Bld 841 (H) 70 - 99 mg/dL    Comment: Glucose reference range applies only to samples taken after fasting for at least 8 hours.   Calcium, Ion 0.98 (L) 1.15 - 1.40 mmol/L   TCO2 29 22 - 32 mmol/L   Hemoglobin 14.3 12.0 - 15.0 g/dL   HCT 32.4 40.1 - 02.7 %  I-Stat Lactic Acid, ED     Status: None   Collection Time: 12/31/22  8:50 AM  Result Value Ref Range   Lactic Acid, Venous 1.7 0.5 - 1.9 mmol/L   MR ANGIO HEAD WO CONTRAST  Result Date: 12/31/2022 CLINICAL DATA:  Neuro deficit with acute stroke suspected EXAM: MRI HEAD WITHOUT AND WITH CONTRAST MRA HEAD WITHOUT CONTRAST TECHNIQUE: Multiplanar, multi-echo pulse sequences of the brain and surrounding structures were acquired without and with intravenous contrast. Angiographic images of the Circle of Willis were acquired using MRA technique without intravenous contrast. CONTRAST:  6mL GADAVIST GADOBUTROL 1 MMOL/ML IV SOLN COMPARISON:  Head CT from earlier the same day FINDINGS: MRI HEAD FINDINGS Brain: Necrotic type enhancement with nonacute blood products which is extensive in the right temporal lobe and extending to the periatrial/parietal white matter. Based on spin echo sequences, the extent and thickness of the enhancement is unchanged, irregular shape diff eyes reproducible measurement and there is  no sagittal postcontrast images. Ependymal thickening at the right lateral ventricle at and adjacent to the areas of mass contact is non progressed. Extensive T2 hyperintensity in the adjacent white matter with localized swelling, also extending into the posterior right thalamus. The degree of edema/T2 signal is diminished with resolved mass effect and midline shift. No shift or herniation. Vascular: Normal flow voids and vascular enhancements. Skull and upper cervical spine: Unremarkable right temporal craniotomy. Sinuses/Orbits: Chronic left maxillary sinusitis with mucosal thickening and central fluid. MRA HEAD FINDINGS Significant motion artifact at the level of the skull base. The carotid, vertebral, and basilar arteries are widely patent. No branch occlusion, beading, or aneurysm. IMPRESSION: 1. Unchanged extensive necrotic type enhancement the patient's right temporoparietal mass when compared to 12/02/2022. Related T2 hyperintensity and swelling is diminished with essentially resolved mass effect. 2. Negative, motion degraded MRA. Electronically Signed   By: Tiburcio Pea M.D.   On: 12/31/2022 11:00   MR BRAIN W WO CONTRAST  Result Date: 12/31/2022 CLINICAL DATA:  Neuro deficit with acute stroke suspected EXAM: MRI HEAD WITHOUT AND WITH CONTRAST MRA HEAD WITHOUT CONTRAST TECHNIQUE: Multiplanar, multi-echo pulse sequences of the brain and surrounding structures were acquired without and with intravenous contrast. Angiographic images of the Circle of Willis were acquired using MRA technique without intravenous contrast. CONTRAST:  6mL GADAVIST GADOBUTROL 1 MMOL/ML IV SOLN COMPARISON:  Head  CT from earlier the same day FINDINGS: MRI HEAD FINDINGS Brain: Necrotic type enhancement with nonacute blood products which is extensive in the right temporal lobe and extending to the periatrial/parietal white matter. Based on spin echo sequences, the extent and thickness of the enhancement is unchanged, irregular  shape diff eyes reproducible measurement and there is no sagittal postcontrast images. Ependymal thickening at the right lateral ventricle at and adjacent to the areas of mass contact is non progressed. Extensive T2 hyperintensity in the adjacent white matter with localized swelling, also extending into the posterior right thalamus. The degree of edema/T2 signal is diminished with resolved mass effect and midline shift. No shift or herniation. Vascular: Normal flow voids and vascular enhancements. Skull and upper cervical spine: Unremarkable right temporal craniotomy. Sinuses/Orbits: Chronic left maxillary sinusitis with mucosal thickening and central fluid. MRA HEAD FINDINGS Significant motion artifact at the level of the skull base. The carotid, vertebral, and basilar arteries are widely patent. No branch occlusion, beading, or aneurysm. IMPRESSION: 1. Unchanged extensive necrotic type enhancement the patient's right temporoparietal mass when compared to 12/02/2022. Related T2 hyperintensity and swelling is diminished with essentially resolved mass effect. 2. Negative, motion degraded MRA. Electronically Signed   By: Tiburcio Pea M.D.   On: 12/31/2022 11:00   CT Cervical Spine Wo Contrast  Result Date: 12/31/2022 CLINICAL DATA:  Polytrauma, blunt EXAM: CT CERVICAL SPINE WITHOUT CONTRAST TECHNIQUE: Multidetector CT imaging of the cervical spine was performed without intravenous contrast. Multiplanar CT image reconstructions were also generated. RADIATION DOSE REDUCTION: This exam was performed according to the departmental dose-optimization program which includes automated exposure control, adjustment of the mA and/or kV according to patient size and/or use of iterative reconstruction technique. COMPARISON:  None Available. FINDINGS: Alignment: No substantial sagittal subluxation. Skull base and vertebrae: Vertebral body heights are maintained. No evidence of acute fracture. Soft tissues and spinal canal:  No prevertebral fluid or swelling. No visible canal hematoma. Disc levels:  Mild-to-moderate multilevel degenerative change. Upper chest: Visualized lung apices are clear. IMPRESSION: No evidence of acute fracture or traumatic malalignment. Electronically Signed   By: Feliberto Harts M.D.   On: 12/31/2022 09:30   CT HEAD CODE STROKE WO CONTRAST  Result Date: 12/31/2022 CLINICAL DATA:  Code stroke.  Neuro deficit, acute, stroke suspected EXAM: CT HEAD WITHOUT CONTRAST TECHNIQUE: Contiguous axial images were obtained from the base of the skull through the vertex without intravenous contrast. RADIATION DOSE REDUCTION: This exam was performed according to the departmental dose-optimization program which includes automated exposure control, adjustment of the mA and/or kV according to patient size and/or use of iterative reconstruction technique. COMPARISON:  None Available. FINDINGS: Brain: Redemonstrated extensive edema involving the right frontal, parietal temporal lobes with areas of mineralization, compatible with malignancy. Mass effect does appear improved with approximately 1 mm of leftward midline shift. No evidence of acute superimposed large vascular territory infarct or hemorrhage. Vascular: No hyperdense vessel identified. Calcific atherosclerosis. Skull: No acute fracture. Sinuses/Orbits: Left maxillary sinus opacification. ASPECTS [facility] Stroke Program Early CT Score) total score (0-10 with 10 being normal): 10. IMPRESSION: 1. 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 Please see forthcoming MRI for further evaluation. 2. No evidence acute superimposed large vascular territory infarct or hemorrhage. Electronically Signed   By: Feliberto Harts M.D.   On: 12/31/2022 09:12    Pending Labs Unresulted Labs (From admission, onward)     Start     Ordered   01/01/23 0500  CBC  Daily,   R      12/31/22 1036   01/01/23 0500  Basic metabolic panel  Daily,   R       12/31/22 1036   12/31/22 1700  Basic metabolic panel  Once,   R        12/31/22 1224   12/31/22 0841  Urine rapid drug screen (hosp performed)  Once,   STAT        12/31/22 0840   12/31/22 0841  Urinalysis, Routine w reflex microscopic -Urine, Clean Catch  Once,   URGENT       Question:  Specimen Source  Answer:  Urine, Clean Catch   12/31/22 0840            Vitals/Pain Today's Vitals   12/31/22 1030 12/31/22 1100 12/31/22 1244 12/31/22 1503  BP: (!) 123/93 (!) 132/99  (!) 142/77  Pulse: (!) 111 100  100  Resp: 16 20 17  (!) 21  Temp:   97.9 F (36.6 C)   TempSrc:   Oral   SpO2: 96% 100%  96%  Weight:      Height:      PainSc:        Isolation Precautions No active isolations  Medications Medications  atorvastatin (LIPITOR) tablet 20 mg (has no administration in time range)  pantoprazole (PROTONIX) EC tablet 40 mg (has no administration in time range)  sertraline (ZOLOFT) tablet 150 mg (has no administration in time range)  gadobutrol (GADAVIST) 1 MMOL/ML injection 6 mL (6 mLs Intravenous Contrast Given 12/31/22 1020)    Mobility walks with device     Focused Assessments Neuro Assessment Handoff:  Swallow screen pass? Yes  Cardiac Rhythm: Normal sinus rhythm NIH Stroke Scale  Dizziness Present: No Headache Present: No Interval: Other (Comment) Level of Consciousness (1a.)   : Alert, keenly responsive LOC Questions (1b. )   : Answers one question correctly LOC Commands (1c. )   : Performs both tasks correctly Best Gaze (2. )  : Normal Visual (3. )  : Complete hemianopia Facial Palsy (4. )    : Minor paralysis Motor Arm, Left (5a. )   : No effort against gravity Motor Arm, Right (5b. ) : No drift Motor Leg, Left (6a. )  : No effort against gravity Motor Leg, Right (6b. ) : Drift Limb Ataxia (7. ): Absent Sensory (8. )  : Severe to total sensory loss, patient is not aware of being touched in the face, arm, and leg Best Language (9. )  : No  aphasia Dysarthria (10. ): Normal Extinction/Inattention (11.)   : Visual/tactile/auditory/spatial/personal inattention Complete NIHSS TOTAL: 14 Last date known well: 12/31/22 Last time known well: 0500 Neuro Assessment:   Neuro Checks:   Initial (12/31/22 0900)  Has TPA been given? No If patient is a Neuro Trauma and patient is going to OR before floor call report to 4N Charge nurse: [phone] or [phone]   R Recommendations: See Admitting Provider Note  Report given to:   Additional Notes: Carmelina Balducci [phone]

## 2022-12-31 NOTE — Consult Note (Addendum)
NEUROLOGY CONSULT NOTE   Date of service: December 31, 2022 Patient Name: Lindsey Daniels MRN:  [id number] DOB:  04-25-1955 Chief Complaint: "Code stroke" Requesting Provider: Terald Sleeper, MD  History of Present Illness  Lindsey Daniels is a 67 y.o. female  has a past medical history of Allergy, Anal fissure, Anxiety, Cataract, Depression, Elevated LFTs, Hyperglycemia, Hyperlipidemia, Hypertension, Osteoarthritis, Pars defect of lumbar spine, Seizures (HCC), and Spondylolisthesis. 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 Per EMS she had a fall yesterday and a seizure, this am staff noticed her to have left side weakness and left facial droop. LKW 0500. Code stroke was activated by EMS. NIHSS 15. CT head with no acute process.   MRI brain w/wo with no acute process MRA brain pending   Per chart review she has been having breakthrough seizures and her Keppra was increased to 1500mg  BID last month. She was also started on decadron at that time. Lamictal was started at this time as well and she is currently on 100 mg BID. She is followed by her Neurooncologist Dr. Barbaraann Cao outpatient.   Multiple attempts were made to contact nursing facility and was not able to get through as the phone kept ringing with no answer. Based on information from the chart as well as EMS, there apparently also was a prolonged event yesterday followed by another episode today with increasing confusion and left sided weakness, similar to prior seizures.   Code stroke cancelled by Dr. Otelia Limes.    LKW: 0500 Modified rankin score: 4-Needs assistance to walk and tend to bodily needs IV Thrombolysis: No, presentation most consistent with breakthrough seizure EVT: No LVO   ROS  Comprehensive ROS performed and pertinent positives documented in HPI   Past History   Past Medical  History:  Diagnosis Date   Allergy    Anal fissure    Anxiety    Cataract    Depression    Elevated LFTs    Hyperglycemia    Hyperlipidemia    Hypertension    Osteoarthritis    Pars defect of lumbar spine    Seizures (HCC)    Spondylolisthesis    lumbar  Right temporal pleomorphic xanthoastrocytoma  Past Surgical History:  Procedure Laterality Date   cataract     CRANIOTOMY N/A 10/02/2013   Procedure: 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;  Surgeon: Hewitt Shorts, MD;  Location: [facility];  Service: Neurosurgery;  Laterality: N/A;  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   CRANIOTOMY Right 11/29/2020   Procedure: Right Parietal craniotomy for tumor resection;  Surgeon: Coletta Memos, MD;  Location: [facility] OR;  Service: Neurosurgery;  Laterality: Right;   HEEL SPUR SURGERY      Family History: Family History  Problem Relation Age of Onset   Mental illness Father        depression   COPD Father    Alcohol abuse Brother    Breast cancer Neg Hx     Social History  reports that she has never smoked. She has never used smokeless tobacco. She reports current alcohol use. She reports that she does not use drugs.  Allergies  Allergen Reactions   Rifapentine Other (See Comments)    Flu-like symptoms   Amoxicillin Rash   Clavulanic Acid Rash    Medications  No current facility-administered medications for this encounter.  Current Outpatient Medications:    amLODipine (NORVASC) 10 MG tablet, Take 1 tablet (10 mg total) by mouth daily., Disp: , Rfl:    apixaban (ELIQUIS) 5 MG TABS tablet, Take 1 tablet (5 mg total) by mouth 2 (two) times daily., Disp: 60 tablet, Rfl: 2   atorvastatin (LIPITOR) 20 MG tablet, Take 1 tablet (20 mg total) by mouth daily., Disp: 90 tablet, Rfl: 3   cetirizine (ZYRTEC) 10 MG tablet, Take 10 mg by mouth daily., Disp: , Rfl:    cholecalciferol (VITAMIN D) 1000 units tablet, Take 1,000 Units by mouth daily., Disp: , Rfl:     dexamethasone (DECADRON) 4 MG tablet, Take 1 tablet (4 mg total) by mouth daily., Disp: , Rfl:    fluticasone (FLONASE) 50 MCG/ACT nasal spray, Place 1-2 sprays into both nostrils daily. (Patient taking differently: Place 2 sprays into both nostrils daily.), Disp: 16 g, Rfl: 0   lamoTRIgine (LAMICTAL) 100 MG tablet, Take 1 tablet (100 mg total) by mouth daily., Disp: 60 tablet, Rfl: 1   lamoTRIgine (LAMICTAL) 25 MG tablet, Take 1 tablet (25 mg total) by mouth daily for 7 days, THEN 2 tablets (50 mg total) daily for 7 days., Disp: 21 tablet, Rfl: 0   levETIRAcetam (KEPPRA) 750 MG tablet, Take 2 tablets (1,500 mg total) by mouth 2 (two) times daily., Disp: 120 tablet, Rfl: 1   losartan (COZAAR) 50 MG tablet, Take 1 tablet (50 mg total) by mouth daily., Disp: , Rfl:    melatonin 5 MG TABS, Take 5 mg by mouth at bedtime., Disp: , Rfl:    Midazolam (NAYZILAM) 5 MG/0.1ML SOLN, Place 5 mg into the nose every 6 (six) hours as needed (prolonged seizure, greater than 5 minutes)., Disp: 1 each, Rfl: 0   Multiple Vitamin (MULTIVITAMIN WITH MINERALS) TABS tablet, Take 1 tablet by mouth daily., Disp: , Rfl:    omeprazole (PRILOSEC) 20 MG capsule, Take 20 mg by mouth daily., Disp: , Rfl:    sertraline (ZOLOFT) 100 MG tablet, Take 1 tablet (100 mg total) by mouth daily., Disp: 90 tablet, Rfl: 3   sertraline (ZOLOFT) 50 MG tablet, Take 50 mg by mouth daily., Disp: , Rfl:   Vitals   Vitals:   12/31/22 0800 12/31/22 0849 12/31/22 0851  BP:   127/80  Pulse:   98  Resp:   15  SpO2:   96%  Weight: 65.8 kg    Height: 5\' 3"  (1.6 m) 5\' 3"  (1.6 m)     Body mass index is 25.7 kg/m.  Physical Exam   Constitutional: Appears well-developed and well-nourished.  Psych: Affect appropriate to situation.  Eyes: No scleral injection.  HENT: No OP obstruction.  Head: Normocephalic.  Cardiovascular: Normal rate and regular rhythm.  Respiratory: Effort normal, non-labored breathing.  GI: Soft.  No distension. There is  no tenderness.  Skin: WDI.   Neurologic Examination   Mental Status -  Awake and alert. Confused. Orientation to self, but not to time or place. Unable to recall her correct age or the month.  Speech is fluent. Able to follow commands. Naming intact.  Cranial Nerves II - XII - II - Visual fields intact OU . III, IV, VI - Extraocular movements intact . V - Facial sensation intact bilaterally . VII - Left facial droop VIII - Hearing intact to voice . X - Palate elevates symmetrically . XI - Chin turning & shoulder shrug intact bilaterally . XII - Tongue protrusion intact .  Motor Strength -  The patient's strength was normal in right arm with mild drift to right leg. Left arm and left leg not able to lift antigravity, but has horizontal movement along the bed.    Bulk was normal and fasciculations were absent Reflexes - hyperreflexia noted to left biceps and left patellar, 2+ on right biceps and patellar Sensory - decreased FT to left arm and left leg. Positive for sensory extinction on the left.  Cerebellar - No ataxia or dysmetria out of proportion to her left sided weakness. Intact on the right. Tremor was absent . Gait and Station - Deferred.  NIHSS 1a Level of Conscious.: 0 1b LOC Questions: 2 1c LOC Commands: 0 2 Best Gaze: 0 3 Visual: 2 4 Facial Palsy: 1 5a Motor Arm - left: 3 5b Motor Arm - Right: 0 6a Motor Leg - Left: 3 6b Motor Leg - Right: 1 7 Limb Ataxia: 0 8 Sensory: 2 9 Best Language: 0 10 Dysarthria: 0 11 Extinct. and Inatten.: 1 TOTAL: 15  Labs/Imaging/Neurodiagnostic studies   CBC:  Recent Labs  Lab 01/19/23 0850  HGB 14.3  HCT 42.0    Basic Metabolic Panel:  Lab Results  Component Value Date   NA 132 (L) January 19, 2023   K 3.8 2023/01/19   CO2 23 12/02/2022   GLUCOSE 117 (H) 01/19/2023   BUN 20 January 19, 2023   CREATININE 0.70 01-19-23   CALCIUM 9.6 12/02/2022   GFRNONAA >60 12/02/2022   GFRAA  01/18/2022    UNSATISFACTORY SPECIMEN. CLIENT  REQUESTS SPECIMEN TO BE ANALYZED.    Lipid Panel:  Lab Results  Component Value Date   LDLCALC 177 (H) 06/30/2017    HgbA1c:  Lab Results  Component Value Date   HGBA1C 5.3 01/21/2022    Urine Drug Screen:     Component Value Date/Time   LABOPIA NONE DETECTED 01/18/2022 0325   COCAINSCRNUR NONE DETECTED 01/18/2022 0325   LABBENZ POSITIVE (A) 01/18/2022 0325   AMPHETMU NONE DETECTED 01/18/2022 0325   THCU NONE DETECTED 01/18/2022 0325   LABBARB NONE DETECTED 01/18/2022 0325     Alcohol Level     Component Value Date/Time   ETH <10 01/13/2022 1359    INR  Lab Results  Component Value Date   INR 1.3 (H) 08/20/2022    APTT  Lab Results  Component Value Date   APTT 113 (H) 01/25/2022    AED levels:  Lab Results  Component Value Date   LEVETIRACETA 29.4 01/17/2022      CT Head without contrast(Personally reviewed): no acute process, Aspects 10. Redemonstrated known malignancy on the right    MR Angio head without contrast and Carotid Duplex BL(Personally reviewed): Official read pending   MRI Brain(Personally reviewed): With no acute process -official read pending viewed by Dr. Otelia Limes    ASSESSMENT  Lindsey Daniels is a 67 y.o. female  has a past medical history of Allergy, Anal fissure, Anxiety, Cataract, Depression, Elevated LFTs, Hyperglycemia, Hyperlipidemia, Hypertension, Osteoarthritis, Pars defect of lumbar spine, Seizures (HCC), and Spondylolisthesis., PE (on Eliquis) and right temporal pleomorphic xanthoastrocytoma s/p crani  in 2015 and 2022 s/p chemo/rad who presents with  acute left side weakness from her nursing facility. Per EMS she had a fall yesterday and a seizure, this am staff noticed her to have left side weakness and left facial droop. LKW 0500. She is followed by her Neurooncologist Dr. Barbaraann Cao outpatient.  - Exam reveals left facial droop, left sided weakness and left sided sensory numbness in addition to left  sided extinction to DSS.   - CT head: No acute process, Aspects 10. 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 Stroke is felt to be unlikely, but will rule out with MRI/MRA   RECOMMENDATIONS  - STAT MRI brain w/wo - STAT MRA brain   Addendum - MRI/MRA preliminary review reveals no acute infarction or LVO - Canceling code stroke  - EEG if her weakness does not improve or if she has another seizure - Continue Keppra at 1500 mg BID (ordered) - Increase Lamictal to 150 mg BID (ordered).  - Continue Decadron at 4 mg po every day (ordered)   Addendum: - MRI/MRA brain official Radiology interpretation: Unchanged extensive necrotic type enhancement the patient's right temporoparietal mass when compared to 12/02/2022. Related T2 hyperintensity and swelling is diminished with essentially resolved mass effect. Negative, motion degraded MRA. - No acute intracranial bleed. Chronic petechial hemorrhages in the bed of her tumor are not a contraindication to anticoagulation. Benefits for DVT/PE prevention outweigh risks. May resume her home Eliquis (ordered) - Will need outpatient follow up with Dr. Barbaraann Cao - Neurology will re-examine in the AM  ______________________________________________________________________    Mathews Argyle, NP [facility]  I have seen and examined the patient. I have formulated the assessment and recommendations. 67 year old female with a history of right temporal pleomorphic xanthoastrocytoma s/p crani  in 2015 and 2022 s/p chemo/rad who presents with  acute left side weakness from her nursing facility. Per EMS she had a fall yesterday and a seizure, this am staff noticed her to have left side weakness and left facial droop. Exam reveals left facial droop, left sided weakness and left sided sensory numbness in addition to left  sided extinction to DSS. 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 Recommendations as above.  Electronically signed: Dr. Caryl Pina

## 2022-12-31 NOTE — Plan of Care (Signed)
FMTS Brief Progress Note  S: Evaluated patient at bedside with Dr. Elliot Gurney.  Patient states that she had a seizure yesterday and her chronic left-sided weakness got worse afterwards.  Denies any pain currently.  Full history limited secondary to disorganized speech.    O: BP 124/74 (BP Location: Right Arm)   Pulse (!) 104   Temp 99.6 F (37.6 C) (Axillary)   Resp 20   Ht 5\' 3"  (1.6 m)   Wt 65.8 kg   SpO2 96%   BMI 25.70 kg/m   Neuro: Thought content appears disorganized however she is alert and oriented to person place and situation.  Able to follow commands.  5 out of 5 strength right upper extremity, 1 out of 5 strength left upper extremity.  Unable to raise LLE, normal mobility of RLE. EOMI and PERRL. Pt unable to correctly name numbers of fingers we are holding up 2/3 times but states she normally wears glasses.   A/P: Acute on chronic left sided weakness 2/2 breakthrough seizure in setting of brain tumor -Home seizure regimen Keppra 1500 mg twice daily and Lamictal 100 mg daily -Neurology has been consulted, appreciate their recommendation -Rest of management per GYN team   - Orders reviewed. Labs for AM ordered, which was adjusted as needed.   Para March, DO 12/31/2022, 8:58 PM PGY-1, [facility] Night Resident  Please page [phone] with questions.

## 2022-12-31 NOTE — Evaluation (Signed)
Physical Therapy Evaluation Patient Details Name: Lindsey Daniels MRN: [id number] DOB: [birth year] Today's Date: 12/31/2022  History of Present Illness  Pt is a 67 y.o. female who presented 12/31/22 with slurred speech and L-sided weakness this morning. Per chart, pt had fall and prolonged seizure at her facility the night prior. CT head and MRI brain were negative for intracranial bleeding or acute intracranial pathology. Imaging showed stable right temporal mass with resolving brain edema. 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 PMHx: Right temporal pleomorphic xanthoastrocytoma s/p crani in 2015 and 2022 s/p chemo/rad, HTN, OA, seizures, spondylolisthesis, HLD, pars defect of lumbar spine   Clinical Impression  Pt presents with condition above and deficits mentioned below, see PT Problem List. Pt is a poor historian with tangential speech currently. No family present to provide PLOF or home info. Per chart, pt was residing at [facility], but it is unclear if pt was in the ILF or ALF portion. She reported to the OT earlier that she was mod I utilizing a rollator for functional mobility at baseline. Currently, pt displays L-sided weakness along with inattention and deficits in balance, activity tolerance, and cognition. Upon arrival, pt was lying perpendicular in bed with her head resting on the R bed rail and her legs slipping through the holds of the L bed rail, which places her at risk for injury. RN made aware. Suggested seizure precaution pads for her bed. This PT readjusted the pt and assisted her to sitting EOB. Pt required modA to transition supine to sit, maxA to transition sit to supine, min-modA to roll, and modA to transfer to stand from edge of stretcher. Pt was unable to take steps along EOB when cued this date. Pt is at high risk for falls and injuries. She could benefit from inpatient rehab, < 3 hours/day. Will continue to follow acutely.        If plan is discharge home, recommend  the following: Two people to help with walking and/or transfers;A lot of help with bathing/dressing/bathroom;Assistance with cooking/housework;Direct supervision/assist for medications management;Direct supervision/assist for financial management;Assist for transportation;Help with stairs or ramp for entrance   Can travel by private vehicle   No    Equipment Recommendations Other (comment) (defer to next venue of care)  Recommendations for Other Services       Functional Status Assessment Patient has had a recent decline in their functional status and demonstrates the ability to make significant improvements in function in a reasonable and predictable amount of time.     Precautions / Restrictions Precautions Precautions: Fall Precaution Comments: watch HR Restrictions Weight Bearing Restrictions: No      Mobility  Bed Mobility Overal bed mobility: Needs Assistance Bed Mobility: Rolling, Supine to Sit, Sit to Supine Rolling: Min assist, Used rails, Mod assist   Supine to sit: Mod assist, HOB elevated Sit to supine: Max assist   General bed mobility comments: Pt sideways in bed upon arrival and needed extensive assistance to reposition safely midline in bed. Pt pulled up on therapist with R UE to sit L EOB, modA at trunk and L extremities. MaxA needed to return pt safely to supine. Pt followed cues to grab rails to assist in rolling, needing min-modA for pericare and bed linen change    Transfers Overall transfer level: Needs assistance Equipment used: 1 person hand held assist Transfers: Sit to/from Stand Sit to Stand: Mod assist, From elevated surface           General transfer comment:  Pt able to extend her knees to assist in standing from edge of stretcher, but leans posteriorly and needs blocking and UE support along with modA for balance    Ambulation/Gait               General Gait Details: pt unable to take steps along EOB when cued  Stairs             Wheelchair Mobility     Tilt Bed    Modified Rankin (Stroke Patients Only) Modified Rankin (Stroke Patients Only) Pre-Morbid Rankin Score: Moderate disability Modified Rankin: Severe disability     Balance Overall balance assessment: Needs assistance Sitting-balance support: Single extremity supported, Feet supported Sitting balance-Leahy Scale: Poor Sitting balance - Comments: R UE support and minA to prevent pt from sliding off edge of stretcher as pt was sitting very close to the edge Postural control: Posterior lean Standing balance support: Bilateral upper extremity supported Standing balance-Leahy Scale: Poor Standing balance comment: Posterior lean noted, modA needed to maintain static standing balance with UE support and knees blocked                             Pertinent Vitals/Pain Pain Assessment Pain Assessment: Faces Faces Pain Scale: No hurt Pain Intervention(s): Monitored during session    Home Living Family/patient expects to be discharged to:: Assisted living (per chart, resides at [facility] [place] (unsure if in ILF or ALF portion)) Living Arrangements: Alone Available Help at Discharge: Family;Friend(s);Neighbor;Available PRN/intermittently Type of Home: House Home Access: Stairs to enter Entrance Stairs-Rails: None Entrance Stairs-Number of Steps: 1 small threshold   Home Layout: One level Home Equipment: Cane - single point;Grab bars - tub/shower;BSC/3in1;Rollator (4 wheels);Wheelchair - manual Additional Comments: info above from pt report during OT eval; per chart, pt resides at [facility] [place], but unsure if ILF or ALF section    Prior Function Prior Level of Function : Independent/Modified Independent;Patient poor historian/Family not available             Mobility Comments: States she uses [employer], does not drive. ADLs Comments: States she is Mod I for bathing and dressing, light meal prep. manages medication,  and performs light iADL     Extremity/Trunk Assessment   Upper Extremity Assessment Upper Extremity Assessment: Defer to OT evaluation LUE Deficits / Details: Minimal spontaneous AROM noted to hand and elbow.  Weak shoulder shrug. LUE Sensation: decreased light touch;decreased proprioception LUE Coordination: decreased fine motor;decreased gross motor    Lower Extremity Assessment Lower Extremity Assessment: LLE deficits/detail;Generalized weakness LLE Deficits / Details: L leg noted to be weaker than R with functional mobility. Pt able to lift leg against gravity in supine, but not as well as on her R. MMT score of 2+ hip flexion supine on L    Cervical / Trunk Assessment Cervical / Trunk Assessment: Kyphotic  Communication   Communication Communication: Difficulty following commands/understanding;Difficulty communicating thoughts/reduced clarity of speech Following commands: Follows one step commands inconsistently;Follows one step commands with increased time Cueing Techniques: Verbal cues;Tactile cues;Visual cues  Cognition Arousal: Alert Behavior During Therapy: Restless Overall Cognitive Status: No family/caregiver present to determine baseline cognitive functioning Area of Impairment: Attention, Following commands, Memory, Safety/judgement, Awareness, Problem solving                   Current Attention Level: Focused Memory: Decreased short-term memory, Decreased recall of precautions Following Commands: Follows one step commands inconsistently,  Follows one step commands with increased time Safety/Judgement: Decreased awareness of safety, Decreased awareness of deficits Awareness: Intellectual Problem Solving: Slow processing, Difficulty sequencing, Requires verbal cues, Requires tactile cues, Decreased initiation General Comments: Pt tangential in speech and repetitive. Needs frequent redirecting. Slow to process cues and follows ~75% of simple cues. Poor safety  awareness as pt was found perpendicular in bed with head on rail of bed and legs beginning to stick through holes of rail of bed on other side. Pt needed extensive assistance to reposition properly in bed for her safety        General Comments General comments (skin integrity, edema, etc.): HR up to 140s spontaneously, recovered with supine rest    Exercises     Assessment/Plan    PT Assessment Patient needs continued PT services  PT Problem List Decreased strength;Decreased activity tolerance;Decreased mobility;Decreased balance;Decreased cognition;Decreased coordination;Decreased knowledge of use of DME;Decreased safety awareness;Cardiopulmonary status limiting activity       PT Treatment Interventions DME instruction;Gait training;Therapeutic activities;Functional mobility training;Stair training;Therapeutic exercise;Balance training;Neuromuscular re-education;Cognitive remediation;Patient/family education    PT Goals (Current goals can be found in the Care Plan section)  Acute Rehab PT Goals Patient Stated Goal: to get dry PT Goal Formulation: With patient Time For Goal Achievement: 01/14/23 Potential to Achieve Goals: Fair    Frequency Min 1X/week     Co-evaluation               AM-PAC PT "6 Clicks" Mobility  Outcome Measure Help needed turning from your back to your side while in a flat bed without using bedrails?: A Lot Help needed moving from lying on your back to sitting on the side of a flat bed without using bedrails?: A Lot Help needed moving to and from a bed to a chair (including a wheelchair)?: Total Help needed standing up from a chair using your arms (e.g., wheelchair or bedside chair)?: A Lot Help needed to walk in hospital room?: Total Help needed climbing 3-5 steps with a railing? : Total 6 Click Score: 9    End of Session   Activity Tolerance: Patient tolerated treatment well Patient left: in bed;with call bell/phone within reach Nurse  Communication: Mobility status;Other (comment) (pt was sideways in bed upon arrival, placing herself at risk for injury as her head was on the bed rail and legs through the holes of the other bed rail) PT Visit Diagnosis: Unsteadiness on feet (R26.81);Other abnormalities of gait and mobility (R26.89);Muscle weakness (generalized) (M62.81);Difficulty in walking, not elsewhere classified (R26.2);Other symptoms and signs involving the nervous system (R29.898)    Time: 1308-6578 PT Time Calculation (min) (ACUTE ONLY): 19 min   Charges:   PT Evaluation $PT Eval Moderate Complexity: 1 Mod   PT General Charges $$ ACUTE PT VISIT: 1 Visit         Virgil Benedict, PT, DPT Acute Rehabilitation Services  Office: [phone]   Bettina Gavia 12/31/2022, 5:42 PM

## 2022-12-31 NOTE — Plan of Care (Signed)
  Problem: Clinical Measurements: Goal: Will remain free from infection Outcome: Progressing Goal: Diagnostic test results will improve Outcome: Progressing   Problem: Nutrition: Goal: Adequate nutrition will be maintained Outcome: Progressing

## 2022-12-31 NOTE — Hospital Course (Addendum)
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 Her hospital course is detailed below:  Left-sided weakness speech concerns Patient was at her nursing facility when she had a seizure (with history of seizure disorder and breakthrough seizures) day prior to coming into the ED.  At that time, seizure resolved and she was back at baseline.  However, the next morning, she had left-sided weakness that was worse than before and inability to speak prompting ED visit.  In the ED, they obtained a CT head that was negative and an MRI brain that showed no acute changes.  Neurology was consulted and recommended continuing home keppra 1500mg  BID and increasing lamictal to 150mg  BID. Also recommended restarting Eliquis. Oncology reviewed imaging and believes known brain neoplasm has progressed, agreed with neurology recommendations for AED. Per facility, pt was taking 100mg  lamictal daily prior to admission so dose was adjusted to 100mg  BID.  PET scan showed some disease progression of brain tumor, oncology thinks patient would be a good candidate for chemotherapy outpatient.  Echo was obtained in the hospital prior to starting chemo.  Other chronic conditions were medically managed with home medications and formulary alternatives as necessary (hyperlipidemia, GERD, depression, hypertension)  PCP Follow-up Recommendations: Normotensive during hospital stay, discontinued home blood pressure medication

## 2023-01-01 ENCOUNTER — Telehealth: Payer: Self-pay | Admitting: Pharmacist

## 2023-01-01 ENCOUNTER — Telehealth: Payer: Self-pay | Admitting: Pharmacy Technician

## 2023-01-01 ENCOUNTER — Other Ambulatory Visit: Payer: Self-pay | Admitting: Internal Medicine

## 2023-01-01 ENCOUNTER — Other Ambulatory Visit (HOSPITAL_COMMUNITY): Payer: Self-pay

## 2023-01-01 DIAGNOSIS — R531 Weakness: Secondary | ICD-10-CM | POA: Diagnosis not present

## 2023-01-01 DIAGNOSIS — E871 Hypo-osmolality and hyponatremia: Secondary | ICD-10-CM | POA: Diagnosis not present

## 2023-01-01 DIAGNOSIS — D696 Thrombocytopenia, unspecified: Secondary | ICD-10-CM | POA: Diagnosis not present

## 2023-01-01 DIAGNOSIS — C719 Malignant neoplasm of brain, unspecified: Secondary | ICD-10-CM

## 2023-01-01 DIAGNOSIS — R569 Unspecified convulsions: Secondary | ICD-10-CM | POA: Diagnosis not present

## 2023-01-01 LAB — URINALYSIS, ROUTINE W REFLEX MICROSCOPIC
Bilirubin Urine: NEGATIVE
Glucose, UA: NEGATIVE mg/dL
Hgb urine dipstick: NEGATIVE
Ketones, ur: NEGATIVE mg/dL
Leukocytes,Ua: NEGATIVE
Nitrite: NEGATIVE
Protein, ur: NEGATIVE mg/dL
Specific Gravity, Urine: 1.017 (ref 1.005–1.030)
pH: 7 (ref 5.0–8.0)

## 2023-01-01 LAB — CBC
HCT: 42 % (ref 36.0–46.0)
Hemoglobin: 13.9 g/dL (ref 12.0–15.0)
MCH: 30 pg (ref 26.0–34.0)
MCHC: 33.1 g/dL (ref 30.0–36.0)
MCV: 90.7 fL (ref 80.0–100.0)
Platelets: 166 10*3/uL (ref 150–400)
RBC: 4.63 MIL/uL (ref 3.87–5.11)
RDW: 15.3 % (ref 11.5–15.5)
WBC: 7.8 10*3/uL (ref 4.0–10.5)
nRBC: 0 % (ref 0.0–0.2)

## 2023-01-01 LAB — BASIC METABOLIC PANEL
Anion gap: 10 (ref 5–15)
BUN: 15 mg/dL (ref 8–23)
CO2: 22 mmol/L (ref 22–32)
Calcium: 8.9 mg/dL (ref 8.9–10.3)
Chloride: 101 mmol/L (ref 98–111)
Creatinine, Ser: 0.6 mg/dL (ref 0.44–1.00)
GFR, Estimated: >60 mL/min (ref >60)
Glucose, Bld: 105 mg/dL — ABNORMAL HIGH (ref 70–99)
Potassium: 3.9 mmol/L (ref 3.5–5.1)
Sodium: 133 mmol/L — ABNORMAL LOW (ref 135–145)

## 2023-01-01 LAB — RAPID URINE DRUG SCREEN, HOSP PERFORMED
Amphetamines: NOT DETECTED
Barbiturates: NOT DETECTED
Benzodiazepines: NOT DETECTED
Cocaine: NOT DETECTED
Opiates: NOT DETECTED
Tetrahydrocannabinol: NOT DETECTED

## 2023-01-01 MED ORDER — LAMOTRIGINE 100 MG PO TABS
100.0000 mg | ORAL_TABLET | Freq: Two times a day (BID) | ORAL | Status: DC
  Administered 2023-01-01 – 2023-01-02 (×2): 100 mg via ORAL
  Filled 2023-01-01 (×2): qty 1

## 2023-01-01 MED ORDER — DABRAFENIB MESYLATE 75 MG PO CAPS
150.0000 mg | ORAL_CAPSULE | Freq: Two times a day (BID) | ORAL | 1 refills | Status: DC
  Filled 2023-01-11: qty 120, 30d supply, fill #0

## 2023-01-01 MED ORDER — TRAMETINIB DIMETHYL SULFOXIDE 2 MG PO TABS
2.0000 mg | ORAL_TABLET | Freq: Every day | ORAL | 1 refills | Status: DC
  Filled 2023-01-11: qty 30, 30d supply, fill #0

## 2023-01-01 NOTE — Progress Notes (Addendum)
Pharmacy Follow-up  [facility] [place] and confirmed patient was taking Lamictal 100mg  daily PTA.  This also matched pharmacy fill records.  Noted neuro and oncs plan to increase Lamictal dose .  Based on secure chat with Dr. Barbaraann Cao, will adjust inpatient Lamictal dose to 100mg  BID.  [employer], Pharm.D., BCPS Clinical Pharmacist 01/01/2023 12:42 PM

## 2023-01-01 NOTE — Plan of Care (Signed)
Spoke with patient's brother on the phone, he confirmed her name and date of birth.  He would like regular updates with disposition planning, etc.  Notified him of my latest updates regarding patient's plan of care, let him know that day team will be in contact with him tomorrow for any additional updates.

## 2023-01-01 NOTE — Assessment & Plan Note (Addendum)
Witnessed seizure at facility yesterday with noted L sided weakness and facial droop afterwards. Pt on Keppra and Lamictal (recently added) outpatient with good adherence. 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 Somnolent on exam this morning, difficult to assess ongoing deficits. Appears that pt was taking Lamictal 100mg  daily PTA, was started on Lamictal 150 BID on admission. --reevaluate in PM to assess neurologic status --clarify Lamictal dose with facility, likely that increased dose contributing to somnolence this AM --Neurology recommendations as follows: --continue home Keppra 1500mg  BID --home lamictal increased to 150mg  BID --continue Decadron 4mg  daily --consider EEG if weakness does not improve or pt has another seizure --seizure precautions

## 2023-01-01 NOTE — TOC CM/SW Note (Signed)
Transition of Care [facility]) - Inpatient Brief Assessment   Patient Details  Name: Lindsey Daniels MRN: [id number] Date of Birth: 1955/04/23  Transition of Care [facility]) CM/SW Contact:    Baldemar Lenis, LCSW Phone Number: 01/01/2023, 3:56 PM   Clinical Narrative:    Patient from [facility], noting recommendations for SNF but oncology workup pending at this time, plan to discuss patient at tumor board. CSW to follow.    Transition of Care Asessment: Insurance and Status: Insurance coverage has been reviewed Patient has primary care physician: Yes Home environment has been reviewed: [facility] Prior level of function:: Needs assist Prior/Current Home Services: Current home services Social Determinants of Health Reivew: SDOH reviewed no interventions necessary Readmission risk has been reviewed: Yes Transition of care needs: transition of care needs identified, TOC will continue to follow

## 2023-01-01 NOTE — Discharge Summary (Shared)
Family Medicine Teaching [facility] Discharge Summary  Patient name: Lindsey Daniels Medical record number: [id number] Date of birth: 10-25-1955 Age: 67 y.o. Gender: female Date of Admission: 12/31/2022  Date of Discharge: 01/06/23 Admitting Physician: Alfredo Martinez, MD  Primary Care Provider: Porfirio Oar, PA Consultants: neurology, oncology  Indication for Hospitalization: seizure  Discharge Diagnoses/Problem List:  Principal Problem for Admission: seizure Other Problems addressed during stay:  Principal Problem:   Seizure [facility]) Active Problems:   Pleomorphic xanthoastrocytoma of brain [facility])   Left-sided weakness    Brief Hospital Course:  Lindsey Daniels is a 67 y.o.female with a history of seizure disorder, 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 Her hospital course is detailed below:  Left-sided weakness  speech concerns Patient was at her nursing facility when she had a seizure (with history of seizure disorder and breakthrough seizures) day prior to coming into the ED.  At that time, seizure resolved and she was back at baseline.  However, the next morning, she had left-sided weakness that was worse than before and inability to speak prompting ED visit.  In the ED, they obtained a CT head that was negative and an MRI brain that showed no acute changes.  Neurology was consulted and recommended continuing home keppra 1500mg  BID and increasing lamictal to 150mg  BID. Also recommended restarting Eliquis. Oncology reviewed imaging and believes known brain neoplasm has progressed, agreed with neurology recommendations for AED. Per facility, pt was taking 100mg  BID lamictal daily prior to admission so dose was adjusted to 150mg  BID.  PET scan showed some disease progression of brain tumor, oncology thinks  patient would be a good candidate for chemotherapy outpatient.  Echo was obtained in the hospital prior to starting chemo per oncology.   Other chronic conditions were medically managed with home medications and formulary alternatives as necessary (hyperlipidemia, GERD, depression, hypertension)  PCP Follow-up Recommendations: Normotensive during hospital stay, discontinued home blood pressure medication F/u on arrhythmias in setting of increased lamictal 3. Ensure follow up with neurology.  Disposition: SNF  Discharge Condition: stable  Discharge Exam:  Vitals:   01/06/23 0727 01/06/23 1125  BP: (!) 144/75 127/70  Pulse: 85 75  Resp: 18 17  Temp: 99.3 F (37.4 C) 98.3 F (36.8 C)  SpO2: 97% 95%   Physical Exam: per Dr. Rexene Alberts 01/06/23 General: no acute distress Respiratory: No increased work of breathing on room air Extremities: Able to shift up in bed mainly with right arm    Significant Procedures: none  Significant Labs and Imaging:  CBC    Component Value Date/Time   WBC 7.6 01/02/2023 0454   RBC 4.50 01/02/2023 0454   HGB 13.4 01/02/2023 0454   HGB 11.7 (L) 05/12/2022 1510   HGB 13.0 06/30/2017 1815   HCT 39.4 01/02/2023 0454   HCT 38.6 06/30/2017 1815   PLT 167 01/02/2023 0454   PLT 495 (H) 05/12/2022 1510   PLT 249 06/30/2017 1815   MCV 87.6 01/02/2023 0454   MCV 87 06/30/2017 1815   MCH 29.8 01/02/2023 0454   MCHC 34.0 01/02/2023 0454   RDW 14.8 01/02/2023 0454   RDW 14.0 06/30/2017 1815   LYMPHSABS 1.1 12/31/2022 0849   LYMPHSABS 2.0 06/30/2017 1815   MONOABS 0.8 12/31/2022 0849   EOSABS 0.1 12/31/2022 0849   EOSABS 0.2 06/30/2017 1815   BASOSABS 0.0 12/31/2022 0849  BASOSABS 0.0 06/30/2017 1815      Latest Ref Rng & Units 01/04/2023    4:56 AM 01/02/2023    4:54 AM 01/01/2023    4:35 AM  BMP  Glucose 70 - 99 mg/dL 87  829  562   BUN 8 - 23 mg/dL 13  20  15    Creatinine 0.44 - 1.00 mg/dL 1.30  8.65  7.84   Sodium 135 - 145 mmol/L 134   134  133   Potassium 3.5 - 5.1 mmol/L 3.4  3.7  3.9   Chloride 98 - 111 mmol/L 101  102  101   CO2 22 - 32 mmol/L 26  24  22    Calcium 8.9 - 10.3 mg/dL 8.8  9.3  8.9    MRA/MR Brain 12/31/22 IMPRESSION: 1. Unchanged extensive necrotic type enhancement the patient's right temporoparietal mass when compared to 12/02/2022. Related T2 hyperintensity and swelling is diminished with essentially resolved mass effect. 2. Negative, motion degraded MRA.  NM Pet Metabolic Brain Scan IMPRESSION: Focus intense radiotracer activity in the lateral RIGHT temporal lobe beneath craniotomy flap which corresponds to enhancing tissue on comparison MRI. Findings concerning for tumor recurrence.  Results/Tests Pending at Time of Discharge: none  Discharge Medications:  Allergies as of 01/06/2023       Reactions   Rifapentine Other (See Comments)   Flu-like symptoms   Amoxicillin Rash   Clavulanic Acid Rash        Medication List     STOP taking these medications    amLODipine 10 MG tablet Commonly known as: NORVASC   losartan 50 MG tablet Commonly known as: COZAAR   melatonin 5 MG Tabs       TAKE these medications    apixaban 5 MG Tabs tablet Commonly known as: ELIQUIS Take 1 tablet (5 mg total) by mouth 2 (two) times daily.   atorvastatin 20 MG tablet Commonly known as: LIPITOR Take 1 tablet (20 mg total) by mouth daily.   cetirizine 10 MG tablet Commonly known as: ZYRTEC Take 10 mg by mouth daily.   dabrafenib mesylate 75 MG capsule Commonly known as: TAFINLAR Take 2 capsules (150 mg total) by mouth 2 (two) times daily. Take on an empty stomach 1 hour before or 2 hours after meals.   dexamethasone 4 MG tablet Commonly known as: DECADRON Take 1 tablet (4 mg total) by mouth daily.   fluticasone 50 MCG/ACT nasal spray Commonly known as: FLONASE Place 1-2 sprays into both nostrils daily. What changed: how much to take   lamoTRIgine 150 MG tablet Commonly known as:  LAMICTAL Take 1 tablet (150 mg total) by mouth 2 (two) times daily. What changed:  medication strength See the new instructions. Another medication with the same name was removed. Continue taking this medication, and follow the directions you see here.   levETIRAcetam 750 MG tablet Commonly known as: KEPPRA Take 2 tablets (1,500 mg total) by mouth 2 (two) times daily.   multivitamin with minerals Tabs tablet Take 1 tablet by mouth daily.   Nayzilam 5 MG/0.1ML Soln Generic drug: Midazolam Place 5 mg into the nose every 6 (six) hours as needed (prolonged seizure, greater than 5 minutes).   omeprazole 20 MG capsule Commonly known as: PRILOSEC Take 20 mg by mouth daily.   sertraline 50 MG tablet Commonly known as: ZOLOFT Take 50 mg by mouth daily.   sertraline 100 MG tablet Commonly known as: ZOLOFT Take 1 tablet (100 mg total) by mouth daily.  trametinib dimethyl sulfoxide 2 MG tablet Commonly known as: MEKINIST Take 1 tablet (2 mg total) by mouth daily. Take 1 hour before or 2 hours after a meal. Store refrigerated in original container.        Discharge Instructions: Please refer to Patient Instructions section of EMR for full details.  Patient was counseled important signs and symptoms that should prompt return to medical care, changes in medications, dietary instructions, activity restrictions, and follow up appointments.   Follow-Up Appointments:  Contact information for after-discharge care     Destination     [facility] .   Service: Skilled Nursing Contact information: [address] [place] [address] [phone]                     Celine Mans, MD 01/06/2023, 12:58 PM PGY-2, [facility]

## 2023-01-01 NOTE — Plan of Care (Signed)

## 2023-01-01 NOTE — Assessment & Plan Note (Signed)
Progression of disease seen on MRI and FDG-PET study per oncology, considering initiating oral chemotherapy --oncology to arrange follow up after hospital discharge

## 2023-01-01 NOTE — Progress Notes (Addendum)
Daily Progress Note Intern Pager: [phone]  Patient name: Lindsey Daniels Medical record number: [id number] Date of birth: 10-17-1955 Age: 67 y.o. Gender: female  Primary Care Provider: Porfirio Oar, PA Consultants: oncology, neurology Code Status: DNR-I  Pt Overview and Major Events to Date:  11/14: admitted to [facility]  Assessment and Plan: Lindsey Daniels is a 67 year old female presenting with worsening L sided weakness and change in speech likely due to breakthrough seizures in the setting of known pleomorphic xanthoastrocytoma. AED regimen adjusted per neurology, oncology following and considering new chemo regimen to begin outpatient.  PMH: HLD, HTN, depression, PE on Eliquis Assessment & Plan Seizure [facility]) Witnessed seizure at facility yesterday with noted L sided weakness and facial droop afterwards. Pt on Keppra and Lamictal (recently added) outpatient with good adherence. 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 Somnolent on exam this morning, difficult to assess ongoing deficits. Appears that pt was taking Lamictal 100mg  daily PTA, was started on Lamictal 150 BID on admission. --reevaluate in PM to assess neurologic status --clarify Lamictal dose with facility, likely that increased dose contributing to somnolence this AM --Neurology recommendations as follows: --continue home Keppra 1500mg  BID --home lamictal increased to 150mg  BID --continue Decadron 4mg  daily --consider EEG if weakness does not improve or pt has another seizure --seizure precautions Left-sided weakness Code stroke cancelled as brain imaging negative for acute stroke or hemorrhage. Initial focal deficits likely due to Todd's paralysis per neurology vs mass effect of brain neoplasm (given persisted longer than expect with Todd's paralysis). Chronic petechial hemorrhages in tumor bed not contraindication to anticoagulation per  neurology. --antiepileptic plan as above --continue home Eliquis 5mg  BID - f/u UDS, UA --PT/OT recommends SNF - Delirium precautions, fall precautions  Pleomorphic xanthoastrocytoma of brain (HCC) Progression of disease seen on MRI and FDG-PET study per oncology, considering initiating oral chemotherapy --oncology to arrange follow up after hospital discharge Thrombocytopenia (HCC) Improved on CBC this morning 166 (from 132 on admission) --continue to monitor with daily CBC Hyponatremia Unknown if acute or chronic.  However, the patient does have history of normal sodium approximately 4 weeks ago.  Seems to be hypovolemic on admission. Improved to 133 this morning. - Monitor with daily BMP - encourage good PO intake as able  Chronic and Stable Issues:  HLD: Atorvastatin 20 mg, Depression: Zoloft 150 mg HTN: Amlodipine and losartan, holding in setting of soft BP Pulmonary embolism: Eliquis 5mg  BID  FEN/GI: regular (passed bedside swallow) PPx: Eliquis Dispo: pending clinical improvement  Subjective:  Pt very sleepy during exam. Able to answer a few questions but quickly falls asleep afterwards. Able to adjust position in bed to lie on back for physical exam. Not participatory with neurologic exam. Per bedside RN, pt was restless all night and did not get much sleep.   Objective: Temp:  [97.9 F (36.6 C)-99.6 F (37.6 C)] 98.5 F (36.9 C) (11/15 0500) Pulse Rate:  [93-111] 93 (11/15 0500) Resp:  [12-21] 18 (11/15 0500) BP: (115-142)/(70-99) 117/79 (11/15 0500) SpO2:  [94 %-100 %] 94 % (11/15 0500) Weight:  [65.8 kg] 65.8 kg (11/14 0800) Physical Exam: General: laying in bed, somnolent, responds to some questions Cardiovascular: RRR, normal S1/S2 Respiratory: CTAB, normal WOB on RA Abdomen: normoactive bowel sounds, soft, nondistended, no apparent signs of tenderness Extremities: SCDs in place Neuro: opens eyes intermittently to voice, states name and year correctly, not  oriented to location, PEERLA, EOM grossly intact but  unable to complete full exam due to somnolence, moving RUE and BLE spontaneously, unable to assess strength due to somnolence  Laboratory: Most recent CBC Lab Results  Component Value Date   WBC 7.8 01/01/2023   HGB 13.9 01/01/2023   HCT 42.0 01/01/2023   MCV 90.7 01/01/2023   PLT 166 01/01/2023   Most recent BMP    Latest Ref Rng & Units 01/01/2023    4:35 AM  BMP  Glucose 70 - 99 mg/dL 811   BUN 8 - 23 mg/dL 15   Creatinine 9.14 - 1.00 mg/dL 7.82   Sodium 956 - 213 mmol/L 133   Potassium 3.5 - 5.1 mmol/L 3.9   Chloride 98 - 111 mmol/L 101   CO2 22 - 32 mmol/L 22   Calcium 8.9 - 10.3 mg/dL 8.9      Lorayne Bender, MD 01/01/2023, 7:28 AM  PGY-1, [facility] FPTS Intern pager: [phone], text pages welcome Secure chat group [facility] Teaching Service

## 2023-01-01 NOTE — Assessment & Plan Note (Addendum)
Improved on CBC this morning 166 (from 132 on admission) --continue to monitor with daily CBC

## 2023-01-01 NOTE — Telephone Encounter (Signed)
Oral Oncology Patient Advocate Encounter   Received notification that prior authorization for [employer]ist/Tafinlar is required.   PA submitted on 01/01/23 Key [id number] (Mekinist) Key [id number] Fabio Neighbors) Status is pending     Jinger Neighbors, CPhT-Adv Oncology Pharmacy Patient Advocate [facility] Direct Number: [phone]  Fax: [phone]

## 2023-01-01 NOTE — Assessment & Plan Note (Addendum)
Code stroke cancelled as brain imaging negative for acute stroke or hemorrhage. Initial focal deficits likely due to Todd's paralysis per neurology vs mass effect of brain neoplasm (given persisted longer than expect with Todd's paralysis). Chronic petechial hemorrhages in tumor bed not contraindication to anticoagulation per neurology. --antiepileptic plan as above --continue home Eliquis 5mg  BID - f/u UDS, UA --PT/OT recommends SNF - Delirium precautions, fall precautions

## 2023-01-01 NOTE — Telephone Encounter (Signed)
Oral Oncology Patient Advocate Encounter  Prior Authorization for Mekinist/Tafinlar has been approved.    PA# [id number] (Mekinist) PA# [id number] Fabio Neighbors)  Effective dates: 01/01/23 through 01/01/24  Patients co-pay is $0.    Jinger Neighbors, CPhT-Adv Oncology Pharmacy Patient Advocate [facility] Direct Number: [phone]  Fax: [phone]

## 2023-01-01 NOTE — Progress Notes (Signed)
NEUROLOGY CONSULT FOLLOW UP NOTE   Date of service: January 01, 2023 Patient Name: Lindsey Daniels MRN:  [id number] DOB:  15-Nov-1955  Brief HPI  Lindsey Daniels is a 67 y.o. female  has a past medical history of Allergy, Anal fissure, Anxiety, Cataract, Depression, Elevated LFTs, Hyperglycemia, Hyperlipidemia, Hypertension, Osteoarthritis, Pars defect of lumbar spine, Pleomorphic xanthoastrocytoma of temporal lobe (HCC), Seizures (HCC), and Spondylolisthesis. PE on eliquis, Right temporal pleomorphic xanthoastrocytoma s/p crani  in 2015 and 2022 s/p chemo/rad who presents with  acute left sided weakness and left facial droop from her nursing facility. Per EMS she had a fall on the day PTA with a seizure, on the AM of presentation, staff noticed her to have left sided weakness and left facial droop.    Interval Hx/subjective   Patient is laying in the bed in NAD. No family at the bedside. Patient is very drowsy. She awakens to voice, however does not stay awake and needs constant stimulation to stay awake. No new neurological events overnight   Vitals   Vitals:   12/31/22 2046 01/01/23 0050 01/01/23 0500 01/01/23 0730  BP: 124/74 123/70 117/79 133/78  Pulse: (!) 104 (!) 104 93 96  Resp: 20 18 18 16   Temp: 99.6 F (37.6 C) 99 F (37.2 C) 98.5 F (36.9 C) 98.1 F (36.7 C)  TempSrc: Axillary Axillary Axillary Oral  SpO2: 96% 94% 94% 94%  Weight:      Height:         Body mass index is 25.7 kg/m.  Physical Exam   Constitutional: Appears well-developed and well-nourished.   Psych: Affect appropriate to situation.   Eyes: No scleral injection.   HENT: No OP obstrucion.   Head: Normocephalic.   Cardiovascular: Normal rate and regular rhythm.   Respiratory: Effort normal, non-labored breathing.   GI: Soft.  No distension. There is no tenderness.   Skin: WDI.    Neurologic Examination   Mental Status -  Drowsy, confused. Oriented to self, age and month. Not able to state  correct year or place No aphasia or dysarthria  Cranial Nerves II - XII - II - Visual fields intact OU . III, IV, VI - Extraocular movements intact . V - Facial sensation intact bilaterally . VII - left facial droop  VIII - Hearing & vestibular intact bilaterally . X - Palate elevates symmetrically . XI - Chin turning & shoulder shrug intact bilaterally . XII - Tongue protrusion intact .  Motor Strength - right arm and right leg 5/5 with slight drift in both.  Left arm able to lift forearm off bed, left leg not able to lift off the bed but can move horizontally  Motor Tone - Muscle tone was assessed at the neck and appendages and was normal . Sensory - decreased sensation on left  Coordination - The patient exhibits no ataxia disproportionate to weakness Tremor was absent. Gait and Station - deferred.  Labs and Diagnostic Imaging   CBC:  Recent Labs  Lab 12/31/22 0849 12/31/22 0850 01/01/23 0435  WBC 6.6  --  7.8  NEUTROABS 4.5  --   --   HGB 13.9 14.3 13.9  HCT 42.7 42.0 42.0  MCV 90.5  --  90.7  PLT 132*  --  166    Basic Metabolic Panel:  Lab Results  Component Value Date   NA 133 (L) 01/01/2023   K 3.9 01/01/2023   CO2 22 01/01/2023   GLUCOSE 105 (H) 01/01/2023  BUN 15 01/01/2023   CREATININE 0.60 01/01/2023   CALCIUM 8.9 01/01/2023   GFRNONAA >60 01/01/2023   GFRAA  01/18/2022    UNSATISFACTORY SPECIMEN. CLIENT REQUESTS SPECIMEN TO BE ANALYZED.   Lipid Panel:  Lab Results  Component Value Date   LDLCALC 177 (H) 06/30/2017   HgbA1c:  Lab Results  Component Value Date   HGBA1C 5.3 01/21/2022   Urine Drug Screen:     Component Value Date/Time   LABOPIA NONE DETECTED 01/18/2022 0325   COCAINSCRNUR NONE DETECTED 01/18/2022 0325   LABBENZ POSITIVE (A) 01/18/2022 0325   AMPHETMU NONE DETECTED 01/18/2022 0325   THCU NONE DETECTED 01/18/2022 0325   LABBARB NONE DETECTED 01/18/2022 0325    Alcohol Level     Component Value Date/Time   ETH <10  12/31/2022 0849   INR  Lab Results  Component Value Date   INR 1.1 12/31/2022   APTT  Lab Results  Component Value Date   APTT 25 12/31/2022   AED levels:  Lab Results  Component Value Date   LEVETIRACETA 29.4 01/17/2022    CT Head without contrast(Personally reviewed): No acute process, Aspects 10. Redemonstrated known malignancy on the right   MR Angio head without contrast and Carotid Duplex BL(Personally reviewed): Negative   MRI Brain(Personally reviewed): Unchanged extensive necrotic type enhancement of the patient's right temporoparietal mass when compared to 12/02/2022. Related T2 hyperintensity and swelling is diminished with essentially resolved mass effect.    Impression   Lindsey Daniels is a 67 y.o. female has a past medical history of Allergy, Anal fissure, Anxiety, Cataract, Depression, Elevated LFTs, Hyperglycemia, Hyperlipidemia, Hypertension, Osteoarthritis, Pars defect of lumbar spine, Seizures (HCC), and Spondylolisthesis., PE (on Eliquis) and right temporal pleomorphic xanthoastrocytoma s/p crani  in 2015 and 2022 s/p chemo/rad who presents with acute left sided weakness from her nursing facility.Per EMS she had a fall on the day PTA with a seizure, on the AM of presentation, staff noticed her to have left sided weakness and left facial droop. She is followed by her Neurooncologist Dr. Barbaraann Cao outpatient.   Recommendations  - Seizure precautions - Continue home Keppra dose 1500 mg BID  - Continue Lamictal at 150 mg BID (increased from 100 mg BID) - Continue Decadron at 4 mg po every day - EEG will be obtained, as her left sided weakness has not improved ______________________________________________________________________   Thank you for the opportunity to take part in the care of this patient. If you have any further questions, please contact the neurology consultation team on call. Updated oncall schedule is listed on AMION.  Signed, Mathews Argyle  Electronically signed: Dr. Caryl Pina

## 2023-01-01 NOTE — Telephone Encounter (Signed)
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  BMP and CBC from 01/01/23 as well as CMP and CBC and Diff from 12/31/22 assessed, noted patient with T.bili of 1.9 mg/dL, but AST and ALT WNL - recommend continue monitoring LFTs and for side effects from Mek/Taf as drug metabolism is the primarly hepatic for both agents. Patient will need baseline ECHO prior to initiation of Mek/Taf - MD notified. Prescription doses and frequencies assessed for appropriateness.  Current medication list in Epic reviewed, DDIs with Tafinlar identified: Category C drug-drug interaction between Tafinlar and Amlodipine - Tafinlar, a moderate CYP3A4 inducer may decrease serum concentrations of amlodipine. Monitor patient for increased BP when on both agents, and adjust antihypertensives accordingly. No baseline changes required at this time.  Category C drug-drug interaction between Tafinlar and Atorvastatin - Tafinlar, a moderate CYP3A4 inducer may decrease serum concentrations of atorvastatin. Monitor patient for decreased efficacy on atorvastatin and adjust dose of atorvastatin as needed. No baseline changes required at this time.  Category C drug-drug interaction between Tafinlar and Dexamethasone - Tafinlar may decrease serum concentrations of dexamethasone via CYP3A4 induction. Monitor patient for decreased response to dexamethasone and dose adjust as needed. No up front dose adjustments warranted at this time.   Category C drug-drug interaction between Tafinlar and Midazolam - Tafinlar may decrease serum concentrations of midazolam via CYP3A4 induction. Noted patient using midazolam PRN intranasally, so risk of DDI is less than if patient were to take midazolam via oral route. Monitor patient for decreased response  to midazolam and dose adjust as needed. No up front dose adjustments warranted at this time.   Category C drug-drug interaction between Tafinlar and Sertraline - Tafinlar may decrease serum concentrations/efficacy of sertraline via CYP3A4 induction. Monitor patient for decreased response to sertraline/mood changes. No dose adjustments required at this time.   Evaluated chart and no patient barriers to medication adherence noted.   Prescription has been e-scribed to the [facility] for benefits analysis and approval.  Oral Oncology Clinic will continue to follow for insurance authorization, copayment issues, initial counseling and start date.  Lenord Carbo, PharmD, BCPS, [facility] Hematology/Oncology Clinical Pharmacist [facility] Oral Chemotherapy Navigation Clinics [phone] 01/01/2023 10:48 AM

## 2023-01-01 NOTE — Assessment & Plan Note (Addendum)
Unknown if acute or chronic.  However, the patient does have history of normal sodium approximately 4 weeks ago.  Seems to be hypovolemic on admission. Improved to 133 this morning. - Monitor with daily BMP - encourage good PO intake as able

## 2023-01-01 NOTE — Progress Notes (Signed)
SLP Cancellation Note  Patient Details Name: Lindsey Daniels MRN: [id number] DOB: 06/20/1955   Cancelled treatment:       Reason Eval/Treat Not Completed: SLP screened. Per RN, pt passed the [place] swallow screen and is having no difficulty with regular diet. No needs identified, will sign off.    Gwynneth Aliment, M.A., CF-SLP Speech Language Pathology, Acute Rehabilitation Services  Secure Chat preferred [phone]  01/01/2023, 9:27 AM

## 2023-01-02 ENCOUNTER — Encounter (HOSPITAL_COMMUNITY): Payer: Self-pay | Admitting: Student

## 2023-01-02 ENCOUNTER — Inpatient Hospital Stay (HOSPITAL_COMMUNITY): Payer: BC Managed Care – PPO

## 2023-01-02 DIAGNOSIS — R531 Weakness: Secondary | ICD-10-CM | POA: Diagnosis not present

## 2023-01-02 DIAGNOSIS — R569 Unspecified convulsions: Secondary | ICD-10-CM | POA: Diagnosis not present

## 2023-01-02 DIAGNOSIS — Z0189 Encounter for other specified special examinations: Secondary | ICD-10-CM | POA: Diagnosis not present

## 2023-01-02 LAB — CBC
HCT: 39.4 % (ref 36.0–46.0)
Hemoglobin: 13.4 g/dL (ref 12.0–15.0)
MCH: 29.8 pg (ref 26.0–34.0)
MCHC: 34 g/dL (ref 30.0–36.0)
MCV: 87.6 fL (ref 80.0–100.0)
Platelets: 167 10*3/uL (ref 150–400)
RBC: 4.5 MIL/uL (ref 3.87–5.11)
RDW: 14.8 % (ref 11.5–15.5)
WBC: 7.6 10*3/uL (ref 4.0–10.5)
nRBC: 0 % (ref 0.0–0.2)

## 2023-01-02 LAB — BASIC METABOLIC PANEL
Anion gap: 8 (ref 5–15)
BUN: 20 mg/dL (ref 8–23)
CO2: 24 mmol/L (ref 22–32)
Calcium: 9.3 mg/dL (ref 8.9–10.3)
Chloride: 102 mmol/L (ref 98–111)
Creatinine, Ser: 0.69 mg/dL (ref 0.44–1.00)
GFR, Estimated: >60 mL/min (ref >60)
Glucose, Bld: 110 mg/dL — ABNORMAL HIGH (ref 70–99)
Potassium: 3.7 mmol/L (ref 3.5–5.1)
Sodium: 134 mmol/L — ABNORMAL LOW (ref 135–145)

## 2023-01-02 LAB — ECHOCARDIOGRAM COMPLETE
Area-P 1/2: 3.19 cm2
Height: 63 in
S' Lateral: 3 cm
Weight: 2321 [oz_av]

## 2023-01-02 MED ORDER — PERFLUTREN LIPID MICROSPHERE
1.0000 mL | INTRAVENOUS | Status: AC | PRN
  Administered 2023-01-02: 5 mL via INTRAVENOUS

## 2023-01-02 MED ORDER — LAMOTRIGINE 25 MG PO TABS
150.0000 mg | ORAL_TABLET | Freq: Two times a day (BID) | ORAL | Status: DC
  Administered 2023-01-02 – 2023-01-06 (×8): 150 mg via ORAL
  Filled 2023-01-02 (×8): qty 2

## 2023-01-02 NOTE — Assessment & Plan Note (Addendum)
Resolved

## 2023-01-02 NOTE — Progress Notes (Signed)
Attempted Echocardiogram, patient care is in progress followed by EEG. Will attempt later.

## 2023-01-02 NOTE — Procedures (Signed)
Patient Name: Lindsey Daniels  MRN: [id number]  Epilepsy Attending: [name]  Referring Physician/Provider: Caryl Pina, MD  Date: 01/02/2023 Duration: 29.33 mins  Patient history: 67 yo F with witnessed seizure at facility and L sided weakness and facial droop afterwards. EEG to evaluate for seizure  Level of alertness: Awake  AEDs during EEG study: LEV, LTG  Technical aspects: This EEG study was done with scalp electrodes positioned according to the 10-20 International system of electrode placement. Electrical activity was reviewed with band pass filter of 1-70Hz , sensitivity of 7 uV/mm, display speed of 74mm/sec with a 60Hz  notched filter applied as appropriate. EEG data were recorded continuously and digitally stored.  Video monitoring was available and reviewed as appropriate.  Description: The posterior dominant rhythm consists of 9 Hz activity of moderate voltage (25-35 uV) seen predominantly in posterior head regions, symmetric and reactive to eye opening and eye closing. EEG showed continuous polymorphic 3 to 6 Hz theta-delta slowing in right hemisphere. Frequent spikes were noted in right temporo-parietal region. Hyperventilation and photic stimulation were not performed.     ABNORMALITY  - Spike, right temporo-parietal region - Continuous slow, right hemisphere  IMPRESSION: This study showed evidence of epileptogenicity arising from  right temporo-parietal region. Additionally there is cortical dysfunction arising from right hemisphere likely secondary to underlying encephalomalacia. No seizures were seen throughout the recording.  Lindsey Daniels

## 2023-01-02 NOTE — Progress Notes (Signed)
NEUROLOGY CONSULT FOLLOW UP NOTE   Date of service: January 02, 2023 Patient Name: Lindsey Daniels MRN:  [id number] DOB:  10/16/1955  Brief HPI  Lindsey Daniels is a 67 y.o. female  has a past medical history of Allergy, Anal fissure, Anxiety, Cataract, Depression, Elevated LFTs, Hyperglycemia, Hyperlipidemia, Hypertension, Osteoarthritis, Pars defect of lumbar spine, Pleomorphic xanthoastrocytoma of temporal lobe (HCC), Seizures (HCC), Spondylolisthesis, and Thrombocytopenia (HCC) (12/31/2022). PE on eliquis, Right temporal pleomorphic xanthoastrocytoma s/p crani  in 2015 and 2022 s/p chemo/rad who presented on Thursday morning from her SNF with acute left sided weakness and left facial droop. Per EMS she had a fall on the day PTA with a seizure; on the AM of presentation, staff noticed her to have left sided weakness and left facial droop.    Interval Hx/subjective   Patient is laying in the bed in NAD. No family at the bedside.  Patient is having EEG leads placed. Her mentation is much improved today. She is awake and alert, oriented x 4, able to move her left arm and left leg. She states she is back to baseline. No new neurological events overnight  Vitals   Vitals:   01/01/23 1935 01/01/23 2313 01/02/23 0333 01/02/23 0721  BP: 117/65 136/86 (!) 142/73 120/79  Pulse: 95 87 85 87  Resp: 18 16 15 17   Temp: 99 F (37.2 C) 97.7 F (36.5 C) 98.4 F (36.9 C) 98.4 F (36.9 C)  TempSrc: Axillary Oral Oral Oral  SpO2: 96% 94% 96% 94%  Weight:      Height:         Body mass index is 25.7 kg/m.  Physical Exam   Constitutional: Appears well-developed and well-nourished.   Psych: Affect appropriate to situation.   Eyes: No scleral injection.   HENT: No OP obstrucion.   Head: Normocephalic.   Cardiovascular: Normal rate and regular rhythm.   Respiratory: Effort normal, non-labored breathing.   GI: Soft.  No distension. There is no tenderness.   Skin: WDI.    Neurologic  Examination   Mental Status -  Awake and alert.  Oriented to self, age, month, year, place and situation.  No aphasia or dysarthria  Cranial Nerves II - XII - II - Visual fields intact OU . III, IV, VI - Extraocular movements intact . V - Facial sensation intact bilaterally . VII - left facial droop  VIII - Hearing & vestibular intact bilaterally . X - Palate elevates symmetrically . XI - Chin turning & shoulder shrug intact bilaterally . XII - Tongue protrusion intact .  Motor Strength - right arm and right leg 5/5 with slight drift in both.  Left arm able to lift off the bed 4/5 with no drift; able to lift and hold left leg off the bed  Motor Tone - Muscle tone was assessed at the neck and appendages and was normal . Sensory - decreased sensation on left  Coordination - The patient exhibits no ataxia disproportionate to weakness Tremor was absent. Gait and Station - Deferred.  Labs and Diagnostic Imaging   CBC:  Recent Labs  Lab 12/31/22 0849 12/31/22 0850 01/01/23 0435 01/02/23 0454  WBC 6.6  --  7.8 7.6  NEUTROABS 4.5  --   --   --   HGB 13.9   < > 13.9 13.4  HCT 42.7   < > 42.0 39.4  MCV 90.5  --  90.7 87.6  PLT 132*  --  166 167   < > =  values in this interval not displayed.    Basic Metabolic Panel:  Lab Results  Component Value Date   NA 134 (L) 01/02/2023   K 3.7 01/02/2023   CO2 24 01/02/2023   GLUCOSE 110 (H) 01/02/2023   BUN 20 01/02/2023   CREATININE 0.69 01/02/2023   CALCIUM 9.3 01/02/2023   GFRNONAA >60 01/02/2023   GFRAA  01/18/2022    UNSATISFACTORY SPECIMEN. CLIENT REQUESTS SPECIMEN TO BE ANALYZED.   Lipid Panel:  Lab Results  Component Value Date   LDLCALC 177 (H) 06/30/2017   HgbA1c:  Lab Results  Component Value Date   HGBA1C 5.3 01/21/2022   Urine Drug Screen:     Component Value Date/Time   LABOPIA NONE DETECTED 01/01/2023 1530   COCAINSCRNUR NONE DETECTED 01/01/2023 1530   LABBENZ NONE DETECTED 01/01/2023 1530   AMPHETMU  NONE DETECTED 01/01/2023 1530   THCU NONE DETECTED 01/01/2023 1530   LABBARB NONE DETECTED 01/01/2023 1530    Alcohol Level     Component Value Date/Time   ETH <10 12/31/2022 0849   INR  Lab Results  Component Value Date   INR 1.1 12/31/2022   APTT  Lab Results  Component Value Date   APTT 25 12/31/2022    CT Head without contrast(Personally reviewed): No acute process, Aspects 10. Redemonstrated known malignancy on the right   MR Angio head without contrast and Carotid Duplex BL(Personally reviewed): Negative   MRI Brain(Personally reviewed): Unchanged extensive necrotic type enhancement of the patient's right temporoparietal mass when compared to 12/02/2022. Related T2 hyperintensity and swelling is diminished with essentially resolved mass effect.    Impression  Lindsey Daniels is a 67 y.o. female has a past medical history of Allergy, Anal fissure, Anxiety, Cataract, Depression, Elevated LFTs, Hyperglycemia, Hyperlipidemia, Hypertension, Osteoarthritis, Pars defect of lumbar spine, Seizures (HCC), and Spondylolisthesis., PE (on Eliquis) and right temporal pleomorphic xanthoastrocytoma s/p crani  in 2015 and 2022 s/p chemo/rad who presents with acute left sided weakness from her nursing facility.Per EMS she had a fall on the day PTA with a seizure, on the AM of presentation, staff noticed her to have left sided weakness and left facial droop. She is followed by her Neurooncologist Dr. Barbaraann Cao outpatient.  - Exam reveals improved left sided weakness. Holds leg antigravity without drift; holds LUE antigravity without drift. Her mentation is much improved today; she is awake and alert, oriented x 4. - The patient states that she is back to her baseline. Wants discharge as soon as possible.  - EEG: Spike, right temporo-parietal region; continuous slow, right hemisphere. This study showed evidence of epileptogenicity arising from  right temporo-parietal region. Additionally there is  cortical dysfunction arising from right hemisphere likely secondary to underlying encephalomalacia. No seizures were seen throughout the recording. 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   Recommendations  - Seizure precautions - Continue home Keppra dose 1500 mg BID  - Continue Lamictal at 150 mg BID (increased from 100 mg BID) - Continue Decadron at 4 mg po every day - No further recommendations from a neurology standpoint.  - Outpatient follow up with Dr. Barbaraann Cao - Neurohospitalist service will sign off. Please call if there are additional questions.  ______________________________________________________________________   Thank you for the opportunity to take part in the care of this patient. If you have any further questions, please contact the neurology consultation team on call. Updated oncall schedule is listed on AMION.  Signed,  Mathews Argyle  Electronically signed: Dr. Caryl Pina

## 2023-01-02 NOTE — Progress Notes (Signed)
Echocardiogram 2D Echocardiogram has been performed.  Warren Lacy Morrison Mcbryar RDCS 01/02/2023, 2:02 PM

## 2023-01-02 NOTE — Assessment & Plan Note (Addendum)
Reported worsened left sided weakness thought to be in the setting of breakthrough seizures or mass effect from brain tumor. UDS/UA negative.  --neurology following, appreciate recommendations --EEG due to persistent left sided weakness --antiepileptic plan as above --continue home Eliquis 5mg  BID --PT/OT recommends SNF, continue following -Delirium precautions, fall precautions

## 2023-01-02 NOTE — Progress Notes (Signed)
EEG complete - results pending 

## 2023-01-02 NOTE — Plan of Care (Signed)
Called patient's brother and provided update regarding patient's medical care.

## 2023-01-02 NOTE — Assessment & Plan Note (Addendum)
Unknown if acute or chronic.  However, the patient does have history of normal sodium approximately 4 weeks ago.  Seems to be hypovolemic on admission. Improved to 134 this morning. - Monitor with daily BMP - encourage good PO intake as able

## 2023-01-02 NOTE — Plan of Care (Signed)
  Problem: Education: Goal: Knowledge of General Education information will improve Description: Including pain rating scale, medication(s)/side effects and non-pharmacologic comfort measures Outcome: Progressing   Problem: Clinical Measurements: Goal: Ability to maintain clinical measurements within normal limits will improve Outcome: Progressing Goal: Will remain free from infection Outcome: Progressing Goal: Respiratory complications will improve Outcome: Progressing Goal: Cardiovascular complication will be avoided Outcome: Progressing   Problem: Elimination: Goal: Will not experience complications related to urinary retention Outcome: Progressing   Problem: Pain Management: Goal: General experience of comfort will improve Outcome: Progressing   Problem: Activity: Goal: Risk for activity intolerance will decrease Outcome: Not Progressing

## 2023-01-02 NOTE — Assessment & Plan Note (Addendum)
Witnessed seizure at facility with noted L sided weakness and facial droop afterwards. Pt on Keppra and Lamictal (recently added) outpatient with good adherence. --Neurology recommendations as follows: --continue home Keppra 1500mg  BID --home lamictal increased to 100mg  BID (was taking 100mg  QD) --continue Decadron 4mg  daily --EEG due to persistent left sided weakness that is not improving --seizure precautions

## 2023-01-02 NOTE — Progress Notes (Signed)
Daily Progress Note Intern Pager: [phone]  Patient name: Lindsey Daniels Medical record number: [id number] Date of birth: 1956/01/01 Age: 67 y.o. Gender: female  Primary Care Provider: Porfirio Oar, PA Consultants: oncology, neurology Code Status: DNR-I No CPR or chest compressions.   In Pre-Arrest Conditions (Patient Has Pulse and Is Breathing) May intubate, use advanced airway interventions and cardioversion/ACLS medications if appropriate or indicated. May transfer to ICU.   Pt Overview and Major Events to Date:  11/14 - admitted  Assessment and Plan:  67 year old female with PMHx HLD, GERD, depression, HTN presenting with worsening L sided weakness and change in speech likely due to breakthrough seizures in the setting of known pleomorphic xanthoastrocytoma. Assessment & Plan Seizure [facility]) Witnessed seizure at facility with noted L sided weakness and facial droop afterwards. Pt on Keppra and Lamictal (recently added) outpatient with good adherence. --Neurology recommendations as follows: --continue home Keppra 1500mg  BID --home lamictal increased to 100mg  BID (was taking 100mg  QD) --continue Decadron 4mg  daily --EEG due to persistent left sided weakness that is not improving --seizure precautions Left-sided weakness Reported worsened left sided weakness thought to be in the setting of breakthrough seizures or mass effect from brain tumor. UDS/UA negative.  --neurology following, appreciate recommendations --EEG due to persistent left sided weakness --antiepileptic plan as above --continue home Eliquis 5mg  BID --PT/OT recommends SNF, continue following -Delirium precautions, fall precautions Pleomorphic xanthoastrocytoma of brain (HCC) Progression of disease seen on MRI and FDG-PET study per oncology, considering initiating oral chemotherapy outpatient (Dabrafenib + Trametinib) --oncology to arrange follow up after hospital discharge --echo prior to starting  chemo Hyponatremia Unknown if acute or chronic.  However, the patient does have history of normal sodium approximately 4 weeks ago.  Seems to be hypovolemic on admission. Improved to 134 this morning. - Monitor with daily BMP - encourage good PO intake as able Thrombocytopenia (HCC) (Resolved: 01/02/2023) Resolved   Chronic and Stable Problems:  HLD - continue lipitor 20mg  every day GERD - continue protonix 40mg  every day Depression - continue zoloft 150mg  HTN - holding home amlodipine and losartan  FEN/GI: regular PPx: eliquis 5mg  BID Dispo:pending clinical improvement   Subjective:  No acute events overnight.  Patient very sleepy but states that she has no concerns right now.  Objective: Temp:  [97.7 F (36.5 C)-99 F (37.2 C)] 98.4 F (36.9 C) (11/16 0721) Pulse Rate:  [85-105] 87 (11/16 0721) Resp:  [15-18] 17 (11/16 0721) BP: (115-142)/(55-86) 120/79 (11/16 0721) SpO2:  [94 %-96 %] 94 % (11/16 0721) Physical Exam: General: Laying in bed, very sleepy but responding to questions Cardiovascular: RRR, no murmurs Respiratory: no WOB on RA Neuro: Will open eyes to answer questions but then go back to sleep, unable to complete full exam due to sleepiness, moving right upper extremity and bilateral lower extremity spontaneously  Laboratory: Most recent CBC Lab Results  Component Value Date   WBC 7.6 01/02/2023   HGB 13.4 01/02/2023   HCT 39.4 01/02/2023   MCV 87.6 01/02/2023   PLT 167 01/02/2023   Most recent BMP    Latest Ref Rng & Units 01/02/2023    4:54 AM  BMP  Glucose 70 - 99 mg/dL 027   BUN 8 - 23 mg/dL 20   Creatinine 2.53 - 1.00 mg/dL 6.64   Sodium 403 - 474 mmol/L 134   Potassium 3.5 - 5.1 mmol/L 3.7   Chloride 98 - 111 mmol/L 102   CO2 22 - 32 mmol/L  24   Calcium 8.9 - 10.3 mg/dL 9.3    UDS negative  Imaging/Diagnostic Tests: MR Brain W/WO, MRA Head WO Contrast 12/31/22 IMPRESSION: 1. Unchanged extensive necrotic type enhancement the patient's  right temporoparietal mass when compared to 12/02/2022. Related T2 hyperintensity and swelling is diminished with essentially resolved mass effect. 2. Negative, motion degraded MRA.  CT C-Spine WO Contrast 12/31/22 IMPRESSION: No evidence of acute fracture or traumatic malalignment.  CT Head WO Contrast 12/31/22 IMPRESSION: 1. 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 Please see forthcoming MRI for further evaluation. 2. No evidence acute superimposed large vascular territory infarct or hemorrhage.  NM PET Metabolic Brain 12/27/12 IMPRESSION: Focus intense radiotracer activity in the lateral RIGHT temporal lobe beneath craniotomy flap which corresponds to enhancing tissue on comparison MRI. Findings concerning for tumor recurrence.  Ritter Helsley, DO 01/02/2023, 7:45 AM  PGY-1, [facility] FPTS Intern pager: [phone], text pages welcome Secure chat group [facility] Teaching Service

## 2023-01-02 NOTE — Assessment & Plan Note (Addendum)
Progression of disease seen on MRI and FDG-PET study per oncology, considering initiating oral chemotherapy outpatient (Dabrafenib + Trametinib) --oncology to arrange follow up after hospital discharge --echo prior to starting chemo

## 2023-01-02 NOTE — Plan of Care (Signed)
  Problem: Education: Goal: Knowledge of General Education information will improve Description: Including pain rating scale, medication(s)/side effects and non-pharmacologic comfort measures Outcome: Progressing   Problem: Clinical Measurements: Goal: Respiratory complications will improve Outcome: Progressing   Problem: Activity: Goal: Risk for activity intolerance will decrease Outcome: Progressing   Problem: Nutrition: Goal: Adequate nutrition will be maintained Outcome: Progressing   Problem: Coping: Goal: Level of anxiety will decrease Outcome: Progressing   Problem: Skin Integrity: Goal: Risk for impaired skin integrity will decrease Outcome: Progressing

## 2023-01-03 DIAGNOSIS — R531 Weakness: Secondary | ICD-10-CM | POA: Diagnosis not present

## 2023-01-03 NOTE — Plan of Care (Signed)
  Problem: Education: Goal: Knowledge of General Education information will improve Description: Including pain rating scale, medication(s)/side effects and non-pharmacologic comfort measures Outcome: Progressing   Problem: Clinical Measurements: Goal: Will remain free from infection Outcome: Progressing Goal: Respiratory complications will improve Outcome: Progressing Goal: Cardiovascular complication will be avoided Outcome: Progressing   Problem: Activity: Goal: Risk for activity intolerance will decrease Outcome: Progressing   Problem: Nutrition: Goal: Adequate nutrition will be maintained Outcome: Progressing   Problem: Elimination: Goal: Will not experience complications related to bowel motility Outcome: Progressing Goal: Will not experience complications related to urinary retention Outcome: Progressing   Problem: Pain Management: Goal: General experience of comfort will improve Outcome: Progressing   Problem: Safety: Goal: Ability to remain free from injury will improve Outcome: Progressing   Problem: Skin Integrity: Goal: Risk for impaired skin integrity will decrease Outcome: Progressing

## 2023-01-03 NOTE — Discharge Instructions (Signed)
Thank you for letting us care for you during your stay.  You were admitted to the [facility] Medicine Teaching Service.   You were admitted for seizure likely due to your brain tumors. Your seizure medication was increased.  We recommend follow up specifically with your Neurooncologist for treatment.  If your symptoms worsen or return, please return to the hospital.  Please let us know if you have questions about your stay at [facility].

## 2023-01-03 NOTE — Assessment & Plan Note (Addendum)
Improved, likely in the setting of Todd's paralysis  -Awaiting return to [facility] (SNF)

## 2023-01-03 NOTE — Plan of Care (Signed)
Patient ID: Lindsey Daniels, female   DOB: 10/01/1955, 67 y.o.   MRN: [id number]  Problem: Education: Goal: Knowledge of General Education information will improve Description: Including pain rating scale, medication(s)/side effects and non-pharmacologic comfort measures Outcome: Progressing Note: Patient is aware of chronic medical conditions but due to brain tumor periodically becomes confused but is easily reoriented.   Problem: Clinical Measurements: Goal: Ability to maintain clinical measurements within normal limits will improve Outcome: Progressing Note: BP 113/80 (BP Location: Left Arm)   Pulse 88   Temp 98.5 F (36.9 C) (Oral)   Resp 17   Ht 5\' 3"  (1.6 m)   Wt 65.8 kg   SpO2 96%   BMI 25.70 kg/m   Goal: Will remain free from infection Outcome: Progressing Goal: Diagnostic test results will improve Outcome: Progressing Goal: Respiratory complications will improve Outcome: Progressing Goal: Cardiovascular complication will be avoided Outcome: Progressing   Problem: Activity: Goal: Risk for activity intolerance will decrease Outcome: Progressing Note: Patient is doing well with mobility   Problem: Nutrition: Goal: Adequate nutrition will be maintained Outcome: Progressing Note: Patient does well eating her meals unassisted after setup   Problem: Coping: Goal: Level of anxiety will decrease Outcome: Progressing   Problem: Elimination: Goal: Will not experience complications related to bowel motility Outcome: Progressing Goal: Will not experience complications related to urinary retention Outcome: Progressing   Problem: Pain Management: Goal: General experience of comfort will improve Outcome: Progressing Note: Patient has had no c/o pain   Problem: Safety: Goal: Ability to remain free from injury will improve Outcome: Progressing   Problem: Skin Integrity: Goal: Risk for impaired skin integrity will decrease Outcome: Progressing

## 2023-01-03 NOTE — Plan of Care (Signed)
  Problem: Clinical Measurements: Goal: Will remain free from infection Outcome: Progressing Goal: Respiratory complications will improve Outcome: Progressing Goal: Cardiovascular complication will be avoided Outcome: Progressing   Problem: Activity: Goal: Risk for activity intolerance will decrease Outcome: Progressing   Problem: Nutrition: Goal: Adequate nutrition will be maintained Outcome: Progressing   Problem: Coping: Goal: Level of anxiety will decrease Outcome: Progressing   Problem: Elimination: Goal: Will not experience complications related to bowel motility Outcome: Progressing Goal: Will not experience complications related to urinary retention Outcome: Progressing   Problem: Pain Management: Goal: General experience of comfort will improve Outcome: Progressing   Problem: Safety: Goal: Ability to remain free from injury will improve Outcome: Progressing   Problem: Skin Integrity: Goal: Risk for impaired skin integrity will decrease Outcome: Progressing

## 2023-01-03 NOTE — Assessment & Plan Note (Addendum)
Progression of disease, considering initiation of chemotherapy outpatient. Echo obtained with EF 50-55% and LBBB -Neurooncologist outpatient f/u (Dr. Barbaraann Cao)

## 2023-01-03 NOTE — Discharge Summary (Shared)
Family Medicine Teaching [facility] Discharge Summary  Patient name: Lindsey Daniels Medical record number: [id number] Date of birth: [birth year] Age: 67 y.o. 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 Her hospital course is detailed below:  Left-sided weakness speech concerns Patient was at her nursing facility when she had a seizure (with history of seizure disorder and breakthrough seizures) day prior to coming into the ED.  At that time, seizure resolved and she was back at baseline.  However, the next morning, she had left-sided weakness that was worse than before and inability to speak prompting ED visit.  In the ED, they obtained a CT head that was negative and an MRI brain that showed no acute changes.  Neurology was consulted and recommended continuing home keppra 1500mg  BID and increasing lamictal to 150mg  BID. Also recommended restarting Eliquis. Oncology reviewed imaging and believes known brain neoplasm has progressed, agreed with neurology recommendations for [facility]. Per facility, pt was taking 100mg  lamictal daily prior to admission so dose was adjusted to 100mg  BID.  PET scan showed some disease progression of brain tumor, oncology thinks patient would be a good  candidate for chemotherapy outpatient.  Echo was obtained in the hospital prior to starting chemo.  Other chronic conditions were medically managed with home medications and formulary alternatives as necessary (hyperlipidemia, GERD, depression, hypertension)  PCP Follow-up Recommendations: Normotensive during hospital stay, discontinued home blood pressure medication   Disposition: SNF  Discharge Condition: Stable  Discharge Exam:  Vitals:   01/02/23 2311 01/03/23 0315  BP: 103/75 119/74  Pulse: 83 83  Resp: 16 16  Temp: 98.5 F (36.9 C) 98.8 F (37.1 C)  SpO2: 94% 95%   General: Sitting up in bed watching TV.  No acute distress. Cardiovascular: RRR without murmur Respiratory: Normal work of breathing on room air Abdomen: Soft, nondistended Neuro: Moves bilateral lower extremities without concern.  Moves fingers of right hand but unable to grip fully, 3/5 strength.  Sensation intact globally.  Significant Procedures: None  Significant Labs and Imaging:  Recent Labs  Lab 01/02/23 0454  WBC 7.6  HGB 13.4  HCT 39.4  PLT 167   Recent Labs  Lab 01/02/23 0454  NA 134*  K 3.7  CL 102  CO2 24  GLUCOSE 110*  BUN 20  CREATININE 0.69  CALCIUM 9.3    Pertinent Imaging: ECHOCARDIOGRAM COMPLETE Result Date: 01/02/2023 IMPRESSIONS  1. Abnormal (paradoxical) septal motion, consistent with left bundle branch block. Left ventricular ejection fraction, by estimation, is 50 to 55%. The left ventricle has low normal function. The left ventricle has no regional wall motion abnormalities.  There is mild asymmetric left ventricular hypertrophy of the septal segment. Left ventricular diastolic parameters are consistent with Grade I diastolic dysfunction (impaired relaxation).  2. Right ventricular systolic function is normal. The right ventricular size is normal. Tricuspid regurgitation signal is inadequate for  assessing PA pressure.  3. The mitral valve is normal in structure. Trivial  mitral valve regurgitation.  4. The aortic valve is tricuspid. Aortic valve regurgitation is not visualized.  5. The inferior vena cava is normal in size with greater than 50% respiratory variability, suggesting right atrial pressure of 3 mmHg. Comparison(s): Unable to view 2015 study.   EEG adult Result Date: 01/02/2023 IMPRESSION: This study showed evidence of epileptogenicity arising from  right temporo-parietal region. Additionally there is cortical dysfunction arising from right hemisphere likely secondary to underlying encephalomalacia. No seizures were seen throughout the recording. Lindsey Daniels   MR ANGIO HEAD WO CONTRAST Result Date: 12/31/2022 IMPRESSION: 1. Unchanged extensive necrotic type enhancement the patient's right temporoparietal mass when compared to 12/02/2022. Related T2 hyperintensity and swelling is diminished with essentially resolved mass effect. 2. Negative, motion degraded MRA.  MR BRAIN W WO CONTRAST Result Date: 12/31/2022 IMPRESSION: 1. Unchanged extensive necrotic type enhancement the patient's right temporoparietal mass when compared to 12/02/2022. Related T2 hyperintensity and swelling is diminished with essentially resolved mass effect. 2. Negative, motion degraded MRA.  CT Cervical Spine Wo Contrast Result Date: 12/31/2022 IMPRESSION: No evidence of acute fracture or traumatic malalignment.  CT HEAD CODE STROKE WO CONTRAST Result Date: 12/31/2022 IMPRESSION: 1. 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 Please see forthcoming MRI for further evaluation. 2. No evidence acute superimposed large vascular territory infarct or hemorrhage  NM PET Metabolic Brain Result Date: 12/28/2022 IMPRESSION: Focus intense radiotracer activity in the lateral RIGHT temporal lobe beneath craniotomy flap which corresponds to enhancing tissue on comparison MRI. Findings concerning for tumor recurrence.  Results/Tests Pending at Time  of Discharge: None  Discharge Medications:  Allergies as of 01/03/2023       Reactions   Rifapentine Other (See Comments)   Flu-like symptoms   Amoxicillin Rash   Clavulanic Acid Rash     Med Rec must be completed prior to using this [facility]***       Discharge Instructions: Please refer to Patient Instructions section of EMR for full details.  Patient was counseled important signs and symptoms that should prompt return to medical care, changes in medications, dietary instructions, activity restrictions, and follow up appointments.   Follow-Up Appointments: With Neurooncologist   Elberta Fortis, MD 01/03/2023, 7:34 AM PGY-2, [place] [facility]

## 2023-01-03 NOTE — Progress Notes (Signed)
Daily Progress Note Intern Pager: [phone]  Patient name: Lindsey Daniels Medical record number: [id number] Date of birth: 05-18-1955 Age: 67 y.o. Gender: female  Primary Care Provider: Porfirio Oar, PA Consultants: Oncology, Neurology Code Status: DNR  Pt Overview and Major Events to Date:  11/14: Admitted to FMTS  Assessment and Plan: 67 year old female with PMHx HLD, GERD, depression, HTN presenting with worsening L sided weakness and change in speech likely due to breakthrough seizures in the setting of known pleomorphic xanthoastrocytoma. Assessment & Plan Seizure [facility]) EEG negative for seizure but showed epileptogenicity in area of encephalomalacia. No further seizures events during hospitalization. 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 Echo obtained with EF 50-55% and LBBB -Neurooncologist outpatient f/u (Dr. Barbaraann Cao) Hyponatremia (Resolved: 01/03/2023) Resolving, Na 134 this AM   Chronic and Stable Problems:  HLD - continue lipitor 20mg  every day GERD - continue protonix 40mg  every day Depression - continue zoloft 150mg  HTN - holding home amlodipine and losartan (continues to be normotensive)   FEN/GI: Regular PPx: Eliquis 5mg  BID Dispo: Pending SNF placement  Subjective:  Patient assessed at bedside, reports she is "waiting for sandwich".  Patient mistakenly thought TV remote was food.  Denies pain.  Reports she is ready to go back to [facility] but wants to be in "her room".  Objective: Temp:  [98.2 F (36.8 C)-98.8 F (37.1 C)] 98.8 F (37.1 C) (11/17  0315) Pulse Rate:  [83-105] 83 (11/17 0315) Resp:  [15-18] 16 (11/17 0315) BP: (103-128)/(66-79) 119/74 (11/17 0315) SpO2:  [94 %-97 %] 95 % (11/17 0315) Physical Exam: General: Sitting up in bed watching TV.  No acute distress. Cardiovascular: RRR without murmur Respiratory: Normal work of breathing on room air Abdomen: Soft, nondistended Neuro: Moves bilateral lower extremities without concern.  Moves fingers of right hand but unable to grip fully, 3/5 strength.  Sensation intact globally.  Laboratory: Most recent CBC Lab Results  Component Value Date   WBC 7.6 01/02/2023   HGB 13.4 01/02/2023   HCT 39.4 01/02/2023   MCV 87.6 01/02/2023   PLT 167 01/02/2023   Most recent BMP    Latest Ref Rng & Units 01/02/2023    4:54 AM  BMP  Glucose 70 - 99 mg/dL 416   BUN 8 - 23 mg/dL 20   Creatinine 6.06 - 1.00 mg/dL 3.01   Sodium 601 - 093 mmol/L 134   Potassium 3.5 - 5.1 mmol/L 3.7   Chloride 98 - 111 mmol/L 102   CO2 22 - 32 mmol/L 24   Calcium 8.9 - 10.3 mg/dL 9.3     Other pertinent labs: None  ECHOCARDIOGRAM COMPLETE Result Date: 01/02/2023 IMPRESSIONS  1. Abnormal (paradoxical) septal motion, consistent with left bundle branch block. Left ventricular ejection fraction, by estimation, is 50 to 55%. The left ventricle has low normal function. The left ventricle has no regional wall motion abnormalities.  There is mild asymmetric left ventricular hypertrophy of the septal segment. Left ventricular diastolic parameters are consistent with Grade I diastolic dysfunction (impaired relaxation).  2. Right ventricular systolic function is normal. The right ventricular size is normal. Tricuspid regurgitation signal is inadequate for assessing PA pressure.  3. The mitral valve is normal in structure. Trivial mitral valve regurgitation.  4. The aortic valve is tricuspid. Aortic valve regurgitation is not visualized.  5. The inferior vena cava is normal in size with greater than 50%  respiratory variability, suggesting right atrial pressure of 3 mmHg. Comparison(s): Unable to view 2015 study.   EEG adult Result Date: 01/02/2023 IMPRESSION: This study showed evidence of epileptogenicity arising from  right temporo-parietal region. Additionally there is cortical dysfunction arising from right hemisphere likely secondary to underlying encephalomalacia. No seizures were seen throughout the recording. Priyanka Jeannene Patella, MD 01/03/2023, 5:55 AM  PGY-2, [place] [facility] FPTS Intern pager: [phone], text pages welcome Secure chat group [facility] Teaching Service

## 2023-01-03 NOTE — Assessment & Plan Note (Addendum)
EEG negative for seizure but showed epileptogenicity in area of encephalomalacia. No further seizures events during hospitalization. Seizures occurring in the setting of known brain tumors -Neurology signed off -Continue home Keppra 1500mg  BID -Continue Lamictal 100mg  BID (increased from 100mg  QD) -Decadron 4mg  daily -Seizure precautions

## 2023-01-03 NOTE — Assessment & Plan Note (Addendum)
Resolving, Na 134 this AM

## 2023-01-04 DIAGNOSIS — R531 Weakness: Secondary | ICD-10-CM | POA: Diagnosis not present

## 2023-01-04 LAB — BASIC METABOLIC PANEL
Anion gap: 7 (ref 5–15)
BUN: 13 mg/dL (ref 8–23)
CO2: 26 mmol/L (ref 22–32)
Calcium: 8.8 mg/dL — ABNORMAL LOW (ref 8.9–10.3)
Chloride: 101 mmol/L (ref 98–111)
Creatinine, Ser: 0.64 mg/dL (ref 0.44–1.00)
GFR, Estimated: >60 mL/min (ref >60)
Glucose, Bld: 87 mg/dL (ref 70–99)
Potassium: 3.4 mmol/L — ABNORMAL LOW (ref 3.5–5.1)
Sodium: 134 mmol/L — ABNORMAL LOW (ref 135–145)

## 2023-01-04 LAB — MAGNESIUM: Magnesium: 2 mg/dL (ref 1.7–2.4)

## 2023-01-04 MED ORDER — POTASSIUM CHLORIDE CRYS ER 20 MEQ PO TBCR
40.0000 meq | EXTENDED_RELEASE_TABLET | ORAL | Status: AC
  Administered 2023-01-04 (×2): 40 meq via ORAL
  Filled 2023-01-04 (×2): qty 2

## 2023-01-04 NOTE — Plan of Care (Signed)
  Problem: Education: Goal: Knowledge of General Education information will improve Description: Including pain rating scale, medication(s)/side effects and non-pharmacologic comfort measures Outcome: Progressing   Problem: Clinical Measurements: Goal: Ability to maintain clinical measurements within normal limits will improve Outcome: Progressing Goal: Will remain free from infection Outcome: Progressing Goal: Diagnostic test results will improve Outcome: Progressing Goal: Respiratory complications will improve Outcome: Progressing Goal: Cardiovascular complication will be avoided Outcome: Progressing   Problem: Activity: Goal: Risk for activity intolerance will decrease Outcome: Progressing   Problem: Nutrition: Goal: Adequate nutrition will be maintained Outcome: Progressing   Problem: Coping: Goal: Level of anxiety will decrease Outcome: Progressing   Problem: Elimination: Goal: Will not experience complications related to bowel motility Outcome: Progressing Goal: Will not experience complications related to urinary retention Outcome: Progressing   Problem: Pain Management: Goal: General experience of comfort will improve Outcome: Progressing   Problem: Safety: Goal: Ability to remain free from injury will improve Outcome: Progressing   Problem: Skin Integrity: Goal: Risk for impaired skin integrity will decrease Outcome: Progressing

## 2023-01-04 NOTE — Care Management Important Message (Signed)
Important Message  Patient Details  Name: Lindsey Daniels MRN: [id number] Date of Birth: March 12, 1955   Important Message Given:  Yes - Medicare IM     Dorena Bodo 01/04/2023, 3:30 PM

## 2023-01-04 NOTE — NC FL2 (Signed)
[place] MEDICAID FL2 LEVEL OF CARE FORM     IDENTIFICATION  Patient Name: Lindsey Daniels Birthdate: 1955/11/07 Sex: female Admission Date (Current Location): 12/31/2022  [facility] and [place] Number:  [employer] and Address:  [facility], [address], [place], [place] [address]      Provider Number: [id number]  Attending Physician Name and Address:  Doreene Eland, MD  Relative Name and Phone Number:       Current Level of Care: Hospital Recommended Level of Care: Skilled Nursing Facility Prior Approval Number:    Date Approved/Denied:   PASRR Number: [id number]  Discharge Plan: SNF    Current Diagnoses: Patient Active Problem List   Diagnosis Date Noted   Seizure (HCC) 12/31/2022   Left-sided weakness 12/31/2022   Oliguria 01/19/2022   Status epilepticus (HCC) 01/18/2022   Acute respiratory failure (HCC) 01/18/2022   History of pulmonary embolism 01/18/2022   Hypokalemia 01/17/2022   Hypomagnesemia 01/17/2022   Hypoglycemia 01/17/2022   Pulmonary embolism (HCC) 11/18/2021   Obesity (BMI 30-39.9) 09/27/2021   Headache 09/22/2021   Acute metabolic encephalopathy 09/22/2021   Weakness of both lower extremities 09/22/2021   HTN (hypertension) 09/22/2021   Focal seizure (HCC) 07/15/2021   Depression 07/15/2021   History of colon polyps 12/27/2020   Protein-calorie malnutrition, severe 11/28/2020   Cerebral edema (HCC) 11/25/2020   Osteopenia of right hip 06/13/2020   History of melanoma excision 02/01/2020   Gastroesophageal reflux disease without esophagitis 02/01/2020   Increased body mass index 05/09/2019   Seasonal allergies 11/21/2016   Loss of height 11/21/2016   Elevated BP without diagnosis of hypertension 11/21/2016   Encounter for monitoring anticonvulsant therapy 09/01/2016   Exposure to TB 06/07/2015   HSV infection 06/07/2015   Depressed mood 06/07/2015   Carpal tunnel syndrome 03/06/2014    Spondylolysis 01/19/2014   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/24/2013   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HCC) 10/17/2013   Low back pain 10/10/2013   Pleomorphic xanthoastrocytoma of brain (HCC) 10/02/2013   Intracranial tumor (HCC) 08/28/2013   Compression fracture of lumbar spine, non-traumatic (HCC) 08/28/2013   Syncope 08/18/2013   Seizure disorder (HCC) 08/18/2013   History of benign neoplasm of brain 02/16/2013   Bilateral leg pain 09/07/2012   Foot pain, bilateral 09/07/2012   Plantar fasciitis BILATERAL 08/25/2012   Hyperlipidemia 12/24/2011    Orientation RESPIRATION BLADDER Height & Weight     Self, Place  Normal Incontinent Weight: 145 lb 1 oz (65.8 kg) Height:  5\' 3"  (160 cm)  BEHAVIORAL SYMPTOMS/MOOD NEUROLOGICAL BOWEL NUTRITION STATUS    Convulsions/Seizures Continent Diet (regular)  AMBULATORY STATUS COMMUNICATION OF NEEDS Skin   Extensive Assist Verbally Normal                       Personal Care Assistance Level of Assistance  Bathing, Feeding, Dressing Bathing Assistance: Maximum assistance Feeding assistance: Limited assistance Dressing Assistance: Maximum assistance     Functional Limitations Info             SPECIAL CARE FACTORS FREQUENCY  PT (By licensed PT), OT (By licensed OT)     PT Frequency: 5x/wk OT Frequency: 5x/wk            Contractures Contractures Info: Not present    Additional Factors Info  Code Status, Allergies Code Status Info: DNR Allergies Info: Rifapentine, Amoxicillin, Clavulanic Acid  Current Medications (01/04/2023):  This is the current hospital active medication list Current Facility-Administered Medications  Medication Dose Route Frequency Provider Last Rate Last Admin   apixaban (ELIQUIS) tablet 5 mg  5 mg Oral BID Caryl Pina, MD   5 mg at 01/04/23 0905   atorvastatin (LIPITOR) tablet 20 mg  20 mg Oral Daily Alfredo Martinez, MD   20 mg at 01/04/23 0905    dexamethasone (DECADRON) tablet 4 mg  4 mg Oral Daily Caryl Pina, MD   4 mg at 01/04/23 0905   lamoTRIgine (LAMICTAL) tablet 150 mg  150 mg Oral BID Celine Mans, MD   150 mg at 01/04/23 0905   levETIRAcetam (KEPPRA) tablet 1,500 mg  1,500 mg Oral BID Caryl Pina, MD   1,500 mg at 01/04/23 0905   pantoprazole (PROTONIX) EC tablet 40 mg  40 mg Oral Daily Alfredo Martinez, MD   40 mg at 01/04/23 0905   sertraline (ZOLOFT) tablet 150 mg  150 mg Oral Daily Alfredo Martinez, MD   150 mg at 01/04/23 2440     Discharge Medications: Please see discharge summary for a list of discharge medications.  Relevant Imaging Results:  Relevant Lab Results:   Additional Information SS#: [id number]  Baldemar Lenis, LCSW

## 2023-01-04 NOTE — Progress Notes (Signed)
     Daily Progress Note Intern Pager: [phone]  Patient name: Lindsey Daniels Medical record number: [id number] Date of birth: 1955/07/06 Age: 67 y.o. Gender: female  Primary Care Provider: Porfirio Oar, PA Consultants: oncology, neurology Code Status: DNR  Pt Overview and Major Events to Date:  11/14: Admitted to FMTS   Assessment and Plan:  67 year old female with PMHx HLD, GERD, depression, HTN presenting with worsening L sided weakness and change in speech likely due to breakthrough seizures in the setting of known pleomorphic xanthoastrocytoma.  Pending SNF bed at [facility], TOC notified. Assessment & Plan Seizure [facility]) EEG negative for seizure but showed epileptogenicity in area of encephalomalacia. No further seizures events during hospitalization. 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 Echo obtained with EF 50-55% and LBBB -Neurooncologist outpatient f/u (Dr. Barbaraann Cao)   Chronic and Stable Problems:  HLD - continue lipitor 20mg  every day GERD - continue protonix 40mg  every day Depression - continue zoloft 150mg  HTN - holding home amlodipine and losartan (continues to be normotensive)   FEN/GI: regular PPx: eliquis 5mg  BID Dispo:pending SNF placement  Subjective:  Patient had a 6 beat run of V. tach overnight that was asymptomatic.  BMP was ordered, potassium was 3.4.  Potassium repleted by night team.  Patient is asymptomatic this morning, has no concerns.  Objective: Temp:  [97.7 F (36.5 C)-99.6 F (37.6 C)] 99.6 F (37.6 C) (11/18 0700) Pulse Rate:  [71-90] 71  (11/18 0700) Resp:  [16-18] 16 (11/18 0700) BP: (113-144)/(75-86) 144/77 (11/18 0700) SpO2:  [94 %-100 %] 100 % (11/18 0700) Physical Exam: General: No acute distress, resting in bed Cardiovascular: RRR, no murmurs Respiratory: no increased WOB on RA Extremities: Able to slightly lift LUE on command, able to lift RUE on command. 4/5 grip strength RUE, 1/5 grip strength LUE.   Laboratory: Most recent CBC Lab Results  Component Value Date   WBC 7.6 01/02/2023   HGB 13.4 01/02/2023   HCT 39.4 01/02/2023   MCV 87.6 01/02/2023   PLT 167 01/02/2023   Most recent BMP    Latest Ref Rng & Units 01/04/2023    4:56 AM  BMP  Glucose 70 - 99 mg/dL 87   BUN 8 - 23 mg/dL 13   Creatinine 7.56 - 1.00 mg/dL 4.33   Sodium 295 - 188 mmol/L 134   Potassium 3.5 - 5.1 mmol/L 3.4   Chloride 98 - 111 mmol/L 101   CO2 22 - 32 mmol/L 26   Calcium 8.9 - 10.3 mg/dL 8.8    Francee Setzer, DO 01/04/2023, 8:58 AM  PGY-1, [facility] FPTS Intern pager: [phone], text pages welcome Secure chat group [facility] [employer] Teaching Service

## 2023-01-04 NOTE — Progress Notes (Addendum)
Pt had 6 beats run of vtach. Pt was asymptomatic. VSS. On call provider notified. New orders received for BMP and Mg.   01/04/23 0115  Vitals  Temp 98.3 F (36.8 C)  Temp Source Oral  BP 121/75  MAP (mmHg) 90  BP Location Left Arm  BP Method Automatic  Patient Position (if appropriate) Lying  Pulse Rate 90  Pulse Rate Source Monitor  Resp 18  Level of Consciousness  Level of Consciousness Alert  MEWS COLOR  MEWS Score Color Green  Oxygen Therapy  SpO2 96 %  O2 Device Room Air  Pain Assessment  Pain Scale 0-10  Pain Score 0  MEWS Score  MEWS Temp 0  MEWS Systolic 0  MEWS Pulse 0  MEWS RR 0  MEWS LOC 0  MEWS Score 0  Provider Notification  Provider Name/Title Dr. Gilman Buttner  Date Provider Notified 01/04/23  Time Provider Notified 0121  Method of Notification Page  Notification Reason Other (Comment) (6 beats run of vtach)  Provider response Evaluate remotely;No new orders  Date of Provider Response 01/04/23  Time of Provider Response [phone]

## 2023-01-04 NOTE — Assessment & Plan Note (Addendum)
Progression of disease, considering initiation of chemotherapy outpatient. Echo obtained with EF 50-55% and LBBB -Neurooncologist outpatient f/u (Dr. Barbaraann Cao)

## 2023-01-04 NOTE — TOC Initial Note (Signed)
Transition of Care [facility]) - Initial/Assessment Note    Patient Details  Name: Lindsey Daniels MRN: [id number] Date of Birth: [birth year]  Transition [facility]) CM/SW Contact:    Baldemar Lenis, LCSW Phone Number: 01/04/2023, 1:21 PM  Clinical Narrative:     CSW coordinated with PT seeing patient today as patient noted preference ot return to ALF if possible. Per PT, patient still appropriate for SNF. CSW attempted to meet with patient but she would not wake up to engage in discussion. CSW contacted brother, Lindsey Daniels, to discuss recommendation for SNF. Lindsey Daniels in agreement, preference for [facility]. CSW completed referral, sent to [facility] for review. CSW to follow.          Expected Discharge Plan: Skilled Nursing Facility Barriers to Discharge: Continued Medical Work up, English as a second language teacher   Patient Goals and CMS Choice Patient states their goals for this hospitalization and ongoing recovery are:: patient unable to participate in goal setting, not oriented CMS Medicare.gov Compare Post Acute Care list provided to:: Patient Represenative (must comment) Choice offered to / list presented to : Sibling [place] ownership interest in [facility].provided to:: Sibling    Expected Discharge Plan and Services     Post Acute Care Choice: Skilled Nursing Facility Living arrangements for the past 2 months: Assisted Living Facility                                      Prior Living Arrangements/Services Living arrangements for the past 2 months: Assisted Living Facility Lives with:: Facility Resident Patient language and need for interpreter reviewed:: No Do you feel safe going back to the place where you live?: Yes      Need for Family Participation in Patient Care: Yes (Comment) Care giver support system in place?: No (comment) Current home services: DME Criminal Activity/Legal Involvement Pertinent to Current Situation/Hospitalization: No - Comment as  needed  Activities of Daily Living   ADL Screening (condition at time of admission) Independently performs ADLs?: No Does the patient have a NEW difficulty with bathing/dressing/toileting/self-feeding that is expected to last >3 days?: Yes (Initiates electronic notice to provider for possible OT consult) Does the patient have a NEW difficulty with getting in/out of bed, walking, or climbing stairs that is expected to last >3 days?: Yes (Initiates electronic notice to provider for possible PT consult) Does the patient have a NEW difficulty with communication that is expected to last >3 days?: Yes (Initiates electronic notice to provider for possible SLP consult) Is the patient deaf or have difficulty hearing?: No Does the patient have difficulty seeing, even when wearing glasses/contacts?: No Does the patient have difficulty concentrating, remembering, or making decisions?: Yes  Permission Sought/Granted Permission sought to share information with : Facility Medical sales representative, Family Supports Permission granted to share information with : Yes, Verbal Permission Granted  Share Information with NAME: Lindsey Daniels  Permission granted to share info w AGENCY: SNF  Permission granted to share info w Relationship: Brother     Emotional Assessment Appearance:: Appears stated age Attitude/Demeanor/Rapport: Unable to Assess Affect (typically observed): Unable to Assess Orientation: : Oriented to Self, Oriented to Place Alcohol / Substance Use: Not Applicable Psych Involvement: No (comment)  Admission diagnosis:  Hyponatremia [E87.1] Seizure (HCC) [R56.9] Thrombocytopenia (HCC) [D69.6] Left-sided weakness [R53.1] Patient Active Problem List   Diagnosis Date Noted   Seizure (HCC) 12/31/2022   Left-sided weakness 12/31/2022  Oliguria 01/19/2022   Status epilepticus (HCC) 01/18/2022   Acute respiratory failure (HCC) 01/18/2022   History of pulmonary embolism 01/18/2022   Hypokalemia 01/17/2022    Hypomagnesemia 01/17/2022   Hypoglycemia 01/17/2022   Pulmonary embolism (HCC) 11/18/2021   Obesity (BMI 30-39.9) 09/27/2021   Headache 09/22/2021   Acute metabolic encephalopathy 09/22/2021   Weakness of both lower extremities 09/22/2021   HTN (hypertension) 09/22/2021   Focal seizure (HCC) 07/15/2021   Depression 07/15/2021   History of colon polyps 12/27/2020   Protein-calorie malnutrition, severe 11/28/2020   Cerebral edema (HCC) 11/25/2020   Osteopenia of right hip 06/13/2020   History of melanoma excision 02/01/2020   Gastroesophageal reflux disease without esophagitis 02/01/2020   Increased body mass index 05/09/2019   Seasonal allergies 11/21/2016   Loss of height 11/21/2016   Elevated BP without diagnosis of hypertension 11/21/2016   Encounter for monitoring anticonvulsant therapy 09/01/2016   Exposure to TB 06/07/2015   HSV infection 06/07/2015   Depressed mood 06/07/2015   Carpal tunnel syndrome 03/06/2014   Spondylolysis 01/19/2014   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 [place] [address] Phone: [phone] Fax: [phone]  [facility] [address] [place] [address] Phone: [phone] Fax: [phone]  [facility] [address] [place] [address] Phone: [phone] Fax: [phone]     Social Determinants of Health (SDOH) Social History: SDOH Screenings   Food Insecurity: Patient Unable To Answer (12/31/2022)  Housing: High Risk (12/31/2022)  Transportation Needs: Patient Unable To Answer (12/31/2022)  Utilities: Patient Unable To Answer (12/31/2022)  Financial Resource Strain: Low Risk  (01/15/2022)  Physical Activity: Inactive (02/03/2021)   [facility] from [facility]  Social Connections: Unknown (06/16/2021)   [facility] from [facility]  Stress: Stress Concern Present (02/03/2021)   Received from [facility], [facility]  Tobacco Use: Low Risk  (12/31/2022)   SDOH Interventions:     Readmission Risk Interventions     No data to display

## 2023-01-04 NOTE — Plan of Care (Signed)
Spoke with brother Mr. Worley to update him on status of patient. He provided her name and DOB.

## 2023-01-04 NOTE — Assessment & Plan Note (Addendum)
Improved, likely in the setting of Todd's paralysis  -Awaiting return to [facility] (SNF)

## 2023-01-04 NOTE — Assessment & Plan Note (Addendum)
EEG negative for seizure but showed epileptogenicity in area of encephalomalacia. No further seizures events during hospitalization. Seizures occurring in the setting of known brain tumors -Neurology signed off -Continue home Keppra 1500mg  BID -Continue Lamictal 100mg  BID (increased from 100mg  QD) -Decadron 4mg  daily -Seizure precautions

## 2023-01-04 NOTE — Progress Notes (Signed)
Physical Therapy Treatment Patient Details Name: Lindsey Daniels MRN: [id number] DOB: [birth year] Today's Date: 01/04/2023   History of Present Illness Pt is a 67 y.o. female who presented 12/31/22 with slurred speech and L-sided weakness this morning. Per chart, pt had fall and prolonged seizure at her facility the night prior. CT head and MRI brain were negative for intracranial bleeding or acute intracranial pathology. Imaging showed stable right temporal mass with resolving brain edema. 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 She is agreeable to PT session. Patient difficult to get clear answers from during session. She required max assist to perform side lying to sit with cues for how to perform. Patient is able to sit edge of bed, once assisted there with use of bed pad (max assist). She is able to stand with R UE on chair and UE support on L side. Patient reports she is feeling uncomfortable with this and is unable to attempt weight shifting in standing. Patient then trying to lie down with head at feet of bed. Requires max assist to return to side lying in correct direction. Patient will continue to benefit from skilled PT to improve functional mobility and independence.       If plan is discharge home, recommend the following: Two people to help with walking and/or transfers;A lot of help with bathing/dressing/bathroom;Assist for transportation   Can travel by private vehicle     No  Equipment Recommendations  None recommended by PT (TBD)    Recommendations for Other Services       Precautions / Restrictions Precautions Precautions: Fall Restrictions Weight Bearing Restrictions: No     Mobility  Bed Mobility Overal bed mobility: Needs Assistance Bed  Mobility: Sidelying to Sit, Rolling, Sit to Sidelying Rolling: Mod assist Sidelying to sit: Max assist     Sit to sidelying: Max assist General bed mobility comments: Patient required max assist to raise trunk to seated position. Difficulty with problem solving, needs cues to release grip on bed/char to perform mobility. Patient trying to lie down toward foot of bed, requiring max assist to get back into bed.    Transfers Overall transfer level: Needs assistance Equipment used: 1 person hand held assist Transfers: Sit to/from Stand Sit to Stand: Mod assist           General transfer comment: Patient able to stand holding to chair with R UE and mod A from me. Was unable to shift weight or attempt to take a step.    Ambulation/Gait               General Gait Details: pt unable to take steps along EOB when cued   Stairs             Wheelchair Mobility     Tilt Bed    Modified Rankin (Stroke Patients Only) Modified Rankin (Stroke Patients Only) Pre-Morbid Rankin Score: Moderate disability Modified Rankin: Severe disability     Balance Overall balance assessment: Needs assistance Sitting-balance support: Feet supported Sitting balance-Leahy Scale: Fair Sitting balance - Comments: Cues needed to scoot back onto bed for safety.   Standing balance support: Bilateral upper extremity supported Standing balance-Leahy Scale: Fair Standing balance comment: Able to statically stand but is unable to attempt weight shifting  Cognition Arousal: Alert Behavior During Therapy: Flat affect Overall Cognitive Status: No family/caregiver present to determine baseline cognitive functioning Area of Impairment: Following commands, Safety/judgement, Awareness, Problem solving                 Orientation Level: Disoriented to, Time, Situation Current Attention Level: Focused Memory: Decreased short-term memory Following  Commands: Follows one step commands inconsistently, Follows one step commands with increased time Safety/Judgement: Decreased awareness of safety, Decreased awareness of deficits Awareness: Intellectual Problem Solving: Slow processing, Difficulty sequencing, Requires verbal cues, Requires tactile cues, Decreased initiation General Comments: Patient needs max cues for directing to get back into bed. Poor awareness of limitations at times. Poor problem solving.        Exercises      General Comments        Pertinent Vitals/Pain Pain Assessment Faces Pain Scale: No hurt    Home Living                          Prior Function            PT Goals (current goals can now be found in the care plan section) Acute Rehab PT Goals Patient Stated Goal: none stated PT Goal Formulation: Patient unable to participate in goal setting Time For Goal Achievement: 01/14/23 Progress towards PT goals: Progressing toward goals    Frequency    Min 1X/week      PT Plan      Co-evaluation              AM-PAC PT "6 Clicks" Mobility   Outcome Measure  Help needed turning from your back to your side while in a flat bed without using bedrails?: A Lot Help needed moving from lying on your back to sitting on the side of a flat bed without using bedrails?: A Lot Help needed moving to and from a bed to a chair (including a wheelchair)?: Total Help needed standing up from a chair using your arms (e.g., wheelchair or bedside chair)?: A Lot Help needed to walk in hospital room?: Total Help needed climbing 3-5 steps with a railing? : Total 6 Click Score: 9    End of Session Equipment Utilized During Treatment: Gait belt Activity Tolerance: Patient tolerated treatment well Patient left: in bed;with call bell/phone within reach;with bed alarm set Nurse Communication: Mobility status PT Visit Diagnosis: Other abnormalities of gait and mobility (R26.89);Muscle weakness (generalized)  (M62.81);Other symptoms and signs involving the nervous system (R29.898)     Time: 0981-1914 PT Time Calculation (min) (ACUTE ONLY): 17 min  Charges:    $Therapeutic Activity: 8-22 mins PT General Charges $$ ACUTE PT VISIT: 1 Visit                     Burak Zerbe, PT, GCS 01/04/23,11:12 AM

## 2023-01-05 DIAGNOSIS — R531 Weakness: Secondary | ICD-10-CM | POA: Diagnosis not present

## 2023-01-05 NOTE — Assessment & Plan Note (Addendum)
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

## 2023-01-05 NOTE — Assessment & Plan Note (Addendum)
EEG negative for seizure but showed epileptogenicity in area of encephalomalacia. No further seizures events during hospitalization. Seizures occurring in the setting of known brain tumors -Neurology signed off -Continue home Keppra 1500mg  BID -Continue Lamictal 100mg  BID (increased from 100mg  QD) -Decadron 4mg  daily -Seizure precautions

## 2023-01-05 NOTE — Assessment & Plan Note (Addendum)
Progression of disease, considering initiation of chemotherapy outpatient. Echo obtained with EF 50-55% and LBBB -Neurooncologist outpatient f/u (Dr. Barbaraann Cao)

## 2023-01-05 NOTE — Progress Notes (Signed)
     Daily Progress Note Intern Pager: [phone]  Patient name: Lindsey Daniels Medical record number: [id number] Date of birth: 1956/01/05 Age: 67 y.o. 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  TOC in process of getting pt referred to [facility]. Assessment & Plan Seizure [facility]) EEG negative for seizure but showed epileptogenicity in area of encephalomalacia. No further seizures events during hospitalization. 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 Echo obtained with EF 50-55% and LBBB -Neurooncologist outpatient f/u (Dr. Barbaraann Cao)   Chronic and Stable Problems:  HLD - continue lipitor 20mg  every day GERD - continue protonix 40mg  every day Depression - continue zoloft 150mg  HTN - holding home amlodipine and losartan (continues to be normotensive)   FEN/GI: regular PPx: eliquis 5mg  BID Dispo:pending rehab placement  Subjective:  No acute events overnight.  Patient has no complaints or concerns today but states that she is sleepy  Objective: Temp:  [97.8 F (36.6 C)-99.7 F (37.6 C)] 99 F (37.2 C) (11/19 0308) Pulse Rate:  [67-85] 74 (11/19 0308) Resp:  [15-18] 18 (11/19 0308) BP: (122-160)/(67-84) 160/84 (11/19 0308) SpO2:   [94 %-98 %] 94 % (11/19 0308) Physical Exam: General: Resting in bed, no acute distress Respiratory: No increased work of breathing on room air Extremities: Patient able to lift bilateral arms on command, this is much improved from previous exam.  1 out of 5 grip strength left upper extremity, 4 out of 5 grip strength right upper extremity.  Patient able to lift bilateral lower extremities on command, R>L  Laboratory: Most recent CBC Lab Results  Component Value Date   WBC 7.6 01/02/2023   HGB 13.4 01/02/2023   HCT 39.4 01/02/2023   MCV 87.6 01/02/2023   PLT 167 01/02/2023   Most recent BMP    Latest Ref Rng & Units 01/04/2023    4:56 AM  BMP  Glucose 70 - 99 mg/dL 87   BUN 8 - 23 mg/dL 13   Creatinine 1.32 - 1.00 mg/dL 4.40   Sodium 102 - 725 mmol/L 134   Potassium 3.5 - 5.1 mmol/L 3.4   Chloride 98 - 111 mmol/L 101   CO2 22 - 32 mmol/L 26   Calcium 8.9 - 10.3 mg/dL 8.8     Vraj Denardo, DO 01/05/2023, 8:49 AM  PGY-1, [facility] FPTS Intern pager: [phone], text pages welcome Secure chat group [facility] Teaching Service

## 2023-01-05 NOTE — Plan of Care (Signed)
  Problem: Education: Goal: Knowledge of General Education information will improve Description: Including pain rating scale, medication(s)/side effects and non-pharmacologic comfort measures Outcome: Progressing   Problem: Clinical Measurements: Goal: Ability to maintain clinical measurements within normal limits will improve Outcome: Progressing Goal: Will remain free from infection Outcome: Progressing Goal: Diagnostic test results will improve Outcome: Progressing Goal: Respiratory complications will improve Outcome: Progressing Goal: Cardiovascular complication will be avoided Outcome: Progressing   Problem: Activity: Goal: Risk for activity intolerance will decrease Outcome: Progressing   Problem: Nutrition: Goal: Adequate nutrition will be maintained Outcome: Progressing   Problem: Coping: Goal: Level of anxiety will decrease Outcome: Progressing   Problem: Elimination: Goal: Will not experience complications related to bowel motility Outcome: Progressing Goal: Will not experience complications related to urinary retention Outcome: Progressing   Problem: Pain Management: Goal: General experience of comfort will improve Outcome: Progressing   Problem: Safety: Goal: Ability to remain free from injury will improve Outcome: Progressing   Problem: Skin Integrity: Goal: Risk for impaired skin integrity will decrease Outcome: Progressing

## 2023-01-05 NOTE — Progress Notes (Signed)
Occupational Therapy Treatment Patient Details Name: Lindsey Daniels MRN: [id number] DOB: August 11, 1955 Today's Date: 01/05/2023   History of present illness Pt is a 67 y.o. female who presented 12/31/22 with slurred speech and L-sided weakness this morning. Per chart, pt had fall and prolonged seizure at her facility the night prior. CT head and MRI brain were negative for intracranial bleeding or acute intracranial pathology. Imaging showed stable right temporal mass with resolving brain edema. 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  Able to sit EOB with up to Min A for balance, Mod A for sit to stand and transfers.  Poor balance and small shuffle steps to recliner.  Patient with increased spillage noted with breakfast, may need to assess for scoop plate.  OT to continue efforts in the acute setting to address deficits, and Patient will benefit from continued inpatient follow up therapy, <3 hours/day       If plan is discharge home, recommend the following:  A lot of help with bathing/dressing/bathroom;Two people to help with walking and/or transfers;Assist for transportation;Help with stairs or ramp for entrance;Assistance with feeding;Assistance with cooking/housework;Direct supervision/assist for financial management;Direct supervision/assist for medications management;Supervision due to cognitive status   Equipment Recommendations  None recommended by OT    Recommendations for Other Services      Precautions / Restrictions Precautions Precautions: Fall Restrictions Weight Bearing Restrictions: No       Mobility Bed Mobility Overal bed mobility: Needs Assistance Bed Mobility: Supine to Sit     Supine to sit: Mod assist, HOB elevated           Transfers Overall transfer level: Needs assistance Equipment used: 1 person hand held assist Transfers: Sit to/from Stand, Bed to chair/wheelchair/BSC Sit to Stand: Min assist, Mod assist     Step pivot transfers: Mod assist     General transfer comment: small shuffle steps, needs increased time     Balance Overall balance assessment: Needs assistance Sitting-balance support: Feet supported, Single extremity supported Sitting balance-Leahy Scale: Poor   Postural control: Posterior lean Standing balance support: Bilateral upper extremity supported Standing balance-Leahy Scale: Poor                             ADL either performed or assessed with clinical judgement   ADL           Upper Body Bathing: Moderate assistance;Sitting Upper Body Bathing Details (indicate cue type and reason): poor sitting balance Lower Body Bathing: Maximal assistance;Sitting/lateral leans;Sit to/from stand   Upper Body Dressing : Moderate assistance;Sitting   Lower Body Dressing: Maximal assistance;Sit to/from stand;Sitting/lateral leans   Toilet Transfer: Moderate assistance;Stand-pivot                  Extremity/Trunk Assessment Upper Extremity Assessment Upper Extremity Assessment: LUE deficits/detail LUE Deficits / Details: Chronic weakness to LUE due to tumor LUE Sensation: decreased light touch;decreased proprioception LUE Coordination: decreased gross motor;decreased fine motor   Lower Extremity Assessment Lower Extremity Assessment: Defer to PT evaluation   Cervical / Trunk Assessment Cervical / Trunk Assessment: Kyphotic    Vision Patient Visual Report: No change from baseline     Perception Perception Perception: Not tested   Praxis Praxis Praxis: Not tested  Cognition Arousal: Alert Behavior During Therapy: WFL for tasks assessed/performed Overall Cognitive Status: Impaired/Different from baseline                   Orientation  Level: Disoriented to, Time Current Attention Level: Sustained   Following Commands: Follows one step commands with increased time   Awareness: Emergent Problem Solving: Requires verbal cues, Requires tactile cues                             Pertinent Vitals/ Pain       Pain Assessment Pain Assessment: No/denies pain                                                          Frequency  Min 1X/week        Progress Toward Goals  OT Goals(current goals can now be found in the care plan section)  Progress towards OT goals: Progressing toward goals  Acute Rehab OT Goals Time For Goal Achievement: 01/14/23 Potential to Achieve Goals: Fair  Plan      Co-evaluation                 AM-PAC OT "6 Clicks" Daily Activity     Outcome Measure   Help from another person eating meals?: A Little Help from another person taking care of personal grooming?: A Little Help from another person toileting, which includes using toliet, bedpan, or urinal?: A Lot Help from another person bathing (including washing, rinsing, drying)?: A Lot Help from another person to put on and taking off regular upper body clothing?: A Lot Help from another person to put on and taking off regular lower body clothing?: A Lot 6 Click Score: 14    End of Session    OT Visit Diagnosis: Muscle weakness (generalized) (M62.81);History of falling (Z91.81);Other symptoms and signs involving cognitive function   Activity Tolerance Patient tolerated treatment well   Patient Left in chair;with call bell/phone within reach;with chair alarm set   Nurse Communication Mobility status        Time: 1610-9604 OT Time Calculation (min): 19 min  Charges: OT General Charges $OT Visit: 1 Visit OT Treatments $Self Care/Home Management : 8-22 mins  01/05/2023  RP, OTR/L  Acute Rehabilitation Services  Office:  [phone]   Lindsey Daniels 01/05/2023, 11:23 AM

## 2023-01-05 NOTE — Plan of Care (Signed)
  Problem: Education: Goal: Knowledge of General Education information will improve Description: Including pain rating scale, medication(s)/side effects and non-pharmacologic comfort measures Outcome: Progressing   Problem: Activity: Goal: Risk for activity intolerance will decrease Outcome: Progressing   Problem: Nutrition: Goal: Adequate nutrition will be maintained Outcome: Progressing   Problem: Elimination: Goal: Will not experience complications related to bowel motility Outcome: Progressing   Problem: Pain Management: Goal: General experience of comfort will improve Outcome: Progressing   Problem: Safety: Goal: Ability to remain free from injury will improve Outcome: Progressing   Problem: Skin Integrity: Goal: Risk for impaired skin integrity will decrease Outcome: Progressing

## 2023-01-05 NOTE — TOC Progression Note (Signed)
Transition of Care [facility]) - Progression Note    Patient Details  Name: Lindsey Daniels MRN: [id number] Date of Birth: 24-Jul-1955  Transition of Care [facility]) CM/SW Contact  Baldemar Lenis, [place] Phone Number: 01/05/2023, 12:25 PM  Clinical Narrative:   CSW confirmed with [facility] that they can offer a bed for patient. CSW requested for [place] to initiate insurance authorization. CSW spoke with brother, Meredith Staggers, to provide update. CSW to follow.    Expected Discharge Plan: Skilled Nursing Facility Barriers to Discharge: Continued Medical Work up, English as a second language teacher  Expected Discharge Plan and Services     Post Acute Care Choice: Skilled Nursing Facility Living arrangements for the past 2 months: Assisted Living Facility                                       Social Determinants of Health (SDOH) Interventions SDOH Screenings   Food Insecurity: Patient Unable To Answer (12/31/2022)  Housing: High Risk (12/31/2022)  Transportation Needs: Patient Unable To Answer (12/31/2022)  Utilities: Patient Unable To Answer (12/31/2022)  Financial Resource Strain: Low Risk  (01/15/2022)  Physical Activity: Inactive (02/03/2021)   Received from [facility], [facility]  Social Connections: Unknown (06/16/2021)   Received from [facility], [facility]  Stress: Stress Concern Present (02/03/2021)   Received from [facility], [facility]  Tobacco Use: Low Risk  (12/31/2022)    Readmission Risk Interventions     No data to display

## 2023-01-06 DIAGNOSIS — R531 Weakness: Secondary | ICD-10-CM | POA: Diagnosis not present

## 2023-01-06 MED ORDER — NAYZILAM 5 MG/0.1ML NA SOLN: 5.0000 mg | Freq: Four times a day (QID) | NASAL | 0 refills | Status: DC | PRN

## 2023-01-06 MED ORDER — LAMOTRIGINE 150 MG PO TABS: 150.0000 mg | ORAL_TABLET | Freq: Two times a day (BID) | ORAL | Status: DC

## 2023-01-06 NOTE — TOC Transition Note (Signed)
Transition of Care [facility]) - CM/SW Discharge Note   Patient Details  Name: Lindsey Daniels MRN: [id number] Date of Birth: Feb 12, 1956  Transition of Care [facility]) CM/SW Contact:  [name] [name], Student-Social Work Phone Number: 01/06/2023, 1:36 PM   Clinical Narrative:   MSW Student received insurance approval for patient ti admit to [facility]. MSW Student confirmed with MD that patient is stable for discharge. MSW Student notified [name] and they are in agreement with Discharge. MSW Student Confirmed bed is available at [facility] Transport arranged with [facility] for next available.   Number to call report:[phone] RM: [phone]      Final next level of care: Skilled Nursing Facility Barriers to Discharge: Barriers Resolved   Patient Goals and CMS Choice CMS Medicare.gov Compare Post Acute Care list provided to:: Patient Represenative (must comment) Choice offered to / list presented to : Sibling  Discharge Placement                Patient chooses bed at:  [facility]) Patient to be transferred to facility by: [facility] Name of family member notified: [name] Patient and family notified of of transfer: 01/06/23  Discharge Plan and Services Additional resources added to the After Visit Summary for       Post Acute Care Choice: Skilled Nursing Facility                               Social Determinants of Health (SDOH) Interventions SDOH Screenings   Food Insecurity: Patient Unable To Answer (12/31/2022)  Housing: High Risk (12/31/2022)  Transportation Needs: Patient Unable To Answer (12/31/2022)  Utilities: Patient Unable To Answer (12/31/2022)  Financial Resource Strain: Low Risk  (01/15/2022)  Physical Activity: Inactive (02/03/2021)   Received from [facility], [facility]  Social Connections: Unknown (06/16/2021)   Received from [facility], [facility]  Stress: Stress Concern Present (02/03/2021)   Received from [facility], [facility]   Tobacco Use: Low Risk  (12/31/2022)     Readmission Risk Interventions     No data to display

## 2023-01-06 NOTE — Assessment & Plan Note (Addendum)
Progression of disease, considering initiation of chemotherapy outpatient. Echo obtained with EF 50-55% and LBBB -Neurooncologist outpatient f/u (Dr. Barbaraann Cao)

## 2023-01-06 NOTE — Assessment & Plan Note (Addendum)
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

## 2023-01-06 NOTE — Progress Notes (Signed)
     Daily Progress Note Intern Pager: [phone]  Patient name: Lindsey Daniels Medical record number: [id number] Date of birth: [birth year] Age: 67 y.o. 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  TOC in process of getting pt referred to [facility], pending insurance auth Assessment & Plan Seizure [facility]) EEG negative for seizure but showed epileptogenicity in area of encephalomalacia. No further seizures events during hospitalization. 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 Echo obtained with EF 50-55% and LBBB -Neurooncologist outpatient f/u (Dr. Barbaraann Cao)   Chronic and Stable Problems:  HLD - continue lipitor 20mg  every day GERD - continue protonix 40mg  every day Depression - continue zoloft 150mg  HTN - holding home amlodipine and losartan (continues to be normotensive)   FEN/GI: regular PPx: eliquis 5mg  BID Dispo:pending rehab placement  Subjective:  No acute events overnight. No concerns/complaints today.  Objective: Temp:  [98.1 F (36.7 C)-99.3 F (37.4 C)] 99.3 F (37.4 C) (11/20 0727) Pulse Rate:  [70-85] 85 (11/20 0727) Resp:  [17-20] 18 (11/20 0727) BP: (108-144)/(64-77) 144/75 (11/20 0727) SpO2:  [93 %-98 %] 97 %  (11/20 0727) Physical Exam: General: no acute distress Respiratory: No increased work of breathing on room air Extremities: Able to shift up in bed mainly with right arm  Laboratory: Most recent CBC Lab Results  Component Value Date   WBC 7.6 01/02/2023   HGB 13.4 01/02/2023   HCT 39.4 01/02/2023   MCV 87.6 01/02/2023   PLT 167 01/02/2023   Most recent BMP    Latest Ref Rng & Units 01/04/2023    4:56 AM  BMP  Glucose 70 - 99 mg/dL 87   BUN 8 - 23 mg/dL 13   Creatinine 9.56 - 1.00 mg/dL 2.13   Sodium 086 - 578 mmol/L 134   Potassium 3.5 - 5.1 mmol/L 3.4   Chloride 98 - 111 mmol/L 101   CO2 22 - 32 mmol/L 26   Calcium 8.9 - 10.3 mg/dL 8.8    Airi Copado, DO 01/06/2023, 10:33 AM  PGY-1, [facility] FPTS Intern pager: [phone], text pages welcome Secure chat group [facility] Teaching Service

## 2023-01-06 NOTE — Assessment & Plan Note (Addendum)
EEG negative for seizure but showed epileptogenicity in area of encephalomalacia. No further seizures events during hospitalization. Seizures occurring in the setting of known brain tumors -Neurology signed off -Continue home Keppra 1500mg  BID -Continue Lamictal 100mg  BID (increased from 100mg  QD) -Decadron 4mg  daily -Seizure precautions

## 2023-01-06 NOTE — Plan of Care (Signed)
  Problem: Education: Goal: Knowledge of General Education information will improve Description: Including pain rating scale, medication(s)/side effects and non-pharmacologic comfort measures Outcome: Progressing   Problem: Clinical Measurements: Goal: Ability to maintain clinical measurements within normal limits will improve Outcome: Progressing   Problem: Coping: Goal: Level of anxiety will decrease Outcome: Progressing   Problem: Elimination: Goal: Will not experience complications related to bowel motility Outcome: Progressing   Problem: Safety: Goal: Ability to remain free from injury will improve Outcome: Progressing   Problem: Skin Integrity: Goal: Risk for impaired skin integrity will decrease Outcome: Progressing

## 2023-01-06 NOTE — Progress Notes (Signed)
Patient discharged to [facility], transported by [facility]

## 2023-01-06 NOTE — Progress Notes (Signed)
Physical Therapy Treatment Patient Details Name: Lindsey Daniels MRN: [id number] DOB: 1955/05/01 Today's Date: 01/06/2023   History of Present Illness Pt is a 67 y.o. female who presented 12/31/22 with slurred speech and L-sided weakness this morning. Per chart, pt had fall and prolonged seizure at her facility the night prior. CT head and MRI brain were negative for intracranial bleeding or acute intracranial pathology. Imaging showed stable right temporal mass with resolving brain edema. 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 Pt requiring max A to come to sitting EOB x2 as pt returning to sidelying with each attempt at sitting up EOB due to nausea. Pt without c/o dizziness and declining transfer to chair. Pt demonstrating some confusion once returned to supine asking location of cafeteria and stating "I need to go there so I can get breakfast". Pt tolerating bed in chair position at end of session with breakfast tray set up and RN in room and aware of symptoms and confusion. Current plan remains appropriate to address deficits and maximize functional independence and decrease caregiver burden. Pt continues to benefit from skilled PT services to progress toward functional mobility goals.      If plan is discharge home, recommend the following: Two people to help with walking and/or transfers;A lot of help with bathing/dressing/bathroom;Assist for transportation   Can travel by private vehicle     No  Equipment Recommendations  None recommended by PT (TBD)    Recommendations for Other Services       Precautions / Restrictions Precautions Precautions: Fall Precaution Comments: watch HR Restrictions Weight Bearing Restrictions: No      Mobility  Bed Mobility Overal bed mobility: Needs Assistance Bed Mobility: Supine to Sit Rolling: Mod assist Sidelying to sit: Max assist     Sit to sidelying: Mod assist General bed mobility comments: Patient required max assist to raise trunk to seated position. Difficulty with problem solving, needs cues to release grip on bed to perform mobility.    Transfers Overall transfer level: Needs assistance                 General transfer comment: pt stating she cannot get up and unwilling to attempt standing after stating "get me out of this bed"    Ambulation/Gait                   Stairs             Wheelchair Mobility     Tilt Bed    Modified Rankin (Stroke Patients Only) Modified Rankin (Stroke Patients Only) Pre-Morbid Rankin Score: Moderate disability Modified Rankin: Severe disability     Balance Overall balance assessment: Needs assistance Sitting-balance support: Feet supported, Single extremity supported Sitting balance-Leahy Scale: Poor Sitting balance - Comments: R lateral lean and leaning anterior against this PTA Postural control: Right lateral lean                                  Cognition Arousal: Alert Behavior During Therapy: WFL for tasks assessed/performed Overall Cognitive Status: Impaired/Different from baseline Area of Impairment: Following commands, Safety/judgement, Awareness, Problem solving  Orientation Level: Disoriented to, Time Current Attention Level: Sustained Memory: Decreased short-term memory Following Commands: Follows one step commands with increased time Safety/Judgement: Decreased awareness of safety, Decreased awareness of deficits Awareness: Emergent Problem Solving: Requires verbal cues, Requires tactile cues General Comments: Patient needs max cues sequencing to EOB. Poor awareness of limitations at times as pt asking to go to cafateria after stating she has  to go back to bed. Poor problem solving.        Exercises General Exercises - Lower Extremity Ankle Circles/Pumps: AROM, Both, 10 reps, Supine Heel Slides: AROM, Right, AAROM, Left, 10 reps, Supine Straight Leg Raises: AROM, AAROM, Right, Left, 5 reps, Supine    General Comments General comments (skin integrity, edema, etc.): pt with c/o nausea throughout mobility, requesting to get OOB however once seated up EOB pt staitng she has to lie back down      Pertinent Vitals/Pain Pain Assessment Pain Assessment: No/denies pain Faces Pain Scale: No hurt Pain Intervention(s): Monitored during session    Home Living                          Prior Function            PT Goals (current goals can now be found in the care plan section) Acute Rehab PT Goals Patient Stated Goal: none stated PT Goal Formulation: Patient unable to participate in goal setting Time For Goal Achievement: 01/14/23 Progress towards PT goals: Progressing toward goals    Frequency    Min 1X/week      PT Plan      Co-evaluation              AM-PAC PT "6 Clicks" Mobility   Outcome Measure  Help needed turning from your back to your side while in a flat bed without using bedrails?: A Lot Help needed moving from lying on your back to sitting on the side of a flat bed without using bedrails?: A Lot Help needed moving to and from a bed to a chair (including a wheelchair)?: Total Help needed standing up from a chair using your arms (e.g., wheelchair or bedside chair)?: A Lot Help needed to walk in hospital room?: Total Help needed climbing 3-5 steps with a railing? : Total 6 Click Score: 9    End of Session   Activity Tolerance: Other (comment);Patient limited by fatigue (limited by nausea) Patient left: in bed;with call bell/phone within reach;with bed alarm set;with nursing/sitter in room Nurse Communication: Mobility status;Other (comment) (RN present, aware of nausea) PT Visit  Diagnosis: Other abnormalities of gait and mobility (R26.89);Muscle weakness (generalized) (M62.81);Other symptoms and signs involving the nervous system (R29.898)     Time: 1610-9604 PT Time Calculation (min) (ACUTE ONLY): 18 min  Charges:    $Therapeutic Activity: 8-22 mins PT General Charges $$ ACUTE PT VISIT: 1 Visit                     Ceasar Decandia R. PTA Acute Rehabilitation Services Office: [phone]   Catalina Antigua 01/06/2023, 10:35 AM

## 2023-01-07 NOTE — Telephone Encounter (Signed)
Oral Oncology Patient Advocate Encounter  Called [facility] [phone] 4 times in an attempt to confirm what is needed for patient to start treatment while admitted.   I has unable to make contact. I left 2 voicemails with the [facility] including my direct line for them to return my call.  Jinger Neighbors, CPhT-Adv Oncology Pharmacy Patient Advocate [facility] Direct Number: [phone]  Fax: [phone]

## 2023-01-08 NOTE — Telephone Encounter (Signed)
Oral Oncology Patient Advocate Encounter  Made an additional call to [facility] and was unable to reach AON to discuss what is needed for [place] to send orders to the facility and get patient started on her Mekinist & Tafinlar. Left a vm at the AON's extension.  Jinger Neighbors, CPhT-Adv Oncology Pharmacy Patient Advocate [facility] Direct Number: [phone]  Fax: [phone]

## 2023-01-11 ENCOUNTER — Other Ambulatory Visit: Payer: Self-pay

## 2023-01-11 ENCOUNTER — Other Ambulatory Visit: Payer: Self-pay | Admitting: *Deleted

## 2023-01-11 ENCOUNTER — Other Ambulatory Visit (HOSPITAL_COMMUNITY): Payer: Self-pay

## 2023-01-11 ENCOUNTER — Other Ambulatory Visit: Payer: Self-pay | Admitting: Pharmacy Technician

## 2023-01-11 MED ORDER — TRAMETINIB DIMETHYL SULFOXIDE 2 MG PO TABS: 2.0000 mg | ORAL_TABLET | Freq: Every day | ORAL | 1 refills | Status: DC

## 2023-01-11 MED ORDER — TRAMETINIB DIMETHYL SULFOXIDE 2 MG PO TABS
2.0000 mg | ORAL_TABLET | Freq: Every day | ORAL | 1 refills | Status: DC
  Filled 2023-01-11: qty 30, 30d supply, fill #0
  Filled 2023-02-02: qty 30, 30d supply, fill #1

## 2023-01-11 MED ORDER — DABRAFENIB MESYLATE 75 MG PO CAPS
150.0000 mg | ORAL_CAPSULE | Freq: Two times a day (BID) | ORAL | 1 refills | Status: DC
  Filled 2023-01-11: qty 120, 30d supply, fill #0
  Filled 2023-02-02: qty 120, 30d supply, fill #1

## 2023-01-11 MED ORDER — DABRAFENIB MESYLATE 75 MG PO CAPS: 150.0000 mg | ORAL_CAPSULE | Freq: Two times a day (BID) | ORAL | 1 refills | Status: DC

## 2023-01-11 NOTE — Progress Notes (Signed)
Specialty Pharmacy Initial Fill Coordination Note  Lindsey Daniels is a 67 y.o. female contacted today regarding refills of specialty medication(s) Dabrafenib Mesylate; Trametinib Dimethyl Sulfoxide .  Patient requested Delivery  on 01/15/23  to verified address Attn: Baruch Goldmann [address] [place] [address]   Medication will be filled on 01/12/23.   Patient is aware of $0 copayment.

## 2023-01-11 NOTE — Progress Notes (Signed)
Specialty Pharmacy Initiation Note   Lindsey Daniels is a 67 y.o. female who will be followed by the specialty pharmacy service for RxSp Oncology    Review of administration, indication, effectiveness, safety, potential side effects, storage/disposable, and missed dose instructions occurred today for patient's specialty medication(s) Dabrafenib Mesylate; Trametinib Dimethyl Sulfoxide     Patient/Caregiver did not have any additional questions or concerns.   Patient's therapy is appropriate to: Initiate    Goals Addressed             This Visit's Progress    Slow Disease Progression       Patient is initiating therapy. Patient will maintain adherence         Sherry Ruffing, PharmD, BCPS, BCOP Hematology/Oncology Clinical Pharmacist [facility] and [facility] Oral Chemotherapy Navigation Clinics [phone] 01/11/2023 3:24 PM

## 2023-01-11 NOTE — Telephone Encounter (Signed)
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   Counseled patient's brother regarding on administration, dosing, side effects, monitoring, drug-food interactions, safe handling, storage, and disposal. Patient is currently in rehab facility and medication will be administered by staff at facility.   Patient will take Tafinlar 75mg  capsules, 2 capsules (150mg ) by mouth twice daily.   Patient will take Mekinist 2mg  tablets,1 tablet by mouth once daily.   These will be taken on an empty stomach, 1 hour before or 2 hours after a meal.   Patient will take Tafinlar and Mekinist in the morning before breakfast, and will take Tafinlar again in the evening before bed, 2 hours after dinner.  Mekinist will be stored in the refrigerator.  Tafinlar and Mekinist start date: ~01/16/23   Adverse effects include but are not limited to: drug fever, rash, nausea, vomiting, diarrhea, peripheral edema, decreased LVEF, hyperglycemia.   Reviewed with patient's caregiver importance of keeping a medication schedule and plan for any missed doses. No barriers to medication adherence identified.  Medication reconciliation performed and medication/allergy list updated.  All questions answered.  Mr. Bufford Buttner voiced understanding and appreciation.   Medication education handout placed in mail for patient's family. Patient's family knows to call the office with questions or concerns. Oral Chemotherapy Clinic phone number provided.    Sherry Ruffing, PharmD, BCPS, BCOP Hematology/Oncology Clinical Pharmacist [facility] and [facility] Oral Chemotherapy Navigation Clinics [phone] 01/11/2023 3:30 PM

## 2023-01-11 NOTE — Progress Notes (Signed)
Printed Rx for [employer] and faxed to [employer]

## 2023-01-11 NOTE — Telephone Encounter (Signed)
Oral Oncology Patient Advocate Encounter  Was able to call back to [facility] and speak with the AON, Morrie Sheldon. Medication will be shipped directly to the facility for patient to begin therapy.  Facility address:  Attn: Baruch Goldmann [address]  [place], [place] [address]  Orders will need to be faxed to the main facility fax, [phone]  Jinger Neighbors, CPhT-Adv Oncology Pharmacy Patient Advocate [facility] Direct Number: [phone]  Fax: [phone]

## 2023-01-11 NOTE — Progress Notes (Signed)
01/11/23: Lindsey Daniels regarding delivery date. Medications will now be filled on 01/12/23 with a delivery date of 01/13/23.

## 2023-01-11 NOTE — Telephone Encounter (Signed)
Oral Chemotherapy Pharmacist Encounter   Called and spoke with patient's friend, Bethann Berkshire, to update her regarding inability to reach [facility]. Per Arline Asp she states she has not been able to visit Corrie Dandy in the facility yet since she was discharged from the hospital, but to try and call patient's brother, Leward Quan regarding this.  Called and spoke to [facility] and updated him regarding attempts to reach facility to set up shipment of medication to facility. Wes provided additional phone number (tele: [phone]) that he has also utilized to get in touch with facility, so will try this number as well.   Attempt made to call tele: [phone], but no answer and no voicemail. Called back original number [phone] and spoke with receptionist Lanora Manis. Per elizabeth the AON is currently in a meeting. Informed Lanora Manis that our Mitchel Honour has been trying since 11/21 to reach AON and left numerous voicemail's, but have not received any callbacks. Provided my direct phone number to [facility] for the AON for call back.   Oral chemotherapy clinic will continue follow.  Lenord Carbo, PharmD, BCPS, [facility] Hematology/Oncology Clinical Pharmacist [facility] and [facility] Oral Chemotherapy Navigation Clinics [phone] 01/11/2023 9:58 AM

## 2023-01-12 ENCOUNTER — Other Ambulatory Visit (HOSPITAL_COMMUNITY): Payer: Self-pay

## 2023-01-12 ENCOUNTER — Other Ambulatory Visit: Payer: Self-pay

## 2023-01-18 ENCOUNTER — Other Ambulatory Visit (HOSPITAL_COMMUNITY): Payer: Self-pay

## 2023-01-18 ENCOUNTER — Other Ambulatory Visit: Payer: Self-pay

## 2023-01-18 ENCOUNTER — Telehealth: Payer: Self-pay

## 2023-01-18 NOTE — Telephone Encounter (Signed)
T/C from Baruch Goldmann at [facility] stating only one of two of the medications ordered on 11/25 by Dr Barbaraann Cao were received.  They received the TAFINLAR but not MEKINIST.  Per [facility] both medications were shipped on the same day and they are showing both were delivered.  He said the MEKINIST has to be refrigerated and that may be where it is.  LM for Morrie Sheldon [phone]) with the above information

## 2023-01-19 NOTE — Telephone Encounter (Signed)
Called and spoke with Morrie Sheldon in transportation and scheduled patient next appointment for 12/17 @845  labs and md visit at 930

## 2023-01-27 ENCOUNTER — Other Ambulatory Visit: Payer: Self-pay

## 2023-02-01 ENCOUNTER — Other Ambulatory Visit: Payer: Self-pay | Admitting: Internal Medicine

## 2023-02-01 ENCOUNTER — Other Ambulatory Visit: Payer: Self-pay | Admitting: *Deleted

## 2023-02-01 DIAGNOSIS — C719 Malignant neoplasm of brain, unspecified: Secondary | ICD-10-CM

## 2023-02-02 ENCOUNTER — Other Ambulatory Visit (HOSPITAL_COMMUNITY): Payer: Self-pay

## 2023-02-02 ENCOUNTER — Inpatient Hospital Stay: Payer: Medicare Other | Attending: Internal Medicine

## 2023-02-02 ENCOUNTER — Encounter: Payer: Self-pay | Admitting: Internal Medicine

## 2023-02-02 ENCOUNTER — Inpatient Hospital Stay: Payer: Medicare Other | Attending: Internal Medicine | Admitting: Internal Medicine

## 2023-02-02 ENCOUNTER — Other Ambulatory Visit: Payer: Self-pay

## 2023-02-02 ENCOUNTER — Inpatient Hospital Stay: Payer: Medicare Other

## 2023-02-02 VITALS — BP 131/100 | HR 111 | Temp 97.6°F | Resp 17 | Wt 123.2 lb

## 2023-02-02 DIAGNOSIS — Z5112 Encounter for antineoplastic immunotherapy: Secondary | ICD-10-CM | POA: Insufficient documentation

## 2023-02-02 DIAGNOSIS — C712 Malignant neoplasm of temporal lobe: Secondary | ICD-10-CM | POA: Diagnosis present

## 2023-02-02 DIAGNOSIS — I6789 Other cerebrovascular disease: Secondary | ICD-10-CM | POA: Diagnosis not present

## 2023-02-02 DIAGNOSIS — E876 Hypokalemia: Secondary | ICD-10-CM

## 2023-02-02 DIAGNOSIS — C719 Malignant neoplasm of brain, unspecified: Secondary | ICD-10-CM

## 2023-02-02 DIAGNOSIS — Z79899 Other long term (current) drug therapy: Secondary | ICD-10-CM | POA: Insufficient documentation

## 2023-02-02 DIAGNOSIS — Y842 Radiological procedure and radiotherapy as the cause of abnormal reaction of the patient, or of later complication, without mention of misadventure at the time of the procedure: Secondary | ICD-10-CM | POA: Diagnosis not present

## 2023-02-02 LAB — CBC WITH DIFFERENTIAL (CANCER CENTER ONLY)
Abs Immature Granulocytes: 0.06 10*3/uL (ref 0.00–0.07)
Basophils Absolute: 0.1 10*3/uL (ref 0.0–0.1)
Basophils Relative: 1 %
Eosinophils Absolute: 0.2 10*3/uL (ref 0.0–0.5)
Eosinophils Relative: 2 %
HCT: 49.8 % — ABNORMAL HIGH (ref 36.0–46.0)
Hemoglobin: 16.5 g/dL — ABNORMAL HIGH (ref 12.0–15.0)
Immature Granulocytes: 1 %
Lymphocytes Relative: 22 %
Lymphs Abs: 1.7 10*3/uL (ref 0.7–4.0)
MCH: 29.7 pg (ref 26.0–34.0)
MCHC: 33.1 g/dL (ref 30.0–36.0)
MCV: 89.6 fL (ref 80.0–100.0)
Monocytes Absolute: 0.8 10*3/uL (ref 0.1–1.0)
Monocytes Relative: 10 %
Neutro Abs: 5.1 10*3/uL (ref 1.7–7.7)
Neutrophils Relative %: 64 %
Platelet Count: 330 10*3/uL (ref 150–400)
RBC: 5.56 MIL/uL — ABNORMAL HIGH (ref 3.87–5.11)
RDW: 14.6 % (ref 11.5–15.5)
WBC Count: 8 10*3/uL (ref 4.0–10.5)
nRBC: 0 % (ref 0.0–0.2)

## 2023-02-02 LAB — CMP (CANCER CENTER ONLY)
ALT: 9 U/L (ref 0–44)
AST: 16 U/L (ref 15–41)
Albumin: 4.1 g/dL (ref 3.5–5.0)
Alkaline Phosphatase: 86 U/L (ref 38–126)
Anion gap: 8 (ref 5–15)
BUN: 9 mg/dL (ref 8–23)
CO2: 29 mmol/L (ref 22–32)
Calcium: 10.1 mg/dL (ref 8.9–10.3)
Chloride: 103 mmol/L (ref 98–111)
Creatinine: 0.65 mg/dL (ref 0.44–1.00)
GFR, Estimated: >60 mL/min (ref >60)
Glucose, Bld: 165 mg/dL — ABNORMAL HIGH (ref 70–99)
Potassium: 2.9 mmol/L — ABNORMAL LOW (ref 3.5–5.1)
Sodium: 140 mmol/L (ref 135–145)
Total Bilirubin: 1 mg/dL (ref <1.2)
Total Protein: 6.4 g/dL — ABNORMAL LOW (ref 6.5–8.1)

## 2023-02-02 MED ORDER — POTASSIUM CHLORIDE CRYS ER 20 MEQ PO TBCR
20.0000 meq | EXTENDED_RELEASE_TABLET | Freq: Two times a day (BID) | ORAL | 1 refills | Status: DC
Start: 2023-02-02 — End: 2023-02-02

## 2023-02-02 MED ORDER — POTASSIUM CHLORIDE CRYS ER 20 MEQ PO TBCR
20.0000 meq | EXTENDED_RELEASE_TABLET | Freq: Once | ORAL | Status: AC
  Administered 2023-02-02: 20 meq via ORAL
  Filled 2023-02-02: qty 1

## 2023-02-02 MED ORDER — POTASSIUM CHLORIDE CRYS ER 20 MEQ PO TBCR: 20.0000 meq | EXTENDED_RELEASE_TABLET | Freq: Two times a day (BID) | ORAL | 1 refills | Status: DC

## 2023-02-02 MED ORDER — POTASSIUM CHLORIDE CRYS ER 20 MEQ PO TBCR: 20.0000 meq | EXTENDED_RELEASE_TABLET | Freq: Once | ORAL | Status: DC

## 2023-02-02 NOTE — Progress Notes (Signed)
START OFF PATHWAY REGIMEN - Neuro   [id number]:Bevacizumab 10 mg/kg IV D1 q14 Days:   A cycle is every 14 days:     Bevacizumab-xxxx   **Always confirm dose/schedule in your pharmacy ordering system**  Patient Characteristics: Glioma, Grade 2 Astrocytoma, IDH-mutant/Oligodendroglioma, IDH-mutant and 1p/19q-codeleted, Recurrent or Progressive, Adjuvant Therapy, Systemic Therapy Candidate, BRAF V600E Mutation Negative/Unknown and NTRK Fusion Negative/Unknown Disease Classification: Glioma Disease Classification: Grade 2 Astrocytoma, IDH-mutant/Oligodendroglioma, IDH-mutant and 1p/19q-codeleted Disease Status: Recurrent or Progressive Treatment Classification: Adjuvant Therapy Treatment (Nonsurgical/Adjuvant): Systemic Therapy Candidate NTRK Gene Fusion Status: Did Not Order Test BRAF V600E Mutation Status: Did Not Order Test Intent of Therapy: Non-Curative / Palliative Intent, Discussed with Patient

## 2023-02-02 NOTE — Progress Notes (Signed)
[facility] at [facility] [address]  [place], [place] [address] [phone]   Interval Evaluation  Date of Service: 02/02/23 Patient Name: Lindsey Daniels Patient MRN: [id number] Patient DOB: [birth year] Provider: Henreitta Leber, MD  Identifying Statement:  Lindsey Daniels is a 67 y.o. female with right temporal  pleomorphic xanthoastrocytoma    Oncologic History: Oncology History  Pleomorphic xanthoastrocytoma of brain [facility])  09/29/2013 Surgery   Right temporal craniotomy, resection with Dr. Newell Coral.  Path is PXA WHO II.   11/29/2020 Surgery   Disease progression, second craniotomy with Dr. Franky Macho, sub-total resection.  Path is unchanged.   02/21/2021 - 04/02/2021 Radiation Therapy   IMRT radiation therapy with Dr. Mitzi Hansen   12/16/2021 Treatment Plan Change   Completes 4 cycles of avastin 10mg /kg for severe inflammation, poor tolerance of steroids   12/02/2022 Progression   Progression of Disease   01/11/2023 -  Chemotherapy   Initiates dual BRAF/MEK inhibition with dabrafenib+trametinib     Biomarkers:   Interval History: Lindsey Daniels presents today for evaluation following recent MRI brain. She continues to reside in the skilled nursing facility.  Sleeps or stays in bed much of the day, watches or listens to TV.  Remains non ambulatory due to significant left sided weakness.  Overall she is not happy with her quality of life. No frank seizures that are confirmed by nursing aide at bedside today.  She has started on combined BRAF/MEK inhibitory therapy, start date 11/25 per nursing charts.  Decadron is still at 4mg  daily.  Prior: She describes twitching of the left side which persisted for more than 2 hours.  During admission Keppra was increased  to 1500mg  twice per day, decadron restarted 4mg  twice per day.  She remains quite weak on the left side compared to prior, particularly with the arm.  She is walking minimally with the walker,  mostly using the wheelchair.  Very high levels of irritability, mood swings noted at the facility.  She is confused at times, but falls have been avoided.  H+P (01/30/21) Patient presents today after recent re-resection for progressive right temporal PXA.  She initially presented with a seizure in 2015; imaging demonstrated enhancing right temporal lesion which was grossly resected by Dr. Newell Coral.  After last follow up in 2020, she began to experience headaches, ear fullness and some confusion this fall.  Imaging demonstrated marked progression of disease; she went for second surgery on 11/29/20.  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   Medications: Current Outpatient Medications on File Prior to Visit  Medication Sig Dispense Refill   acetaminophen (TYLENOL) 650 MG CR tablet Take 650 mg by mouth every 8 (eight) hours as needed for pain.     apixaban (ELIQUIS) 5 MG TABS tablet Take 1 tablet (5 mg total) by mouth 2 (two) times daily. 60 tablet 2   atorvastatin (LIPITOR) 20 MG tablet Take 1 tablet (20 mg total) by mouth daily. 90 tablet 3   cetirizine (ZYRTEC) 10 MG tablet Take 10 mg by mouth daily.     dabrafenib mesylate (TAFINLAR) 75 MG capsule Take 2 capsules (150 mg total) by mouth 2 (two) times daily. Take on an empty stomach 1 hour before or 2 hours after meals. 120 capsule 1   dexamethasone (DECADRON) 4 MG tablet Take 1 tablet (4 mg total) by mouth daily.     fluticasone (FLONASE)  50 MCG/ACT nasal spray Place 1-2 sprays into both nostrils daily. (Patient taking differently: Place 2 sprays into both nostrils daily.) 16 g 0   lamoTRIgine (LAMICTAL) 150 MG tablet Take 1 tablet (150 mg total) by mouth 2 (two) times daily.     levETIRAcetam (KEPPRA) 750 MG tablet Take 2 tablets (1,500 mg total) by mouth 2 (two) times daily. 120 tablet 1   metoprolol tartrate (LOPRESSOR) 25 MG tablet Take 12.5 mg by mouth 2 (two)  times daily.     Midazolam (NAYZILAM) 5 MG/0.1ML SOLN Place 5 mg into the nose every 6 (six) hours as needed (prolonged seizure, greater than 5 minutes). 1 each 0   Multiple Vitamin (MULTIVITAMIN WITH MINERALS) TABS tablet Take 1 tablet by mouth daily.     omeprazole (PRILOSEC) 20 MG capsule Take 20 mg by mouth daily.     ondansetron (ZOFRAN) 4 MG tablet Take 4 mg by mouth every 6 (six) hours as needed for nausea or vomiting.     sertraline (ZOLOFT) 100 MG tablet Take 1 tablet (100 mg total) by mouth daily. 90 tablet 3   sertraline (ZOLOFT) 50 MG tablet Take 50 mg by mouth daily.     trametinib dimethyl sulfoxide (MEKINIST) 2 MG tablet Take 1 tablet (2 mg total) by mouth daily. Take 1 hour before or 2 hours after a meal. Store refrigerated in original container. 30 tablet 1   No current facility-administered medications on file prior to visit.    Allergies:  Allergies  Allergen Reactions   Rifapentine Other (See Comments)    Flu-like symptoms   Amoxicillin Rash   Clavulanic Acid Rash   Past Medical History:  Past Medical History:  Diagnosis Date   Allergy    Anal fissure    Anxiety    Cataract    Depression    Elevated LFTs    Hyperglycemia    Hyperlipidemia    Hypertension    Osteoarthritis    Pars defect of lumbar spine    Pleomorphic xanthoastrocytoma of temporal lobe (HCC)    Right   Seizures (HCC)    Spondylolisthesis    lumbar   Thrombocytopenia (HCC) 12/31/2022   Past Surgical History:  Past Surgical History:  Procedure Laterality Date   cataract     CRANIOTOMY N/A 10/02/2013   Procedure: 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;  Surgeon: Hewitt Shorts, MD;  Location: [facility];  Service: Neurosurgery;  Laterality: N/A;  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   CRANIOTOMY Right 11/29/2020   Procedure: Right Parietal craniotomy for tumor resection;  Surgeon: Coletta Memos, MD;  Location: [facility] OR;  Service: Neurosurgery;  Laterality: Right;    HEEL SPUR SURGERY     Social History:  Social History   Socioeconomic History   Marital status: Divorced    Spouse name: separated-no papers file   Number of children: 0   Years of education: Not on file   Highest education level: Not on file  Occupational History   Occupation: RT    Employer: [employer] @     Comment: [employer] and [employer]  Tobacco Use   Smoking status: Never   Smokeless tobacco: Never  Substance and Sexual Activity   Alcohol use: Yes    Alcohol/week: 0.0 - 4.0 standard drinks of alcohol    Comment: social   Drug use: No   Sexual activity: Not Currently    Partners: Male  Other Topics Concern  Not on file  Social History Narrative   Separated from second husband.  Very contentious split.   Social Drivers of Corporate investment banker Strain: Low Risk  (01/15/2022)   Overall Financial Resource Strain (CARDIA)    Difficulty of Paying Living Expenses: Not hard at all  Food Insecurity: Patient Unable To Answer (12/31/2022)   Hunger Vital Sign    Worried About Programme researcher, broadcasting/film/video in the Last Year: Patient unable to answer    Ran Out of Food in the Last Year: Patient unable to answer  Transportation Needs: Patient Unable To Answer (12/31/2022)   PRAPARE - Transportation    Lack of Transportation (Medical): Patient unable to answer    Lack of Transportation (Non-Medical): Patient unable to answer  Physical Activity: Inactive (02/03/2021)   Received from [facility], [facility]   Exercise Vital Sign    Days of Exercise per Week: 0 days    Minutes of Exercise per Session: 0 min  Stress: Stress Concern Present (02/03/2021)   Received from [facility], [facility] - Occupational Stress Questionnaire    Feeling of Stress : To some extent  Social Connections: Unknown (06/16/2021)   Received from [facility], [facility]   Social Network    Social Network: Not  on file  Intimate Partner Violence: Patient Unable To Answer (12/31/2022)   Humiliation, Afraid, Rape, and Kick questionnaire    Fear of Current or Ex-Partner: Patient unable to answer    Emotionally Abused: Patient unable to answer    Physically Abused: Patient unable to answer    Sexually Abused: Patient unable to answer   Family History:  Family History  Problem Relation Age of Onset   Mental illness Father        depression   COPD Father    Alcohol abuse Brother    Breast cancer Neg Hx     Review of Systems: Constitutional: Doesn't report fevers, chills or abnormal weight loss Eyes: Doesn't report blurriness of vision Ears, nose, mouth, throat, and face: Doesn't report sore throat Respiratory: +dyspnea Cardiovascular: Doesn't report palpitation, chest discomfort  Gastrointestinal:  Doesn't report nausea, constipation, diarrhea GU: Doesn't report incontinence Skin: Doesn't report skin rashes Neurological: Per HPI Musculoskeletal: Doesn't report joint pain Behavioral/Psych: Doesn't report anxiety  Physical Exam: Vitals:   02/02/23 0852  BP: (!) 131/100  Pulse: (!) 111  Resp: 17  Temp: 97.6 F (36.4 C)  SpO2: 100%   KPS: 60. General: laying down, eyes closed Head: Normal EENT: No conjunctival injection or scleral icterus.  Lungs: Normal Cardiac: Regular rate Abdomen: Non-distended abdomen Skin: +purpura, arms and legs Extremities: +edema  Neurologic Exam: Mental Status: Drowsy, alert, attentive to examiner with tactile stimulation. Oriented to self and environment when attentive. Language is fluent with intact comprehension.  Displays some disorganized thinking. Age advanced psychomotor slowing. Cranial Nerves: Visual acuity is grossly normal. Visual fields are full. Extra-ocular movements intact. No ptosis. Face is symmetric Motor: Tone and bulk are normal. Power is 2/5 in left arm, 4/5 in left leg. Reflexes are symmetric, no pathologic reflexes present.   Sensory: Intact to light touch Gait: Non ambulatory   Labs: I have reviewed the data as listed    Component Value Date/Time   NA 140 02/02/2023 0828   NA 142 06/30/2017 1815   K 2.9 (L) 02/02/2023 0828   CL 103 02/02/2023 0828   CO2 29 02/02/2023 0828   GLUCOSE 165 (H) 02/02/2023  0828   BUN 9 02/02/2023 0828   BUN 14 06/30/2017 1815   CREATININE 0.65 02/02/2023 0828   CREATININE 0.72 06/07/2015 0825   CALCIUM 10.1 02/02/2023 0828   PROT 6.4 (L) 02/02/2023 0828   PROT 7.0 06/30/2017 1815   ALBUMIN 4.1 02/02/2023 0828   ALBUMIN 4.6 06/30/2017 1815   AST 16 02/02/2023 0828   ALT 9 02/02/2023 0828   ALKPHOS 86 02/02/2023 0828   BILITOT 1.0 02/02/2023 0828   GFRNONAA >60 02/02/2023 0828   GFRAA  01/18/2022 0405    UNSATISFACTORY SPECIMEN. CLIENT REQUESTS SPECIMEN TO BE ANALYZED.   Lab Results  Component Value Date   WBC 8.0 02/02/2023   NEUTROABS 5.1 02/02/2023   HGB 16.5 (H) 02/02/2023   HCT 49.8 (H) 02/02/2023   MCV 89.6 02/02/2023   PLT 330 02/02/2023     Assessment/Plan Hypokalemia - Plan: potassium chloride SA (KLOR-CON M) CR tablet 20 mEq  Radiation therapy induced brain necrosis - Plan: Infusion Appointment Request, Clinic Appointment Request, Lab Appointment Request, CBC with Differential (Cancer Center Only), Total Protein, Urine dipstick, MR BRAIN W WO CONTRAST  Pleomorphic xanthoastrocytoma of brain (HCC) - Plan: Infusion Appointment Request, Clinic Appointment Request, Lab Appointment Request, CBC with Differential (Cancer Center Only), Total Protein, Urine dipstick, MR BRAIN W WO CONTRAST  Lindsey Daniels remains with disabling encephalopathy from combination of tumor progression (from November 14 MRI), refractory inflammation/radiation necrosis (despite 4mg  decadron), 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  Dabrafenib (11/25) and Trametinib (12/2) were only recently initiated following approval process.  Recommended the following: -Continue  BRAF/MEK inhibition with Dabrafenib and Trametinib -Re-introduce avastin 10mg /kg IV q2 weeks x4 cycles given ongoing burden of inflammation, suspected refractory RN.  She previously tolerated and responded well to avastin (2023) -Repeat brain MRI prior to avastin to establish new baseline, then re-image after 4 cycles -Decadron to stay at 4mg  daily until avastin initiated -Con't physical therapy at SNF -Con't Keppra 1500mg  BID, Lamictal 150mg  BID -Replete K+ orally today, start oral daily regimen   We ask that Lindsey Daniels return to clinic for cycle #1 avastin, or sooner if needed.  All questions were answered. The patient knows to call the clinic with any problems, questions or concerns. No barriers to learning were detected.  The total time spent in the encounter was 40 minutes and more than 50% was on counseling and review of test results   Henreitta Leber, MD Medical Director of Neuro-Oncology [facility] 02/02/23 9:20 AM

## 2023-02-02 NOTE — Progress Notes (Signed)
Specialty Pharmacy Ongoing Clinical Assessment Note  Lindsey Daniels is a 67 y.o. female who is being followed by the specialty pharmacy service for RxSp Oncology   Patient's specialty medication(s) reviewed today: Dabrafenib Mesylate (TAFINLAR)   Missed doses in the last 4 weeks: 0   Patient/Caregiver did not have any additional questions or concerns.   Therapeutic benefit summary: Unable to assess   Adverse events/side effects summary: No adverse events/side effects   Patient's therapy is appropriate to: Continue    Goals Addressed             This Visit's Progress    Stabilization of disease       Patient is on track. Patient will maintain adherence         Follow up:  3 months  Bobette Mo Specialty Pharmacist

## 2023-02-02 NOTE — Progress Notes (Signed)
Opened encounter to setup refill with patient's brother but patient insurance has changed and now has a high copay. Forwarded patient information to C.Medlin and will update once we have more information.

## 2023-02-03 ENCOUNTER — Telehealth: Payer: Self-pay | Admitting: Pharmacy Technician

## 2023-02-03 ENCOUNTER — Encounter: Payer: Self-pay | Admitting: Internal Medicine

## 2023-02-03 ENCOUNTER — Other Ambulatory Visit (HOSPITAL_COMMUNITY): Payer: Self-pay

## 2023-02-03 NOTE — Telephone Encounter (Signed)
Oral Oncology Patient Advocate Encounter   Began application for assistance for Mekinist/Tafinlar through [employer].   Application will be submitted upon completion of necessary supporting documentation.   [employer] phone number [phone].   I will continue to check the status until final determination.   Jinger Neighbors, CPhT-Adv Oncology Pharmacy Patient Advocate [facility] Direct Number: [phone]  Fax: [phone]

## 2023-02-04 ENCOUNTER — Telehealth: Payer: Self-pay | Admitting: *Deleted

## 2023-02-04 ENCOUNTER — Other Ambulatory Visit: Payer: Self-pay

## 2023-02-04 NOTE — Telephone Encounter (Signed)
Oral Oncology Patient Advocate Encounter  Patient signatures and POI collected. Application is now pending MD signature.  Jinger Neighbors, CPhT-Adv Oncology Pharmacy Patient Advocate [facility] Direct Number: [phone]  Fax: [phone]

## 2023-02-04 NOTE — Telephone Encounter (Signed)
PC to [place] at [facility], no answer, left VM - informed her patient is scheduled for brain MRI on 02/11/23 at 1:00 at [facility].  She also has appointments at [facility] on 02/15/23 - lab at 9:30, Dr Barbaraann Cao at 10:00, and infusion at 11:30.  Instructed Eber Jones to [employer] if MRI appointment needs to be changed, [phone].  Also informed her she may contact this office with any questions/concerns, [phone].

## 2023-02-05 NOTE — Progress Notes (Signed)
Pharmacist Chemotherapy Monitoring - Initial Assessment    Anticipated start date: 02/15/2023   The following has been reviewed per standard work regarding the patient's treatment regimen: The patient's diagnosis, treatment plan and drug doses, and organ/hematologic function Lab orders and baseline tests specific to treatment regimen  The treatment plan start date, drug sequencing, and pre-medications Prior authorization status  Patient's documented medication list, including drug-drug interaction screen and prescriptions for anti-emetics and supportive care specific to the treatment regimen The drug concentrations, fluid compatibility, administration routes, and timing of the medications to be used The patient's access for treatment and lifetime cumulative dose history, if applicable  The patient's medication allergies and previous infusion related reactions, if applicable   Changes made to treatment plan:  N/A  Follow up needed:  N/A   Jerry Caras, PharmD PGY2 Oncology Pharmacy Resident   02/05/2023 2:50 PM

## 2023-02-08 ENCOUNTER — Other Ambulatory Visit (HOSPITAL_COMMUNITY): Payer: Self-pay

## 2023-02-08 ENCOUNTER — Encounter: Payer: Self-pay | Admitting: Internal Medicine

## 2023-02-08 NOTE — Telephone Encounter (Signed)
Oral Oncology Patient Advocate Encounter   Received notification that the application for assistance for Mekinist/Tafinlar through [employer] has been approved.  - [employer] phone number [phone].   Effective dates: 02/08/23 through 02/16/24  Medication will be filled at [facility].  Jinger Neighbors, CPhT-Adv Oncology Pharmacy Patient Advocate [facility] Direct Number: [phone]  Fax: [phone]

## 2023-02-08 NOTE — Telephone Encounter (Signed)
Oral Oncology Patient Advocate Encounter   Submitted application for assistance for Mekinist/Tafinlar to [employer].   Application submitted via e-fax to [phone]   [employer]PA[employer] phone number [phone].   I will continue to check the status until final determination.   Jinger Neighbors, CPhT-Adv Oncology Pharmacy Patient Advocate [facility] Direct Number: [phone]  Fax: [phone]

## 2023-02-11 ENCOUNTER — Other Ambulatory Visit (HOSPITAL_COMMUNITY): Payer: Medicare Other

## 2023-02-11 ENCOUNTER — Ambulatory Visit (HOSPITAL_COMMUNITY)
Admission: RE | Admit: 2023-02-11 | Discharge: 2023-02-11 | Disposition: A | Discharge status: Home / Self Care | Payer: Medicare Other | Source: Ambulatory Visit | Attending: Internal Medicine | Admitting: Internal Medicine

## 2023-02-11 DIAGNOSIS — R229 Localized swelling, mass and lump, unspecified: Secondary | ICD-10-CM | POA: Insufficient documentation

## 2023-02-11 DIAGNOSIS — C719 Malignant neoplasm of brain, unspecified: Secondary | ICD-10-CM | POA: Diagnosis present

## 2023-02-11 DIAGNOSIS — R451 Restlessness and agitation: Secondary | ICD-10-CM | POA: Insufficient documentation

## 2023-02-11 DIAGNOSIS — I6789 Other cerebrovascular disease: Secondary | ICD-10-CM | POA: Diagnosis present

## 2023-02-11 DIAGNOSIS — Y842 Radiological procedure and radiotherapy as the cause of abnormal reaction of the patient, or of later complication, without mention of misadventure at the time of the procedure: Secondary | ICD-10-CM | POA: Insufficient documentation

## 2023-02-11 MED ORDER — GADOBUTROL 1 MMOL/ML IV SOLN
5.5000 mL | Freq: Once | INTRAVENOUS | Status: AC | PRN
  Administered 2023-02-11: 5.5 mL via INTRAVENOUS

## 2023-02-12 ENCOUNTER — Other Ambulatory Visit: Payer: Self-pay

## 2023-02-12 NOTE — Progress Notes (Signed)
Medication will be filled at [facility].

## 2023-02-15 ENCOUNTER — Inpatient Hospital Stay (HOSPITAL_BASED_OUTPATIENT_CLINIC_OR_DEPARTMENT_OTHER): Payer: Medicare Other | Admitting: Internal Medicine

## 2023-02-15 ENCOUNTER — Inpatient Hospital Stay: Payer: Medicare Other

## 2023-02-15 ENCOUNTER — Encounter: Payer: Self-pay | Admitting: Internal Medicine

## 2023-02-15 VITALS — BP 141/101 | Temp 97.9°F | Resp 18 | Ht 63.0 in | Wt 123.5 lb

## 2023-02-15 VITALS — BP 150/95 | HR 105 | Resp 18

## 2023-02-15 DIAGNOSIS — Y842 Radiological procedure and radiotherapy as the cause of abnormal reaction of the patient, or of later complication, without mention of misadventure at the time of the procedure: Secondary | ICD-10-CM | POA: Diagnosis not present

## 2023-02-15 DIAGNOSIS — I6789 Other cerebrovascular disease: Secondary | ICD-10-CM

## 2023-02-15 DIAGNOSIS — C719 Malignant neoplasm of brain, unspecified: Secondary | ICD-10-CM

## 2023-02-15 DIAGNOSIS — Z5112 Encounter for antineoplastic immunotherapy: Secondary | ICD-10-CM | POA: Diagnosis not present

## 2023-02-15 LAB — CMP (CANCER CENTER ONLY)
ALT: 16 U/L (ref 0–44)
AST: 25 U/L (ref 15–41)
Albumin: 4.2 g/dL (ref 3.5–5.0)
Alkaline Phosphatase: 95 U/L (ref 38–126)
Anion gap: 10 (ref 5–15)
BUN: 14 mg/dL (ref 8–23)
CO2: 23 mmol/L (ref 22–32)
Calcium: 9.9 mg/dL (ref 8.9–10.3)
Chloride: 104 mmol/L (ref 98–111)
Creatinine: 0.62 mg/dL (ref 0.44–1.00)
GFR, Estimated: >60 mL/min (ref >60)
Glucose, Bld: 202 mg/dL — ABNORMAL HIGH (ref 70–99)
Potassium: 3.5 mmol/L (ref 3.5–5.1)
Sodium: 137 mmol/L (ref 135–145)
Total Bilirubin: 0.9 mg/dL (ref 0.0–1.2)
Total Protein: 6.9 g/dL (ref 6.5–8.1)

## 2023-02-15 LAB — CBC WITH DIFFERENTIAL (CANCER CENTER ONLY)
Abs Immature Granulocytes: 0.07 10*3/uL (ref 0.00–0.07)
Basophils Absolute: 0.1 10*3/uL (ref 0.0–0.1)
Basophils Relative: 1 %
Eosinophils Absolute: 0.1 10*3/uL (ref 0.0–0.5)
Eosinophils Relative: 1 %
HCT: 49.1 % — ABNORMAL HIGH (ref 36.0–46.0)
Hemoglobin: 17.4 g/dL — ABNORMAL HIGH (ref 12.0–15.0)
Immature Granulocytes: 1 %
Lymphocytes Relative: 24 %
Lymphs Abs: 3.6 10*3/uL (ref 0.7–4.0)
MCH: 31 pg (ref 26.0–34.0)
MCHC: 35.4 g/dL (ref 30.0–36.0)
MCV: 87.4 fL (ref 80.0–100.0)
Monocytes Absolute: 1 10*3/uL (ref 0.1–1.0)
Monocytes Relative: 7 %
Neutro Abs: 10 10*3/uL — ABNORMAL HIGH (ref 1.7–7.7)
Neutrophils Relative %: 66 %
Platelet Count: 335 10*3/uL (ref 150–400)
RBC: 5.62 MIL/uL — ABNORMAL HIGH (ref 3.87–5.11)
RDW: 14.1 % (ref 11.5–15.5)
WBC Count: 14.9 10*3/uL — ABNORMAL HIGH (ref 4.0–10.5)
nRBC: 0 % (ref 0.0–0.2)

## 2023-02-15 LAB — TOTAL PROTEIN, URINE DIPSTICK: Protein, ur: NEGATIVE mg/dL

## 2023-02-15 MED ORDER — SODIUM CHLORIDE 0.9 % IV SOLN: INTRAVENOUS | Status: DC

## 2023-02-15 MED ORDER — DEXAMETHASONE 4 MG PO TABS: 2.0000 mg | ORAL_TABLET | Freq: Every day | ORAL | Status: DC

## 2023-02-15 MED ORDER — SODIUM CHLORIDE 0.9 % IV SOLN
10.0000 mg/kg | Freq: Once | INTRAVENOUS | Status: AC
  Administered 2023-02-15: 600 mg via INTRAVENOUS
  Filled 2023-02-15: qty 8

## 2023-02-15 NOTE — Progress Notes (Signed)
Per Dr Barbaraann Cao, ok to treat with diastolic BP 101-102 & HR of 116 today.

## 2023-02-15 NOTE — Progress Notes (Signed)
[facility] [address]  [place], [place] [address] [phone]   Interval Evaluation  Date of Service: 02/15/23 Patient Name: Lindsey Daniels Patient MRN: [id number] Patient DOB: [birth year] Provider: Henreitta Leber, MD  Identifying Statement:  Lindsey Daniels is a 67 y.o. female with right temporal  pleomorphic xanthoastrocytoma    Oncologic History: Oncology History  Pleomorphic xanthoastrocytoma of brain [facility])  09/29/2013 Surgery   Right temporal craniotomy, resection with Dr. Newell Coral.  Path is PXA WHO II.   11/29/2020 Surgery   Disease progression, second craniotomy with Dr. Franky Macho, sub-total resection.  Path is unchanged.   02/21/2021 - 04/02/2021 Radiation Therapy   IMRT radiation therapy with Dr. Mitzi Hansen   12/16/2021 Treatment Plan Change   Completes 4 cycles of avastin 10mg /kg for severe inflammation, poor tolerance of steroids   12/02/2022 Progression   Progression of Disease   01/11/2023 -  Chemotherapy   Initiates dual BRAF/MEK inhibition with dabrafenib+trametinib   02/15/2023 -  Chemotherapy   Patient is on Treatment Plan : BRAIN GBM Bevacizumab 14d x 6 cycles       Biomarkers:   Interval History: Lindsey Daniels presents today for initiation of avastin infusions.  She continues to reside in the skilled nursing facility.  Sleeps or stays in bed much of the day, watches or listens to TV.  Remains non ambulatory due to significant left sided weakness.  More recently she has started macrobid for a UTI. No frank seizures that are confirmed by nursing aide at bedside today.  She has started on combined BRAF/MEK inhibitory therapy, start date 11/25 per nursing charts.  Decadron is still at 4mg  daily.  Prior: She describes twitching of the left side which persisted for more than 2 hours.  During admission Keppra was increased  to 1500mg  twice per day, decadron restarted 4mg  twice per day.  She remains quite weak on the left  side compared to prior, particularly with the arm.  She is walking minimally with the walker, mostly using the wheelchair.  Very high levels of irritability, mood swings noted at the facility.  She is confused at times, but falls have been avoided.  H+P (01/30/21) Patient presents today after recent re-resection for progressive right temporal PXA.  She initially presented with a seizure in 2015; imaging demonstrated enhancing right temporal lesion which was grossly resected by Dr. Newell Coral.  After last follow up in 2020, she began to experience headaches, ear fullness and some confusion this fall.  Imaging demonstrated marked progression of disease; she went for second surgery on 11/29/20.  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   Medications: Current Outpatient Medications on File Prior to Visit  Medication Sig Dispense Refill   acetaminophen (TYLENOL) 650 MG CR tablet Take 650 mg by mouth every 8 (eight) hours as needed for pain.     apixaban (ELIQUIS) 5 MG TABS tablet Take 1 tablet (5 mg total) by mouth 2 (two) times daily. 60 tablet 2   atorvastatin (LIPITOR) 20 MG tablet Take 1 tablet (20 mg total) by mouth daily. 90 tablet 3   cetirizine (ZYRTEC) 10 MG tablet Take 10 mg by mouth daily.     dabrafenib mesylate (TAFINLAR) 75 MG capsule Take 2 capsules (150 mg total) by mouth 2 (two) times daily. Take on an empty stomach 1 hour before or 2 hours after meals. 120 capsule 1  dexamethasone (DECADRON) 4 MG tablet Take 1 tablet (4 mg total) by mouth daily.     fluticasone (FLONASE) 50 MCG/ACT nasal spray Place 1-2 sprays into both nostrils daily. (Patient taking differently: Place 2 sprays into both nostrils daily.) 16 g 0   lamoTRIgine (LAMICTAL) 150 MG tablet Take 1 tablet (150 mg total) by mouth 2 (two) times daily.     levETIRAcetam (KEPPRA) 750 MG tablet Take 2 tablets (1,500 mg total) by mouth 2 (two) times daily.  120 tablet 1   metoprolol tartrate (LOPRESSOR) 25 MG tablet Take 12.5 mg by mouth 2 (two) times daily.     Midazolam (NAYZILAM) 5 MG/0.1ML SOLN Place 5 mg into the nose every 6 (six) hours as needed (prolonged seizure, greater than 5 minutes). 1 each 0   Multiple Vitamin (MULTIVITAMIN WITH MINERALS) TABS tablet Take 1 tablet by mouth daily.     omeprazole (PRILOSEC) 20 MG capsule Take 20 mg by mouth daily.     ondansetron (ZOFRAN) 4 MG tablet Take 4 mg by mouth every 6 (six) hours as needed for nausea or vomiting.     potassium chloride SA (KLOR-CON M) 20 MEQ tablet Take 1 tablet (20 mEq total) by mouth 2 (two) times daily. 30 tablet 1   sertraline (ZOLOFT) 100 MG tablet Take 1 tablet (100 mg total) by mouth daily. 90 tablet 3   sertraline (ZOLOFT) 50 MG tablet Take 50 mg by mouth daily.     trametinib dimethyl sulfoxide (MEKINIST) 2 MG tablet Take 1 tablet (2 mg total) by mouth daily. Take 1 hour before or 2 hours after a meal. Store refrigerated in original container. 30 tablet 1   No current facility-administered medications on file prior to visit.    Allergies:  Allergies  Allergen Reactions   Rifapentine Other (See Comments)    Flu-like symptoms   Amoxicillin Rash   Clavulanic Acid Rash   Past Medical History:  Past Medical History:  Diagnosis Date   Allergy    Anal fissure    Anxiety    Cataract    Depression    Elevated LFTs    Hyperglycemia    Hyperlipidemia    Hypertension    Osteoarthritis    Pars defect of lumbar spine    Pleomorphic xanthoastrocytoma of temporal lobe (HCC)    Right   Seizures (HCC)    Spondylolisthesis    lumbar   Thrombocytopenia (HCC) 12/31/2022   Past Surgical History:  Past Surgical History:  Procedure Laterality Date   cataract     CRANIOTOMY N/A 10/02/2013   Procedure: 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;  Surgeon: Hewitt Shorts, MD;  Location: [facility];  Service: Neurosurgery;  Laterality: N/A;  Craniotomy for  resection of tumor with stealth   CRANIOTOMY Right 11/29/2020   Procedure: Right Parietal craniotomy for tumor resection;  Surgeon: Coletta Memos, MD;  Location: [facility];  Service: Neurosurgery;  Laterality: Right;   HEEL SPUR SURGERY     Social History:  Social History   Socioeconomic History   Marital status: Divorced    Spouse name: separated-no papers file   Number of children: 0   Years of education: Not on file   Highest education level: Not on file  Occupational History   Occupation: RT    Employer: [employer]    Comment: [employer] and [employer]  Tobacco Use   Smoking status: Never   Smokeless tobacco: Never  Substance and  Sexual Activity   Alcohol use: Yes    Alcohol/week: 0.0 - 4.0 standard drinks of alcohol    Comment: social   Drug use: No   Sexual activity: Not Currently    Partners: Male  Other Topics Concern   Not on file  Social History Narrative   Separated from second husband.  Very contentious split.   Social Drivers of Corporate investment banker Strain: Low Risk  (01/15/2022)   Overall Financial Resource Strain (CARDIA)    Difficulty of Paying Living Expenses: Not hard at all  Food Insecurity: Patient Unable To Answer (12/31/2022)   Hunger Vital Sign    Worried About Programme researcher, broadcasting/film/video in the Last Year: Patient unable to answer    Ran Out of Food in the Last Year: Patient unable to answer  Transportation Needs: Patient Unable To Answer (12/31/2022)   PRAPARE - Transportation    Lack of Transportation (Medical): Patient unable to answer    Lack of Transportation (Non-Medical): Patient unable to answer  Physical Activity: Inactive (02/03/2021)   [facility]   Exercise Vital Sign    Days of Exercise per Week: 0 days    Minutes of Exercise per Session: 0 min  Stress: Stress Concern Present (02/03/2021)   [facility] from [facility] - Occupational Stress Questionnaire    Feeling of Stress : To some extent  Social Connections: Unknown (06/16/2021)   Received from [facility]   Social Network    Social Network: Not on file  Intimate Partner Violence: Patient Unable To Answer (12/31/2022)   Humiliation, Afraid, Rape, and Kick questionnaire    Fear of Current or Ex-Partner: Patient unable to answer    Emotionally Abused: Patient unable to answer    Physically Abused: Patient unable to answer    Sexually Abused: Patient unable to answer   Family History:  Family History  Problem Relation Age of Onset   Mental illness Father        depression   COPD Father    Alcohol abuse Brother    Breast cancer Neg Hx     Review of Systems: Constitutional: Doesn't report fevers, chills or abnormal weight loss Eyes: Doesn't report blurriness of vision Ears, nose, mouth, throat, and face: Doesn't report sore throat Respiratory: +dyspnea Cardiovascular: Doesn't report palpitation, chest discomfort  Gastrointestinal:  Doesn't report nausea, constipation, diarrhea GU: Doesn't report incontinence Skin: Doesn't report skin rashes Neurological: Per HPI Musculoskeletal: Doesn't report joint pain Behavioral/Psych: Doesn't report anxiety  Physical Exam: Vitals:   02/15/23 1030 02/15/23 1032  BP: (!) 137/101 (!) 141/101  Resp: 18   Temp: 97.9 F (36.6 C)   SpO2: 98%     KPS: 60. General: laying down, eyes closed Head: Normal EENT: No conjunctival injection or scleral icterus.  Lungs: Normal Cardiac: Regular rate Abdomen: Non-distended abdomen Skin: +purpura, arms and legs Extremities: +edema  Neurologic Exam: Mental Status: Drowsy, alert, attentive to examiner with tactile stimulation. Oriented to self and environment when attentive. Language is fluent with intact comprehension.  Displays some disorganized thinking. Age advanced psychomotor slowing. Cranial Nerves: Visual acuity is  grossly normal. Visual fields are full. Extra-ocular movements intact. No ptosis. Face is symmetric Motor: Tone and bulk are normal. Power is 2/5 in left arm, 4/5 in left leg. Reflexes are symmetric, no pathologic reflexes present.  Sensory: Intact to light touch Gait: Non ambulatory   Labs: I  have reviewed the data as listed    Component Value Date/Time   NA 140 02/02/2023 0828   NA 142 06/30/2017 1815   K 2.9 (L) 02/02/2023 0828   CL 103 02/02/2023 0828   CO2 29 02/02/2023 0828   GLUCOSE 165 (H) 02/02/2023 0828   BUN 9 02/02/2023 0828   BUN 14 06/30/2017 1815   CREATININE 0.65 02/02/2023 0828   CREATININE 0.72 06/07/2015 0825   CALCIUM 10.1 02/02/2023 0828   PROT 6.4 (L) 02/02/2023 0828   PROT 7.0 06/30/2017 1815   ALBUMIN 4.1 02/02/2023 0828   ALBUMIN 4.6 06/30/2017 1815   AST 16 02/02/2023 0828   ALT 9 02/02/2023 0828   ALKPHOS 86 02/02/2023 0828   BILITOT 1.0 02/02/2023 0828   GFRNONAA >60 02/02/2023 0828   GFRAA  01/18/2022 0405    UNSATISFACTORY SPECIMEN. CLIENT REQUESTS SPECIMEN TO BE ANALYZED.   Lab Results  Component Value Date   WBC 14.9 (H) 02/15/2023   NEUTROABS 10.0 (H) 02/15/2023   HGB 17.4 (H) 02/15/2023   HCT 49.1 (H) 02/15/2023   MCV 87.4 02/15/2023   PLT 335 02/15/2023   Imaging:  [facility] Clinician Interpretation: I have personally reviewed the CNS images as listed.  My interpretation, in the context of the patient's clinical presentation, is progressive disease  MR BRAIN W WO CONTRAST Result Date: 02/11/2023 CLINICAL DATA:  Pleomorphic exam through astrocytoma EXAM: MRI HEAD WITHOUT AND WITH CONTRAST TECHNIQUE: Multiplanar, multiecho pulse sequences of the brain and surrounding structures were obtained without and with intravenous contrast. CONTRAST:  5.1mL GADAVIST GADOBUTROL 1 MMOL/ML IV SOLN COMPARISON:  12/31/2022 MRI head FINDINGS: Evaluation is somewhat limited as the patient became agitated after contrast was given, and the exam was terminated  prior to acquisition of the coronal and sagittal postcontrast sequences. The postcontrast T1 sequence obtained is severely motion limited. Brain: The post-contrast axial sequence is of limited diagnostic utility; within this limitation, again noted is enhancement in the right temporal lobe, which extends into the right parietal lobe. Possible ependymal thickening and enhancement. On diffusion-weighted images, there is slightly increased restricted diffusion in the right temporal lobe (series 5, image 54). This area is associated with scattered foci of hemosiderin deposition (series 10, image 29), which is slightly increased, particularly in the right occipital lobe (series 10, images 20 7-28 Redemonstrated extensive T2 hyperintensity in the adjacent white matter with localized swelling, with slightly increased extension into the anterior right frontal lobe (series 9, images 27 and 32); again noted is extension of this signal into the right basal ganglia and thalamus, with slightly increased extension into the right midbrain (series 9, image 21). This results in increased mass effect on the right occipital horn (series 9, image 22) and right lateral ventricle atrium, without evidence of entrapment of the right temporal horn. 5 mm right-to-left midline shift, previously 2 mm. No finding suggestive of acute or subacute infarct. No evidence of acute hemorrhage or extra-axial collection. Vascular: Normal arterial flow voids. Normal arterial and venous enhancement. Skull and upper cervical spine: Normal marrow signal. Sinuses/Orbits: Mucosal thickening in the left maxillary sinus and ethmoid air cells. Status post bilateral lens replacements. Other: The mastoid air cells are well aerated. IMPRESSION: 1. Evaluation is somewhat limited as the patient became agitated after contrast was given, and the exam was terminated prior to acquisition of the coronal and sagittal postcontrast sequences. The postcontrast T1 sequence  obtained is severely motion limited. 2. Within this limitation, there is redemonstrated enhancement in the right  temporal lobe, which extends into the right parietal lobe, with slightly increased restricted diffusion in the right temporal lobe, as well as slightly increased hemosiderin deposition in the right occipital lobe. These findings are concerning for progression of disease. 3. Redemonstrated extensive T2 hyperintensity in the adjacent white matter with localized swelling, with slightly increased extension into the anterior right frontal lobe and right midbrain. This results in increased mass effect on the right occipital horn and right lateral ventricle atrium, without evidence of entrapment of the right temporal horn. 4. 5 mm right-to-left midline shift, previously 2 mm. Electronically Signed   By: Wiliam Ke M.D.   On: 02/11/2023 23:48    Assessment/Plan Radiation therapy induced brain necrosis - Plan: CBC with Differential (Cancer Center Only), CBC with Differential (Cancer Center Only), Total Protein, Urine dipstick, CBC with Differential (Cancer Center Only), Infusion Appointment Request, Clinic Appointment Request, Lab Appointment Request, Infusion Appointment Request, Clinic Appointment Request, Lab Appointment Request, Infusion Appointment Request, Clinic Appointment Request, Lab Appointment Request, CANCELED: Infusion Appointment Request, CANCELED: Clinic Appointment Request, CANCELED: Lab Appointment Request, CANCELED: Infusion Appointment Request, CANCELED: Clinic Appointment Request, CANCELED: Lab Appointment Request, CANCELED: Infusion Appointment Request, CANCELED: Clinic Appointment Request, CANCELED: Lab Appointment Request  Pleomorphic xanthoastrocytoma of brain [facility]) - Plan: CBC with Differential (Cancer Center Only), CBC with Differential (Cancer Center Only), Total Protein, Urine dipstick, CBC with Differential (Cancer Center Only), Infusion Appointment Request, Clinic  Appointment Request, Lab Appointment Request, Infusion Appointment Request, Clinic Appointment Request, Lab Appointment Request, Infusion Appointment Request, Clinic Appointment Request, Lab Appointment Request, CANCELED: Infusion Appointment Request, CANCELED: Clinic Appointment Request, CANCELED: Lab Appointment Request, CANCELED: Infusion Appointment Request, CANCELED: Clinic Appointment Request, CANCELED: Lab Appointment Request, CANCELED: Infusion Appointment Request, CANCELED: Clinic Appointment Request, CANCELED: Lab Appointment Request  Armando Ullery remains with disabling encephalopathy from combination of tumor progression (from November 14 MRI), refractory inflammation/radiation necrosis (despite 4mg  decadron), 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  Dabrafenib (11/25) and Trametinib (12/2) were only recently initiated following approval process.  MRI brain was reviewed, will serve as new baseline.  She is cleared to initiate avastin today.   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  She previously tolerated and responded well to avastin (2023) -Repeat brain MRI prior to avastin to establish new baseline, then re-image after 4 cycles -Decadron to stay at 4mg  daily until avastin initiated -Con't physical therapy at SNF -Con't Keppra 1500mg  BID, Lamictal 150mg  BID   We ask that Germaine Pomfret Lapage return to clinic in 2 weeks for cycle #2 avastin, or sooner if needed.  All questions were answered. The patient knows to call the clinic with any problems, questions or concerns. No barriers to learning were detected.  The total time spent in the encounter was 30 minutes and more than 50% was on counseling and review of test results   Henreitta Leber, MD Medical Director of Neuro-Oncology [facility] at  [place] 02/15/23 10:20 AM

## 2023-02-15 NOTE — Patient Instructions (Signed)
[facility] A DEPT OF [facility]  Discharge Instructions: Thank you for choosing [facility] to provide your oncology and hematology care.   If you have a lab appointment with the Cancer Center, please go directly to the Cancer Center and check in at the registration area.   Wear comfortable clothing and clothing appropriate for easy access to any Portacath or PICC line.   We strive to give you quality time with your provider. You may need to reschedule your appointment if you arrive late (15 or more minutes).  Arriving late affects you and other patients whose appointments are after yours.  Also, if you miss three or more appointments without notifying the office, you may be dismissed from the clinic at the provider's discretion.      For prescription refill requests, have your pharmacy contact our office and allow 72 hours for refills to be completed.    Today you received the following chemotherapy and/or immunotherapy agents Mvasi      To help prevent nausea and vomiting after your treatment, we encourage you to take your nausea medication as directed.  BELOW ARE SYMPTOMS THAT SHOULD BE REPORTED IMMEDIATELY: *FEVER GREATER THAN 100.4 F (38 C) OR HIGHER *CHILLS OR SWEATING *NAUSEA AND VOMITING THAT IS NOT CONTROLLED WITH YOUR NAUSEA MEDICATION *UNUSUAL SHORTNESS OF BREATH *UNUSUAL BRUISING OR BLEEDING *URINARY PROBLEMS (pain or burning when urinating, or frequent urination) *BOWEL PROBLEMS (unusual diarrhea, constipation, pain near the anus) TENDERNESS IN MOUTH AND THROAT WITH OR WITHOUT PRESENCE OF ULCERS (sore throat, sores in mouth, or a toothache) UNUSUAL RASH, SWELLING OR PAIN  UNUSUAL VAGINAL DISCHARGE OR ITCHING   Items with * indicate a potential emergency and should be followed up as soon as possible or go to the Emergency Department if any problems should occur.  Please show the CHEMOTHERAPY ALERT CARD or IMMUNOTHERAPY ALERT  CARD at check-in to the Emergency Department and triage nurse.  Should you have questions after your visit or need to cancel or reschedule your appointment, please contact [facility]  Dept: [phone]  and follow the prompts.  Office hours are 8:00 a.m. to 4:30 p.m. Monday - Friday. Please note that voicemails left after 4:00 p.m. may not be returned until the following business day.  We are closed weekends and major holidays. You have access to a nurse at all times for urgent questions. Please call the main number to the clinic Dept: [phone] and follow the prompts.   For any non-urgent questions, you may also contact your provider using MyChart. We now offer e-Visits for anyone 5 and older to request care online for non-urgent symptoms. For details visit [email or web address].   Also download the MyChart app! Go to the app store, search "MyChart", open the app, select [facility], and log in with your MyChart username and password.  Bevacizumab Injection What is this medication? BEVACIZUMAB (be va SIZ yoo mab) treats some types of cancer. It works by blocking a protein that causes cancer cells to grow and multiply. This helps to slow or stop the spread of cancer cells. It is a monoclonal antibody. This medicine may be used for other purposes; ask your health care provider or pharmacist if you have questions. COMMON BRAND NAME(S): Alymsys, Avastin, MVASI, Omer Jack What should I tell my care team before I take this medication? They need to know if you have  any of these conditions: Blood clots Coughing up blood Having or recent surgery Heart failure High blood pressure History of a connection between 2 or more body parts that do not usually connect (fistula) History of a tear in your stomach or intestines Protein in your urine An unusual or allergic reaction to bevacizumab, other medications, foods, dyes, or  preservatives Pregnant or trying to get pregnant Breast-feeding How should I use this medication? This medication is injected into a vein. It is given by your care team in a hospital or clinic setting. Talk to your care team the use of this medication in children. Special care may be needed. Overdosage: If you think you have taken too much of this medicine contact a poison control center or emergency room at once. NOTE: This medicine is only for you. Do not share this medicine with others. What if I miss a dose? Keep appointments for follow-up doses. It is important not to miss your dose. Call your care team if you are unable to keep an appointment. What may interact with this medication? Interactions are not expected. This list may not describe all possible interactions. Give your health care provider a list of all the medicines, herbs, non-prescription drugs, or dietary supplements you use. Also tell them if you smoke, drink alcohol, or use illegal drugs. Some items may interact with your medicine. What should I watch for while using this medication? Your condition will be monitored carefully while you are receiving this medication. You may need blood work while taking this medication. This medication may make you feel generally unwell. This is not uncommon as chemotherapy can affect healthy cells as well as cancer cells. Report any side effects. Continue your course of treatment even though you feel ill unless your care team tells you to stop. This medication may increase your risk to bruise or bleed. Call your care team if you notice any unusual bleeding. Before having surgery, talk to your care team to make sure it is ok. This medication can increase the risk of poor healing of your surgical site or wound. You will need to stop this medication for 28 days before surgery. After surgery, wait at least 28 days before restarting this medication. Make sure the surgical site or wound is healed enough  before restarting this medication. Talk to your care team if questions. Talk to your care team if you may be pregnant. Serious birth defects can occur if you take this medication during pregnancy and for 6 months after the last dose. Contraception is recommended while taking this medication and for 6 months after the last dose. Your care team can help you find the option that works for you. Do not breastfeed while taking this medication and for 6 months after the last dose. This medication can cause infertility. Talk to your care team if you are concerned about your fertility. What side effects may I notice from receiving this medication? Side effects that you should report to your care team as soon as possible: Allergic reactions--skin rash, itching, hives, swelling of the face, lips, tongue, or throat Bleeding--bloody or black, tar-like stools, vomiting blood or brown material that looks like coffee grounds, red or dark brown urine, small red or purple spots on skin, unusual bruising or bleeding Blood clot--pain, swelling, or warmth in the leg, shortness of breath, chest pain Heart attack--pain or tightness in the chest, shoulders, arms, or jaw, nausea, shortness of breath, cold or clammy skin, feeling faint or lightheaded Heart failure--shortness of  breath, swelling of the ankles, feet, or hands, sudden weight gain, unusual weakness or fatigue Increase in blood pressure Infection--fever, chills, cough, sore throat, wounds that don't heal, pain or trouble when passing urine, general feeling of discomfort or being unwell Infusion reactions--chest pain, shortness of breath or trouble breathing, feeling faint or lightheaded Kidney injury--decrease in the amount of urine, swelling of the ankles, hands, or feet Stomach pain that is severe, does not go away, or gets worse Stroke--sudden numbness or weakness of the face, arm, or leg, trouble speaking, confusion, trouble walking, loss of balance or  coordination, dizziness, severe headache, change in vision Sudden and severe headache, confusion, change in vision, seizures, which may be signs of posterior reversible encephalopathy syndrome (PRES) Side effects that usually do not require medical attention (report to your care team if they continue or are bothersome): Back pain Change in taste Diarrhea Dry skin Increased tears Nosebleed This list may not describe all possible side effects. Call your doctor for medical advice about side effects. You may report side effects to FDA at 1-800-FDA-1088. Where should I keep my medication? This medication is given in a hospital or clinic. It will not be stored at home. NOTE: This sheet is a summary. It may not cover all possible information. If you have questions about this medicine, talk to your doctor, pharmacist, or health care provider.  2024 Elsevier/Gold Standard (2021-06-20 00:00:00)

## 2023-02-15 NOTE — Progress Notes (Signed)
Nutrition  RD came to infusion suite to see patient before scheduled appointment time but infusion completed early per RN [name]. Caregiver reported to RN that she could not stay and needed to get patient back to SNF so she could get another resident to dialysis.    Daevon Holdren B. Freida Busman, RD, LDN Registered Dietitian [phone]

## 2023-02-16 ENCOUNTER — Telehealth: Payer: Self-pay

## 2023-02-16 ENCOUNTER — Telehealth: Payer: Self-pay | Admitting: Internal Medicine

## 2023-02-16 NOTE — Telephone Encounter (Signed)
-----   Message from Nurse Julieanne Manson sent at 02/15/2023  2:11 PM EST ----- Regarding: Vaslow 1st Tx F/U call - Mvasi Vaslow 1st Tx F/U call - Mvasi.  Tolerated infusion well.

## 2023-02-16 NOTE — Telephone Encounter (Signed)
 Rehab called to adjust schedeuled appts.

## 2023-02-16 NOTE — Telephone Encounter (Signed)
 Lindsey R. States that Lindsey Daniels is doing well. She is eating more then she usually does. She is drinking and urinating well. She is A&O. She has been up today a litter more then she has been. She is going to ly down after finishing her lunch.

## 2023-02-16 NOTE — Telephone Encounter (Signed)
 Brother Lacie Draft said that Ms. Mcmeekin is at [facility]. Phone is [phone].

## 2023-02-17 ENCOUNTER — Other Ambulatory Visit: Payer: Self-pay

## 2023-02-19 ENCOUNTER — Other Ambulatory Visit: Payer: Self-pay

## 2023-03-01 ENCOUNTER — Inpatient Hospital Stay: Payer: Medicare Other | Admitting: Internal Medicine

## 2023-03-01 ENCOUNTER — Inpatient Hospital Stay: Payer: Medicare Other

## 2023-03-02 ENCOUNTER — Inpatient Hospital Stay (HOSPITAL_BASED_OUTPATIENT_CLINIC_OR_DEPARTMENT_OTHER): Payer: Medicare Other | Admitting: Internal Medicine

## 2023-03-02 ENCOUNTER — Inpatient Hospital Stay: Payer: Medicare Other

## 2023-03-02 ENCOUNTER — Inpatient Hospital Stay: Payer: Medicare Other | Attending: Internal Medicine

## 2023-03-02 VITALS — BP 127/70 | HR 64 | Temp 98.0°F | Resp 18 | Wt 122.5 lb

## 2023-03-02 DIAGNOSIS — I6789 Other cerebrovascular disease: Secondary | ICD-10-CM

## 2023-03-02 DIAGNOSIS — C719 Malignant neoplasm of brain, unspecified: Secondary | ICD-10-CM

## 2023-03-02 DIAGNOSIS — Z79899 Other long term (current) drug therapy: Secondary | ICD-10-CM | POA: Insufficient documentation

## 2023-03-02 DIAGNOSIS — C712 Malignant neoplasm of temporal lobe: Secondary | ICD-10-CM

## 2023-03-02 DIAGNOSIS — G40909 Epilepsy, unspecified, not intractable, without status epilepticus: Secondary | ICD-10-CM

## 2023-03-02 DIAGNOSIS — Z5112 Encounter for antineoplastic immunotherapy: Secondary | ICD-10-CM | POA: Insufficient documentation

## 2023-03-02 LAB — CBC WITH DIFFERENTIAL (CANCER CENTER ONLY)
Abs Immature Granulocytes: 0.03 10*3/uL (ref 0.00–0.07)
Basophils Absolute: 0 10*3/uL (ref 0.0–0.1)
Basophils Relative: 1 %
Eosinophils Absolute: 0.2 10*3/uL (ref 0.0–0.5)
Eosinophils Relative: 2 %
HCT: 42 % (ref 36.0–46.0)
Hemoglobin: 14.3 g/dL (ref 12.0–15.0)
Immature Granulocytes: 0 %
Lymphocytes Relative: 24 %
Lymphs Abs: 1.7 10*3/uL (ref 0.7–4.0)
MCH: 30.4 pg (ref 26.0–34.0)
MCHC: 34 g/dL (ref 30.0–36.0)
MCV: 89.4 fL (ref 80.0–100.0)
Monocytes Absolute: 0.5 10*3/uL (ref 0.1–1.0)
Monocytes Relative: 7 %
Neutro Abs: 4.6 10*3/uL (ref 1.7–7.7)
Neutrophils Relative %: 66 %
Platelet Count: 194 10*3/uL (ref 150–400)
RBC: 4.7 MIL/uL (ref 3.87–5.11)
RDW: 14.4 % (ref 11.5–15.5)
WBC Count: 7 10*3/uL (ref 4.0–10.5)
nRBC: 0 % (ref 0.0–0.2)

## 2023-03-02 LAB — CMP (CANCER CENTER ONLY)
ALT: 15 U/L (ref 0–44)
AST: 22 U/L (ref 15–41)
Albumin: 3.9 g/dL (ref 3.5–5.0)
Alkaline Phosphatase: 75 U/L (ref 38–126)
Anion gap: 6 (ref 5–15)
BUN: 11 mg/dL (ref 8–23)
CO2: 27 mmol/L (ref 22–32)
Calcium: 9.3 mg/dL (ref 8.9–10.3)
Chloride: 104 mmol/L (ref 98–111)
Creatinine: 0.46 mg/dL (ref 0.44–1.00)
GFR, Estimated: >60 mL/min (ref >60)
Glucose, Bld: 96 mg/dL (ref 70–99)
Potassium: 3.6 mmol/L (ref 3.5–5.1)
Sodium: 137 mmol/L (ref 135–145)
Total Bilirubin: 0.6 mg/dL (ref 0.0–1.2)
Total Protein: 6.1 g/dL — ABNORMAL LOW (ref 6.5–8.1)

## 2023-03-02 MED ORDER — SODIUM CHLORIDE 0.9 % IV SOLN
10.0000 mg/kg | Freq: Once | INTRAVENOUS | Status: AC
  Administered 2023-03-02: 600 mg via INTRAVENOUS
  Filled 2023-03-02: qty 8

## 2023-03-02 MED ORDER — SODIUM CHLORIDE 0.9 % IV SOLN: INTRAVENOUS | Status: DC

## 2023-03-02 NOTE — Patient Instructions (Signed)
 [facility]  Discharge Instructions: Thank you for choosing [facility] to provide your oncology and hematology care.   If you have a lab appointment with the Cancer Center, please go directly to the Cancer Center and check in at the registration area.   Wear comfortable clothing and clothing appropriate for easy access to any Portacath or PICC line.   We strive to give you quality time with your provider. You may need to reschedule your appointment if you arrive late (15 or more minutes).  Arriving late affects you and other patients whose appointments are after yours.  Also, if you miss three or more appointments without notifying the office, you may be dismissed from the clinic at the provider's discretion.      For prescription refill requests, have your pharmacy contact our office and allow 72 hours for refills to be completed.    Today you received the following chemotherapy and/or immunotherapy agents: bevacizumab      To help prevent nausea and vomiting after your treatment, we encourage you to take your nausea medication as directed.  BELOW ARE SYMPTOMS THAT SHOULD BE REPORTED IMMEDIATELY: *FEVER GREATER THAN 100.4 F (38 C) OR HIGHER *CHILLS OR SWEATING *NAUSEA AND VOMITING THAT IS NOT CONTROLLED WITH YOUR NAUSEA MEDICATION *UNUSUAL SHORTNESS OF BREATH *UNUSUAL BRUISING OR BLEEDING *URINARY PROBLEMS (pain or burning when urinating, or frequent urination) *BOWEL PROBLEMS (unusual diarrhea, constipation, pain near the anus) TENDERNESS IN MOUTH AND THROAT WITH OR WITHOUT PRESENCE OF ULCERS (sore throat, sores in mouth, or a toothache) UNUSUAL RASH, SWELLING OR PAIN  UNUSUAL VAGINAL DISCHARGE OR ITCHING   Items with * indicate a potential emergency and should be followed up as soon as possible or go to the Emergency Department if any problems should occur.  Please show the CHEMOTHERAPY ALERT CARD or  IMMUNOTHERAPY ALERT CARD at check-in to the Emergency Department and triage nurse.  Should you have questions after your visit or need to cancel or reschedule your appointment, please contact [facility]  Dept: [phone]  and follow the prompts.  Office hours are 8:00 a.m. to 4:30 p.m. Monday - Friday. Please note that voicemails left after 4:00 p.m. may not be returned until the following business day.  We are closed weekends and major holidays. You have access to a nurse at all times for urgent questions. Please call the main number to the clinic Dept: [phone] and follow the prompts.   For any non-urgent questions, you may also contact your provider using MyChart. We now offer e-Visits for anyone 81 and older to request care online for non-urgent symptoms. For details visit [email or web address].   Also download the MyChart app! Go to the app store, search "MyChart", open the app, select [facility], and log in with your MyChart username and password.

## 2023-03-02 NOTE — Progress Notes (Signed)
 [facility] [address]  [place], [place] [address] [phone]   Interval Evaluation  Date of Service: 03/02/23 Patient Name: Lindsey Daniels Patient MRN: [id number] Patient DOB: May 01, 1955 Provider: Arthea MARLA Manns, MD  Identifying Statement:  Lindsey Daniels is a 68 y.o. female with right temporal  pleomorphic xanthoastrocytoma    Oncologic History: Oncology History  Pleomorphic xanthoastrocytoma of brain [facility])  09/29/2013 Surgery   Right temporal craniotomy, resection with Dr. Alix.  Path is PXA WHO II.   11/29/2020 Surgery   Disease progression, second craniotomy with Dr. Gillie, sub-total resection.  Path is unchanged.   02/21/2021 - 04/02/2021 Radiation Therapy   IMRT radiation therapy with Dr. Dewey   12/16/2021 Treatment Plan Change   Completes 4 cycles of avastin  10mg /kg for severe inflammation, poor tolerance of steroids   12/02/2022 Progression   Progression of Disease   01/11/2023 -  Chemotherapy   Initiates dual BRAF/MEK inhibition with dabrafenib+trametinib    02/15/2023 -  Chemotherapy   Patient is on Treatment Plan : BRAIN GBM Bevacizumab  14d x 6 cycles       Biomarkers:   Interval History: Lindsey Daniels presents today for cycle #2 avastin  infusion.  She continues to reside in the skilled nursing facility.  They have noticed a small increase in alertness since first infusion.  Remains non ambulatory due to significant left sided weakness.  More recently she has started macrobid for a UTI. No frank seizures that are confirmed by nursing aide at bedside today.  She has started on combined BRAF/MEK inhibitory therapy, start date 11/25 per nursing charts.  Decadron  is still at 2mg  daily.  Prior: She describes twitching of the left side which persisted for more than 2 hours.  During admission Keppra  was increased  to 1500mg  twice per day, decadron  restarted 4mg  twice per day.  She remains quite weak on the left  side compared to prior, particularly with the arm.  She is walking minimally with the walker, mostly using the wheelchair.  Very high levels of irritability, mood swings noted at the facility.  She is confused at times, but falls have been avoided.  H+P (01/30/21) Patient presents today after recent re-resection for progressive right temporal PXA.  She initially presented with a seizure in 2015; imaging demonstrated enhancing right temporal lesion which was grossly resected by Dr. Alix.  After last follow up in 2020, she began to experience headaches, ear fullness and some confusion this fall.  Imaging demonstrated marked progression of disease; she went for second surgery on 11/29/20.  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   Medications: Current Outpatient Medications on File Prior to Visit  Medication Sig Dispense Refill   acetaminophen  (TYLENOL ) 650 MG CR tablet Take 650 mg by mouth every 8 (eight) hours as needed for pain.     apixaban  (ELIQUIS ) 5 MG TABS tablet Take 1 tablet (5 mg total) by mouth 2 (two) times daily. 60 tablet 2   atorvastatin  (LIPITOR) 20 MG tablet Take 1 tablet (20 mg total) by mouth daily. 90 tablet 3   cetirizine  (ZYRTEC ) 10 MG tablet Take 10 mg by mouth daily.     dabrafenib mesylate  (TAFINLAR ) 75 MG capsule Take 2 capsules (150 mg total) by mouth 2 (two) times daily. Take on an empty stomach 1 hour before or 2 hours after meals. 120 capsule 1   dexamethasone  (  DECADRON ) 4 MG tablet Take 0.5 tablets (2 mg total) by mouth daily.     fluticasone  (FLONASE ) 50 MCG/ACT nasal spray Place 1-2 sprays into both nostrils daily. (Patient taking differently: Place 2 sprays into both nostrils daily.) 16 g 0   lamoTRIgine  (LAMICTAL ) 150 MG tablet Take 1 tablet (150 mg total) by mouth 2 (two) times daily.     levETIRAcetam  (KEPPRA ) 750 MG tablet Take 2 tablets (1,500 mg total) by mouth 2 (two) times  daily. 120 tablet 1   metoprolol  tartrate (LOPRESSOR ) 25 MG tablet Take 12.5 mg by mouth 2 (two) times daily.     Midazolam  (NAYZILAM ) 5 MG/0.1ML SOLN Place 5 mg into the nose every 6 (six) hours as needed (prolonged seizure, greater than 5 minutes). 1 each 0   Multiple Vitamin (MULTIVITAMIN WITH MINERALS) TABS tablet Take 1 tablet by mouth daily.     omeprazole  (PRILOSEC) 20 MG capsule Take 20 mg by mouth daily.     ondansetron  (ZOFRAN ) 4 MG tablet Take 4 mg by mouth every 6 (six) hours as needed for nausea or vomiting.     potassium chloride  SA (KLOR-CON  M) 20 MEQ tablet Take 1 tablet (20 mEq total) by mouth 2 (two) times daily. 30 tablet 1   sertraline  (ZOLOFT ) 100 MG tablet Take 1 tablet (100 mg total) by mouth daily. 90 tablet 3   sertraline  (ZOLOFT ) 50 MG tablet Take 50 mg by mouth daily.     trametinib  dimethyl sulfoxide  (MEKINIST ) 2 MG tablet Take 1 tablet (2 mg total) by mouth daily. Take 1 hour before or 2 hours after a meal. Store refrigerated in original container. 30 tablet 1   No current facility-administered medications on file prior to visit.    Allergies:  Allergies  Allergen Reactions   Rifapentine Other (See Comments)    Flu-like symptoms   Amoxicillin Rash   Clavulanic Acid Rash   Past Medical History:  Past Medical History:  Diagnosis Date   Allergy    Anal fissure    Anxiety    Cataract    Depression    Elevated LFTs    Hyperglycemia    Hyperlipidemia    Hypertension    Osteoarthritis    Pars defect of lumbar spine    Pleomorphic xanthoastrocytoma of temporal lobe (HCC)    Right   Seizures (HCC)    Spondylolisthesis    lumbar   Thrombocytopenia (HCC) 12/31/2022   Past Surgical History:  Past Surgical History:  Procedure Laterality Date   cataract     CRANIOTOMY N/A 10/02/2013   Procedure: 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;  Surgeon: Lamar LELON Peaches, MD;  Location: [facility];  Service: Neurosurgery;  Laterality: N/A;  Craniotomy  for resection of tumor with stealth   CRANIOTOMY Right 11/29/2020   Procedure: Right Parietal craniotomy for tumor resection;  Surgeon: Gillie Duncans, MD;  Location: [facility] OR;  Service: Neurosurgery;  Laterality: Right;   HEEL SPUR SURGERY     Social History:  Social History   Socioeconomic History   Marital status: Divorced    Spouse name: separated-no papers file   Number of children: 0   Years of education: Not on file   Highest education level: Not on file  Occupational History   Occupation: RT    Employer: [employer]    Comment: [employer] and [employer]  Tobacco Use   Smoking status: Never   Smokeless tobacco: Never  Substance and Sexual  Activity   Alcohol use: Yes    Alcohol/week: 0.0 - 4.0 standard drinks of alcohol    Comment: social   Drug use: No   Sexual activity: Not Currently    Partners: Male  Other Topics Concern   Not on file  Social History Narrative   Separated from second husband.  Very contentious split.   Social Drivers of Corporate Investment Banker Strain: Low Risk  (01/15/2022)   Overall Financial Resource Strain (CARDIA)    Difficulty of Paying Living Expenses: Not hard at all  Food Insecurity: Patient Unable To Answer (12/31/2022)   Hunger Vital Sign    Worried About Programme Researcher, Broadcasting/film/video in the Last Year: Patient unable to answer    Ran Out of Food in the Last Year: Patient unable to answer  Transportation Needs: Patient Unable To Answer (12/31/2022)   PRAPARE - Transportation    Lack of Transportation (Medical): Patient unable to answer    Lack of Transportation (Non-Medical): Patient unable to answer  Physical Activity: Inactive (02/03/2021)   [facility], [facility]   Exercise Vital Sign    Days of Exercise per Week: 0 days    Minutes of Exercise per Session: 0 min  Stress: Stress Concern Present (02/03/2021)   Received from [facility], [facility]  Occupational Health - Occupational Stress Questionnaire    Feeling of Stress : To some extent  Social Connections: Unknown (06/16/2021)   [facility] from [facility], [facility]   Social Network    Social Network: Not on file  Intimate Partner Violence: Patient Unable To Answer (12/31/2022)   Humiliation, Afraid, Rape, and Kick questionnaire    Fear of Current or Ex-Partner: Patient unable to answer    Emotionally Abused: Patient unable to answer    Physically Abused: Patient unable to answer    Sexually Abused: Patient unable to answer   Family History:  Family History  Problem Relation Age of Onset   Mental illness Father        depression   COPD Father    Alcohol abuse Brother    Breast cancer Neg Hx     Review of Systems: Constitutional: Doesn't report fevers, chills or abnormal weight loss Eyes: Doesn't report blurriness of vision Ears, nose, mouth, throat, and face: Doesn't report sore throat Respiratory: +dyspnea Cardiovascular: Doesn't report palpitation, chest discomfort  Gastrointestinal:  Doesn't report nausea, constipation, diarrhea GU: Doesn't report incontinence Skin: Doesn't report skin rashes Neurological: Per HPI Musculoskeletal: Doesn't report joint pain Behavioral/Psych: Doesn't report anxiety  Physical Exam: Vitals:   03/02/23 1228  BP: 127/70  Pulse: 64  Resp: 18  Temp: 98 F (36.7 C)  SpO2: 99%    KPS: 60. General: laying down, eyes closed Head: Normal EENT: No conjunctival injection or scleral icterus.  Lungs: Normal Cardiac: Regular rate Abdomen: Non-distended abdomen Skin: +purpura, arms and legs Extremities: +edema  Neurologic Exam: Mental Status: Drowsy, alert, attentive to examiner with tactile stimulation. Oriented to self and environment when attentive. Language is fluent with intact comprehension.  Displays some disorganized thinking. Age advanced psychomotor slowing. Cranial Nerves: Visual acuity is grossly normal. Visual  fields are full. Extra-ocular movements intact. No ptosis. Face is symmetric Motor: Tone and bulk are normal. Power is 2/5 in left arm, 4/5 in left leg. Reflexes are symmetric, no pathologic reflexes present.  Sensory: Intact to light touch Gait: Non ambulatory   Labs: I have reviewed the data as listed  Component Value Date/Time   NA 137 03/02/2023 1211   NA 142 06/30/2017 1815   K 3.6 03/02/2023 1211   CL 104 03/02/2023 1211   CO2 27 03/02/2023 1211   GLUCOSE 96 03/02/2023 1211   BUN 11 03/02/2023 1211   BUN 14 06/30/2017 1815   CREATININE 0.46 03/02/2023 1211   CREATININE 0.72 06/07/2015 0825   CALCIUM  9.3 03/02/2023 1211   PROT 6.1 (L) 03/02/2023 1211   PROT 7.0 06/30/2017 1815   ALBUMIN 3.9 03/02/2023 1211   ALBUMIN 4.6 06/30/2017 1815   AST 22 03/02/2023 1211   ALT 15 03/02/2023 1211   ALKPHOS 75 03/02/2023 1211   BILITOT 0.6 03/02/2023 1211   GFRNONAA >60 03/02/2023 1211   GFRAA  01/18/2022 0405    UNSATISFACTORY SPECIMEN. CLIENT REQUESTS SPECIMEN TO BE ANALYZED.   Lab Results  Component Value Date   WBC 7.0 03/02/2023   NEUTROABS 4.6 03/02/2023   HGB 14.3 03/02/2023   HCT 42.0 03/02/2023   MCV 89.4 03/02/2023   PLT 194 03/02/2023   Imaging:  [facility] Clinician Interpretation: I have personally reviewed the CNS images as listed.  My interpretation, in the context of the patient's clinical presentation, is progressive disease  MR BRAIN W WO CONTRAST Result Date: 02/11/2023 CLINICAL DATA:  Pleomorphic exam through astrocytoma EXAM: MRI HEAD WITHOUT AND WITH CONTRAST TECHNIQUE: Multiplanar, multiecho pulse sequences of the brain and surrounding structures were obtained without and with intravenous contrast. CONTRAST:  5.5mL GADAVIST  GADOBUTROL  1 MMOL/ML IV SOLN COMPARISON:  12/31/2022 MRI head FINDINGS: Evaluation is somewhat limited as the patient became agitated after contrast was given, and the exam was terminated prior to acquisition of the coronal and sagittal  postcontrast sequences. The postcontrast T1 sequence obtained is severely motion limited. Brain: The post-contrast axial sequence is of limited diagnostic utility; within this limitation, again noted is enhancement in the right temporal lobe, which extends into the right parietal lobe. Possible ependymal thickening and enhancement. On diffusion-weighted images, there is slightly increased restricted diffusion in the right temporal lobe (series 5, image 54). This area is associated with scattered foci of hemosiderin deposition (series 10, image 29), which is slightly increased, particularly in the right occipital lobe (series 10, images 20 7-28 Redemonstrated extensive T2 hyperintensity in the adjacent white matter with localized swelling, with slightly increased extension into the anterior right frontal lobe (series 9, images 27 and 32); again noted is extension of this signal into the right basal ganglia and thalamus, with slightly increased extension into the right midbrain (series 9, image 21). This results in increased mass effect on the right occipital horn (series 9, image 22) and right lateral ventricle atrium, without evidence of entrapment of the right temporal horn. 5 mm right-to-left midline shift, previously 2 mm. No finding suggestive of acute or subacute infarct. No evidence of acute hemorrhage or extra-axial collection. Vascular: Normal arterial flow voids. Normal arterial and venous enhancement. Skull and upper cervical spine: Normal marrow signal. Sinuses/Orbits: Mucosal thickening in the left maxillary sinus and ethmoid air cells. Status post bilateral lens replacements. Other: The mastoid air cells are well aerated. IMPRESSION: 1. Evaluation is somewhat limited as the patient became agitated after contrast was given, and the exam was terminated prior to acquisition of the coronal and sagittal postcontrast sequences. The postcontrast T1 sequence obtained is severely motion limited. 2. Within this  limitation, there is redemonstrated enhancement in the right temporal lobe, which extends into the right parietal lobe, with slightly increased restricted diffusion in  the right temporal lobe, as well as slightly increased hemosiderin deposition in the right occipital lobe. These findings are concerning for progression of disease. 3. Redemonstrated extensive T2 hyperintensity in the adjacent white matter with localized swelling, with slightly increased extension into the anterior right frontal lobe and right midbrain. This results in increased mass effect on the right occipital horn and right lateral ventricle atrium, without evidence of entrapment of the right temporal horn. 4. 5 mm right-to-left midline shift, previously 2 mm. Electronically Signed   By: Donald Campion M.D.   On: 02/11/2023 23:48    Assessment/Plan Pleomorphic xanthoastrocytoma of brain [facility])  Seizure disorder (HCC)  Radiation therapy induced brain necrosis  Ritisha Deitrick remains with disabling encephalopathy from combination of tumor progression (from November 14 MRI), refractory inflammation/radiation necrosis (despite 4mg  decadron ), 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  Dabrafenib (11/25) and Trametinib  (12/2) were only recently initiated following approval process.  She is cleared for cycle #2 avastin  today.   Recommended the following: -Continue BRAF/MEK inhibition with Dabrafenib and Trametinib  -Con't avastin  10mg /kg IV q2 weeks x4 cycles given ongoing burden of inflammation, suspected refractory RN.  She previously tolerated and responded well to avastin  (2023) -Repeat brain MRI after 4 cycles -Decadron  to decrease to 1mg  daily x7 days, then STOP -Con't physical therapy at SNF -Con't Keppra  1500mg  BID, Lamictal  150mg  BID   We ask that Ronal Slater Morimoto return to clinic in 2 weeks for cycle #3 avastin , or sooner if needed.  All questions were answered. The patient knows to call the clinic with any  problems, questions or concerns. No barriers to learning were detected.  The total time spent in the encounter was 30 minutes and more than 50% was on counseling and review of test results   Arthea MARLA Manns, MD Medical Director of Neuro-Oncology [facility] [place] [facility] 03/02/23 12:55 PM

## 2023-03-03 ENCOUNTER — Inpatient Hospital Stay: Payer: Medicare Other

## 2023-03-15 ENCOUNTER — Other Ambulatory Visit: Payer: Medicare Other

## 2023-03-15 ENCOUNTER — Ambulatory Visit: Payer: Medicare Other

## 2023-03-15 ENCOUNTER — Ambulatory Visit: Payer: Medicare Other | Admitting: Internal Medicine

## 2023-03-16 ENCOUNTER — Inpatient Hospital Stay: Payer: Medicare Other | Admitting: Internal Medicine

## 2023-03-16 ENCOUNTER — Inpatient Hospital Stay: Payer: Medicare Other

## 2023-03-16 ENCOUNTER — Inpatient Hospital Stay: Payer: Medicare Other | Admitting: Nutrition

## 2023-03-16 VITALS — BP 117/63 | HR 79 | Temp 98.0°F | Resp 16

## 2023-03-16 VITALS — BP 120/76 | HR 96 | Temp 97.7°F | Resp 18

## 2023-03-16 DIAGNOSIS — G40909 Epilepsy, unspecified, not intractable, without status epilepticus: Secondary | ICD-10-CM

## 2023-03-16 DIAGNOSIS — Z5112 Encounter for antineoplastic immunotherapy: Secondary | ICD-10-CM | POA: Diagnosis not present

## 2023-03-16 DIAGNOSIS — I6789 Other cerebrovascular disease: Secondary | ICD-10-CM

## 2023-03-16 DIAGNOSIS — C719 Malignant neoplasm of brain, unspecified: Secondary | ICD-10-CM

## 2023-03-16 LAB — CMP (CANCER CENTER ONLY)
ALT: 9 U/L (ref 0–44)
AST: 16 U/L (ref 15–41)
Albumin: 4 g/dL (ref 3.5–5.0)
Alkaline Phosphatase: 78 U/L (ref 38–126)
Anion gap: 7 (ref 5–15)
BUN: 12 mg/dL (ref 8–23)
CO2: 27 mmol/L (ref 22–32)
Calcium: 9.6 mg/dL (ref 8.9–10.3)
Chloride: 102 mmol/L (ref 98–111)
Creatinine: 0.63 mg/dL (ref 0.44–1.00)
GFR, Estimated: >60 mL/min (ref >60)
Glucose, Bld: 95 mg/dL (ref 70–99)
Potassium: 3.6 mmol/L (ref 3.5–5.1)
Sodium: 136 mmol/L (ref 135–145)
Total Bilirubin: 0.5 mg/dL (ref 0.0–1.2)
Total Protein: 6.5 g/dL (ref 6.5–8.1)

## 2023-03-16 LAB — CBC WITH DIFFERENTIAL (CANCER CENTER ONLY)
Abs Immature Granulocytes: 0.02 10*3/uL (ref 0.00–0.07)
Basophils Absolute: 0 10*3/uL (ref 0.0–0.1)
Basophils Relative: 1 %
Eosinophils Absolute: 0.2 10*3/uL (ref 0.0–0.5)
Eosinophils Relative: 3 %
HCT: 38.6 % (ref 36.0–46.0)
Hemoglobin: 13.3 g/dL (ref 12.0–15.0)
Immature Granulocytes: 0 %
Lymphocytes Relative: 20 %
Lymphs Abs: 1.4 10*3/uL (ref 0.7–4.0)
MCH: 31.1 pg (ref 26.0–34.0)
MCHC: 34.5 g/dL (ref 30.0–36.0)
MCV: 90.2 fL (ref 80.0–100.0)
Monocytes Absolute: 0.8 10*3/uL (ref 0.1–1.0)
Monocytes Relative: 11 %
Neutro Abs: 4.5 10*3/uL (ref 1.7–7.7)
Neutrophils Relative %: 65 %
Platelet Count: 202 10*3/uL (ref 150–400)
RBC: 4.28 MIL/uL (ref 3.87–5.11)
RDW: 14.1 % (ref 11.5–15.5)
WBC Count: 7 10*3/uL (ref 4.0–10.5)
nRBC: 0 % (ref 0.0–0.2)

## 2023-03-16 MED ORDER — SODIUM CHLORIDE 0.9 % IV SOLN: INTRAVENOUS | Status: DC

## 2023-03-16 MED ORDER — SODIUM CHLORIDE 0.9 % IV SOLN
10.0000 mg/kg | Freq: Once | INTRAVENOUS | Status: AC
  Administered 2023-03-16: 600 mg via INTRAVENOUS
  Filled 2023-03-16: qty 8

## 2023-03-16 NOTE — Progress Notes (Signed)
[facility] at [facility] [address]  [place], [place] [address] [phone]   Interval Evaluation  Date of Service: 03/16/23 Patient Name: Lindsey Daniels Patient MRN: [id number] Patient DOB: [birth year] Provider: Henreitta Leber, MD  Identifying Statement:  Lindsey Daniels is a 68 y.o. female with right temporal  pleomorphic xanthoastrocytoma    Oncologic History: Oncology History  Pleomorphic xanthoastrocytoma of brain [facility])  09/29/2013 Surgery   Right temporal craniotomy, resection with Dr. Newell Coral.  Path is PXA WHO II.   11/29/2020 Surgery   Disease progression, second craniotomy with Dr. Franky Macho, sub-total resection.  Path is unchanged.   02/21/2021 - 04/02/2021 Radiation Therapy   IMRT radiation therapy with Dr. Mitzi Hansen   12/16/2021 Treatment Plan Change   Completes 4 cycles of avastin 10mg /kg for severe inflammation, poor tolerance of steroids   12/02/2022 Progression   Progression of Disease   01/11/2023 -  Chemotherapy   Initiates dual BRAF/MEK inhibition with dabrafenib+trametinib   02/15/2023 -  Chemotherapy   Patient is on Treatment Plan : BRAIN GBM Bevacizumab 14d x 6 cycles       Biomarkers:   Interval History: Lindsey Daniels presents today for cycle #3 avastin infusion.  She continues to reside in the skilled nursing facility.  Overall alertness continues to improve.  Remains non ambulatory due to significant left sided weakness.  No frank seizures that are confirmed by nursing aide at bedside today.  She has started on combined BRAF/MEK inhibitory therapy, start date 11/25 per nursing charts.  Decadron is now discontinued.  Prior: She describes twitching of the left side which persisted for more than 2 hours.  During admission Keppra was increased  to 1500mg  twice per day, decadron restarted 4mg  twice per day.  She remains quite weak on the left side compared to prior, particularly with the arm.  She is walking minimally with  the walker, mostly using the wheelchair.  Very high levels of irritability, mood swings noted at the facility.  She is confused at times, but falls have been avoided.  H+P (01/30/21) Patient presents today after recent re-resection for progressive right temporal PXA.  She initially presented with a seizure in 2015; imaging demonstrated enhancing right temporal lesion which was grossly resected by Dr. Newell Coral.  After last follow up in 2020, she began to experience headaches, ear fullness and some confusion this fall.  Imaging demonstrated marked progression of disease; she went for second surgery on 11/29/20.  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   Medications: Current Outpatient Medications on File Prior to Visit  Medication Sig Dispense Refill   acetaminophen (TYLENOL) 650 MG CR tablet Take 650 mg by mouth every 8 (eight) hours as needed for pain.     apixaban (ELIQUIS) 5 MG TABS tablet Take 1 tablet (5 mg total) by mouth 2 (two) times daily. 60 tablet 2   atorvastatin (LIPITOR) 20 MG tablet Take 1 tablet (20 mg total) by mouth daily. 90 tablet 3   cetirizine (ZYRTEC) 10 MG tablet Take 10 mg by mouth daily.     dabrafenib mesylate (TAFINLAR) 75 MG capsule Take 2 capsules (150 mg total) by mouth 2 (two) times daily. Take on an empty stomach 1 hour before or 2 hours after meals. 120 capsule 1   dexamethasone (DECADRON) 4 MG tablet Take 0.5 tablets (2 mg total) by mouth daily.  fluticasone (FLONASE) 50 MCG/ACT nasal spray Place 1-2 sprays into both nostrils daily. (Patient taking differently: Place 2 sprays into both nostrils daily.) 16 g 0   lamoTRIgine (LAMICTAL) 150 MG tablet Take 1 tablet (150 mg total) by mouth 2 (two) times daily.     levETIRAcetam (KEPPRA) 750 MG tablet Take 2 tablets (1,500 mg total) by mouth 2 (two) times daily. 120 tablet 1   metoprolol tartrate (LOPRESSOR) 25 MG tablet Take 12.5 mg by  mouth 2 (two) times daily.     Midazolam (NAYZILAM) 5 MG/0.1ML SOLN Place 5 mg into the nose every 6 (six) hours as needed (prolonged seizure, greater than 5 minutes). 1 each 0   Multiple Vitamin (MULTIVITAMIN WITH MINERALS) TABS tablet Take 1 tablet by mouth daily.     omeprazole (PRILOSEC) 20 MG capsule Take 20 mg by mouth daily.     ondansetron (ZOFRAN) 4 MG tablet Take 4 mg by mouth every 6 (six) hours as needed for nausea or vomiting.     potassium chloride SA (KLOR-CON M) 20 MEQ tablet Take 1 tablet (20 mEq total) by mouth 2 (two) times daily. 30 tablet 1   sertraline (ZOLOFT) 100 MG tablet Take 1 tablet (100 mg total) by mouth daily. 90 tablet 3   sertraline (ZOLOFT) 50 MG tablet Take 50 mg by mouth daily.     trametinib dimethyl sulfoxide (MEKINIST) 2 MG tablet Take 1 tablet (2 mg total) by mouth daily. Take 1 hour before or 2 hours after a meal. Store refrigerated in original container. 30 tablet 1   No current facility-administered medications on file prior to visit.    Allergies:  Allergies  Allergen Reactions   Rifapentine Other (See Comments)    Flu-like symptoms   Amoxicillin Rash   Clavulanic Acid Rash   Past Medical History:  Past Medical History:  Diagnosis Date   Allergy    Anal fissure    Anxiety    Cataract    Depression    Elevated LFTs    Hyperglycemia    Hyperlipidemia    Hypertension    Osteoarthritis    Pars defect of lumbar spine    Pleomorphic xanthoastrocytoma of temporal lobe (HCC)    Right   Seizures (HCC)    Spondylolisthesis    lumbar   Thrombocytopenia (HCC) 12/31/2022   Past Surgical History:  Past Surgical History:  Procedure Laterality Date   cataract     CRANIOTOMY N/A 10/02/2013   Procedure: 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;  Surgeon: Hewitt Shorts, MD;  Location: [facility];  Service: Neurosurgery;  Laterality: N/A;  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   CRANIOTOMY Right 11/29/2020   Procedure: Right  Parietal craniotomy for tumor resection;  Surgeon: Coletta Memos, MD;  Location: [facility] OR;  Service: Neurosurgery;  Laterality: Right;   HEEL SPUR SURGERY     Social History:  Social History   Socioeconomic History   Marital status: Divorced    Spouse name: separated-no papers file   Number of children: 0   Years of education: Not on file   Highest education level: Not on file  Occupational History   Occupation: RT    Employer: [employer]    Comment: [employer] and [employer]  Tobacco Use   Smoking status: Never   Smokeless tobacco: Never  Substance and Sexual Activity   Alcohol use: Yes    Alcohol/week: 0.0 - 4.0 standard drinks of alcohol  Comment: social   Drug use: No   Sexual activity: Not Currently    Partners: Male  Other Topics Concern   Not on file  Social History Narrative   Separated from second husband.  Very contentious split.   Social Drivers of Corporate investment banker Strain: Low Risk  (01/15/2022)   Overall Financial Resource Strain (CARDIA)    Difficulty of Paying Living Expenses: Not hard at all  Food Insecurity: Patient Unable To Answer (12/31/2022)   Hunger Vital Sign    Worried About Programme researcher, broadcasting/film/video in the Last Year: Patient unable to answer    Ran Out of Food in the Last Year: Patient unable to answer  Transportation Needs: Patient Unable To Answer (12/31/2022)   PRAPARE - Transportation    Lack of Transportation (Medical): Patient unable to answer    Lack of Transportation (Non-Medical): Patient unable to answer  Physical Activity: Inactive (02/03/2021)   [facility], [facility]   Exercise Vital Sign    Days of Exercise per Week: 0 days    Minutes of Exercise per Session: 0 min  Stress: Stress Concern Present (02/03/2021)   Received from [facility], [facility] of Occupational Health - Occupational Stress Questionnaire    Feeling of Stress : To some  extent  Social Connections: Unknown (06/16/2021)   Received from [facility], [facility]   Social Network    Social Network: Not on file  Intimate Partner Violence: Patient Unable To Answer (12/31/2022)   Humiliation, Afraid, Rape, and Kick questionnaire    Fear of Current or Ex-Partner: Patient unable to answer    Emotionally Abused: Patient unable to answer    Physically Abused: Patient unable to answer    Sexually Abused: Patient unable to answer   Family History:  Family History  Problem Relation Age of Onset   Mental illness Father        depression   COPD Father    Alcohol abuse Brother    Breast cancer Neg Hx     Review of Systems: Constitutional: Doesn't report fevers, chills or abnormal weight loss Eyes: Doesn't report blurriness of vision Ears, nose, mouth, throat, and face: Doesn't report sore throat Respiratory: +dyspnea Cardiovascular: Doesn't report palpitation, chest discomfort  Gastrointestinal:  Doesn't report nausea, constipation, diarrhea GU: Doesn't report incontinence Skin: Doesn't report skin rashes Neurological: Per HPI Musculoskeletal: Doesn't report joint pain Behavioral/Psych: Doesn't report anxiety  Physical Exam: Vitals:   03/16/23 1428  BP: 120/76  Pulse: 96  Resp: 18  Temp: 97.7 F (36.5 C)  SpO2: 98%   KPS: 60. General: laying down, eyes closed Head: Normal EENT: No conjunctival injection or scleral icterus.  Lungs: Normal Cardiac: Regular rate Abdomen: Non-distended abdomen Skin: +purpura, arms and legs Extremities: +edema  Neurologic Exam: Mental Status: Drowsy, alert, attentive to examiner with tactile stimulation. Oriented to self and environment when attentive. Language is fluent with intact comprehension.  Displays some disorganized thinking. Age advanced psychomotor slowing. Cranial Nerves: Visual acuity is grossly normal. Visual fields are full. Extra-ocular movements intact. No ptosis. Face is symmetric Motor: Tone  and bulk are normal. Power is 2/5 in left arm, 4/5 in left leg. Reflexes are symmetric, no pathologic reflexes present.  Sensory: Intact to light touch Gait: Non ambulatory   Labs: I have reviewed the data as listed    Component Value Date/Time   NA 137 03/02/2023 1211   NA 142 06/30/2017 1815   K  3.6 03/02/2023 1211   CL 104 03/02/2023 1211   CO2 27 03/02/2023 1211   GLUCOSE 96 03/02/2023 1211   BUN 11 03/02/2023 1211   BUN 14 06/30/2017 1815   CREATININE 0.46 03/02/2023 1211   CREATININE 0.72 06/07/2015 0825   CALCIUM 9.3 03/02/2023 1211   PROT 6.1 (L) 03/02/2023 1211   PROT 7.0 06/30/2017 1815   ALBUMIN 3.9 03/02/2023 1211   ALBUMIN 4.6 06/30/2017 1815   AST 22 03/02/2023 1211   ALT 15 03/02/2023 1211   ALKPHOS 75 03/02/2023 1211   BILITOT 0.6 03/02/2023 1211   GFRNONAA >60 03/02/2023 1211   GFRAA  01/18/2022 0405    UNSATISFACTORY SPECIMEN. CLIENT REQUESTS SPECIMEN TO BE ANALYZED.   Lab Results  Component Value Date   WBC 7.0 03/02/2023   NEUTROABS 4.6 03/02/2023   HGB 14.3 03/02/2023   HCT 42.0 03/02/2023   MCV 89.4 03/02/2023   PLT 194 03/02/2023     Assessment/Plan Pleomorphic xanthoastrocytoma of brain [facility])  Seizure disorder (HCC)  Radiation therapy induced brain necrosis  Lindsey Daniels remains with disabling encephalopathy from combination of tumor progression (from November 14 MRI), refractory inflammation/radiation necrosis (despite 4mg  decadron), 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  Dabrafenib (11/25) and Trametinib (12/2) were only recently initiated following approval process.  Today she has weaned off of decadron succesfully.  She is cleared for cycle #3 avastin today.   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  She previously tolerated and responded well to avastin  (2023) -Repeat brain MRI after 4 cycles -Con't physical therapy at SNF -Con't Keppra 1500mg  BID, Lamictal 150mg  BID   We ask that Lindsey Daniels return to clinic in 2 weeks for cycle #4 avastin, or sooner if needed.  Brain MRI will be scheduled for 04/08/23.  All questions were answered. The patient knows to call the clinic with any problems, questions or concerns. No barriers to learning were detected.  The total time spent in the encounter was 30 minutes and more than 50% was on counseling and review of test results   Henreitta Leber, MD Medical Director of Neuro-Oncology [facility] 03/16/23 2:12 PM

## 2023-03-16 NOTE — Progress Notes (Signed)
Nutrition follow-up scheduled during infusion for right temporal  pleomorphic xanthoastrocytoma. Patient is followed by Dr. Barbaraann Cao.  She is receiving Bevacizumab. She lives in [place].  She is in a wheelchair.  Past medical history includes hyperglycemia, hyperlipidemia, hypertension, and seizures.  Medications include Decadron, Keppra, multivitamin, Prilosec, Zofran, and K-Chlor.  Labs on January 28: Reviewed.  Height: 63 inches. Weight: Patient unable to stand to be weighed today.   Last weight documented was 122 pounds 8 ounces on January 14. Patient weighed 152 pounds in May 2024.  Spoke with patient during infusion.  She reports her appetite is not great but she tries to eat everything on her plate.  She was unable to stand up to be weighed today but she thinks she has gained weight.  She has some slight nausea but no vomiting.  Describes occasional constipation.  Denies nutrition questions at this time. Patient has lost 20% body weight in less than 1 year.  Nutrition diagnosis: Unintended weight loss related to brain cancer and associated treatments as evidenced by 20% weight loss in less than 1 year.  Intervention: Educated to try to eat smaller meals and request snacks between meals if desired.  Request nausea medication or stool softener if needed. Strive for   Monitoring, evaluation, goals: Consume adequate calories and protein to minimize weight loss and improve quality of life.  Next visit scheduled with upcoming treatment as needed.

## 2023-03-16 NOTE — Patient Instructions (Signed)
[facility] - A DEPT OF [facility]  Discharge Instructions: Thank you for choosing [facility] to provide your oncology and hematology care.   If you have a lab appointment with the Cancer Center, please go directly to the Cancer Center and check in at the registration area.   Wear comfortable clothing and clothing appropriate for easy access to any Portacath or PICC line.   We strive to give you quality time with your provider. You may need to reschedule your appointment if you arrive late (15 or more minutes).  Arriving late affects you and other patients whose appointments are after yours.  Also, if you miss three or more appointments without notifying the office, you may be dismissed from the clinic at the provider's discretion.      For prescription refill requests, have your pharmacy contact our office and allow 72 hours for refills to be completed.    Today you received the following chemotherapy and/or immunotherapy agent: Bevacizumab (Mvasi)      To help prevent nausea and vomiting after your treatment, we encourage you to take your nausea medication as directed.  BELOW ARE SYMPTOMS THAT SHOULD BE REPORTED IMMEDIATELY: *FEVER GREATER THAN 100.4 F (38 C) OR HIGHER *CHILLS OR SWEATING *NAUSEA AND VOMITING THAT IS NOT CONTROLLED WITH YOUR NAUSEA MEDICATION *UNUSUAL SHORTNESS OF BREATH *UNUSUAL BRUISING OR BLEEDING *URINARY PROBLEMS (pain or burning when urinating, or frequent urination) *BOWEL PROBLEMS (unusual diarrhea, constipation, pain near the anus) TENDERNESS IN MOUTH AND THROAT WITH OR WITHOUT PRESENCE OF ULCERS (sore throat, sores in mouth, or a toothache) UNUSUAL RASH, SWELLING OR PAIN  UNUSUAL VAGINAL DISCHARGE OR ITCHING   Items with * indicate a potential emergency and should be followed up as soon as possible or go to the Emergency Department if any problems should occur.  Please show the CHEMOTHERAPY ALERT CARD or  IMMUNOTHERAPY ALERT CARD at check-in to the Emergency Department and triage nurse.  Should you have questions after your visit or need to cancel or reschedule your appointment, please contact [facility] - A DEPT OF [facility]  Dept: [phone]  and follow the prompts.  Office hours are 8:00 a.m. to 4:30 p.m. Monday - Friday. Please note that voicemails left after 4:00 p.m. may not be returned until the following business day.  We are closed weekends and major holidays. You have access to a nurse at all times for urgent questions. Please call the main number to the clinic Dept: [phone] and follow the prompts.   For any non-urgent questions, you may also contact your provider using MyChart. We now offer e-Visits for anyone 42 and older to request care online for non-urgent symptoms. For details visit [email or web address].   Also download the MyChart app! Go to the app store, search "MyChart", open the app, select [place], and log in with your MyChart username and password.  Bevacizumab Injection What is this medication? BEVACIZUMAB (be va SIZ yoo mab) treats some types of cancer. It works by blocking a protein that causes cancer cells to grow and multiply. This helps to slow or stop the spread of cancer cells. It is a monoclonal antibody. This medicine may be used for other purposes; ask your health care provider or pharmacist if you have questions. COMMON BRAND NAME(S): Alymsys, Avastin, MVASI, Rosaland Lao What should I tell my care team before I take this medication? They need to know if  you have any of these conditions: Blood clots Coughing up blood Having or recent surgery Heart failure High blood pressure History of a connection between 2 or more body parts that do not usually connect (fistula) History of a tear in your stomach or intestines Protein in your urine An unusual or allergic reaction to bevacizumab, other medications, foods,  dyes, or preservatives Pregnant or trying to get pregnant Breast-feeding How should I use this medication? This medication is injected into a vein. It is given by your care team in a hospital or clinic setting. Talk to your care team the use of this medication in children. Special care may be needed. Overdosage: If you think you have taken too much of this medicine contact a poison control center or emergency room at once. NOTE: This medicine is only for you. Do not share this medicine with others. What if I miss a dose? Keep appointments for follow-up doses. It is important not to miss your dose. Call your care team if you are unable to keep an appointment. What may interact with this medication? Interactions are not expected. This list may not describe all possible interactions. Give your health care provider a list of all the medicines, herbs, non-prescription drugs, or dietary supplements you use. Also tell them if you smoke, drink alcohol, or use illegal drugs. Some items may interact with your medicine. What should I watch for while using this medication? Your condition will be monitored carefully while you are receiving this medication. You may need blood work while taking this medication. This medication may make you feel generally unwell. This is not uncommon as chemotherapy can affect healthy cells as well as cancer cells. Report any side effects. Continue your course of treatment even though you feel ill unless your care team tells you to stop. This medication may increase your risk to bruise or bleed. Call your care team if you notice any unusual bleeding. Before having surgery, talk to your care team to make sure it is ok. This medication can increase the risk of poor healing of your surgical site or wound. You will need to stop this medication for 28 days before surgery. After surgery, wait at least 28 days before restarting this medication. Make sure the surgical site or wound is  healed enough before restarting this medication. Talk to your care team if questions. Talk to your care team if you may be pregnant. Serious birth defects can occur if you take this medication during pregnancy and for 6 months after the last dose. Contraception is recommended while taking this medication and for 6 months after the last dose. Your care team can help you find the option that works for you. Do not breastfeed while taking this medication and for 6 months after the last dose. This medication can cause infertility. Talk to your care team if you are concerned about your fertility. What side effects may I notice from receiving this medication? Side effects that you should report to your care team as soon as possible: Allergic reactions--skin rash, itching, hives, swelling of the face, lips, tongue, or throat Bleeding--bloody or black, tar-like stools, vomiting blood or brown material that looks like coffee grounds, red or dark brown urine, small red or purple spots on skin, unusual bruising or bleeding Blood clot--pain, swelling, or warmth in the leg, shortness of breath, chest pain Heart attack--pain or tightness in the chest, shoulders, arms, or jaw, nausea, shortness of breath, cold or clammy skin, feeling faint or lightheaded Heart  failure--shortness of breath, swelling of the ankles, feet, or hands, sudden weight gain, unusual weakness or fatigue Increase in blood pressure Infection--fever, chills, cough, sore throat, wounds that don't heal, pain or trouble when passing urine, general feeling of discomfort or being unwell Infusion reactions--chest pain, shortness of breath or trouble breathing, feeling faint or lightheaded Kidney injury--decrease in the amount of urine, swelling of the ankles, hands, or feet Stomach pain that is severe, does not go away, or gets worse Stroke--sudden numbness or weakness of the face, arm, or leg, trouble speaking, confusion, trouble walking, loss of  balance or coordination, dizziness, severe headache, change in vision Sudden and severe headache, confusion, change in vision, seizures, which may be signs of posterior reversible encephalopathy syndrome (PRES) Side effects that usually do not require medical attention (report to your care team if they continue or are bothersome): Back pain Change in taste Diarrhea Dry skin Increased tears Nosebleed This list may not describe all possible side effects. Call your doctor for medical advice about side effects. You may report side effects to FDA at 1-800-FDA-1088. Where should I keep my medication? This medication is given in a hospital or clinic. It will not be stored at home. NOTE: This sheet is a summary. It may not cover all possible information. If you have questions about this medicine, talk to your doctor, pharmacist, or health care provider.  2024 Elsevier/Gold Standard (2021-06-20 00:00:00)

## 2023-03-17 ENCOUNTER — Encounter: Payer: Self-pay | Admitting: Internal Medicine

## 2023-03-19 ENCOUNTER — Other Ambulatory Visit: Payer: Self-pay

## 2023-03-19 ENCOUNTER — Telehealth: Payer: Self-pay

## 2023-03-19 NOTE — Telephone Encounter (Signed)
Eber Jones called from [place] [phone]) regarding coordination of pt's MRI Dr. Barbaraann Cao ordered for pt for February.  Eber Jones stated it would be easier if the pt could have her MRI done there in [place] at the [facility].  Eber Jones stated the [facility] in [place] will officially open on 04/02/2023.  Eber Jones stated if this arrangement will be difficult to arrange, they are willing to bring the pt here to [facility] for the MRI since they will be in this area anyway around 04/08/2023 & 04/09/2023.  Eber Jones also requested if the pt's appts could be scheduled for 04/12/2023 so she could start making travel arrangements w/in their facility for this patient.  Stated this nurse will make Dr. Barbaraann Cao and his Team aware of Carolyn's call and request.

## 2023-03-23 ENCOUNTER — Other Ambulatory Visit: Payer: Self-pay

## 2023-03-23 ENCOUNTER — Telehealth: Payer: Self-pay | Admitting: *Deleted

## 2023-03-23 NOTE — Telephone Encounter (Signed)
 Returned PC to [place] with [facility] per request, no answer, left VM - informed Elveria that the [facility] in [place] is only doing scans without contrast.  The patient's MRI is W/WO contrast, so current appointment at [facility] needs to remain.  Informed Elveria our scheduling department will contact her to schedule F/U appointment with Dr Buckley, also lab & infusion appointments for this patient.  Instructed to Elveria to contact this office with any further concerns.  Scheduling message sent.

## 2023-03-29 ENCOUNTER — Inpatient Hospital Stay: Payer: Medicare Other | Admitting: Internal Medicine

## 2023-03-29 ENCOUNTER — Inpatient Hospital Stay: Payer: Medicare Other

## 2023-03-29 ENCOUNTER — Inpatient Hospital Stay: Payer: Medicare Other | Attending: Internal Medicine

## 2023-03-29 VITALS — BP 122/68 | HR 81 | Temp 97.7°F | Resp 15

## 2023-03-29 DIAGNOSIS — C719 Malignant neoplasm of brain, unspecified: Secondary | ICD-10-CM

## 2023-03-29 DIAGNOSIS — Y842 Radiological procedure and radiotherapy as the cause of abnormal reaction of the patient, or of later complication, without mention of misadventure at the time of the procedure: Secondary | ICD-10-CM | POA: Diagnosis not present

## 2023-03-29 DIAGNOSIS — I6789 Other cerebrovascular disease: Secondary | ICD-10-CM

## 2023-03-29 DIAGNOSIS — Z5112 Encounter for antineoplastic immunotherapy: Secondary | ICD-10-CM | POA: Insufficient documentation

## 2023-03-29 DIAGNOSIS — Z79899 Other long term (current) drug therapy: Secondary | ICD-10-CM | POA: Diagnosis not present

## 2023-03-29 DIAGNOSIS — C712 Malignant neoplasm of temporal lobe: Secondary | ICD-10-CM | POA: Insufficient documentation

## 2023-03-29 LAB — CBC WITH DIFFERENTIAL (CANCER CENTER ONLY)
Abs Immature Granulocytes: 0.04 10*3/uL (ref 0.00–0.07)
Basophils Absolute: 0 10*3/uL (ref 0.0–0.1)
Basophils Relative: 1 %
Eosinophils Absolute: 0.4 10*3/uL (ref 0.0–0.5)
Eosinophils Relative: 6 %
HCT: 42.3 % (ref 36.0–46.0)
Hemoglobin: 14.3 g/dL (ref 12.0–15.0)
Immature Granulocytes: 1 %
Lymphocytes Relative: 26 %
Lymphs Abs: 1.7 10*3/uL (ref 0.7–4.0)
MCH: 31.2 pg (ref 26.0–34.0)
MCHC: 33.8 g/dL (ref 30.0–36.0)
MCV: 92.4 fL (ref 80.0–100.0)
Monocytes Absolute: 0.5 10*3/uL (ref 0.1–1.0)
Monocytes Relative: 8 %
Neutro Abs: 3.9 10*3/uL (ref 1.7–7.7)
Neutrophils Relative %: 58 %
Platelet Count: 246 10*3/uL (ref 150–400)
RBC: 4.58 MIL/uL (ref 3.87–5.11)
RDW: 13.9 % (ref 11.5–15.5)
WBC Count: 6.6 10*3/uL (ref 4.0–10.5)
nRBC: 0 % (ref 0.0–0.2)

## 2023-03-29 LAB — CMP (CANCER CENTER ONLY)
ALT: 11 U/L (ref 0–44)
AST: 18 U/L (ref 15–41)
Albumin: 3.9 g/dL (ref 3.5–5.0)
Alkaline Phosphatase: 73 U/L (ref 38–126)
Anion gap: 7 (ref 5–15)
BUN: 17 mg/dL (ref 8–23)
CO2: 27 mmol/L (ref 22–32)
Calcium: 9.6 mg/dL (ref 8.9–10.3)
Chloride: 104 mmol/L (ref 98–111)
Creatinine: 0.62 mg/dL (ref 0.44–1.00)
GFR, Estimated: >60 mL/min (ref >60)
Glucose, Bld: 98 mg/dL (ref 70–99)
Potassium: 3.9 mmol/L (ref 3.5–5.1)
Sodium: 138 mmol/L (ref 135–145)
Total Bilirubin: 0.5 mg/dL (ref 0.0–1.2)
Total Protein: 6.4 g/dL — ABNORMAL LOW (ref 6.5–8.1)

## 2023-03-29 MED ORDER — DABRAFENIB MESYLATE 75 MG PO CAPS: 150.0000 mg | ORAL_CAPSULE | Freq: Two times a day (BID) | ORAL | 1 refills | Status: DC

## 2023-03-29 MED ORDER — SODIUM CHLORIDE 0.9 % IV SOLN: INTRAVENOUS | Status: DC

## 2023-03-29 MED ORDER — SODIUM CHLORIDE 0.9 % IV SOLN
10.0000 mg/kg | Freq: Once | INTRAVENOUS | Status: AC
  Administered 2023-03-29: 600 mg via INTRAVENOUS
  Filled 2023-03-29: qty 8

## 2023-03-29 NOTE — Patient Instructions (Signed)
 [facility] - A DEPT OF [facility]  Discharge Instructions: Thank you for choosing [facility] to provide your oncology and hematology care.   If you have a lab appointment with the Cancer Center, please go directly to the Cancer Center and check in at the registration area.   Wear comfortable clothing and clothing appropriate for easy access to any Portacath or PICC line.   We strive to give you quality time with your provider. You may need to reschedule your appointment if you arrive late (15 or more minutes).  Arriving late affects you and other patients whose appointments are after yours.  Also, if you miss three or more appointments without notifying the office, you may be dismissed from the clinic at the provider's discretion.      For prescription refill requests, have your pharmacy contact our office and allow 72 hours for refills to be completed.    Today you received the following chemotherapy and/or immunotherapy agents: bevacizumab-awwb      To help prevent nausea and vomiting after your treatment, we encourage you to take your nausea medication as directed.  BELOW ARE SYMPTOMS THAT SHOULD BE REPORTED IMMEDIATELY: *FEVER GREATER THAN 100.4 F (38 C) OR HIGHER *CHILLS OR SWEATING *NAUSEA AND VOMITING THAT IS NOT CONTROLLED WITH YOUR NAUSEA MEDICATION *UNUSUAL SHORTNESS OF BREATH *UNUSUAL BRUISING OR BLEEDING *URINARY PROBLEMS (pain or burning when urinating, or frequent urination) *BOWEL PROBLEMS (unusual diarrhea, constipation, pain near the anus) TENDERNESS IN MOUTH AND THROAT WITH OR WITHOUT PRESENCE OF ULCERS (sore throat, sores in mouth, or a toothache) UNUSUAL RASH, SWELLING OR PAIN  UNUSUAL VAGINAL DISCHARGE OR ITCHING   Items with * indicate a potential emergency and should be followed up as soon as possible or go to the Emergency Department if any problems should occur.  Please show the CHEMOTHERAPY ALERT CARD or  IMMUNOTHERAPY ALERT CARD at check-in to the Emergency Department and triage nurse.  Should you have questions after your visit or need to cancel or reschedule your appointment, please contact [facility] - A DEPT OF [facility]  Dept: [phone]  and follow the prompts.  Office hours are 8:00 a.m. to 4:30 p.m. Monday - Friday. Please note that voicemails left after 4:00 p.m. may not be returned until the following business day.  We are closed weekends and major holidays. You have access to a nurse at all times for urgent questions. Please call the main number to the clinic Dept: [phone] and follow the prompts.   For any non-urgent questions, you may also contact your provider using MyChart. We now offer e-Visits for anyone 12 and older to request care online for non-urgent symptoms. For details visit [email or web address].   Also download the MyChart app! Go to the app store, search "MyChart", open the app, select [place], and log in with your MyChart username and password.

## 2023-03-29 NOTE — Progress Notes (Signed)
 [facility] at [facility] [address]  [place], [place] [address] [phone]   Interval Evaluation  Date of Service: 03/29/23 Patient Name: Lindsey Daniels Patient MRN: [id number] Patient DOB: [birth year] Provider: Mamie Searles, MD  Identifying Statement:  Lindsey Daniels is a 68 y.o. female with right temporal  pleomorphic xanthoastrocytoma    Oncologic History: Oncology History  Pleomorphic xanthoastrocytoma of brain [facility])  09/29/2013 Surgery   Right temporal craniotomy, resection with Dr. Cipriano Creeks.  Path is PXA WHO II.   11/29/2020 Surgery   Disease progression, second craniotomy with Dr. Michale Age, sub-total resection.  Path is unchanged.   02/21/2021 - 04/02/2021 Radiation Therapy   IMRT radiation therapy with Dr. Jeryl Moris   12/16/2021 Treatment Plan Change   Completes 4 cycles of avastin  10mg /kg for severe inflammation, poor tolerance of steroids   12/02/2022 Progression   Progression of Disease   01/11/2023 -  Chemotherapy   Initiates dual BRAF/MEK inhibition with dabrafenib+trametinib    02/15/2023 -  Chemotherapy   Patient is on Treatment Plan : BRAIN GBM Bevacizumab  14d x 6 cycles       Biomarkers:   Interval History: Lindsey Daniels presents today for cycle #3 avastin  infusion.  She continues to reside in the skilled nursing facility.  Overall alertness continues to improve.  Remains non ambulatory due to significant left sided weakness.  No frank seizures that are confirmed by nursing aide at bedside today.  She has started on combined BRAF/MEK inhibitory therapy, start date 11/25 per nursing charts.  Decadron  is now discontinued.  Prior: She describes twitching of the left side which persisted for more than 2 hours.  During admission Keppra  was increased  to 1500mg  twice per day, decadron  restarted 4mg  twice per day.  She remains quite weak on the left side compared to prior, particularly with the arm.  She is walking minimally with  the walker, mostly using the wheelchair.  Very high levels of irritability, mood swings noted at the facility.  She is confused at times, but falls have been avoided.  H+P (01/30/21) Patient presents today after recent re-resection for progressive right temporal PXA.  She initially presented with a seizure in 2015; imaging demonstrated enhancing right temporal lesion which was grossly resected by Dr. Cipriano Creeks.  After last follow up in 2020, she began to experience headaches, ear fullness and some confusion this fall.  Imaging demonstrated marked progression of disease; she went for second surgery on 11/29/20.  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   Medications: Current Outpatient Medications on File Prior to Visit  Medication Sig Dispense Refill   acetaminophen  (TYLENOL ) 650 MG CR tablet Take 650 mg by mouth every 8 (eight) hours as needed for pain.     apixaban  (ELIQUIS ) 5 MG TABS tablet Take 1 tablet (5 mg total) by mouth 2 (two) times daily. 60 tablet 2   atorvastatin  (LIPITOR) 20 MG tablet Take 1 tablet (20 mg total) by mouth daily. 90 tablet 3   cetirizine  (ZYRTEC ) 10 MG tablet Take 10 mg by mouth daily.     fluticasone  (FLONASE ) 50 MCG/ACT nasal spray Place 1-2 sprays into both nostrils daily. (Patient taking differently: Place 2 sprays into both nostrils daily.) 16 g 0   lamoTRIgine  (LAMICTAL ) 150 MG tablet Take 1 tablet (150 mg total) by mouth 2 (two) times daily.     levETIRAcetam  (KEPPRA ) 750 MG tablet  Take 2 tablets (1,500 mg total) by mouth 2 (two) times daily. 120 tablet 1   metoprolol  tartrate (LOPRESSOR ) 25 MG tablet Take 12.5 mg by mouth 2 (two) times daily.     Midazolam  (NAYZILAM ) 5 MG/0.1ML SOLN Place 5 mg into the nose every 6 (six) hours as needed (prolonged seizure, greater than 5 minutes). 1 each 0   Multiple Vitamin (MULTIVITAMIN WITH MINERALS) TABS tablet Take 1 tablet by mouth daily.      omeprazole  (PRILOSEC) 20 MG capsule Take 20 mg by mouth daily.     ondansetron  (ZOFRAN ) 4 MG tablet Take 4 mg by mouth every 6 (six) hours as needed for nausea or vomiting.     potassium chloride  SA (KLOR-CON  M) 20 MEQ tablet Take 1 tablet (20 mEq total) by mouth 2 (two) times daily. 30 tablet 1   sertraline  (ZOLOFT ) 100 MG tablet Take 1 tablet (100 mg total) by mouth daily. 90 tablet 3   sertraline  (ZOLOFT ) 50 MG tablet Take 50 mg by mouth daily.     trametinib  dimethyl sulfoxide  (MEKINIST ) 2 MG tablet Take 1 tablet (2 mg total) by mouth daily. Take 1 hour before or 2 hours after a meal. Store refrigerated in original container. 30 tablet 1   Current Facility-Administered Medications on File Prior to Visit  Medication Dose Route Frequency Provider Last Rate Last Admin   0.9 %  sodium chloride  infusion   Intravenous Continuous Katilynn Sinkler K, MD 10 mL/hr at 03/29/23 1131 New Bag at 03/29/23 1131   bevacizumab -awwb (MVASI ) 600 mg in sodium chloride  0.9 % 100 mL chemo infusion  10 mg/kg (Treatment Plan Recorded) Intravenous Once Aleia Larocca K, MD        Allergies:  Allergies  Allergen Reactions   Rifapentine Other (See Comments)    Flu-like symptoms   Amoxicillin Rash   Clavulanic Acid Rash   Past Medical History:  Past Medical History:  Diagnosis Date   Allergy    Anal fissure    Anxiety    Cataract    Depression    Elevated LFTs    Hyperglycemia    Hyperlipidemia    Hypertension    Osteoarthritis    Pars defect of lumbar spine    Pleomorphic xanthoastrocytoma of temporal lobe (HCC)    Right   Seizures (HCC)    Spondylolisthesis    lumbar   Thrombocytopenia (HCC) 12/31/2022   Past Surgical History:  Past Surgical History:  Procedure Laterality Date   cataract     CRANIOTOMY N/A 10/02/2013   Procedure: 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;  Surgeon: Corrina Dimitri, MD;  Location: [facility];  Service: Neurosurgery;  Laterality: N/A;  Craniotomy for  resection of tumor with stealth   CRANIOTOMY Right 11/29/2020   Procedure: Right Parietal craniotomy for tumor resection;  Surgeon: Audie Bleacher, MD;  Location: [facility];  Service: Neurosurgery;  Laterality: Right;   HEEL SPUR SURGERY     Social History:  Social History   Socioeconomic History   Marital status: Divorced    Spouse name: separated-no papers file   Number of children: 0   Years of education: Not on file   Highest education level: Not on file  Occupational History   Occupation: RT    Employer: [employer] @ [employer]    Comment: [employer] and [employer]  Tobacco Use   Smoking status: Never   Smokeless tobacco: Never  Substance and Sexual Activity   Alcohol use:  Yes    Alcohol/week: 0.0 - 4.0 standard drinks of alcohol    Comment: social   Drug use: No   Sexual activity: Not Currently    Partners: Male  Other Topics Concern   Not on file  Social History Narrative   Separated from second husband.  Very contentious split.   Social Drivers of Corporate investment banker Strain: Low Risk  (01/15/2022)   Overall Financial Resource Strain (CARDIA)    Difficulty of Paying Living Expenses: Not hard at all  Food Insecurity: Patient Unable To Answer (12/31/2022)   Hunger Vital Sign    Worried About Programme researcher, broadcasting/film/video in the Last Year: Patient unable to answer    Ran Out of Food in the Last Year: Patient unable to answer  Transportation Needs: Patient Unable To Answer (12/31/2022)   PRAPARE - Transportation    Lack of Transportation (Medical): Patient unable to answer    Lack of Transportation (Non-Medical): Patient unable to answer  Physical Activity: Inactive (02/03/2021)   Received from [facility]   Exercise Vital Sign    Days of Exercise per Week: 0 days    Minutes of Exercise per Session: 0 min  Stress: Stress Concern Present (02/03/2021)   Received from [facility]  Occupational Health - Occupational Stress Questionnaire    Feeling of Stress : To some extent  Social Connections: Unknown (06/16/2021)   Received from [facility]   Social Network    Social Network: Not on file  Intimate Partner Violence: Patient Unable To Answer (12/31/2022)   Humiliation, Afraid, Rape, and Kick questionnaire    Fear of Current or Ex-Partner: Patient unable to answer    Emotionally Abused: Patient unable to answer    Physically Abused: Patient unable to answer    Sexually Abused: Patient unable to answer   Family History:  Family History  Problem Relation Age of Onset   Mental illness Father        depression   COPD Father    Alcohol abuse Brother    Breast cancer Neg Hx     Review of Systems: Constitutional: Doesn't report fevers, chills or abnormal weight loss Eyes: Doesn't report blurriness of vision Ears, nose, mouth, throat, and face: Doesn't report sore throat Respiratory: +dyspnea Cardiovascular: Doesn't report palpitation, chest discomfort  Gastrointestinal:  Doesn't report nausea, constipation, diarrhea GU: Doesn't report incontinence Skin: Doesn't report skin rashes Neurological: Per HPI Musculoskeletal: Doesn't report joint pain Behavioral/Psych: Doesn't report anxiety  Physical Exam: Vitals:   03/29/23 1015  BP: 122/68  Pulse: 81  Resp: 15  Temp: 97.7 F (36.5 C)  SpO2: 97%   KPS: 60. General: laying down, eyes closed Head: Normal EENT: No conjunctival injection or scleral icterus.  Lungs: Normal Cardiac: Regular rate Abdomen: Non-distended abdomen Skin: +purpura, arms and legs Extremities: +edema  Neurologic Exam: Mental Status: Drowsy, alert, attentive to examiner with tactile stimulation. Oriented to self and environment when attentive. Language is fluent with intact comprehension.  Displays some disorganized thinking. Age advanced psychomotor slowing. Cranial Nerves: Visual acuity is grossly normal. Visual  fields are full. Extra-ocular movements intact. No ptosis. Face is symmetric Motor: Tone and bulk are normal. Power is 2/5 in left arm, 4/5 in left leg. Reflexes are symmetric, no pathologic reflexes present.  Sensory: Intact to light touch Gait: Non ambulatory   Labs: I have reviewed the data as listed    Component Value Date/Time  NA 138 03/29/2023 0947   NA 142 06/30/2017 1815   K 3.9 03/29/2023 0947   CL 104 03/29/2023 0947   CO2 27 03/29/2023 0947   GLUCOSE 98 03/29/2023 0947   BUN 17 03/29/2023 0947   BUN 14 06/30/2017 1815   CREATININE 0.62 03/29/2023 0947   CREATININE 0.72 06/07/2015 0825   CALCIUM  9.6 03/29/2023 0947   PROT 6.4 (L) 03/29/2023 0947   PROT 7.0 06/30/2017 1815   ALBUMIN 3.9 03/29/2023 0947   ALBUMIN 4.6 06/30/2017 1815   AST 18 03/29/2023 0947   ALT 11 03/29/2023 0947   ALKPHOS 73 03/29/2023 0947   BILITOT 0.5 03/29/2023 0947   GFRNONAA >60 03/29/2023 0947   GFRAA  01/18/2022 0405    UNSATISFACTORY SPECIMEN. CLIENT REQUESTS SPECIMEN TO BE ANALYZED.   Lab Results  Component Value Date   WBC 6.6 03/29/2023   NEUTROABS 3.9 03/29/2023   HGB 14.3 03/29/2023   HCT 42.3 03/29/2023   MCV 92.4 03/29/2023   PLT 246 03/29/2023     Assessment/Plan Radiation therapy induced brain necrosis - Plan: Infusion Appointment Request, Clinic Appointment Request, Lab Appointment Request, CBC with Differential (Cancer Center Only)  Pleomorphic xanthoastrocytoma of brain [facility]) - Plan: Infusion Appointment Request, Clinic Appointment Request, Lab Appointment Request, CBC with Differential (Cancer Center Only)  Pleomorphic xanthoastrocytoma (HCC) - Plan: dabrafenib mesylate  (TAFINLAR ) 75 MG capsule  Gila Homstad is clinically stable today.  Labs are within normal limits.  She is cleared for cycle #4 avastin  today.   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  She previously tolerated and responded well to avastin  (2023) -Repeat brain MRI after 4 cycles -Con't physical therapy at SNF -Con't Keppra  1500mg  BID, Lamictal  150mg  BID   We ask that Laraine Plate Noyola return to clinic in 2 weeks following MRI brain for cycle #5 avastin , or sooner if needed.     All questions were answered. The patient knows to call the clinic with any problems, questions or concerns. No barriers to learning were detected.  The total time spent in the encounter was 30 minutes and more than 50% was on counseling and review of test results   Mamie Searles, MD Medical Director of Neuro-Oncology [facility] 03/29/23 11:32 AM

## 2023-03-30 ENCOUNTER — Other Ambulatory Visit: Payer: Self-pay

## 2023-04-02 ENCOUNTER — Other Ambulatory Visit: Payer: Self-pay

## 2023-04-02 ENCOUNTER — Other Ambulatory Visit (HOSPITAL_COMMUNITY): Payer: Self-pay

## 2023-04-06 ENCOUNTER — Telehealth: Payer: Self-pay

## 2023-04-06 NOTE — Telephone Encounter (Signed)
This pt is seen @ [facility] by Dr Barbaraann Cao. [phone]

## 2023-04-07 ENCOUNTER — Telehealth: Payer: Self-pay

## 2023-04-07 NOTE — Telephone Encounter (Addendum)
TC from [place] at [facility], stating that pt has not had her trametinib for approximately 2 weeks due to problems getting her Rx delivered to their facility. She states that [facility] will not change the pt's address unless the healthcare provider's office calls them. TC to [employer], spoke w/ [employer]. Was able to get shipping address changed to the facility for trametinib Rx; supposed to arrive on 2/21. Elease Hashimoto at [facility] and [facility] made aware and is also aware that refills will need to be initiated by patient/facility.

## 2023-04-08 ENCOUNTER — Ambulatory Visit (HOSPITAL_COMMUNITY)
Admission: RE | Admit: 2023-04-08 | Discharge: 2023-04-08 | Disposition: A | Discharge status: Home / Self Care | Payer: Medicare Other | Source: Ambulatory Visit | Attending: Internal Medicine | Admitting: Internal Medicine

## 2023-04-08 DIAGNOSIS — I6789 Other cerebrovascular disease: Secondary | ICD-10-CM | POA: Diagnosis present

## 2023-04-08 DIAGNOSIS — C719 Malignant neoplasm of brain, unspecified: Secondary | ICD-10-CM | POA: Diagnosis not present

## 2023-04-08 DIAGNOSIS — Y842 Radiological procedure and radiotherapy as the cause of abnormal reaction of the patient, or of later complication, without mention of misadventure at the time of the procedure: Secondary | ICD-10-CM | POA: Insufficient documentation

## 2023-04-08 MED ORDER — GADOBUTROL 1 MMOL/ML IV SOLN
5.0000 mL | Freq: Once | INTRAVENOUS | Status: AC | PRN
  Administered 2023-04-08: 5 mL via INTRAVENOUS

## 2023-04-09 ENCOUNTER — Other Ambulatory Visit: Payer: Self-pay

## 2023-04-13 ENCOUNTER — Telehealth: Payer: Self-pay | Admitting: Internal Medicine

## 2023-04-13 ENCOUNTER — Inpatient Hospital Stay: Payer: Medicare Other

## 2023-04-13 ENCOUNTER — Inpatient Hospital Stay (HOSPITAL_BASED_OUTPATIENT_CLINIC_OR_DEPARTMENT_OTHER): Payer: Medicare Other | Admitting: Internal Medicine

## 2023-04-13 VITALS — BP 127/74 | HR 72 | Temp 97.7°F | Resp 18

## 2023-04-13 VITALS — BP 125/66 | HR 73 | Temp 97.9°F | Resp 18

## 2023-04-13 DIAGNOSIS — C719 Malignant neoplasm of brain, unspecified: Secondary | ICD-10-CM

## 2023-04-13 DIAGNOSIS — Y842 Radiological procedure and radiotherapy as the cause of abnormal reaction of the patient, or of later complication, without mention of misadventure at the time of the procedure: Secondary | ICD-10-CM

## 2023-04-13 DIAGNOSIS — I6789 Other cerebrovascular disease: Secondary | ICD-10-CM | POA: Diagnosis not present

## 2023-04-13 DIAGNOSIS — G40909 Epilepsy, unspecified, not intractable, without status epilepticus: Secondary | ICD-10-CM

## 2023-04-13 DIAGNOSIS — Z5112 Encounter for antineoplastic immunotherapy: Secondary | ICD-10-CM | POA: Diagnosis not present

## 2023-04-13 LAB — CBC WITH DIFFERENTIAL (CANCER CENTER ONLY)
Abs Immature Granulocytes: 0.03 10*3/uL (ref 0.00–0.07)
Basophils Absolute: 0 10*3/uL (ref 0.0–0.1)
Basophils Relative: 1 %
Eosinophils Absolute: 0.4 10*3/uL (ref 0.0–0.5)
Eosinophils Relative: 7 %
HCT: 44.7 % (ref 36.0–46.0)
Hemoglobin: 15.1 g/dL — ABNORMAL HIGH (ref 12.0–15.0)
Immature Granulocytes: 1 %
Lymphocytes Relative: 32 %
Lymphs Abs: 1.8 10*3/uL (ref 0.7–4.0)
MCH: 30.7 pg (ref 26.0–34.0)
MCHC: 33.8 g/dL (ref 30.0–36.0)
MCV: 90.9 fL (ref 80.0–100.0)
Monocytes Absolute: 0.7 10*3/uL (ref 0.1–1.0)
Monocytes Relative: 12 %
Neutro Abs: 2.7 10*3/uL (ref 1.7–7.7)
Neutrophils Relative %: 47 %
Platelet Count: 346 10*3/uL (ref 150–400)
RBC: 4.92 MIL/uL (ref 3.87–5.11)
RDW: 13.3 % (ref 11.5–15.5)
WBC Count: 5.6 10*3/uL (ref 4.0–10.5)
nRBC: 0 % (ref 0.0–0.2)

## 2023-04-13 LAB — CMP (CANCER CENTER ONLY)
ALT: 7 U/L (ref 0–44)
AST: 17 U/L (ref 15–41)
Albumin: 4 g/dL (ref 3.5–5.0)
Alkaline Phosphatase: 83 U/L (ref 38–126)
Anion gap: 8 (ref 5–15)
BUN: 10 mg/dL (ref 8–23)
CO2: 27 mmol/L (ref 22–32)
Calcium: 9.9 mg/dL (ref 8.9–10.3)
Chloride: 104 mmol/L (ref 98–111)
Creatinine: 0.6 mg/dL (ref 0.44–1.00)
GFR, Estimated: >60 mL/min (ref >60)
Glucose, Bld: 110 mg/dL — ABNORMAL HIGH (ref 70–99)
Potassium: 3.9 mmol/L (ref 3.5–5.1)
Sodium: 139 mmol/L (ref 135–145)
Total Bilirubin: 0.6 mg/dL (ref 0.0–1.2)
Total Protein: 6.9 g/dL (ref 6.5–8.1)

## 2023-04-13 MED ORDER — SODIUM CHLORIDE 0.9 % IV SOLN
10.0000 mg/kg | Freq: Once | INTRAVENOUS | Status: AC
  Administered 2023-04-13: 600 mg via INTRAVENOUS
  Filled 2023-04-13: qty 16

## 2023-04-13 MED ORDER — SODIUM CHLORIDE 0.9 % IV SOLN: INTRAVENOUS | Status: DC

## 2023-04-13 NOTE — Progress Notes (Signed)
 [facility] at [facility] [address]  [place], [place] [address] [phone]   Interval Evaluation  Date of Service: 04/13/23 Patient Name: Lindsey Daniels Patient MRN: [id number] Patient DOB: 12-30-1955 Provider: Henreitta Leber, MD  Identifying Statement:  Lindsey Daniels is a 68 y.o. female with right temporal  pleomorphic xanthoastrocytoma    Oncologic History: Oncology History  Pleomorphic xanthoastrocytoma of brain [facility])  09/29/2013 Surgery   Right temporal craniotomy, resection with Dr. Newell Coral.  Path is PXA WHO II.   11/29/2020 Surgery   Disease progression, second craniotomy with Dr. Franky Macho, sub-total resection.  Path is unchanged.   02/21/2021 - 04/02/2021 Radiation Therapy   IMRT radiation therapy with Dr. Mitzi Hansen   12/16/2021 Treatment Plan Change   Completes 4 cycles of avastin 10mg /kg for severe inflammation, poor tolerance of steroids   12/02/2022 Progression   Progression of Disease   01/11/2023 -  Chemotherapy   Initiates dual BRAF/MEK inhibition with dabrafenib+trametinib   02/15/2023 -  Chemotherapy   Patient is on Treatment Plan : BRAIN GBM Bevacizumab 14d x 6 cycles       Biomarkers:   Interval History: Lindsey Daniels presents today for avastin infusion, MRI review.  She continues to reside in the skilled nursing facility.  Mental status is stable from prior.  Remains non ambulatory due to significant left sided weakness.  No seizures in several months now.  She continues on combined BRAF/MEK inhibitory therapy, originally initaited 11/25.  She does complain of some pain in her left shoulder, hoarseness.  Prior: She describes twitching of the left side which persisted for more than 2 hours.  During admission Keppra was increased  to 1500mg  twice per day, decadron restarted 4mg  twice per day.  She remains quite weak on the left side compared to prior, particularly with the arm.  She is walking minimally with the walker,  mostly using the wheelchair.  Very high levels of irritability, mood swings noted at the facility.  She is confused at times, but falls have been avoided.  H+P (01/30/21) Patient presents today after recent re-resection for progressive right temporal PXA.  She initially presented with a seizure in 2015; imaging demonstrated enhancing right temporal lesion which was grossly resected by Dr. Newell Coral.  After last follow up in 2020, she began to experience headaches, ear fullness and some confusion this fall.  Imaging demonstrated marked progression of disease; she went for second surgery on 11/29/20.  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   Medications: Current Outpatient Medications on File Prior to Visit  Medication Sig Dispense Refill   acetaminophen (TYLENOL) 650 MG CR tablet Take 650 mg by mouth every 8 (eight) hours as needed for pain.     apixaban (ELIQUIS) 5 MG TABS tablet Take 1 tablet (5 mg total) by mouth 2 (two) times daily. 60 tablet 2   atorvastatin (LIPITOR) 20 MG tablet Take 1 tablet (20 mg total) by mouth daily. 90 tablet 3   cetirizine (ZYRTEC) 10 MG tablet Take 10 mg by mouth daily.     dabrafenib mesylate (TAFINLAR) 75 MG capsule Take 2 capsules (150 mg total) by mouth 2 (two) times daily. Take on an empty stomach 1 hour before or 2 hours after meals. 120 capsule 1   fluticasone (FLONASE) 50 MCG/ACT nasal spray Place 1-2 sprays into both nostrils daily. (Patient taking differently: Place 2 sprays into  both nostrils daily.) 16 g 0   lamoTRIgine (LAMICTAL) 150 MG tablet Take 1 tablet (150 mg total) by mouth 2 (two) times daily.     levETIRAcetam (KEPPRA) 750 MG tablet Take 2 tablets (1,500 mg total) by mouth 2 (two) times daily. 120 tablet 1   metoprolol tartrate (LOPRESSOR) 25 MG tablet Take 12.5 mg by mouth 2 (two) times daily.     Midazolam (NAYZILAM) 5 MG/0.1ML SOLN Place 5 mg into the nose every 6  (six) hours as needed (prolonged seizure, greater than 5 minutes). 1 each 0   Multiple Vitamin (MULTIVITAMIN WITH MINERALS) TABS tablet Take 1 tablet by mouth daily.     omeprazole (PRILOSEC) 20 MG capsule Take 20 mg by mouth daily.     ondansetron (ZOFRAN) 4 MG tablet Take 4 mg by mouth every 6 (six) hours as needed for nausea or vomiting.     potassium chloride SA (KLOR-CON M) 20 MEQ tablet Take 1 tablet (20 mEq total) by mouth 2 (two) times daily. 30 tablet 1   sertraline (ZOLOFT) 100 MG tablet Take 1 tablet (100 mg total) by mouth daily. 90 tablet 3   sertraline (ZOLOFT) 50 MG tablet Take 50 mg by mouth daily.     trametinib dimethyl sulfoxide (MEKINIST) 2 MG tablet Take 1 tablet (2 mg total) by mouth daily. Take 1 hour before or 2 hours after a meal. Store refrigerated in original container. 30 tablet 1   No current facility-administered medications on file prior to visit.    Allergies:  Allergies  Allergen Reactions   Rifapentine Other (See Comments)    Flu-like symptoms   Amoxicillin Rash   Clavulanic Acid Rash   Past Medical History:  Past Medical History:  Diagnosis Date   Allergy    Anal fissure    Anxiety    Cataract    Depression    Elevated LFTs    Hyperglycemia    Hyperlipidemia    Hypertension    Osteoarthritis    Pars defect of lumbar spine    Pleomorphic xanthoastrocytoma of temporal lobe (HCC)    Right   Seizures (HCC)    Spondylolisthesis    lumbar   Thrombocytopenia (HCC) 12/31/2022   Past Surgical History:  Past Surgical History:  Procedure Laterality Date   cataract     CRANIOTOMY N/A 10/02/2013   Procedure: 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;  Surgeon: Hewitt Shorts, MD;  Location: [facility];  Service: Neurosurgery;  Laterality: N/A;  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   CRANIOTOMY Right 11/29/2020   Procedure: Right Parietal craniotomy for tumor resection;  Surgeon: Coletta Memos, MD;  Location: [facility] OR;  Service:  Neurosurgery;  Laterality: Right;   HEEL SPUR SURGERY     Social History:  Social History   Socioeconomic History   Marital status: Divorced    Spouse name: separated-no papers file   Number of children: 0   Years of education: Not on file   Highest education level: Not on file  Occupational History   Occupation: RT    Employer: [employer]    Comment: [employer] and [employer]  Tobacco Use   Smoking status: Never   Smokeless tobacco: Never  Substance and Sexual Activity   Alcohol use: Yes    Alcohol/week: 0.0 - 4.0 standard drinks of alcohol    Comment: social   Drug use: No   Sexual activity: Not Currently    Partners:  Male  Other Topics Concern   Not on file  Social History Narrative   Separated from second husband.  Very contentious split.   Social Drivers of Corporate investment banker Strain: Low Risk  (01/15/2022)   Overall Financial Resource Strain (CARDIA)    Difficulty of Paying Living Expenses: Not hard at all  Food Insecurity: Patient Unable To Answer (12/31/2022)   Hunger Vital Sign    Worried About Programme researcher, broadcasting/film/video in the Last Year: Patient unable to answer    Ran Out of Food in the Last Year: Patient unable to answer  Transportation Needs: Patient Unable To Answer (12/31/2022)   PRAPARE - Transportation    Lack of Transportation (Medical): Patient unable to answer    Lack of Transportation (Non-Medical): Patient unable to answer  Physical Activity: Inactive (02/03/2021)   [facility] from [facility]   Exercise Vital Sign    Days of Exercise per Week: 0 days    Minutes of Exercise per Session: 0 min  Stress: Stress Concern Present (02/03/2021)   Received from [facility] - Occupational Stress Questionnaire    Feeling of Stress : To some extent  Social Connections: Unknown (06/16/2021)   Received from [facility], [facility]    Social Network    Social Network: Not on file  Intimate Partner Violence: Patient Unable To Answer (12/31/2022)   Humiliation, Afraid, Rape, and Kick questionnaire    Fear of Current or Ex-Partner: Patient unable to answer    Emotionally Abused: Patient unable to answer    Physically Abused: Patient unable to answer    Sexually Abused: Patient unable to answer   Family History:  Family History  Problem Relation Age of Onset   Mental illness Father        depression   COPD Father    Alcohol abuse Brother    Breast cancer Neg Hx     Review of Systems: Constitutional: Doesn't report fevers, chills or abnormal weight loss Eyes: Doesn't report blurriness of vision Ears, nose, mouth, throat, and face: Doesn't report sore throat Respiratory: +dyspnea Cardiovascular: Doesn't report palpitation, chest discomfort  Gastrointestinal:  Doesn't report nausea, constipation, diarrhea GU: Doesn't report incontinence Skin: Doesn't report skin rashes Neurological: Per HPI Musculoskeletal: Doesn't report joint pain Behavioral/Psych: Doesn't report anxiety  Physical Exam: Vitals:   04/13/23 1355  BP: 127/74  Pulse: 72  Resp: 18  Temp: 97.7 F (36.5 C)  SpO2: 96%   KPS: 60. General: laying down, eyes closed Head: Normal EENT: No conjunctival injection or scleral icterus.  Lungs: Normal Cardiac: Regular rate Abdomen: Non-distended abdomen Skin: +purpura, arms and legs Extremities: +edema  Neurologic Exam: Mental Status: Drowsy, alert, attentive to examiner with tactile stimulation. Oriented to self and environment when attentive. Language is fluent with intact comprehension.  Displays some disorganized thinking. Age advanced psychomotor slowing. Cranial Nerves: Visual acuity is grossly normal. Visual fields are full. Extra-ocular movements intact. No ptosis. Face is symmetric Motor: Tone and bulk are normal. Power is 2/5 in left arm, 4/5 in left leg. Reflexes are symmetric, no  pathologic reflexes present.  Sensory: Intact to light touch Gait: Non ambulatory   Labs: I have reviewed the data as listed    Component Value Date/Time   NA 138 03/29/2023 0947   NA 142 06/30/2017 1815   K 3.9 03/29/2023 0947   CL 104 03/29/2023 0947   CO2 27 03/29/2023 0947  GLUCOSE 98 03/29/2023 0947   BUN 17 03/29/2023 0947   BUN 14 06/30/2017 1815   CREATININE 0.62 03/29/2023 0947   CREATININE 0.72 06/07/2015 0825   CALCIUM 9.6 03/29/2023 0947   PROT 6.4 (L) 03/29/2023 0947   PROT 7.0 06/30/2017 1815   ALBUMIN 3.9 03/29/2023 0947   ALBUMIN 4.6 06/30/2017 1815   AST 18 03/29/2023 0947   ALT 11 03/29/2023 0947   ALKPHOS 73 03/29/2023 0947   BILITOT 0.5 03/29/2023 0947   GFRNONAA >60 03/29/2023 0947   GFRAA  01/18/2022 0405    UNSATISFACTORY SPECIMEN. CLIENT REQUESTS SPECIMEN TO BE ANALYZED.   Lab Results  Component Value Date   WBC 5.6 04/13/2023   NEUTROABS 2.7 04/13/2023   HGB 15.1 (H) 04/13/2023   HCT 44.7 04/13/2023   MCV 90.9 04/13/2023   PLT 346 04/13/2023    Imaging:  [facility] Clinician Interpretation: I have personally reviewed the CNS images as listed.  My interpretation, in the context of the patient's clinical presentation, is stable disease  MR BRAIN W WO CONTRAST Result Date: 04/08/2023 CLINICAL DATA:  Provided history: Brain/CNS neoplasm, assess treatment response. Pleomorphic xanthoastrocytoma brain. Radiation-induced brain necrosis. EXAM: MRI HEAD WITHOUT AND WITH CONTRAST TECHNIQUE: Multiplanar, multiecho pulse sequences of the brain and surrounding structures were obtained without and with intravenous contrast. CONTRAST:  5mL GADAVIST GADOBUTROL 1 MMOL/ML IV SOLN COMPARISON:  Prior brain MRI examinations 02/11/2023 and earlier. FINDINGS: Brain: Generalized cerebral and cerebellar atrophy. Resection cavity again demonstrated within the right temporal lobe (deep to a cranioplasty). Comparison to the most recent prior brain MRI of 02/11/2023 is limited  due to motion degradation of the prior study. However, irregular enhancement within the right temporal lobe, lateral right occipital lobe and right periatrial region has decreased from the prior brain MRI of 12/31/2022. Extensive surrounding T2 FLAIR hyperintense signal abnormality within the right cerebral hemisphere has decreased from the prior brain MRI examinations of 02/11/2023 and 12/31/2022. For instance, signal abnormality does not extend as far into the right frontal lobe as it had previously. Mass effect has also significantly decreased and midline shift has resolved. As compared to the prior examination of 02/11/2023, are new small foci of restricted diffusion along the body of the right lateral ventricle (series 5, image 33) (series 13, images 17-21). Additionally, foci of restricted diffusion along portions of the right lateral ventricle atrium, occipital horn and temporal horn have increased. As before, there are scattered punctate and irregular foci of susceptibility-weighted signal loss within/about the treatment site, reflecting a combination of chronic hemorrhage and mineralization. Mild T2 FLAIR hyperintense signal changes elsewhere within the cerebral white matter and pons, nonspecific but possibly reflecting combination of post-treatment changes and chronic small vessel ischemic disease. Wallerian degeneration along the right corticospinal tract. No extra-axial fluid collection. Vascular: Maintained flow voids within the proximal large arterial vessels. Skull and upper cervical spine: Right-sided cranioplasty. Incompletely assessed cervical spondylosis. Slight C3-C4 grade 1 retrolisthesis. Sinuses/Orbits: No mass or acute finding within the imaged orbits. Prior bilateral ocular lens replacement. Mild mucosal thickening within the left frontal sinus. Mild-to-moderate bilateral ethmoid sinusitis. Mucosal thickening within the bilateral sphenoid sinuses (mild right, moderate left). Mild mucosal  thickening within the left maxillary sinus. IMPRESSION: 1. Comparison to the most recent prior brain MRI of 02/11/2023 is limited due to motion degradation of the prior exam. However, irregular enhancement within the right temporal lobe, lateral right occipital lobe and right periatrial region has decreased from the prior brain MRI of 12/31/2022. Extensive surrounding  T2 FLAIR hyperintense signal abnormality within the right cerebral hemisphere has decreased in extent. Mass effect has also significantly decreased and midline shift has resolved. 2. As compared to the prior examination of 02/11/2023, are new small foci of restricted diffusion along the body of the right lateral ventricle. Additionally, foci of restricted diffusion have increased along portions of the right lateral ventricle atrium, occipital horn and temporal horn. This may reflect treatment-related necrosis in the setting of prior radiation therapy and Avastin therapy, or tumor progression. 3. Paranasal sinus disease as described. Electronically Signed   By: Jackey Loge D.O.   On: 04/08/2023 14:38    Assessment/Plan Pleomorphic xanthoastrocytoma of brain [facility])  Seizure disorder (HCC)  Radiation therapy induced brain necrosis  Lindsey Daniels is clinically stable today.  MRI brain demonstrates stable findings.  DWI signal abnormality is likely avastin artifact.  She is cleared for cycle #5 avastin today.   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  She previously tolerated and responded well to avastin (2023) -Repeat brain MRI after cycle #8 -Con't physical therapy at SNF -Con't Keppra 1500mg  BID, Lamictal 150mg  BID   We ask that Lindsey Daniels return to clinic in 2 weeks for cycle #6 avastin, or sooner if needed.     All questions were answered. The patient knows to call  the clinic with any problems, questions or concerns. No barriers to learning were detected.  The total time spent in the encounter was 40 minutes and more than 50% was on counseling and review of test results   Henreitta Leber, MD Medical Director of Neuro-Oncology [facility] 04/13/23 2:00 PM

## 2023-04-13 NOTE — Patient Instructions (Signed)
 [facility] - A DEPT OF [facility]  Discharge Instructions: Thank you for choosing [facility] to provide your oncology and hematology care.   If you have a lab appointment with the Cancer Center, please go directly to the Cancer Center and check in at the registration area.   Wear comfortable clothing and clothing appropriate for easy access to any Portacath or PICC line.   We strive to give you quality time with your provider. You may need to reschedule your appointment if you arrive late (15 or more minutes).  Arriving late affects you and other patients whose appointments are after yours.  Also, if you miss three or more appointments without notifying the office, you may be dismissed from the clinic at the provider's discretion.      For prescription refill requests, have your pharmacy contact our office and allow 72 hours for refills to be completed.    Today you received the following chemotherapy and/or immunotherapy agents: bevacizumab-awwb      To help prevent nausea and vomiting after your treatment, we encourage you to take your nausea medication as directed.  BELOW ARE SYMPTOMS THAT SHOULD BE REPORTED IMMEDIATELY: *FEVER GREATER THAN 100.4 F (38 C) OR HIGHER *CHILLS OR SWEATING *NAUSEA AND VOMITING THAT IS NOT CONTROLLED WITH YOUR NAUSEA MEDICATION *UNUSUAL SHORTNESS OF BREATH *UNUSUAL BRUISING OR BLEEDING *URINARY PROBLEMS (pain or burning when urinating, or frequent urination) *BOWEL PROBLEMS (unusual diarrhea, constipation, pain near the anus) TENDERNESS IN MOUTH AND THROAT WITH OR WITHOUT PRESENCE OF ULCERS (sore throat, sores in mouth, or a toothache) UNUSUAL RASH, SWELLING OR PAIN  UNUSUAL VAGINAL DISCHARGE OR ITCHING   Items with * indicate a potential emergency and should be followed up as soon as possible or go to the Emergency Department if any problems should occur.  Please show the CHEMOTHERAPY ALERT CARD or  IMMUNOTHERAPY ALERT CARD at check-in to the Emergency Department and triage nurse.  Should you have questions after your visit or need to cancel or reschedule your appointment, please contact [facility] - A DEPT OF [facility]  Dept: [phone]  and follow the prompts.  Office hours are 8:00 a.m. to 4:30 p.m. Monday - Friday. Please note that voicemails left after 4:00 p.m. may not be returned until the following business day.  We are closed weekends and major holidays. You have access to a nurse at all times for urgent questions. Please call the main number to the clinic Dept: [phone] and follow the prompts.   For any non-urgent questions, you may also contact your provider using MyChart. We now offer e-Visits for anyone 12 and older to request care online for non-urgent symptoms. For details visit [email or web address].   Also download the MyChart app! Go to the app store, search "MyChart", open the app, select [place], and log in with your MyChart username and password.

## 2023-04-13 NOTE — Telephone Encounter (Signed)
 Lindsey Daniels

## 2023-04-14 ENCOUNTER — Other Ambulatory Visit: Payer: Self-pay

## 2023-04-23 ENCOUNTER — Telehealth: Payer: Self-pay | Admitting: Internal Medicine

## 2023-04-23 NOTE — Telephone Encounter (Signed)
 Completed - [facility] coordinator called to reschedule patient's appts.

## 2023-04-24 ENCOUNTER — Other Ambulatory Visit: Payer: Self-pay

## 2023-04-27 ENCOUNTER — Inpatient Hospital Stay: Payer: Medicare Other | Attending: Internal Medicine

## 2023-04-27 ENCOUNTER — Inpatient Hospital Stay (HOSPITAL_BASED_OUTPATIENT_CLINIC_OR_DEPARTMENT_OTHER): Payer: Medicare Other | Admitting: Internal Medicine

## 2023-04-27 ENCOUNTER — Inpatient Hospital Stay: Payer: Medicare Other

## 2023-04-27 VITALS — BP 120/80 | HR 84 | Temp 97.3°F | Resp 15

## 2023-04-27 DIAGNOSIS — Z79899 Other long term (current) drug therapy: Secondary | ICD-10-CM | POA: Insufficient documentation

## 2023-04-27 DIAGNOSIS — C712 Malignant neoplasm of temporal lobe: Secondary | ICD-10-CM | POA: Insufficient documentation

## 2023-04-27 DIAGNOSIS — C719 Malignant neoplasm of brain, unspecified: Secondary | ICD-10-CM

## 2023-04-27 DIAGNOSIS — I6789 Other cerebrovascular disease: Secondary | ICD-10-CM

## 2023-04-27 DIAGNOSIS — Z5112 Encounter for antineoplastic immunotherapy: Secondary | ICD-10-CM | POA: Insufficient documentation

## 2023-04-27 LAB — CBC WITH DIFFERENTIAL (CANCER CENTER ONLY)
Abs Immature Granulocytes: 0.02 10*3/uL (ref 0.00–0.07)
Basophils Absolute: 0.1 10*3/uL (ref 0.0–0.1)
Basophils Relative: 1 %
Eosinophils Absolute: 0.3 10*3/uL (ref 0.0–0.5)
Eosinophils Relative: 4 %
HCT: 46.2 % — ABNORMAL HIGH (ref 36.0–46.0)
Hemoglobin: 15.5 g/dL — ABNORMAL HIGH (ref 12.0–15.0)
Immature Granulocytes: 0 %
Lymphocytes Relative: 32 %
Lymphs Abs: 2 10*3/uL (ref 0.7–4.0)
MCH: 29.7 pg (ref 26.0–34.0)
MCHC: 33.5 g/dL (ref 30.0–36.0)
MCV: 88.5 fL (ref 80.0–100.0)
Monocytes Absolute: 0.5 10*3/uL (ref 0.1–1.0)
Monocytes Relative: 8 %
Neutro Abs: 3.4 10*3/uL (ref 1.7–7.7)
Neutrophils Relative %: 55 %
Platelet Count: 325 10*3/uL (ref 150–400)
RBC: 5.22 MIL/uL — ABNORMAL HIGH (ref 3.87–5.11)
RDW: 13 % (ref 11.5–15.5)
WBC Count: 6.3 10*3/uL (ref 4.0–10.5)
nRBC: 0 % (ref 0.0–0.2)

## 2023-04-27 LAB — CMP (CANCER CENTER ONLY)
ALT: 5 U/L (ref 0–44)
AST: 16 U/L (ref 15–41)
Albumin: 4.1 g/dL (ref 3.5–5.0)
Alkaline Phosphatase: 85 U/L (ref 38–126)
Anion gap: 9 (ref 5–15)
BUN: 9 mg/dL (ref 8–23)
CO2: 28 mmol/L (ref 22–32)
Calcium: 10.1 mg/dL (ref 8.9–10.3)
Chloride: 101 mmol/L (ref 98–111)
Creatinine: 0.56 mg/dL (ref 0.44–1.00)
GFR, Estimated: >60 mL/min (ref >60)
Glucose, Bld: 101 mg/dL — ABNORMAL HIGH (ref 70–99)
Potassium: 3.7 mmol/L (ref 3.5–5.1)
Sodium: 138 mmol/L (ref 135–145)
Total Bilirubin: 0.6 mg/dL (ref 0.0–1.2)
Total Protein: 7 g/dL (ref 6.5–8.1)

## 2023-04-27 MED ORDER — SODIUM CHLORIDE 0.9 % IV SOLN: INTRAVENOUS | Status: DC

## 2023-04-27 MED ORDER — SODIUM CHLORIDE 0.9 % IV SOLN
10.0000 mg/kg | Freq: Once | INTRAVENOUS | Status: AC
  Administered 2023-04-27: 600 mg via INTRAVENOUS
  Filled 2023-04-27: qty 8

## 2023-04-27 NOTE — Progress Notes (Signed)
 [facility] at [facility] [address]  [place], [place] [address] [phone]   Interval Evaluation  Date of Service: 04/27/23 Patient Name: Lindsey Daniels Patient MRN: [id number] Patient DOB: [birth year] Provider: Henreitta Leber, MD  Identifying Statement:  Lindsey Daniels is a 68 y.o. female with right temporal  pleomorphic xanthoastrocytoma    Oncologic History: Oncology History  Pleomorphic xanthoastrocytoma of brain [facility])  09/29/2013 Surgery   Right temporal craniotomy, resection with Dr. Newell Coral.  Path is PXA WHO II.   11/29/2020 Surgery   Disease progression, second craniotomy with Dr. Franky Macho, sub-total resection.  Path is unchanged.   02/21/2021 - 04/02/2021 Radiation Therapy   IMRT radiation therapy with Dr. Mitzi Hansen   12/16/2021 Treatment Plan Change   Completes 4 cycles of avastin 10mg /kg for severe inflammation, poor tolerance of steroids   12/02/2022 Progression   Progression of Disease   01/11/2023 -  Chemotherapy   Initiates dual BRAF/MEK inhibition with dabrafenib+trametinib   02/15/2023 -  Chemotherapy   Patient is on Treatment Plan : BRAIN GBM Bevacizumab 14d x 6 cycles       Biomarkers:   Interval History: Lindsey Daniels presents today for avastin infusion.  She continues to reside in the skilled nursing facility, though per brother the rehab services have lapsed.  Mental status is stable from prior.  Remains non ambulatory due to significant left sided weakness.  No seizures in several months now.  She continues on combined BRAF/MEK inhibitory therapy, originally initaited 11/25.  She does complain of some pain in her left shoulder, hoarseness.  Prior: She describes twitching of the left side which persisted for more than 2 hours.  During admission Keppra was increased  to 1500mg  twice per day, decadron restarted 4mg  twice per day.  She remains quite weak on the left side compared to prior, particularly with the arm.  She is  walking minimally with the walker, mostly using the wheelchair.  Very high levels of irritability, mood swings noted at the facility.  She is confused at times, but falls have been avoided.  H+P (01/30/21) Patient presents today after recent re-resection for progressive right temporal PXA.  She initially presented with a seizure in 2015; imaging demonstrated enhancing right temporal lesion which was grossly resected by Dr. Newell Coral.  After last follow up in 2020, she began to experience headaches, ear fullness and some confusion this fall.  Imaging demonstrated marked progression of disease; she went for second surgery on 11/29/20.  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   Medications: Current Outpatient Medications on File Prior to Visit  Medication Sig Dispense Refill   acetaminophen (TYLENOL) 650 MG CR tablet Take 650 mg by mouth every 8 (eight) hours as needed for pain.     apixaban (ELIQUIS) 5 MG TABS tablet Take 1 tablet (5 mg total) by mouth 2 (two) times daily. 60 tablet 2   atorvastatin (LIPITOR) 20 MG tablet Take 1 tablet (20 mg total) by mouth daily. (Patient taking differently: Take 40 mg by mouth daily.) 90 tablet 3   cetirizine (ZYRTEC) 10 MG tablet Take 10 mg by mouth daily.     dabrafenib mesylate (TAFINLAR) 75 MG capsule Take 2 capsules (150 mg total) by mouth 2 (two) times daily. Take on an empty stomach 1 hour before or 2 hours after meals. 120 capsule 1   fluticasone (FLONASE) 50 MCG/ACT nasal  spray Place 1-2 sprays into both nostrils daily. (Patient taking differently: Place 2 sprays into both nostrils daily.) 16 g 0   lamoTRIgine (LAMICTAL) 150 MG tablet Take 1 tablet (150 mg total) by mouth 2 (two) times daily.     levETIRAcetam (KEPPRA) 750 MG tablet Take 2 tablets (1,500 mg total) by mouth 2 (two) times daily. 120 tablet 1   metoprolol tartrate (LOPRESSOR) 25 MG tablet Take 12.5 mg by mouth 2  (two) times daily.     Midazolam (NAYZILAM) 5 MG/0.1ML SOLN Place 5 mg into the nose every 6 (six) hours as needed (prolonged seizure, greater than 5 minutes). 1 each 0   Multiple Vitamin (MULTIVITAMIN WITH MINERALS) TABS tablet Take 1 tablet by mouth daily.     omeprazole (PRILOSEC) 20 MG capsule Take 20 mg by mouth daily.     ondansetron (ZOFRAN) 4 MG tablet Take 4 mg by mouth every 6 (six) hours as needed for nausea or vomiting.     potassium chloride SA (KLOR-CON M) 20 MEQ tablet Take 1 tablet (20 mEq total) by mouth 2 (two) times daily. 30 tablet 1   sertraline (ZOLOFT) 100 MG tablet Take 1 tablet (100 mg total) by mouth daily. 90 tablet 3   sertraline (ZOLOFT) 50 MG tablet Take 50 mg by mouth daily.     trametinib dimethyl sulfoxide (MEKINIST) 2 MG tablet Take 1 tablet (2 mg total) by mouth daily. Take 1 hour before or 2 hours after a meal. Store refrigerated in original container. 30 tablet 1   No current facility-administered medications on file prior to visit.    Allergies:  Allergies  Allergen Reactions   Rifapentine Other (See Comments)    Flu-like symptoms   Amoxicillin Rash   Clavulanic Acid Rash   Past Medical History:  Past Medical History:  Diagnosis Date   Allergy    Anal fissure    Anxiety    Cataract    Depression    Elevated LFTs    Hyperglycemia    Hyperlipidemia    Hypertension    Osteoarthritis    Pars defect of lumbar spine    Pleomorphic xanthoastrocytoma of temporal lobe (HCC)    Right   Seizures (HCC)    Spondylolisthesis    lumbar   Thrombocytopenia (HCC) 12/31/2022   Past Surgical History:  Past Surgical History:  Procedure Laterality Date   cataract     CRANIOTOMY N/A 10/02/2013   Procedure: 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;  Surgeon: Hewitt Shorts, MD;  Location: [facility];  Service: Neurosurgery;  Laterality: N/A;  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   CRANIOTOMY Right 11/29/2020   Procedure: Right  Parietal craniotomy for tumor resection;  Surgeon: Coletta Memos, MD;  Location: [facility];  Service: Neurosurgery;  Laterality: Right;   HEEL SPUR SURGERY     Social History:  Social History   Socioeconomic History   Marital status: Divorced    Spouse name: separated-no papers file   Number of children: 0   Years of education: Not on file   Highest education level: Not on file  Occupational History   Occupation: RT    Employer: [employer]    Comment: [employer] and [employer]  Tobacco Use   Smoking status: Never   Smokeless tobacco: Never  Substance and Sexual Activity   Alcohol use: Yes    Alcohol/week: 0.0 - 4.0 standard drinks of alcohol    Comment: social  Drug use: No   Sexual activity: Not Currently    Partners: Male  Other Topics Concern   Not on file  Social History Narrative   Separated from second husband.  Very contentious split.   Social Drivers of Corporate investment banker Strain: Low Risk  (01/15/2022)   Overall Financial Resource Strain (CARDIA)    Difficulty of Paying Living Expenses: Not hard at all  Food Insecurity: Patient Unable To Answer (12/31/2022)   Hunger Vital Sign    Worried About Programme researcher, broadcasting/film/video in the Last Year: Patient unable to answer    Ran Out of Food in the Last Year: Patient unable to answer  Transportation Needs: Patient Unable To Answer (12/31/2022)   PRAPARE - Transportation    Lack of Transportation (Medical): Patient unable to answer    Lack of Transportation (Non-Medical): Patient unable to answer  Physical Activity: Inactive (02/03/2021)   Received from [facility], [facility]   Exercise Vital Sign    Days of Exercise per Week: 0 days    Minutes of Exercise per Session: 0 min  Stress: Stress Concern Present (02/03/2021)   Received from [facility], [facility] of Occupational Health - Occupational Stress Questionnaire    Feeling of Stress : To some  extent  Social Connections: Unknown (06/16/2021)   Received from [facility], [facility]   Social Network    Social Network: Not on file  Intimate Partner Violence: Patient Unable To Answer (12/31/2022)   Humiliation, Afraid, Rape, and Kick questionnaire    Fear of Current or Ex-Partner: Patient unable to answer    Emotionally Abused: Patient unable to answer    Physically Abused: Patient unable to answer    Sexually Abused: Patient unable to answer   Family History:  Family History  Problem Relation Age of Onset   Mental illness Father        depression   COPD Father    Alcohol abuse Brother    Breast cancer Neg Hx     Review of Systems: Constitutional: Doesn't report fevers, chills or abnormal weight loss Eyes: Doesn't report blurriness of vision Ears, nose, mouth, throat, and face: Doesn't report sore throat Respiratory: +dyspnea Cardiovascular: Doesn't report palpitation, chest discomfort  Gastrointestinal:  Doesn't report nausea, constipation, diarrhea GU: Doesn't report incontinence Skin: Doesn't report skin rashes Neurological: Per HPI Musculoskeletal: Doesn't report joint pain Behavioral/Psych: Doesn't report anxiety  Physical Exam: Vitals:   04/27/23 1241  BP: 120/80  Pulse: 84  Resp: 15  Temp: (!) 97.3 F (36.3 C)  SpO2: 97%   KPS: 60. General: laying down, eyes closed Head: Normal EENT: No conjunctival injection or scleral icterus.  Lungs: Normal Cardiac: Regular rate Abdomen: Non-distended abdomen Skin: +purpura, arms and legs Extremities: +edema  Neurologic Exam: Mental Status: Drowsy, alert, attentive to examiner with tactile stimulation. Oriented to self and environment when attentive. Language is fluent with intact comprehension.  Displays some disorganized thinking. Age advanced psychomotor slowing. Cranial Nerves: Visual acuity is grossly normal. Visual fields are full. Extra-ocular movements intact. No ptosis. Face is symmetric Motor:  Tone and bulk are normal. Power is 2/5 in left arm, 4/5 in left leg. Reflexes are symmetric, no pathologic reflexes present.  Sensory: Intact to light touch Gait: Non ambulatory   Labs: I have reviewed the data as listed    Component Value Date/Time   NA 139 04/13/2023 1330   NA 142 06/30/2017 1815   K 3.9 04/13/2023 1330  CL 104 04/13/2023 1330   CO2 27 04/13/2023 1330   GLUCOSE 110 (H) 04/13/2023 1330   BUN 10 04/13/2023 1330   BUN 14 06/30/2017 1815   CREATININE 0.60 04/13/2023 1330   CREATININE 0.72 06/07/2015 0825   CALCIUM 9.9 04/13/2023 1330   PROT 6.9 04/13/2023 1330   PROT 7.0 06/30/2017 1815   ALBUMIN 4.0 04/13/2023 1330   ALBUMIN 4.6 06/30/2017 1815   AST 17 04/13/2023 1330   ALT 7 04/13/2023 1330   ALKPHOS 83 04/13/2023 1330   BILITOT 0.6 04/13/2023 1330   GFRNONAA >60 04/13/2023 1330   GFRAA  01/18/2022 0405    UNSATISFACTORY SPECIMEN. CLIENT REQUESTS SPECIMEN TO BE ANALYZED.   Lab Results  Component Value Date   WBC 5.6 04/13/2023   NEUTROABS 2.7 04/13/2023   HGB 15.1 (H) 04/13/2023   HCT 44.7 04/13/2023   MCV 90.9 04/13/2023   PLT 346 04/13/2023    Assessment/Plan No diagnosis found.  Lindsey Daniels is clinically stable today.  Labs are within normal limits.  She is cleared for cycle #6 avastin today.   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  She previously tolerated and responded well to avastin (2023) -Repeat brain MRI after cycle #8 -Re-initiate with physical therapy at SNF -Con't Keppra 1500mg  BID, Lamictal 150mg  BID  We ask that Lindsey Daniels return to clinic in 2 weeks for cycle #7 avastin, or sooner if needed.  MRI should follow cycle #8.  All questions were answered. The patient knows to call the clinic with any problems, questions or concerns. No barriers to learning were  detected.  The total time spent in the encounter was 30 minutes and more than 50% was on counseling and review of test results   Henreitta Leber, MD Medical Director of Neuro-Oncology [facility] at [facility] 04/27/23 12:45 PM

## 2023-04-27 NOTE — Patient Instructions (Signed)
 [facility]  Discharge Instructions: Thank you for choosing [facility] to provide your oncology and hematology care.   If you have a lab appointment with the Cancer Center, please go directly to the Cancer Center and check in at the registration area.   Wear comfortable clothing and clothing appropriate for easy access to any Portacath or PICC line.   We strive to give you quality time with your provider. You may need to reschedule your appointment if you arrive late (15 or more minutes).  Arriving late affects you and other patients whose appointments are after yours.  Also, if you miss three or more appointments without notifying the office, you may be dismissed from the clinic at the provider's discretion.      For prescription refill requests, have your pharmacy contact our office and allow 72 hours for refills to be completed.    Today you received the following chemotherapy and/or immunotherapy agents: bevacizumab      To help prevent nausea and vomiting after your treatment, we encourage you to take your nausea medication as directed.  BELOW ARE SYMPTOMS THAT SHOULD BE REPORTED IMMEDIATELY: *FEVER GREATER THAN 100.4 F (38 C) OR HIGHER *CHILLS OR SWEATING *NAUSEA AND VOMITING THAT IS NOT CONTROLLED WITH YOUR NAUSEA MEDICATION *UNUSUAL SHORTNESS OF BREATH *UNUSUAL BRUISING OR BLEEDING *URINARY PROBLEMS (pain or burning when urinating, or frequent urination) *BOWEL PROBLEMS (unusual diarrhea, constipation, pain near the anus) TENDERNESS IN MOUTH AND THROAT WITH OR WITHOUT PRESENCE OF ULCERS (sore throat, sores in mouth, or a toothache) UNUSUAL RASH, SWELLING OR PAIN  UNUSUAL VAGINAL DISCHARGE OR ITCHING   Items with * indicate a potential emergency and should be followed up as soon as possible or go to the Emergency Department if any problems should occur.  Please show the CHEMOTHERAPY ALERT CARD or  IMMUNOTHERAPY ALERT CARD at check-in to the Emergency Department and triage nurse.  Should you have questions after your visit or need to cancel or reschedule your appointment, please contact [facility]  Dept: [phone]  and follow the prompts.  Office hours are 8:00 a.m. to 4:30 p.m. Monday - Friday. Please note that voicemails left after 4:00 p.m. may not be returned until the following business day.  We are closed weekends and major holidays. You have access to a nurse at all times for urgent questions. Please call the main number to the clinic Dept: [phone] and follow the prompts.   For any non-urgent questions, you may also contact your provider using MyChart. We now offer e-Visits for anyone 81 and older to request care online for non-urgent symptoms. For details visit [email or web address].   Also download the MyChart app! Go to the app store, search "MyChart", open the app, select [facility], and log in with your MyChart username and password.

## 2023-05-11 ENCOUNTER — Other Ambulatory Visit: Payer: Medicare Other

## 2023-05-11 ENCOUNTER — Inpatient Hospital Stay

## 2023-05-11 ENCOUNTER — Ambulatory Visit: Payer: Medicare Other

## 2023-05-11 ENCOUNTER — Ambulatory Visit: Payer: Medicare Other | Admitting: Internal Medicine

## 2023-05-11 ENCOUNTER — Inpatient Hospital Stay: Admitting: Internal Medicine

## 2023-05-11 VITALS — BP 129/84 | HR 88 | Temp 97.7°F | Resp 15 | Wt 108.0 lb

## 2023-05-11 DIAGNOSIS — G40909 Epilepsy, unspecified, not intractable, without status epilepticus: Secondary | ICD-10-CM

## 2023-05-11 DIAGNOSIS — I6789 Other cerebrovascular disease: Secondary | ICD-10-CM

## 2023-05-11 DIAGNOSIS — Z5112 Encounter for antineoplastic immunotherapy: Secondary | ICD-10-CM | POA: Diagnosis not present

## 2023-05-11 DIAGNOSIS — C719 Malignant neoplasm of brain, unspecified: Secondary | ICD-10-CM | POA: Diagnosis not present

## 2023-05-11 LAB — CMP (CANCER CENTER ONLY)
ALT: 5 U/L (ref 0–44)
AST: 19 U/L (ref 15–41)
Albumin: 4.4 g/dL (ref 3.5–5.0)
Alkaline Phosphatase: 108 U/L (ref 38–126)
Anion gap: 10 (ref 5–15)
BUN: 14 mg/dL (ref 8–23)
CO2: 26 mmol/L (ref 22–32)
Calcium: 10.4 mg/dL — ABNORMAL HIGH (ref 8.9–10.3)
Chloride: 102 mmol/L (ref 98–111)
Creatinine: 0.63 mg/dL (ref 0.44–1.00)
GFR, Estimated: >60 mL/min (ref >60)
Glucose, Bld: 111 mg/dL — ABNORMAL HIGH (ref 70–99)
Potassium: 4.2 mmol/L (ref 3.5–5.1)
Sodium: 138 mmol/L (ref 135–145)
Total Bilirubin: 1 mg/dL (ref 0.0–1.2)
Total Protein: 7.5 g/dL (ref 6.5–8.1)

## 2023-05-11 LAB — CBC WITH DIFFERENTIAL (CANCER CENTER ONLY)
Abs Immature Granulocytes: 0 10*3/uL (ref 0.00–0.07)
Basophils Absolute: 0.1 10*3/uL (ref 0.0–0.1)
Basophils Relative: 1 %
Eosinophils Absolute: 0.3 10*3/uL (ref 0.0–0.5)
Eosinophils Relative: 5 %
HCT: 49.6 % — ABNORMAL HIGH (ref 36.0–46.0)
Hemoglobin: 16.3 g/dL — ABNORMAL HIGH (ref 12.0–15.0)
Immature Granulocytes: 0 %
Lymphocytes Relative: 26 %
Lymphs Abs: 1.3 10*3/uL (ref 0.7–4.0)
MCH: 29.2 pg (ref 26.0–34.0)
MCHC: 32.9 g/dL (ref 30.0–36.0)
MCV: 88.7 fL (ref 80.0–100.0)
Monocytes Absolute: 0.6 10*3/uL (ref 0.1–1.0)
Monocytes Relative: 12 %
Neutro Abs: 2.8 10*3/uL (ref 1.7–7.7)
Neutrophils Relative %: 56 %
Platelet Count: 256 10*3/uL (ref 150–400)
RBC: 5.59 MIL/uL — ABNORMAL HIGH (ref 3.87–5.11)
RDW: 13.5 % (ref 11.5–15.5)
WBC Count: 5 10*3/uL (ref 4.0–10.5)
nRBC: 0 % (ref 0.0–0.2)

## 2023-05-11 MED ORDER — SODIUM CHLORIDE 0.9 % IV SOLN
10.0000 mg/kg | Freq: Once | INTRAVENOUS | Status: AC
Start: 2023-05-11 — End: 2023-05-11
  Administered 2023-05-11: 500 mg via INTRAVENOUS
  Filled 2023-05-11: qty 4

## 2023-05-11 MED ORDER — SODIUM CHLORIDE 0.9 % IV SOLN
INTRAVENOUS | Status: DC
Start: 2023-05-11 — End: 2023-05-11

## 2023-05-11 NOTE — Progress Notes (Signed)
 [facility] [address]  [place], [place] [address] [phone]   Interval Evaluation  Date of Service: 05/11/23 Patient Name: Lindsey Daniels Patient MRN: [id number] Patient DOB: 25-Aug-1955 Provider: Henreitta Leber, MD  Identifying Statement:  Lindsey Daniels is a 68 y.o. female with right temporal  pleomorphic xanthoastrocytoma    Oncologic History: Oncology History  Pleomorphic xanthoastrocytoma of brain [facility])  09/29/2013 Surgery   Right temporal craniotomy, resection with Dr. Newell Coral.  Path is PXA WHO II.   11/29/2020 Surgery   Disease progression, second craniotomy with Dr. Franky Macho, sub-total resection.  Path is unchanged.   02/21/2021 - 04/02/2021 Radiation Therapy   IMRT radiation therapy with Dr. Mitzi Hansen   12/16/2021 Treatment Plan Change   Completes 4 cycles of avastin 10mg /kg for severe inflammation, poor tolerance of steroids   12/02/2022 Progression   Progression of Disease   01/11/2023 -  Chemotherapy   Initiates dual BRAF/MEK inhibition with dabrafenib+trametinib   02/15/2023 -  Chemotherapy   Patient is on Treatment Plan : BRAIN GBM Bevacizumab 14d x 6 cycles       Biomarkers:   Interval History: Camela Wich presents today for avastin infusion.  No new or progressive changes reported.  She continues to reside in the skilled nursing facility, though per brother the rehab services have lapsed.  Mental status is stable from prior.  Remains non ambulatory due to significant left sided weakness.  No seizures in several months now.  She continues on combined BRAF/MEK inhibitory therapy, originally initaited 11/25.  She does complain of some pain in her left shoulder, hoarseness.  Prior: She describes twitching of the left side which persisted for more than 2 hours.  During admission Keppra was increased  to 1500mg  twice per day, decadron restarted 4mg  twice per day.  She remains quite weak on the left side compared to  prior, particularly with the arm.  She is walking minimally with the walker, mostly using the wheelchair.  Very high levels of irritability, mood swings noted at the facility.  She is confused at times, but falls have been avoided.  H+P (01/30/21) Patient presents today after recent re-resection for progressive right temporal PXA.  She initially presented with a seizure in 2015; imaging demonstrated enhancing right temporal lesion which was grossly resected by Dr. Newell Coral.  After last follow up in 2020, she began to experience headaches, ear fullness and some confusion this fall.  Imaging demonstrated marked progression of disease; she went for second surgery on 11/29/20.  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   Medications: Current Outpatient Medications on File Prior to Visit  Medication Sig Dispense Refill   acetaminophen (TYLENOL) 650 MG CR tablet Take 650 mg by mouth every 8 (eight) hours as needed for pain.     apixaban (ELIQUIS) 5 MG TABS tablet Take 1 tablet (5 mg total) by mouth 2 (two) times daily. 60 tablet 2   atorvastatin (LIPITOR) 20 MG tablet Take 1 tablet (20 mg total) by mouth daily. (Patient taking differently: Take 40 mg by mouth daily.) 90 tablet 3   cetirizine (ZYRTEC) 10 MG tablet Take 10 mg by mouth daily.     dabrafenib mesylate (TAFINLAR) 75 MG capsule Take 2 capsules (150 mg total) by mouth 2 (two) times daily. Take on an empty stomach 1 hour before or 2 hours after meals. 120 capsule 1  fluticasone (FLONASE) 50 MCG/ACT nasal spray Place 1-2 sprays into both nostrils daily. (Patient taking differently: Place 2 sprays into both nostrils daily.) 16 g 0   lamoTRIgine (LAMICTAL) 150 MG tablet Take 1 tablet (150 mg total) by mouth 2 (two) times daily.     levETIRAcetam (KEPPRA) 750 MG tablet Take 2 tablets (1,500 mg total) by mouth 2 (two) times daily. 120 tablet 1   metoprolol tartrate  (LOPRESSOR) 25 MG tablet Take 12.5 mg by mouth 2 (two) times daily.     Midazolam (NAYZILAM) 5 MG/0.1ML SOLN Place 5 mg into the nose every 6 (six) hours as needed (prolonged seizure, greater than 5 minutes). 1 each 0   Multiple Vitamin (MULTIVITAMIN WITH MINERALS) TABS tablet Take 1 tablet by mouth daily.     omeprazole (PRILOSEC) 20 MG capsule Take 20 mg by mouth daily.     ondansetron (ZOFRAN) 4 MG tablet Take 4 mg by mouth every 6 (six) hours as needed for nausea or vomiting.     potassium chloride SA (KLOR-CON M) 20 MEQ tablet Take 1 tablet (20 mEq total) by mouth 2 (two) times daily. 30 tablet 1   sertraline (ZOLOFT) 100 MG tablet Take 1 tablet (100 mg total) by mouth daily. 90 tablet 3   sertraline (ZOLOFT) 50 MG tablet Take 50 mg by mouth daily.     trametinib dimethyl sulfoxide (MEKINIST) 2 MG tablet Take 1 tablet (2 mg total) by mouth daily. Take 1 hour before or 2 hours after a meal. Store refrigerated in original container. 30 tablet 1   No current facility-administered medications on file prior to visit.    Allergies:  Allergies  Allergen Reactions   Rifapentine Other (See Comments)    Flu-like symptoms   Amoxicillin Rash   Clavulanic Acid Rash   Past Medical History:  Past Medical History:  Diagnosis Date   Allergy    Anal fissure    Anxiety    Cataract    Depression    Elevated LFTs    Hyperglycemia    Hyperlipidemia    Hypertension    Osteoarthritis    Pars defect of lumbar spine    Pleomorphic xanthoastrocytoma of temporal lobe (HCC)    Right   Seizures (HCC)    Spondylolisthesis    lumbar   Thrombocytopenia (HCC) 12/31/2022   Past Surgical History:  Past Surgical History:  Procedure Laterality Date   cataract     CRANIOTOMY N/A 10/02/2013   Procedure: 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;  Surgeon: Hewitt Shorts, MD;  Location: [facility];  Service: Neurosurgery;  Laterality: N/A;  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    CRANIOTOMY Right 11/29/2020   Procedure: Right Parietal craniotomy for tumor resection;  Surgeon: Coletta Memos, MD;  Location: [facility] OR;  Service: Neurosurgery;  Laterality: Right;   HEEL SPUR SURGERY     Social History:  Social History   Socioeconomic History   Marital status: Divorced    Spouse name: separated-no papers file   Number of children: 0   Years of education: Not on file   Highest education level: Not on file  Occupational History   Occupation: RT    Employer: [employer]    Comment: [employer] and  [employer]  Tobacco Use   Smoking status: Never   Smokeless tobacco: Never  Substance and Sexual Activity   Alcohol use: Yes    Alcohol/week: 0.0 - 4.0 standard drinks of alcohol  Comment: social   Drug use: No   Sexual activity: Not Currently    Partners: Male  Other Topics Concern   Not on file  Social History Narrative   Separated from second husband.  Very contentious split.   Social Drivers of Corporate investment banker Strain: Low Risk  (01/15/2022)   Overall Financial Resource Strain (CARDIA)    Difficulty of Paying Living Expenses: Not hard at all  Food Insecurity: Patient Unable To Answer (12/31/2022)   Hunger Vital Sign    Worried About Programme researcher, broadcasting/film/video in the Last Year: Patient unable to answer    Ran Out of Food in the Last Year: Patient unable to answer  Transportation Needs: Patient Unable To Answer (12/31/2022)   PRAPARE - Transportation    Lack of Transportation (Medical): Patient unable to answer    Lack of Transportation (Non-Medical): Patient unable to answer  Physical Activity: Inactive (02/03/2021)   Received from [facility], [facility]   Exercise Vital Sign    Days of Exercise per Week: 0 days    Minutes of Exercise per Session: 0 min  Stress: Stress Concern Present (02/03/2021)   Received from [facility] of Occupational Health - Occupational Stress  Questionnaire    Feeling of Stress : To some extent  Social Connections: Unknown (06/16/2021)   Received from [facility], [facility]   Social Network    Social Network: Not on file  Intimate Partner Violence: Patient Unable To Answer (12/31/2022)   Humiliation, Afraid, Rape, and Kick questionnaire    Fear of Current or Ex-Partner: Patient unable to answer    Emotionally Abused: Patient unable to answer    Physically Abused: Patient unable to answer    Sexually Abused: Patient unable to answer   Family History:  Family History  Problem Relation Age of Onset   Mental illness Father        depression   COPD Father    Alcohol abuse Brother    Breast cancer Neg Hx     Review of Systems: Constitutional: Doesn't report fevers, chills or abnormal weight loss Eyes: Doesn't report blurriness of vision Ears, nose, mouth, throat, and face: Doesn't report sore throat Respiratory: +dyspnea Cardiovascular: Doesn't report palpitation, chest discomfort  Gastrointestinal:  Doesn't report nausea, constipation, diarrhea GU: Doesn't report incontinence Skin: Doesn't report skin rashes Neurological: Per HPI Musculoskeletal: Doesn't report joint pain Behavioral/Psych: Doesn't report anxiety  Physical Exam: Vitals:   05/11/23 1200  BP: 129/84  Pulse: 88  Resp: 15  Temp: 97.7 F (36.5 C)  SpO2: 99%   KPS: 60. General: laying down, eyes closed Head: Normal EENT: No conjunctival injection or scleral icterus.  Lungs: Normal Cardiac: Regular rate Abdomen: Non-distended abdomen Skin: +purpura, arms and legs Extremities: +edema  Neurologic Exam: Mental Status: Drowsy, alert, attentive to examiner with tactile stimulation. Oriented to self and environment when attentive. Language is fluent with intact comprehension.  Displays some disorganized thinking. Age advanced psychomotor slowing. Cranial Nerves: Visual acuity is grossly normal. Visual fields are full. Extra-ocular movements  intact. No ptosis. Face is symmetric Motor: Tone and bulk are normal. Power is 2/5 in left arm, 4/5 in left leg. Reflexes are symmetric, no pathologic reflexes present.  Sensory: Intact to light touch Gait: Non ambulatory   Labs: I have reviewed the data as listed    Component Value Date/Time   NA 138 04/27/2023 1228   NA 142 06/30/2017 1815   K  3.7 04/27/2023 1228   CL 101 04/27/2023 1228   CO2 28 04/27/2023 1228   GLUCOSE 101 (H) 04/27/2023 1228   BUN 9 04/27/2023 1228   BUN 14 06/30/2017 1815   CREATININE 0.56 04/27/2023 1228   CREATININE 0.72 06/07/2015 0825   CALCIUM 10.1 04/27/2023 1228   PROT 7.0 04/27/2023 1228   PROT 7.0 06/30/2017 1815   ALBUMIN 4.1 04/27/2023 1228   ALBUMIN 4.6 06/30/2017 1815   AST 16 04/27/2023 1228   ALT 5 04/27/2023 1228   ALKPHOS 85 04/27/2023 1228   BILITOT 0.6 04/27/2023 1228   GFRNONAA >60 04/27/2023 1228   GFRAA  01/18/2022 0405    UNSATISFACTORY SPECIMEN. CLIENT REQUESTS SPECIMEN TO BE ANALYZED.   Lab Results  Component Value Date   WBC 5.0 05/11/2023   NEUTROABS 2.8 05/11/2023   HGB 16.3 (H) 05/11/2023   HCT 49.6 (H) 05/11/2023   MCV 88.7 05/11/2023   PLT 256 05/11/2023    Assessment/Plan Pleomorphic xanthoastrocytoma of brain [facility])  Seizure disorder (HCC)  Tiffiny Worthy is clinically stable today.  Labs are within normal limits.  She is cleared for cycle #7 avastin today.   Discussed obtaining a humidifier at night for hoarseness secondary to avastin.  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   -Repeat brain MRI after cycle #8 -Continue physical therapy at nursing facility -Con't Keppra 1500mg  BID, Lamictal 150mg  BID  We ask that Germaine Pomfret Brandl return to clinic in 2 weeks for cycle #8 avastin, or sooner if needed.  MRI should follow cycle #8.  All questions were  answered. The patient knows to call the clinic with any problems, questions or concerns. No barriers to learning were detected.  The total time spent in the encounter was 30 minutes and more than 50% was on counseling and review of test results   Henreitta Leber, MD Medical Director of Neuro-Oncology [facility] at [facility] 05/11/23 12:53 PM

## 2023-05-11 NOTE — Progress Notes (Signed)
 Adjust bevacizumab based on current wt=49 kg (10mg /kg) Per Dr Barbaraann Cao

## 2023-05-11 NOTE — Patient Instructions (Signed)
 [facility] - A DEPT OF [facility]  Discharge Instructions: Thank you for choosing [facility] to provide your oncology and hematology care.   If you have a lab appointment with the Cancer Center, please go directly to the Cancer Center and check in at the registration area.   Wear comfortable clothing and clothing appropriate for easy access to any Portacath or PICC line.   We strive to give you quality time with your provider. You may need to reschedule your appointment if you arrive late (15 or more minutes).  Arriving late affects you and other patients whose appointments are after yours.  Also, if you miss three or more appointments without notifying the office, you may be dismissed from the clinic at the provider's discretion.      For prescription refill requests, have your pharmacy contact our office and allow 72 hours for refills to be completed.    Today you received the following chemotherapy and/or immunotherapy agents: MVASI     To help prevent nausea and vomiting after your treatment, we encourage you to take your nausea medication as directed.  BELOW ARE SYMPTOMS THAT SHOULD BE REPORTED IMMEDIATELY: *FEVER GREATER THAN 100.4 F (38 C) OR HIGHER *CHILLS OR SWEATING *NAUSEA AND VOMITING THAT IS NOT CONTROLLED WITH YOUR NAUSEA MEDICATION *UNUSUAL SHORTNESS OF BREATH *UNUSUAL BRUISING OR BLEEDING *URINARY PROBLEMS (pain or burning when urinating, or frequent urination) *BOWEL PROBLEMS (unusual diarrhea, constipation, pain near the anus) TENDERNESS IN MOUTH AND THROAT WITH OR WITHOUT PRESENCE OF ULCERS (sore throat, sores in mouth, or a toothache) UNUSUAL RASH, SWELLING OR PAIN  UNUSUAL VAGINAL DISCHARGE OR ITCHING   Items with * indicate a potential emergency and should be followed up as soon as possible or go to the Emergency Department if any problems should occur.  Please show the CHEMOTHERAPY ALERT CARD or IMMUNOTHERAPY ALERT  CARD at check-in to the Emergency Department and triage nurse.  Should you have questions after your visit or need to cancel or reschedule your appointment, please contact [facility] - A DEPT OF [facility]  Dept: [phone]  and follow the prompts.  Office hours are 8:00 a.m. to 4:30 p.m. Monday - Friday. Please note that voicemails left after 4:00 p.m. may not be returned until the following business day.  We are closed weekends and major holidays. You have access to a nurse at all times for urgent questions. Please call the main number to the clinic Dept: [phone] and follow the prompts.   For any non-urgent questions, you may also contact your provider using MyChart. We now offer e-Visits for anyone 109 and older to request care online for non-urgent symptoms. For details visit [email or web address].   Also download the MyChart app! Go to the app store, search "MyChart", open the app, select [place], and log in with your MyChart username and password.

## 2023-05-13 ENCOUNTER — Other Ambulatory Visit: Payer: Self-pay

## 2023-05-24 ENCOUNTER — Telehealth: Payer: Self-pay | Admitting: Internal Medicine

## 2023-05-24 NOTE — Telephone Encounter (Signed)
 The [facility] facility tried to reschedule Lindsey Daniels's appointments. I informed the scheduling coordinator that we have no space for this week. The coordinator stated that they will keep Lindsey Daniels's appointments scheduled as is.

## 2023-05-25 ENCOUNTER — Telehealth: Payer: Self-pay | Admitting: Internal Medicine

## 2023-05-25 ENCOUNTER — Telehealth: Payer: Self-pay | Admitting: *Deleted

## 2023-05-25 ENCOUNTER — Inpatient Hospital Stay

## 2023-05-25 ENCOUNTER — Inpatient Hospital Stay: Admitting: Internal Medicine

## 2023-05-25 ENCOUNTER — Other Ambulatory Visit: Payer: Medicare Other

## 2023-05-25 ENCOUNTER — Ambulatory Visit: Payer: Medicare Other | Admitting: Internal Medicine

## 2023-05-25 ENCOUNTER — Ambulatory Visit: Payer: Medicare Other

## 2023-05-25 NOTE — Telephone Encounter (Signed)
 I left the transportation coordinator Eber Jones a detailed message in regards to [facility] re scheduled appointments.

## 2023-05-25 NOTE — Telephone Encounter (Signed)
 Lindsey Daniels did not come to appointments today. Contacted Lindsey Daniels at [facility] [place] @ [phone] Lindsey Daniels's facility was not able to work out her transportation for today's appointments for lab, Lindsey Daniels and infusion. Lindsey Daniels informed and said she can be rescheduled next week. FYI --Per Lindsey Daniels at [facility] of [address] She said their best transportation options are Tuesdays and Thursdays due to other patients going to dialysis on M-W-F. Schedule message sent to r/s all appointments

## 2023-05-28 ENCOUNTER — Telehealth: Payer: Self-pay | Admitting: Internal Medicine

## 2023-05-28 NOTE — Telephone Encounter (Signed)
 I informed [address] of [place] that the requested reschedule has been completed for [facility] appointments on 4/17.

## 2023-06-03 ENCOUNTER — Encounter: Payer: Self-pay | Admitting: Internal Medicine

## 2023-06-03 ENCOUNTER — Inpatient Hospital Stay (HOSPITAL_BASED_OUTPATIENT_CLINIC_OR_DEPARTMENT_OTHER): Admitting: Internal Medicine

## 2023-06-03 ENCOUNTER — Ambulatory Visit

## 2023-06-03 ENCOUNTER — Inpatient Hospital Stay

## 2023-06-03 ENCOUNTER — Other Ambulatory Visit

## 2023-06-03 ENCOUNTER — Ambulatory Visit: Admitting: Internal Medicine

## 2023-06-03 ENCOUNTER — Inpatient Hospital Stay: Attending: Internal Medicine

## 2023-06-03 VITALS — BP 131/91 | HR 83 | Temp 97.4°F | Resp 18 | Ht 63.0 in | Wt 109.0 lb

## 2023-06-03 DIAGNOSIS — C719 Malignant neoplasm of brain, unspecified: Secondary | ICD-10-CM

## 2023-06-03 DIAGNOSIS — Y842 Radiological procedure and radiotherapy as the cause of abnormal reaction of the patient, or of later complication, without mention of misadventure at the time of the procedure: Secondary | ICD-10-CM

## 2023-06-03 DIAGNOSIS — G40909 Epilepsy, unspecified, not intractable, without status epilepticus: Secondary | ICD-10-CM | POA: Diagnosis not present

## 2023-06-03 DIAGNOSIS — Z5112 Encounter for antineoplastic immunotherapy: Secondary | ICD-10-CM | POA: Diagnosis present

## 2023-06-03 DIAGNOSIS — C712 Malignant neoplasm of temporal lobe: Secondary | ICD-10-CM | POA: Insufficient documentation

## 2023-06-03 DIAGNOSIS — I6789 Other cerebrovascular disease: Secondary | ICD-10-CM

## 2023-06-03 DIAGNOSIS — Z79899 Other long term (current) drug therapy: Secondary | ICD-10-CM | POA: Insufficient documentation

## 2023-06-03 LAB — CBC WITH DIFFERENTIAL (CANCER CENTER ONLY)
Abs Immature Granulocytes: 0 10*3/uL (ref 0.00–0.07)
Basophils Absolute: 0 10*3/uL (ref 0.0–0.1)
Basophils Relative: 1 %
Eosinophils Absolute: 0.2 10*3/uL (ref 0.0–0.5)
Eosinophils Relative: 5 %
HCT: 49.2 % — ABNORMAL HIGH (ref 36.0–46.0)
Hemoglobin: 16.8 g/dL — ABNORMAL HIGH (ref 12.0–15.0)
Immature Granulocytes: 0 %
Lymphocytes Relative: 50 %
Lymphs Abs: 2.5 10*3/uL (ref 0.7–4.0)
MCH: 29.6 pg (ref 26.0–34.0)
MCHC: 34.1 g/dL (ref 30.0–36.0)
MCV: 86.8 fL (ref 80.0–100.0)
Monocytes Absolute: 0.4 10*3/uL (ref 0.1–1.0)
Monocytes Relative: 8 %
Neutro Abs: 1.8 10*3/uL (ref 1.7–7.7)
Neutrophils Relative %: 36 %
Platelet Count: 204 10*3/uL (ref 150–400)
RBC: 5.67 MIL/uL — ABNORMAL HIGH (ref 3.87–5.11)
RDW: 14.2 % (ref 11.5–15.5)
WBC Count: 4.9 10*3/uL (ref 4.0–10.5)
nRBC: 0 % (ref 0.0–0.2)

## 2023-06-03 LAB — CMP (CANCER CENTER ONLY)
ALT: 6 U/L (ref 0–44)
AST: 28 U/L (ref 15–41)
Albumin: 4.1 g/dL (ref 3.5–5.0)
Alkaline Phosphatase: 94 U/L (ref 38–126)
Anion gap: 9 (ref 5–15)
BUN: 12 mg/dL (ref 8–23)
CO2: 27 mmol/L (ref 22–32)
Calcium: 9.8 mg/dL (ref 8.9–10.3)
Chloride: 102 mmol/L (ref 98–111)
Creatinine: 0.67 mg/dL (ref 0.44–1.00)
GFR, Estimated: >60 mL/min (ref >60)
Glucose, Bld: 101 mg/dL — ABNORMAL HIGH (ref 70–99)
Potassium: 3.8 mmol/L (ref 3.5–5.1)
Sodium: 138 mmol/L (ref 135–145)
Total Bilirubin: 0.6 mg/dL (ref 0.0–1.2)
Total Protein: 6.9 g/dL (ref 6.5–8.1)

## 2023-06-03 MED ORDER — SODIUM CHLORIDE 0.9 % IV SOLN
10.0000 mg/kg | Freq: Once | INTRAVENOUS | Status: AC
  Administered 2023-06-03: 500 mg via INTRAVENOUS
  Filled 2023-06-03: qty 16

## 2023-06-03 MED ORDER — SODIUM CHLORIDE 0.9 % IV SOLN: INTRAVENOUS | Status: DC

## 2023-06-03 NOTE — Progress Notes (Signed)
 Per Dr. Mark Sil - okay to proceed with treatment without urine protein today.

## 2023-06-03 NOTE — Patient Instructions (Signed)
 [facility]  Discharge Instructions: Thank you for choosing [facility] to provide your oncology and hematology care.   If you have a lab appointment with the Cancer Center, please go directly to the Cancer Center and check in at the registration area.   Wear comfortable clothing and clothing appropriate for easy access to any Portacath or PICC line.   We strive to give you quality time with your provider. You may need to reschedule your appointment if you arrive late (15 or more minutes).  Arriving late affects you and other patients whose appointments are after yours.  Also, if you miss three or more appointments without notifying the office, you may be dismissed from the clinic at the provider's discretion.      For prescription refill requests, have your pharmacy contact our office and allow 72 hours for refills to be completed.    Today you received the following chemotherapy and/or immunotherapy agents: bevacizumab      To help prevent nausea and vomiting after your treatment, we encourage you to take your nausea medication as directed.  BELOW ARE SYMPTOMS THAT SHOULD BE REPORTED IMMEDIATELY: *FEVER GREATER THAN 100.4 F (38 C) OR HIGHER *CHILLS OR SWEATING *NAUSEA AND VOMITING THAT IS NOT CONTROLLED WITH YOUR NAUSEA MEDICATION *UNUSUAL SHORTNESS OF BREATH *UNUSUAL BRUISING OR BLEEDING *URINARY PROBLEMS (pain or burning when urinating, or frequent urination) *BOWEL PROBLEMS (unusual diarrhea, constipation, pain near the anus) TENDERNESS IN MOUTH AND THROAT WITH OR WITHOUT PRESENCE OF ULCERS (sore throat, sores in mouth, or a toothache) UNUSUAL RASH, SWELLING OR PAIN  UNUSUAL VAGINAL DISCHARGE OR ITCHING   Items with * indicate a potential emergency and should be followed up as soon as possible or go to the Emergency Department if any problems should occur.  Please show the CHEMOTHERAPY ALERT CARD or  IMMUNOTHERAPY ALERT CARD at check-in to the Emergency Department and triage nurse.  Should you have questions after your visit or need to cancel or reschedule your appointment, please contact [facility]  Dept: [phone]  and follow the prompts.  Office hours are 8:00 a.m. to 4:30 p.m. Monday - Friday. Please note that voicemails left after 4:00 p.m. may not be returned until the following business day.  We are closed weekends and major holidays. You have access to a nurse at all times for urgent questions. Please call the main number to the clinic Dept: [phone] and follow the prompts.   For any non-urgent questions, you may also contact your provider using MyChart. We now offer e-Visits for anyone 81 and older to request care online for non-urgent symptoms. For details visit [email or web address].   Also download the MyChart app! Go to the app store, search "MyChart", open the app, select [facility], and log in with your MyChart username and password.

## 2023-06-03 NOTE — Progress Notes (Signed)
 [facility] [address]  [place], [place] [address] [phone]   Interval Evaluation  Date of Service: 06/03/23 Patient Name: Lindsey Daniels Patient MRN: [id number] Patient DOB: 07-09-1955 Provider: Henreitta Leber, MD  Identifying Statement:  Lindsey Daniels is a 68 y.o. female with right temporal  pleomorphic xanthoastrocytoma    Oncologic History: Oncology History  Pleomorphic xanthoastrocytoma of brain [facility])  09/29/2013 Surgery   Right temporal craniotomy, resection with Dr. Newell Coral.  Path is PXA WHO II.   11/29/2020 Surgery   Disease progression, second craniotomy with Dr. Franky Macho, sub-total resection.  Path is unchanged.   02/21/2021 - 04/02/2021 Radiation Therapy   IMRT radiation therapy with Dr. Mitzi Hansen   12/16/2021 Treatment Plan Change   Completes 4 cycles of avastin 10mg /kg for severe inflammation, poor tolerance of steroids   12/02/2022 Progression   Progression of Disease   01/11/2023 -  Chemotherapy   Initiates dual BRAF/MEK inhibition with dabrafenib+trametinib   02/15/2023 -  Chemotherapy   Patient is on Treatment Plan : BRAIN GBM Bevacizumab 14d x 6 cycles       Biomarkers:   Interval History: Lindsey Daniels presents today for avastin infusion, now cycle #8.  No clinical changes today.  She continues to reside in the skilled nursing facility, though per brother the rehab services have lapsed.  Mental status is stable from prior.  Remains non ambulatory due to significant left sided weakness.  No seizures in several months now.  She continues on combined BRAF/MEK inhibitory therapy, originally initaited 11/25.  She does complain of some pain in her left shoulder, hoarseness.  Prior: She describes twitching of the left side which persisted for more than 2 hours.  During admission Keppra was increased  to 1500mg  twice per day, decadron restarted 4mg  twice per day.  She remains quite weak on the left side compared to  prior, particularly with the arm.  She is walking minimally with the walker, mostly using the wheelchair.  Very high levels of irritability, mood swings noted at the facility.  She is confused at times, but falls have been avoided.  H+P (01/30/21) Patient presents today after recent re-resection for progressive right temporal PXA.  She initially presented with a seizure in 2015; imaging demonstrated enhancing right temporal lesion which was grossly resected by Dr. Newell Coral.  After last follow up in 2020, she began to experience headaches, ear fullness and some confusion this fall.  Imaging demonstrated marked progression of disease; she went for second surgery on 11/29/20.  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   Medications: Current Outpatient Medications on File Prior to Visit  Medication Sig Dispense Refill   acetaminophen (TYLENOL) 650 MG CR tablet Take 650 mg by mouth every 8 (eight) hours as needed for pain.     apixaban (ELIQUIS) 5 MG TABS tablet Take 1 tablet (5 mg total) by mouth 2 (two) times daily. 60 tablet 2   atorvastatin (LIPITOR) 20 MG tablet Take 1 tablet (20 mg total) by mouth daily. (Patient taking differently: Take 40 mg by mouth daily.) 90 tablet 3   cetirizine (ZYRTEC) 10 MG tablet Take 10 mg by mouth daily.     dabrafenib mesylate (TAFINLAR) 75 MG capsule Take 2 capsules (150 mg total) by mouth 2 (two) times daily. Take on an empty stomach 1 hour before or 2 hours after meals. 120 capsule  1   fluticasone (FLONASE) 50 MCG/ACT nasal spray Place 1-2 sprays into both nostrils daily. (Patient taking differently: Place 2 sprays into both nostrils daily.) 16 g 0   lamoTRIgine (LAMICTAL) 150 MG tablet Take 1 tablet (150 mg total) by mouth 2 (two) times daily.     levETIRAcetam (KEPPRA) 750 MG tablet Take 2 tablets (1,500 mg total) by mouth 2 (two) times daily. 120 tablet 1   metoprolol tartrate  (LOPRESSOR) 25 MG tablet Take 12.5 mg by mouth 2 (two) times daily.     Midazolam (NAYZILAM) 5 MG/0.1ML SOLN Place 5 mg into the nose every 6 (six) hours as needed (prolonged seizure, greater than 5 minutes). 1 each 0   Multiple Vitamin (MULTIVITAMIN WITH MINERALS) TABS tablet Take 1 tablet by mouth daily.     omeprazole (PRILOSEC) 20 MG capsule Take 20 mg by mouth daily.     ondansetron (ZOFRAN) 4 MG tablet Take 4 mg by mouth every 6 (six) hours as needed for nausea or vomiting.     potassium chloride SA (KLOR-CON M) 20 MEQ tablet Take 1 tablet (20 mEq total) by mouth 2 (two) times daily. 30 tablet 1   sertraline (ZOLOFT) 100 MG tablet Take 1 tablet (100 mg total) by mouth daily. 90 tablet 3   sertraline (ZOLOFT) 50 MG tablet Take 50 mg by mouth daily.     trametinib dimethyl sulfoxide (MEKINIST) 2 MG tablet Take 1 tablet (2 mg total) by mouth daily. Take 1 hour before or 2 hours after a meal. Store refrigerated in original container. 30 tablet 1   No current facility-administered medications on file prior to visit.    Allergies:  Allergies  Allergen Reactions   Rifapentine Other (See Comments)    Flu-like symptoms   Amoxicillin Rash   Clavulanic Acid Rash   Past Medical History:  Past Medical History:  Diagnosis Date   Allergy    Anal fissure    Anxiety    Cataract    Depression    Elevated LFTs    Hyperglycemia    Hyperlipidemia    Hypertension    Osteoarthritis    Pars defect of lumbar spine    Pleomorphic xanthoastrocytoma of temporal lobe (HCC)    Right   Seizures (HCC)    Spondylolisthesis    lumbar   Thrombocytopenia (HCC) 12/31/2022   Past Surgical History:  Past Surgical History:  Procedure Laterality Date   cataract     CRANIOTOMY N/A 10/02/2013   Procedure: 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;  Surgeon: Corrina Dimitri, MD;  Location: [facility];  Service: Neurosurgery;  Laterality: N/A;  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    CRANIOTOMY Right 11/29/2020   Procedure: Right Parietal craniotomy for tumor resection;  Surgeon: Audie Bleacher, MD;  Location: [facility] OR;  Service: Neurosurgery;  Laterality: Right;   HEEL SPUR SURGERY     Social History:  Social History   Socioeconomic History   Marital status: Divorced    Spouse name: separated-no papers file   Number of children: 0   Years of education: Not on file   Highest education level: Not on file  Occupational History   Occupation: RT    Employer: [employer] [place]    Comment: [employer] and [employer]  Tobacco Use   Smoking status: Never   Smokeless tobacco: Never  Substance and Sexual Activity   Alcohol use: Yes    Alcohol/week: 0.0 - 4.0 standard  drinks of alcohol    Comment: social   Drug use: No   Sexual activity: Not Currently    Partners: Male  Other Topics Concern   Not on file  Social History Narrative   Separated from second husband.  Very contentious split.   Social Drivers of Corporate investment banker Strain: Low Risk  (01/15/2022)   Overall Financial Resource Strain (CARDIA)    Difficulty of Paying Living Expenses: Not hard at all  Food Insecurity: Patient Unable To Answer (12/31/2022)   Hunger Vital Sign    Worried About Programme researcher, broadcasting/film/video in the Last Year: Patient unable to answer    Ran Out of Food in the Last Year: Patient unable to answer  Transportation Needs: Patient Unable To Answer (12/31/2022)   PRAPARE - Transportation    Lack of Transportation (Medical): Patient unable to answer    Lack of Transportation (Non-Medical): Patient unable to answer  Physical Activity: Inactive (02/03/2021)   [facility]   Exercise Vital Sign    Days of Exercise per Week: 0 days    Minutes of Exercise per Session: 0 min  Stress: Stress Concern Present (02/03/2021)   Received from [facility] - Occupational Stress  Questionnaire    Feeling of Stress : To some extent  Social Connections: Unknown (06/16/2021)   Received from [facility]   Social Network    Social Network: Not on file  Intimate Partner Violence: Patient Unable To Answer (12/31/2022)   Humiliation, Afraid, Rape, and Kick questionnaire    Fear of Current or Ex-Partner: Patient unable to answer    Emotionally Abused: Patient unable to answer    Physically Abused: Patient unable to answer    Sexually Abused: Patient unable to answer   Family History:  Family History  Problem Relation Age of Onset   Mental illness Father        depression   COPD Father    Alcohol abuse Brother    Breast cancer Neg Hx     Review of Systems: Constitutional: Doesn't report fevers, chills or abnormal weight loss Eyes: Doesn't report blurriness of vision Ears, nose, mouth, throat, and face: Doesn't report sore throat Respiratory: +dyspnea Cardiovascular: Doesn't report palpitation, chest discomfort  Gastrointestinal:  Doesn't report nausea, constipation, diarrhea GU: Doesn't report incontinence Skin: Doesn't report skin rashes Neurological: Per HPI Musculoskeletal: Doesn't report joint pain Behavioral/Psych: Doesn't report anxiety  Physical Exam: Vitals:   06/03/23 1450  BP: (!) 131/91  Pulse: 83  Resp: 18  Temp: (!) 97.4 F (36.3 C)  SpO2: 97%    KPS: 60. General: laying down, eyes closed Head: Normal EENT: No conjunctival injection or scleral icterus.  Lungs: Normal Cardiac: Regular rate Abdomen: Non-distended abdomen Skin: +purpura, arms and legs Extremities: +edema  Neurologic Exam: Mental Status: Drowsy, alert, attentive to examiner with tactile stimulation. Oriented to self and environment when attentive. Language is fluent with intact comprehension.  Displays some disorganized thinking. Age advanced psychomotor slowing. Cranial Nerves: Visual acuity is grossly normal. Visual fields are full. Extra-ocular  movements intact. No ptosis. Face is symmetric Motor: Tone and bulk are normal. Power is 2/5 in left arm, 4/5 in left leg. Reflexes are symmetric, no pathologic reflexes present.  Sensory: Intact to light touch Gait: Non ambulatory   Labs: I have reviewed the data as listed    Component Value Date/Time   NA 138 05/11/2023 1209  NA 142 06/30/2017 1815   K 4.2 05/11/2023 1209   CL 102 05/11/2023 1209   CO2 26 05/11/2023 1209   GLUCOSE 111 (H) 05/11/2023 1209   BUN 14 05/11/2023 1209   BUN 14 06/30/2017 1815   CREATININE 0.63 05/11/2023 1209   CREATININE 0.72 06/07/2015 0825   CALCIUM 10.4 (H) 05/11/2023 1209   PROT 7.5 05/11/2023 1209   PROT 7.0 06/30/2017 1815   ALBUMIN 4.4 05/11/2023 1209   ALBUMIN 4.6 06/30/2017 1815   AST 19 05/11/2023 1209   ALT 5 05/11/2023 1209   ALKPHOS 108 05/11/2023 1209   BILITOT 1.0 05/11/2023 1209   GFRNONAA >60 05/11/2023 1209   GFRAA  01/18/2022 0405    UNSATISFACTORY SPECIMEN. CLIENT REQUESTS SPECIMEN TO BE ANALYZED.   Lab Results  Component Value Date   WBC 5.0 05/11/2023   NEUTROABS 2.8 05/11/2023   HGB 16.3 (H) 05/11/2023   HCT 49.6 (H) 05/11/2023   MCV 88.7 05/11/2023   PLT 256 05/11/2023    Assessment/Plan Pleomorphic xanthoastrocytoma of brain [facility])  Seizure disorder (HCC)  Radiation therapy induced brain necrosis  Lindsey Daniels is clinically stable today.  Labs are again within normal limits.  If MRI upcoming demonstrates stable findings, can push avastin out to q4 weeks if tolerated.  Also discussed possibly reducing Keppra again.  She is cleared for cycle #8 avastin today.   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   -Repeat brain MRI after cycle #8 -Continue physical therapy at nursing facility -Con't Keppra 1500mg  BID, Lamictal 150mg  BID  We ask that Lindsey Plate  Daniels return to clinic in 2 weeks for MRI brain review.  All questions were answered. The patient knows to call the clinic with any problems, questions or concerns. No barriers to learning were detected.  The total time spent in the encounter was 30 minutes and more than 50% was on counseling and review of test results   Lindsey Searles, MD Medical Director of Neuro-Oncology [facility] at [place] 06/03/23 2:49 PM

## 2023-06-04 ENCOUNTER — Telehealth: Payer: Self-pay | Admitting: Internal Medicine

## 2023-06-04 NOTE — Telephone Encounter (Signed)
 I spoke with Psychologist, educational at [facility], Maurie Southern tried to contact [employer] to schedule, but he was unavailable. Maurie Southern confirmed the appointment scheduled is good to go.

## 2023-06-05 ENCOUNTER — Other Ambulatory Visit: Payer: Self-pay

## 2023-06-08 ENCOUNTER — Other Ambulatory Visit

## 2023-06-08 ENCOUNTER — Ambulatory Visit

## 2023-06-08 ENCOUNTER — Ambulatory Visit: Admitting: Internal Medicine

## 2023-06-11 ENCOUNTER — Ambulatory Visit (HOSPITAL_COMMUNITY)
Admission: RE | Admit: 2023-06-11 | Discharge: 2023-06-11 | Disposition: A | Discharge status: Home / Self Care | Source: Ambulatory Visit | Attending: Internal Medicine | Admitting: Internal Medicine

## 2023-06-11 DIAGNOSIS — C719 Malignant neoplasm of brain, unspecified: Secondary | ICD-10-CM | POA: Insufficient documentation

## 2023-06-11 MED ORDER — GADOBUTROL 1 MMOL/ML IV SOLN
5.0000 mL | Freq: Once | INTRAVENOUS | Status: AC | PRN
  Administered 2023-06-11: 5 mL via INTRAVENOUS

## 2023-06-15 ENCOUNTER — Inpatient Hospital Stay (HOSPITAL_BASED_OUTPATIENT_CLINIC_OR_DEPARTMENT_OTHER): Admitting: Internal Medicine

## 2023-06-15 ENCOUNTER — Encounter: Payer: Self-pay | Admitting: Internal Medicine

## 2023-06-15 VITALS — BP 134/94 | HR 113 | Temp 97.6°F | Resp 17

## 2023-06-15 DIAGNOSIS — G40909 Epilepsy, unspecified, not intractable, without status epilepticus: Secondary | ICD-10-CM | POA: Diagnosis not present

## 2023-06-15 DIAGNOSIS — Y842 Radiological procedure and radiotherapy as the cause of abnormal reaction of the patient, or of later complication, without mention of misadventure at the time of the procedure: Secondary | ICD-10-CM

## 2023-06-15 DIAGNOSIS — Z5112 Encounter for antineoplastic immunotherapy: Secondary | ICD-10-CM | POA: Diagnosis not present

## 2023-06-15 DIAGNOSIS — I6789 Other cerebrovascular disease: Secondary | ICD-10-CM | POA: Diagnosis not present

## 2023-06-15 DIAGNOSIS — C719 Malignant neoplasm of brain, unspecified: Secondary | ICD-10-CM | POA: Diagnosis not present

## 2023-06-15 MED ORDER — LEVETIRACETAM 1000 MG PO TABS: 1000.0000 mg | ORAL_TABLET | Freq: Two times a day (BID) | ORAL | Status: DC

## 2023-06-15 NOTE — Progress Notes (Signed)
 [facility] [address]  [place], [place] [address] [phone]   Interval Evaluation  Date of Service: 06/15/23 Patient Name: Lindsey Daniels Patient MRN: [id number] Patient DOB: 05/14/1955 Provider: Mamie Searles, MD  Identifying Statement:  Lindsey Daniels is a 68 y.o. female with right temporal  pleomorphic xanthoastrocytoma    Oncologic History: Oncology History  Pleomorphic xanthoastrocytoma of brain [facility])  09/29/2013 Surgery   Right temporal craniotomy, resection with Dr. Cipriano Creeks.  Path is PXA WHO II.   11/29/2020 Surgery   Disease progression, second craniotomy with Dr. Michale Age, sub-total resection.  Path is unchanged.   02/21/2021 - 04/02/2021 Radiation Therapy   IMRT radiation therapy with Dr. Jeryl Moris   12/16/2021 Treatment Plan Change   Completes 4 cycles of avastin  10mg /kg for severe inflammation, poor tolerance of steroids   12/02/2022 Progression   Progression of Disease   01/11/2023 -  Chemotherapy   Initiates dual BRAF/MEK inhibition with dabrafenib+trametinib    02/15/2023 -  Chemotherapy   Patient is on Treatment Plan : BRAIN GBM Bevacizumab  14d x 6 cycles       Biomarkers:   Interval History: Lindsey Daniels presents today following recent MRI brain.  No new or progressive deficits.  She continues to reside in the skilled nursing facility.  Mental status is stable from prior.  Remains non ambulatory due to significant left sided weakness.  No seizures in several months now.  She continues on combined BRAF/MEK inhibitory therapy, originally initaited 11/25.  She does complain of some pain in her left shoulder, hoarseness.  Prior: She describes twitching of the left side which persisted for more than 2 hours.  During admission Keppra  was increased  to 1500mg  twice per day, decadron  restarted 4mg  twice per day.  She remains quite weak on the left side compared to prior, particularly with the arm.  She is walking  minimally with the walker, mostly using the wheelchair.  Very high levels of irritability, mood swings noted at the facility.  She is confused at times, but falls have been avoided.  H+P (01/30/21) Patient presents today after recent re-resection for progressive right temporal PXA.  She initially presented with a seizure in 2015; imaging demonstrated enhancing right temporal lesion which was grossly resected by Dr. Cipriano Creeks.  After last follow up in 2020, she began to experience headaches, ear fullness and some confusion this fall.  Imaging demonstrated marked progression of disease; she went for second surgery on 11/29/20.  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   Medications: Current Outpatient Medications on File Prior to Visit  Medication Sig Dispense Refill   acetaminophen  (TYLENOL ) 650 MG CR tablet Take 650 mg by mouth every 8 (eight) hours as needed for pain.     apixaban  (ELIQUIS ) 5 MG TABS tablet Take 1 tablet (5 mg total) by mouth 2 (two) times daily. 60 tablet 2   atorvastatin  (LIPITOR) 20 MG tablet Take 1 tablet (20 mg total) by mouth daily. (Patient taking differently: Take 40 mg by mouth daily.) 90 tablet 3   cetirizine  (ZYRTEC ) 10 MG tablet Take 10 mg by mouth daily.     dabrafenib mesylate  (TAFINLAR ) 75 MG capsule Take 2 capsules (150 mg total) by mouth 2 (two) times daily. Take on an empty stomach 1 hour before or 2 hours after meals. 120 capsule 1   fluticasone  (FLONASE ) 50 MCG/ACT nasal spray  Place 1-2 sprays into both nostrils daily. (Patient taking differently: Place 2 sprays into both nostrils daily.) 16 g 0   lamoTRIgine  (LAMICTAL ) 150 MG tablet Take 1 tablet (150 mg total) by mouth 2 (two) times daily.     levETIRAcetam  (KEPPRA ) 750 MG tablet Take 2 tablets (1,500 mg total) by mouth 2 (two) times daily. 120 tablet 1   metoprolol  tartrate (LOPRESSOR ) 25 MG tablet Take 12.5 mg by mouth 2 (two) times  daily.     Midazolam  (NAYZILAM ) 5 MG/0.1ML SOLN Place 5 mg into the nose every 6 (six) hours as needed (prolonged seizure, greater than 5 minutes). 1 each 0   Multiple Vitamin (MULTIVITAMIN WITH MINERALS) TABS tablet Take 1 tablet by mouth daily.     omeprazole  (PRILOSEC) 20 MG capsule Take 20 mg by mouth daily.     ondansetron  (ZOFRAN ) 4 MG tablet Take 4 mg by mouth every 6 (six) hours as needed for nausea or vomiting.     potassium chloride  SA (KLOR-CON  M) 20 MEQ tablet Take 1 tablet (20 mEq total) by mouth 2 (two) times daily. 30 tablet 1   sertraline  (ZOLOFT ) 100 MG tablet Take 1 tablet (100 mg total) by mouth daily. 90 tablet 3   sertraline  (ZOLOFT ) 50 MG tablet Take 50 mg by mouth daily.     trametinib  dimethyl sulfoxide  (MEKINIST ) 2 MG tablet Take 1 tablet (2 mg total) by mouth daily. Take 1 hour before or 2 hours after a meal. Store refrigerated in original container. 30 tablet 1   No current facility-administered medications on file prior to visit.    Allergies:  Allergies  Allergen Reactions   Rifapentine Other (See Comments) and Nausea And Vomiting    Flu-like symptoms  rifapentine   Amoxicillin Rash   Clavulanic Acid Rash   Past Medical History:  Past Medical History:  Diagnosis Date   Allergy    Anal fissure    Anxiety    Cataract    Depression    Elevated LFTs    Hyperglycemia    Hyperlipidemia    Hypertension    Osteoarthritis    Pars defect of lumbar spine    Pleomorphic xanthoastrocytoma of temporal lobe (HCC)    Right   Seizures (HCC)    Spondylolisthesis    lumbar   Thrombocytopenia (HCC) 12/31/2022   Past Surgical History:  Past Surgical History:  Procedure Laterality Date   cataract     CRANIOTOMY N/A 10/02/2013   Procedure: 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;  Surgeon: Corrina Dimitri, MD;  Location: [facility];  Service: Neurosurgery;  Laterality: N/A;  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   CRANIOTOMY Right 11/29/2020    Procedure: Right Parietal craniotomy for tumor resection;  Surgeon: Audie Bleacher, MD;  Location: [facility] OR;  Service: Neurosurgery;  Laterality: Right;   HEEL SPUR SURGERY     Social History:  Social History   Socioeconomic History   Marital status: Divorced    Spouse name: separated-no papers file   Number of children: 0   Years of education: Not on file   Highest education level: Not on file  Occupational History   Occupation: RT    Employer: [employer]    Comment: [employer] and [employer]  Tobacco Use   Smoking status: Never   Smokeless tobacco: Never  Substance and Sexual Activity   Alcohol use: Yes    Alcohol/week: 0.0 - 4.0 standard drinks of alcohol  Comment: social   Drug use: No   Sexual activity: Not Currently    Partners: Male  Other Topics Concern   Not on file  Social History Narrative   Separated from second husband.  Very contentious split.   Social Drivers of Corporate investment banker Strain: Low Risk  (01/15/2022)   Overall Financial Resource Strain (CARDIA)    Difficulty of Paying Living Expenses: Not hard at all  Food Insecurity: Patient Unable To Answer (12/31/2022)   Hunger Vital Sign    Worried About Programme researcher, broadcasting/film/video in the Last Year: Patient unable to answer    Ran Out of Food in the Last Year: Patient unable to answer  Transportation Needs: Patient Unable To Answer (12/31/2022)   PRAPARE - Transportation    Lack of Transportation (Medical): Patient unable to answer    Lack of Transportation (Non-Medical): Patient unable to answer  Physical Activity: Inactive (02/03/2021)   [facility] from [facility], [facility]   Exercise Vital Sign    Days of Exercise per Week: 0 days    Minutes of Exercise per Session: 0 min  Stress: Stress Concern Present (02/03/2021)   Received from [facility], [facility] - Occupational Stress Questionnaire    Feeling of  Stress : To some extent  Social Connections: Unknown (06/16/2021)   Received from [facility], [facility]   Social Network    Social Network: Not on file  Intimate Partner Violence: Patient Unable To Answer (12/31/2022)   Humiliation, Afraid, Rape, and Kick questionnaire    Fear of Current or Ex-Partner: Patient unable to answer    Emotionally Abused: Patient unable to answer    Physically Abused: Patient unable to answer    Sexually Abused: Patient unable to answer   Family History:  Family History  Problem Relation Age of Onset   Mental illness Father        depression   COPD Father    Alcohol abuse Brother    Breast cancer Neg Hx     Review of Systems: Constitutional: Doesn't report fevers, chills or abnormal weight loss Eyes: Doesn't report blurriness of vision Ears, nose, mouth, throat, and face: Doesn't report sore throat Respiratory: +dyspnea Cardiovascular: Doesn't report palpitation, chest discomfort  Gastrointestinal:  Doesn't report nausea, constipation, diarrhea GU: Doesn't report incontinence Skin: Doesn't report skin rashes Neurological: Per HPI Musculoskeletal: Doesn't report joint pain Behavioral/Psych: Doesn't report anxiety  Physical Exam: Vitals:   06/15/23 1125 06/15/23 1126  BP: (!) 140/108 (!) 134/94  Pulse: (!) 113   Resp: 17   Temp: 97.6 F (36.4 C)   SpO2: 96%     KPS: 60. General: laying down, eyes closed Head: Normal EENT: No conjunctival injection or scleral icterus.  Lungs: Normal Cardiac: Regular rate Abdomen: Non-distended abdomen Skin: +purpura, arms and legs Extremities: +edema  Neurologic Exam: Mental Status: Drowsy, alert, attentive to examiner with tactile stimulation. Oriented to self and environment when attentive. Language is fluent with intact comprehension.  Displays some disorganized thinking. Age advanced psychomotor slowing. Cranial Nerves: Visual acuity is grossly normal. Visual fields are full. Extra-ocular  movements intact. No ptosis. Face is symmetric Motor: Tone and bulk are normal. Power is 2/5 in left arm, 4/5 in left leg. Reflexes are symmetric, no pathologic reflexes present.  Sensory: Intact to light touch Gait: Non ambulatory   Labs: I have reviewed the data as listed    Component Value Date/Time   NA 138  06/03/2023 1441   NA 142 06/30/2017 1815   K 3.8 06/03/2023 1441   CL 102 06/03/2023 1441   CO2 27 06/03/2023 1441   GLUCOSE 101 (H) 06/03/2023 1441   BUN 12 06/03/2023 1441   BUN 14 06/30/2017 1815   CREATININE 0.67 06/03/2023 1441   CREATININE 0.72 06/07/2015 0825   CALCIUM  9.8 06/03/2023 1441   PROT 6.9 06/03/2023 1441   PROT 7.0 06/30/2017 1815   ALBUMIN 4.1 06/03/2023 1441   ALBUMIN 4.6 06/30/2017 1815   AST 28 06/03/2023 1441   ALT 6 06/03/2023 1441   ALKPHOS 94 06/03/2023 1441   BILITOT 0.6 06/03/2023 1441   GFRNONAA >60 06/03/2023 1441   GFRAA  01/18/2022 0405    UNSATISFACTORY SPECIMEN. CLIENT REQUESTS SPECIMEN TO BE ANALYZED.   Lab Results  Component Value Date   WBC 4.9 06/03/2023   NEUTROABS 1.8 06/03/2023   HGB 16.8 (H) 06/03/2023   HCT 49.2 (H) 06/03/2023   MCV 86.8 06/03/2023   PLT 204 06/03/2023   Imaging:  [facility] Clinician Interpretation: I have personally reviewed the CNS images as listed.  My interpretation, in the context of the patient's clinical presentation, is stable disease  MR BRAIN W WO CONTRAST Result Date: 06/15/2023 CLINICAL DATA:  68 year old female pleomorphic astrocytoma status post surgery, IMRT, chemotherapy. Restaging. EXAM: MRI HEAD WITHOUT AND WITH CONTRAST TECHNIQUE: Multiplanar, multiecho pulse sequences of the brain and surrounding structures were obtained without and with intravenous contrast. CONTRAST:  5mL GADAVIST  GADOBUTROL  1 MMOL/ML IV SOLN COMPARISON:  Brain MRI 04/08/2023 and earlier. FINDINGS: Brain: Confluent T2 and FLAIR hyperintensity in the right hemisphere from the superior perirolandic area tracking through  the right parietal lobe into the right temporal lobe. Associated hemosiderin is stable. Involvement of the right deep white matter capsules and right cerebellar peduncle appears to be stable. No evidence of increased regional mass effect. No gyral enlargement compared to February. On DWI nodular right lateral ventricle ependymal restriction is increased in conspicuity since last month, new compared to December, although similar diffusion heterogeneity along the atrium and occipital horn of the right lateral ventricle has been present since last year. That diffusion signal also partially restricted, and stable since February. Following contrast petechial and ependymal, curvilinear enhancement primarily from the atrium of the lateral ventricle into the right temporal lobe is stable since last month, decreased compared to November (December postcontrast imaging degraded by motion). On minority of this is intrinsic T1 hyperintensity. No new posterior fossa or contralateral left hemisphere signal abnormality or enhancement is identified. No restricted diffusion suggestive of acute infarction. No midline shift, acute intracranial hemorrhage. Cervicomedullary junction and pituitary are within normal limits. No suspicious dural thickening. Vascular: Major intracranial vascular flow voids are stable. Following contrast the major dural venous sinuses are enhancing and appear to be patent. Skull and upper cervical spine: Post craniotomy changes on the right. Negative visible cervical spine and spinal cord. Normal background bone marrow signal. Sinuses/Orbits: Stable. Other: Visible internal auditory structures appear normal. IMPRESSION: Largely stable post treatment appearance of the Brain since February, although the nodular ependymal diffusion restriction along the body of the right lateral ventricle is more conspicuous, most compatible with hypercellularity there. But stable T2/FLAIR and Postcontrast enhancement in the  treatment area with no regional mass effect. Electronically Signed   By: Marlise Simpers M.D.   On: 06/15/2023 10:45    Assessment/Plan Pleomorphic xanthoastrocytoma of brain [facility])  Seizure disorder (HCC)  Deadra Garzon is clinically stable today. MRI brain  demonstrates ongoing stability.  Slight increase in restricuted diffusion could be attributed to avastin  effect.    We discussed pushing avastin  out to q4 weeks if tolerated.  Also discussed possibly reducing Keppra  again.  Cycle #9 will be in 2 weeks rather than today.  Recommended the following: -Continue BRAF/MEK inhibition with Dabrafenib and Trametinib  -Reduce avastin  to 10mg /kg IV q4 weeks cycles given ongoing burden of inflammation, suspected refractory RN.   -Repeat brain MRI in 3 months -Continue physical therapy at nursing facility -Con't Lamictal  150mg  BID, reduce Keppra  to 1000mg  BID  We ask that Laraine Plate Harral return to clinic in 2 weeks for next avastin  treatment.  All questions were answered. The patient knows to call the clinic with any problems, questions or concerns. No barriers to learning were detected.  The total time spent in the encounter was 40 minutes and more than 50% was on counseling and review of test results   Mamie Searles, MD Medical Director of Neuro-Oncology [facility] 06/15/23 11:30 AM

## 2023-06-17 ENCOUNTER — Telehealth: Payer: Self-pay | Admitting: Internal Medicine

## 2023-06-17 ENCOUNTER — Other Ambulatory Visit: Payer: Self-pay

## 2023-06-17 NOTE — Telephone Encounter (Signed)
 I informed [facility] and [facility] Herb of Eryca's appointments. They are all acceptable to the appointment times provided.

## 2023-06-18 ENCOUNTER — Other Ambulatory Visit: Payer: Self-pay

## 2023-06-23 ENCOUNTER — Other Ambulatory Visit: Payer: Self-pay

## 2023-06-28 ENCOUNTER — Other Ambulatory Visit: Payer: Self-pay

## 2023-07-01 ENCOUNTER — Inpatient Hospital Stay

## 2023-07-01 ENCOUNTER — Inpatient Hospital Stay: Attending: Internal Medicine | Admitting: Internal Medicine

## 2023-07-01 ENCOUNTER — Inpatient Hospital Stay: Attending: Internal Medicine

## 2023-07-01 VITALS — BP 112/89 | HR 115 | Temp 98.0°F | Resp 18

## 2023-07-01 DIAGNOSIS — I6789 Other cerebrovascular disease: Secondary | ICD-10-CM

## 2023-07-01 DIAGNOSIS — C712 Malignant neoplasm of temporal lobe: Secondary | ICD-10-CM | POA: Insufficient documentation

## 2023-07-01 DIAGNOSIS — C719 Malignant neoplasm of brain, unspecified: Secondary | ICD-10-CM | POA: Diagnosis not present

## 2023-07-01 DIAGNOSIS — Z79899 Other long term (current) drug therapy: Secondary | ICD-10-CM | POA: Insufficient documentation

## 2023-07-01 LAB — CMP (CANCER CENTER ONLY)
ALT: 7 U/L (ref 0–44)
AST: 22 U/L (ref 15–41)
Albumin: 4 g/dL (ref 3.5–5.0)
Alkaline Phosphatase: 98 U/L (ref 38–126)
Anion gap: 7 (ref 5–15)
BUN: 7 mg/dL — ABNORMAL LOW (ref 8–23)
CO2: 29 mmol/L (ref 22–32)
Calcium: 9.7 mg/dL (ref 8.9–10.3)
Chloride: 102 mmol/L (ref 98–111)
Creatinine: 0.53 mg/dL (ref 0.44–1.00)
GFR, Estimated: >60 mL/min (ref >60)
Glucose, Bld: 107 mg/dL — ABNORMAL HIGH (ref 70–99)
Potassium: 3.6 mmol/L (ref 3.5–5.1)
Sodium: 138 mmol/L (ref 135–145)
Total Bilirubin: 0.6 mg/dL (ref 0.0–1.2)
Total Protein: 6.7 g/dL (ref 6.5–8.1)

## 2023-07-01 LAB — CBC WITH DIFFERENTIAL (CANCER CENTER ONLY)
Abs Immature Granulocytes: 0 10*3/uL (ref 0.00–0.07)
Basophils Absolute: 0.1 10*3/uL (ref 0.0–0.1)
Basophils Relative: 1 %
Eosinophils Absolute: 0.1 10*3/uL (ref 0.0–0.5)
Eosinophils Relative: 2 %
HCT: 47.1 % — ABNORMAL HIGH (ref 36.0–46.0)
Hemoglobin: 15.5 g/dL — ABNORMAL HIGH (ref 12.0–15.0)
Immature Granulocytes: 0 %
Lymphocytes Relative: 46 %
Lymphs Abs: 2.5 10*3/uL (ref 0.7–4.0)
MCH: 28.5 pg (ref 26.0–34.0)
MCHC: 32.9 g/dL (ref 30.0–36.0)
MCV: 86.7 fL (ref 80.0–100.0)
Monocytes Absolute: 0.5 10*3/uL (ref 0.1–1.0)
Monocytes Relative: 9 %
Neutro Abs: 2.3 10*3/uL (ref 1.7–7.7)
Neutrophils Relative %: 42 %
Platelet Count: 253 10*3/uL (ref 150–400)
RBC: 5.43 MIL/uL — ABNORMAL HIGH (ref 3.87–5.11)
RDW: 14.6 % (ref 11.5–15.5)
WBC Count: 5.5 10*3/uL (ref 4.0–10.5)
nRBC: 0 % (ref 0.0–0.2)

## 2023-07-01 NOTE — Progress Notes (Signed)
 [facility] at [facility] [address]  [place], [place] [address] [phone]   Interval Evaluation  Date of Service: 07/01/23 Patient Name: Lindsey Daniels Patient MRN: [id number] Patient DOB: 01-19-1956 Provider: Mamie Searles, MD  Identifying Statement:  Lindsey Daniels is a 68 y.o. female with right temporal pleomorphic xanthoastrocytoma   Oncologic History: Oncology History  Pleomorphic xanthoastrocytoma of brain [facility])  09/29/2013 Surgery   Right temporal craniotomy, resection with Dr. Cipriano Creeks.  Path is PXA WHO II.   11/29/2020 Surgery   Disease progression, second craniotomy with Dr. Michale Age, sub-total resection.  Path is unchanged.   02/21/2021 - 04/02/2021 Radiation Therapy   IMRT radiation therapy with Dr. Jeryl Moris   12/16/2021 Treatment Plan Change   Completes 4 cycles of avastin  10mg /kg for severe inflammation, poor tolerance of steroids   12/02/2022 Progression   Progression of Disease   01/11/2023 -  Chemotherapy   Initiates dual BRAF/MEK inhibition with dabrafenib+trametinib    02/15/2023 -  Chemotherapy   Patient is on Treatment Plan : BRAIN GBM Bevacizumab  14d x 6 cycles       Biomarkers:   Interval History: Lindsey Daniels presents today for planned avastin  infusion.  Today she describes worsening nausea, lethargy.  Had an episode of confusion at the skilled nursing facility which describes as "felt like a seizure".  Mental status is otherwise stable from prior.  Remains non ambulatory due to significant left sided weakness.  No seizures in several months now.  She continues on combined BRAF/MEK inhibitory therapy, originally initaited 11/25.  Keppra  remains at 1000mg  BID, Lamictal  150mg  BID.  She does complain of some pain in her left shoulder, hoarseness.  Prior: She describes twitching of the left side which persisted for more than 2 hours.  During admission Keppra  was increased  to 1500mg  twice per day, decadron  restarted 4mg   twice per day.  She remains quite weak on the left side compared to prior, particularly with the arm.  She is walking minimally with the walker, mostly using the wheelchair.  Very high levels of irritability, mood swings noted at the facility.  She is confused at times, but falls have been avoided.  H+P (01/30/21) Patient presents today after recent re-resection for progressive right temporal PXA.  She initially presented with a seizure in 2015; imaging demonstrated enhancing right temporal lesion which was grossly resected by Dr. Cipriano Creeks.  After last follow up in 2020, she began to experience headaches, ear fullness and some confusion this fall.  Imaging demonstrated marked progression of disease; she went for second surgery on 11/29/20.  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   Medications: Current Outpatient Medications on File Prior to Visit  Medication Sig Dispense Refill   acetaminophen  (TYLENOL ) 650 MG CR tablet Take 650 mg by mouth every 8 (eight) hours as needed for pain.     apixaban  (ELIQUIS ) 5 MG TABS tablet Take 1 tablet (5 mg total) by mouth 2 (two) times daily. 60 tablet 2   atorvastatin  (LIPITOR) 20 MG tablet Take 1 tablet (20 mg total) by mouth daily. (Patient taking differently: Take 40 mg by mouth daily.) 90 tablet 3   cetirizine  (ZYRTEC ) 10 MG tablet Take 10 mg by mouth daily.     dabrafenib mesylate  (TAFINLAR ) 75 MG capsule Take 2 capsules (150 mg total) by mouth 2 (two) times daily. Take on an empty stomach 1 hour  before or 2 hours after meals. 120 capsule 1   fluticasone  (FLONASE ) 50 MCG/ACT nasal spray Place 1-2 sprays into both nostrils daily. (Patient taking differently: Place 2 sprays into both nostrils daily.) 16 g 0   lamoTRIgine  (LAMICTAL ) 150 MG tablet Take 1 tablet (150 mg total) by mouth 2 (two) times daily.     levETIRAcetam  (KEPPRA ) 1000 MG tablet Take 1 tablet (1,000 mg total) by mouth  2 (two) times daily.     metoprolol  tartrate (LOPRESSOR ) 25 MG tablet Take 12.5 mg by mouth 2 (two) times daily.     Midazolam  (NAYZILAM ) 5 MG/0.1ML SOLN Place 5 mg into the nose every 6 (six) hours as needed (prolonged seizure, greater than 5 minutes). 1 each 0   Multiple Vitamin (MULTIVITAMIN WITH MINERALS) TABS tablet Take 1 tablet by mouth daily.     omeprazole  (PRILOSEC) 20 MG capsule Take 20 mg by mouth daily.     ondansetron  (ZOFRAN ) 4 MG tablet Take 4 mg by mouth every 6 (six) hours as needed for nausea or vomiting.     potassium chloride  SA (KLOR-CON  M) 20 MEQ tablet Take 1 tablet (20 mEq total) by mouth 2 (two) times daily. 30 tablet 1   sertraline  (ZOLOFT ) 100 MG tablet Take 1 tablet (100 mg total) by mouth daily. 90 tablet 3   sertraline  (ZOLOFT ) 50 MG tablet Take 50 mg by mouth daily.     trametinib  dimethyl sulfoxide  (MEKINIST ) 2 MG tablet Take 1 tablet (2 mg total) by mouth daily. Take 1 hour before or 2 hours after a meal. Store refrigerated in original container. 30 tablet 1   No current facility-administered medications on file prior to visit.    Allergies:  Allergies  Allergen Reactions   Rifapentine Other (See Comments) and Nausea And Vomiting    Flu-like symptoms  rifapentine   Amoxicillin Rash   Clavulanic Acid Rash   Past Medical History:  Past Medical History:  Diagnosis Date   Allergy    Anal fissure    Anxiety    Cataract    Depression    Elevated LFTs    Hyperglycemia    Hyperlipidemia    Hypertension    Osteoarthritis    Pars defect of lumbar spine    Pleomorphic xanthoastrocytoma of temporal lobe (HCC)    Right   Seizures (HCC)    Spondylolisthesis    lumbar   Thrombocytopenia (HCC) 12/31/2022   Past Surgical History:  Past Surgical History:  Procedure Laterality Date   cataract     CRANIOTOMY N/A 10/02/2013   Procedure: 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;  Surgeon: Corrina Dimitri, MD;  Location: [facility];  Service:  Neurosurgery;  Laterality: N/A;  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   CRANIOTOMY Right 11/29/2020   Procedure: Right Parietal craniotomy for tumor resection;  Surgeon: Audie Bleacher, MD;  Location: [facility];  Service: Neurosurgery;  Laterality: Right;   HEEL SPUR SURGERY     Social History:  Social History   Socioeconomic History   Marital status: Divorced    Spouse name: separated-no papers file   Number of children: 0   Years of education: Not on file   Highest education level: Not on file  Occupational History   Occupation: RT    Employer: [employer] @ [employer]    Comment: [employer] and [employer]  Tobacco Use   Smoking status: Never   Smokeless tobacco: Never  Substance and Sexual Activity  Alcohol use: Yes    Alcohol/week: 0.0 - 4.0 standard drinks of alcohol    Comment: social   Drug use: No   Sexual activity: Not Currently    Partners: Male  Other Topics Concern   Not on file  Social History Narrative   Separated from second husband.  Very contentious split.   Social Drivers of Corporate investment banker Strain: Low Risk  (01/15/2022)   Overall Financial Resource Strain (CARDIA)    Difficulty of Paying Living Expenses: Not hard at all  Food Insecurity: Patient Unable To Answer (12/31/2022)   Hunger Vital Sign    Worried About Programme researcher, broadcasting/film/video in the Last Year: Patient unable to answer    Ran Out of Food in the Last Year: Patient unable to answer  Transportation Needs: Patient Unable To Answer (12/31/2022)   PRAPARE - Transportation    Lack of Transportation (Medical): Patient unable to answer    Lack of Transportation (Non-Medical): Patient unable to answer  Physical Activity: Inactive (02/03/2021)   [facility]   Exercise Vital Sign    Days of Exercise per Week: 0 days    Minutes of Exercise per Session: 0 min  Stress: Stress Concern Present (02/03/2021)   Received from [facility], [facility] - Occupational Stress Questionnaire    Feeling of Stress : To some extent  Social Connections: Unknown (06/16/2021)   Received from [facility], [facility]   Social Network    Social Network: Not on file  Intimate Partner Violence: Patient Unable To Answer (12/31/2022)   Humiliation, Afraid, Rape, and Kick questionnaire    Fear of Current or Ex-Partner: Patient unable to answer    Emotionally Abused: Patient unable to answer    Physically Abused: Patient unable to answer    Sexually Abused: Patient unable to answer   Family History:  Family History  Problem Relation Age of Onset   Mental illness Father        depression   COPD Father    Alcohol abuse Brother    Breast cancer Neg Hx     Review of Systems: Constitutional: Doesn't report fevers, chills or abnormal weight loss Eyes: Doesn't report blurriness of vision Ears, nose, mouth, throat, and face: Doesn't report sore throat Respiratory: +dyspnea Cardiovascular: Doesn't report palpitation, chest discomfort  Gastrointestinal:  Doesn't report nausea, constipation, diarrhea GU: Doesn't report incontinence Skin: Doesn't report skin rashes Neurological: Per HPI Musculoskeletal: Doesn't report joint pain Behavioral/Psych: Doesn't report anxiety  Physical Exam: Vitals:   07/01/23 1457  BP: 112/89  Pulse: (!) 120  Resp: 17  Temp: 98 F (36.7 C)  SpO2: 90%    KPS: 60. General: laying down, eyes closed Head: Normal EENT: No conjunctival injection or scleral icterus.  Lungs: Normal Cardiac: Regular rate Abdomen: Non-distended abdomen Skin: +purpura, arms and legs Extremities: +edema  Neurologic Exam: Mental Status: Drowsy, alert, attentive to examiner with tactile stimulation. Oriented to self and environment when attentive. Language is fluent with intact comprehension.  Displays some disorganized thinking. Age advanced psychomotor slowing. Cranial  Nerves: Visual acuity is grossly normal. Visual fields are full. Extra-ocular movements intact. No ptosis. Face is symmetric Motor: Tone and bulk are normal. Power is 2/5 in left arm, 4/5 in left leg. Reflexes are symmetric, no pathologic reflexes present.  Sensory: Intact to light touch Gait: Non ambulatory   Labs: I have reviewed the data as listed  Component Value Date/Time   NA 138 06/03/2023 1441   NA 142 06/30/2017 1815   K 3.8 06/03/2023 1441   CL 102 06/03/2023 1441   CO2 27 06/03/2023 1441   GLUCOSE 101 (H) 06/03/2023 1441   BUN 12 06/03/2023 1441   BUN 14 06/30/2017 1815   CREATININE 0.67 06/03/2023 1441   CREATININE 0.72 06/07/2015 0825   CALCIUM  9.8 06/03/2023 1441   PROT 6.9 06/03/2023 1441   PROT 7.0 06/30/2017 1815   ALBUMIN 4.1 06/03/2023 1441   ALBUMIN 4.6 06/30/2017 1815   AST 28 06/03/2023 1441   ALT 6 06/03/2023 1441   ALKPHOS 94 06/03/2023 1441   BILITOT 0.6 06/03/2023 1441   GFRNONAA >60 06/03/2023 1441   GFRAA  01/18/2022 0405    UNSATISFACTORY SPECIMEN. CLIENT REQUESTS SPECIMEN TO BE ANALYZED.   Lab Results  Component Value Date   WBC 5.5 07/01/2023   NEUTROABS 2.3 07/01/2023   HGB 15.5 (H) 07/01/2023   HCT 47.1 (H) 07/01/2023   MCV 86.7 07/01/2023   PLT 253 07/01/2023   Imaging:  [facility] Clinician Interpretation: I have personally reviewed the CNS images as listed.  My interpretation, in the context of the patient's clinical presentation, is stable disease  MR BRAIN W WO CONTRAST Result Date: 06/15/2023 CLINICAL DATA:  68 year old female pleomorphic astrocytoma status post surgery, IMRT, chemotherapy. Restaging. EXAM: MRI HEAD WITHOUT AND WITH CONTRAST TECHNIQUE: Multiplanar, multiecho pulse sequences of the brain and surrounding structures were obtained without and with intravenous contrast. CONTRAST:  5mL GADAVIST  GADOBUTROL  1 MMOL/ML IV SOLN COMPARISON:  Brain MRI 04/08/2023 and earlier. FINDINGS: Brain: Confluent T2 and FLAIR hyperintensity  in the right hemisphere from the superior perirolandic area tracking through the right parietal lobe into the right temporal lobe. Associated hemosiderin is stable. Involvement of the right deep white matter capsules and right cerebellar peduncle appears to be stable. No evidence of increased regional mass effect. No gyral enlargement compared to February. On DWI nodular right lateral ventricle ependymal restriction is increased in conspicuity since last month, new compared to December, although similar diffusion heterogeneity along the atrium and occipital horn of the right lateral ventricle has been present since last year. That diffusion signal also partially restricted, and stable since February. Following contrast petechial and ependymal, curvilinear enhancement primarily from the atrium of the lateral ventricle into the right temporal lobe is stable since last month, decreased compared to November (December postcontrast imaging degraded by motion). On minority of this is intrinsic T1 hyperintensity. No new posterior fossa or contralateral left hemisphere signal abnormality or enhancement is identified. No restricted diffusion suggestive of acute infarction. No midline shift, acute intracranial hemorrhage. Cervicomedullary junction and pituitary are within normal limits. No suspicious dural thickening. Vascular: Major intracranial vascular flow voids are stable. Following contrast the major dural venous sinuses are enhancing and appear to be patent. Skull and upper cervical spine: Post craniotomy changes on the right. Negative visible cervical spine and spinal cord. Normal background bone marrow signal. Sinuses/Orbits: Stable. Other: Visible internal auditory structures appear normal. IMPRESSION: Largely stable post treatment appearance of the Brain since February, although the nodular ependymal diffusion restriction along the body of the right lateral ventricle is more conspicuous, most compatible with  hypercellularity there. But stable T2/FLAIR and Postcontrast enhancement in the treatment area with no regional mass effect. Electronically Signed   By: Marlise Simpers M.D.   On: 06/15/2023 10:45    Assessment/Plan Pleomorphic xanthoastrocytoma of brain [facility]) - Plan: MR BRAIN W WO CONTRAST  Radiation therapy induced brain necrosis - Plan: MR BRAIN W WO CONTRAST  Lindsey Daniels is clinically stable today, though she is describing increased nausea and poor appetite.    We extensively discussed broad goals of care today; she is in agreement with some withdrawal of medical treatments due to potential effects of chronic dosing.  Given prolonged time since completing radiation therapy (27 months) we would expect radionecrosis to have resolved by now; therefore this may be a good time to put a hold on this therapy.  Not convinced that episode described this past week is c/w focal seizure.  Semiology is different from prior and non-localizing.  Recommended the following: -Hold off on further avastin  infusions -Increase PRN use of zofran  at facility -Continue BRAF/MEK inhibition with Dabrafenib and Trametinib  -Repeat brain MRI in 6 weeks -Continue physical therapy at nursing facility -Con't Lamictal  150mg  BID, Keppra  1000mg  BID  We asked her brother to take video of any future events that are reported by the patient as possible seizure activity.   We ask that Lindsey Daniels return to clinic in 6 weeks for MRI review, or sooner if needed.  All questions were answered. The patient knows to call the clinic with any problems, questions or concerns. No barriers to learning were detected.  The total time spent in the encounter was 40 minutes and more than 50% was on counseling and review of test results   Mamie Searles, MD Medical Director of Neuro-Oncology [facility] at [facility] 07/01/23 3:02 PM

## 2023-07-02 ENCOUNTER — Telehealth: Payer: Self-pay | Admitting: Internal Medicine

## 2023-07-02 NOTE — Telephone Encounter (Signed)
 I called [facility] to schedule Charell's appointments. Andy Bannister was unavailable. I informed the receptionist to have Andy Bannister give me as call at my direct line.

## 2023-07-05 ENCOUNTER — Telehealth: Payer: Self-pay | Admitting: Internal Medicine

## 2023-07-05 NOTE — Telephone Encounter (Signed)
 Thomas from [facility] called in to schedule Nasrin's appointment.

## 2023-07-06 ENCOUNTER — Other Ambulatory Visit: Payer: Self-pay

## 2023-07-20 ENCOUNTER — Emergency Department (HOSPITAL_COMMUNITY)
Admission: EM | Admit: 2023-07-20 | Discharge: 2023-07-21 | Disposition: A | Discharge status: Home / Self Care | Attending: Emergency Medicine | Admitting: Emergency Medicine

## 2023-07-20 ENCOUNTER — Encounter (HOSPITAL_COMMUNITY): Payer: Self-pay | Admitting: Emergency Medicine

## 2023-07-20 ENCOUNTER — Emergency Department (HOSPITAL_COMMUNITY)

## 2023-07-20 DIAGNOSIS — R569 Unspecified convulsions: Secondary | ICD-10-CM | POA: Diagnosis present

## 2023-07-20 DIAGNOSIS — G40909 Epilepsy, unspecified, not intractable, without status epilepticus: Secondary | ICD-10-CM | POA: Diagnosis not present

## 2023-07-20 DIAGNOSIS — Z7901 Long term (current) use of anticoagulants: Secondary | ICD-10-CM | POA: Insufficient documentation

## 2023-07-20 LAB — CBC
HCT: 47.7 % — ABNORMAL HIGH (ref 36.0–46.0)
Hemoglobin: 15.2 g/dL — ABNORMAL HIGH (ref 12.0–15.0)
MCH: 28.5 pg (ref 26.0–34.0)
MCHC: 31.9 g/dL (ref 30.0–36.0)
MCV: 89.3 fL (ref 80.0–100.0)
Platelets: 228 10*3/uL (ref 150–400)
RBC: 5.34 MIL/uL — ABNORMAL HIGH (ref 3.87–5.11)
RDW: 15.5 % (ref 11.5–15.5)
WBC: 9.2 10*3/uL (ref 4.0–10.5)
nRBC: 0 % (ref 0.0–0.2)

## 2023-07-20 LAB — DIFFERENTIAL
Abs Immature Granulocytes: 0.03 10*3/uL (ref 0.00–0.07)
Basophils Absolute: 0 10*3/uL (ref 0.0–0.1)
Basophils Relative: 0 %
Eosinophils Absolute: 0 10*3/uL (ref 0.0–0.5)
Eosinophils Relative: 0 %
Immature Granulocytes: 0 %
Lymphocytes Relative: 28 %
Lymphs Abs: 2.6 10*3/uL (ref 0.7–4.0)
Monocytes Absolute: 0.4 10*3/uL (ref 0.1–1.0)
Monocytes Relative: 4 %
Neutro Abs: 6.1 10*3/uL (ref 1.7–7.7)
Neutrophils Relative %: 68 %

## 2023-07-20 LAB — BASIC METABOLIC PANEL WITH GFR
Anion gap: 16 — ABNORMAL HIGH (ref 5–15)
BUN: 18 mg/dL (ref 8–23)
CO2: 21 mmol/L — ABNORMAL LOW (ref 22–32)
Calcium: 8.9 mg/dL (ref 8.9–10.3)
Chloride: 101 mmol/L (ref 98–111)
Creatinine, Ser: 0.63 mg/dL (ref 0.44–1.00)
GFR, Estimated: >60 mL/min (ref >60)
Glucose, Bld: 113 mg/dL — ABNORMAL HIGH (ref 70–99)
Potassium: 3.7 mmol/L (ref 3.5–5.1)
Sodium: 138 mmol/L (ref 135–145)

## 2023-07-20 LAB — HEPATIC FUNCTION PANEL
ALT: 13 U/L (ref 0–44)
AST: 38 U/L (ref 15–41)
Albumin: 3.2 g/dL — ABNORMAL LOW (ref 3.5–5.0)
Alkaline Phosphatase: 116 U/L (ref 38–126)
Bilirubin, Direct: 0.2 mg/dL (ref 0.0–0.2)
Indirect Bilirubin: 1 mg/dL — ABNORMAL HIGH (ref 0.3–0.9)
Total Bilirubin: 1.2 mg/dL (ref 0.0–1.2)
Total Protein: 6.1 g/dL — ABNORMAL LOW (ref 6.5–8.1)

## 2023-07-20 LAB — VALPROIC ACID LEVEL: Valproic Acid Lvl: <10 ug/mL — ABNORMAL LOW (ref 50–100)

## 2023-07-20 MED ORDER — LEVETIRACETAM (KEPPRA) 500 MG/5 ML ADULT IV PUSH
60.0000 mg/kg | Freq: Once | INTRAVENOUS | Status: AC
  Administered 2023-07-20: 3000 mg via INTRAVENOUS
  Filled 2023-07-20: qty 30

## 2023-07-20 MED ORDER — LACTATED RINGERS IV BOLUS
1000.0000 mL | Freq: Once | INTRAVENOUS | Status: AC
  Administered 2023-07-20: 1000 mL via INTRAVENOUS

## 2023-07-20 NOTE — ED Provider Notes (Signed)
 [place] EMERGENCY DEPARTMENT AT [facility] Provider Note   CSN: [id number] Arrival date & time: 07/20/23  1707     History {Add pertinent medical, surgical, social history, OB history to HPI:1} Chief Complaint  Patient presents with   Seizures    Lindsey Daniels is a 68 y.o. female.  HPI     68 year old female with a history of right temporal pleomorphic xanthoastrocytoma with history of seizures on Keppra  1000 mg twice daily and Lamictal  150 mg twice daily, initially presented in 2015 and had a resection with Dr. Nudleman, had a second surgery October 2022, nonambulatory due to significant left-sided weakness, is followed by Dr. Mark Sil who presents with concern for seizure from [facility].  Not taking medications per EMS and pt  Home Medications Prior to Admission medications   Medication Sig Start Date End Date Taking? Authorizing Provider  acetaminophen  (TYLENOL ) 650 MG CR tablet Take 650 mg by mouth every 8 (eight) hours as needed for pain.    [provider]  apixaban  (ELIQUIS ) 5 MG TABS tablet Take 1 tablet (5 mg total) by mouth 2 (two) times daily. 11/18/21   Vaslow, Zachary K, MD  atorvastatin  (LIPITOR) 20 MG tablet Take 1 tablet (20 mg total) by mouth daily. Patient taking differently: Take 40 mg by mouth daily. 07/25/17   Aldine Humphreys, PA  cetirizine  (ZYRTEC ) 10 MG tablet Take 10 mg by mouth daily.    [provider]  dabrafenib mesylate  (TAFINLAR ) 75 MG capsule Take 2 capsules (150 mg total) by mouth 2 (two) times daily. Take on an empty stomach 1 hour before or 2 hours after meals. 03/29/23   Vaslow, Zachary K, MD  fluticasone  (FLONASE ) 50 MCG/ACT nasal spray Place 1-2 sprays into both nostrils daily. Patient taking differently: Place 2 sprays into both nostrils daily. 11/06/20   Wieters, Hallie C, PA-C  lamoTRIgine  (LAMICTAL ) 150 MG tablet Take 1 tablet (150 mg total) by mouth 2 (two) times daily. 01/06/23   Ivin Marrow, MD  levETIRAcetam  (KEPPRA ) 1000 MG tablet Take 1 tablet (1,000 mg total) by mouth 2 (two) times daily. 06/15/23   Vaslow, Zachary K, MD  metoprolol  tartrate (LOPRESSOR ) 25 MG tablet Take 12.5 mg by mouth 2 (two) times daily.    [provider]  Midazolam  (NAYZILAM ) 5 MG/0.1ML SOLN Place 5 mg into the nose every 6 (six) hours as needed (prolonged seizure, greater than 5 minutes). 01/06/23   Quillen, Michael, MD  Multiple Vitamin (MULTIVITAMIN WITH MINERALS) TABS tablet Take 1 tablet by mouth daily.    [provider]  omeprazole  (PRILOSEC) 20 MG capsule Take 20 mg by mouth daily.    [provider]  ondansetron  (ZOFRAN ) 4 MG tablet Take 4 mg by mouth every 6 (six) hours as needed for nausea or vomiting.    [provider]  potassium chloride  SA (KLOR-CON  M) 20 MEQ tablet Take 1 tablet (20 mEq total) by mouth 2 (two) times daily. 02/02/23   Vaslow, Zachary K, MD  sertraline  (ZOLOFT ) 100 MG tablet Take 1 tablet (100 mg total) by mouth daily. 06/30/17   Aldine Humphreys, PA  sertraline  (ZOLOFT ) 50 MG tablet Take 50 mg by mouth daily.    [provider]  trametinib  dimethyl sulfoxide  (MEKINIST ) 2 MG tablet Take 1 tablet (2 mg total) by mouth daily. Take 1 hour before or 2 hours after a meal. Store refrigerated in original container. 01/11/23   Vaslow, Zachary K, MD  Allergies    Rifapentine, Amoxicillin, and Clavulanic acid    Review of Systems   Review of Systems  Physical Exam Updated Vital Signs BP (!) 128/96   Pulse (!) 122   Temp (!) 97 F (36.1 C)   Resp 19   SpO2 96%  Physical Exam  ED Results / Procedures / Treatments   Labs (all labs ordered are listed, but only abnormal results are displayed) Labs Reviewed  BASIC METABOLIC PANEL WITH GFR  VALPROIC ACID LEVEL  CBC  CBG MONITORING, ED    EKG None  Radiology No results found.  Procedures Procedures  {Document cardiac monitor, telemetry assessment procedure when  appropriate:1}  Medications Ordered in ED Medications - No data to display  ED Course/ Medical Decision Making/ A&P   {   Click here for ABCD2, HEART and other calculatorsREFRESH Note before signing :1}                              Medical Decision Making Amount and/or Complexity of Data Reviewed Labs: ordered. Radiology: ordered.   ***  {Document critical care time when appropriate:1} {Document review of labs and clinical decision tools ie heart score, Chads2Vasc2 etc:1}  {Document your independent review of radiology images, and any outside records:1} {Document your discussion with family members, caretakers, and with consultants:1} {Document social determinants of health affecting pt's care:1} {Document your decision making why or why not admission, treatments were needed:1} Final Clinical Impression(s) / ED Diagnoses Final diagnoses:  None    Rx / DC Orders ED Discharge Orders     None

## 2023-07-20 NOTE — ED Notes (Signed)
 Patient avoids eye contact and doesn't always want to answer questions

## 2023-07-20 NOTE — Discharge Instructions (Signed)
 Take your medications as prescribed. Please allow Lindsey Daniels enough time to take her medications.

## 2023-07-20 NOTE — ED Triage Notes (Signed)
 Patient bib [place] [facility] for seizures from facility. She has missed several days of medication. States they force feed her the medication and she has been refusing.

## 2023-07-20 NOTE — ED Notes (Signed)
 Patient cleaned up and attempted in and out cath no results noted.

## 2023-07-21 LAB — LAMOTRIGINE LEVEL: Lamotrigine Lvl: 4.7 ug/mL (ref 2.0–20.0)

## 2023-07-21 NOTE — ED Notes (Addendum)
 [facility] called for transport.

## 2023-07-23 ENCOUNTER — Telehealth: Payer: Self-pay | Admitting: Internal Medicine

## 2023-07-27 ENCOUNTER — Inpatient Hospital Stay: Attending: Internal Medicine

## 2023-07-27 ENCOUNTER — Inpatient Hospital Stay (HOSPITAL_BASED_OUTPATIENT_CLINIC_OR_DEPARTMENT_OTHER): Admitting: Internal Medicine

## 2023-07-27 VITALS — BP 115/88 | HR 92 | Temp 97.7°F | Resp 18

## 2023-07-27 DIAGNOSIS — G40909 Epilepsy, unspecified, not intractable, without status epilepticus: Secondary | ICD-10-CM | POA: Insufficient documentation

## 2023-07-27 DIAGNOSIS — C712 Malignant neoplasm of temporal lobe: Secondary | ICD-10-CM | POA: Diagnosis not present

## 2023-07-27 DIAGNOSIS — Y842 Radiological procedure and radiotherapy as the cause of abnormal reaction of the patient, or of later complication, without mention of misadventure at the time of the procedure: Secondary | ICD-10-CM

## 2023-07-27 DIAGNOSIS — C719 Malignant neoplasm of brain, unspecified: Secondary | ICD-10-CM

## 2023-07-27 DIAGNOSIS — Z79899 Other long term (current) drug therapy: Secondary | ICD-10-CM | POA: Diagnosis not present

## 2023-07-27 LAB — CBC WITH DIFFERENTIAL (CANCER CENTER ONLY)
Abs Immature Granulocytes: 0.04 10*3/uL (ref 0.00–0.07)
Basophils Absolute: 0 10*3/uL (ref 0.0–0.1)
Basophils Relative: 0 %
Eosinophils Absolute: 0 10*3/uL (ref 0.0–0.5)
Eosinophils Relative: 0 %
HCT: 39.5 % (ref 36.0–46.0)
Hemoglobin: 13.4 g/dL (ref 12.0–15.0)
Immature Granulocytes: 0 %
Lymphocytes Relative: 14 %
Lymphs Abs: 1.8 10*3/uL (ref 0.7–4.0)
MCH: 28.5 pg (ref 26.0–34.0)
MCHC: 33.9 g/dL (ref 30.0–36.0)
MCV: 84 fL (ref 80.0–100.0)
Monocytes Absolute: 1 10*3/uL (ref 0.1–1.0)
Monocytes Relative: 8 %
Neutro Abs: 9.9 10*3/uL — ABNORMAL HIGH (ref 1.7–7.7)
Neutrophils Relative %: 78 %
Platelet Count: 314 10*3/uL (ref 150–400)
RBC: 4.7 MIL/uL (ref 3.87–5.11)
RDW: 15.9 % — ABNORMAL HIGH (ref 11.5–15.5)
WBC Count: 12.8 10*3/uL — ABNORMAL HIGH (ref 4.0–10.5)
nRBC: 0 % (ref 0.0–0.2)

## 2023-07-27 LAB — CMP (CANCER CENTER ONLY)
ALT: 46 U/L — ABNORMAL HIGH (ref 0–44)
AST: 94 U/L — ABNORMAL HIGH (ref 15–41)
Albumin: 3.3 g/dL — ABNORMAL LOW (ref 3.5–5.0)
Alkaline Phosphatase: 497 U/L — ABNORMAL HIGH (ref 38–126)
Anion gap: 14 (ref 5–15)
BUN: 28 mg/dL — ABNORMAL HIGH (ref 8–23)
CO2: 25 mmol/L (ref 22–32)
Calcium: 9.6 mg/dL (ref 8.9–10.3)
Chloride: 97 mmol/L — ABNORMAL LOW (ref 98–111)
Creatinine: 0.46 mg/dL (ref 0.44–1.00)
GFR, Estimated: >60 mL/min (ref >60)
Glucose, Bld: 88 mg/dL (ref 70–99)
Potassium: 3.5 mmol/L (ref 3.5–5.1)
Sodium: 136 mmol/L (ref 135–145)
Total Bilirubin: 1.4 mg/dL — ABNORMAL HIGH (ref 0.0–1.2)
Total Protein: 6.6 g/dL (ref 6.5–8.1)

## 2023-07-27 MED ORDER — SENNOSIDES-DOCUSATE SODIUM 8.6-50 MG PO TABS: 1.0000 | ORAL_TABLET | Freq: Every day | ORAL | 2 refills | Status: DC

## 2023-07-27 MED ORDER — DOCUSATE SODIUM 100 MG PO CAPS: 100.0000 mg | ORAL_CAPSULE | Freq: Two times a day (BID) | ORAL | 0 refills | Status: DC

## 2023-07-27 NOTE — Progress Notes (Signed)
 [facility] [address]  [place], [place] [address] [phone]   Interval Evaluation  Date of Service: 07/27/23 Patient Name: Lindsey Daniels Patient MRN: [id number] Patient DOB: 13-Jun-1955 Provider: Mamie Searles, MD  Identifying Statement:  Lindsey Daniels is a 68 y.o. female with right temporal pleomorphic xanthoastrocytoma   Oncologic History: Oncology History  Pleomorphic xanthoastrocytoma of brain [facility])  09/29/2013 Surgery   Right temporal craniotomy, resection with Dr. Cipriano Creeks.  Path is PXA WHO II.   11/29/2020 Surgery   Disease progression, second craniotomy with Dr. Michale Age, sub-total resection.  Path is unchanged.   02/21/2021 - 04/02/2021 Radiation Therapy   IMRT radiation therapy with Dr. Jeryl Moris   12/16/2021 Treatment Plan Change   Completes 4 cycles of avastin  10mg /kg for severe inflammation, poor tolerance of steroids   12/02/2022 Progression   Progression of Disease   01/11/2023 -  Chemotherapy   Initiates dual BRAF/MEK inhibition with dabrafenib+trametinib    02/15/2023 -  Chemotherapy   Patient is on Treatment Plan : BRAIN GBM Bevacizumab  14d x 6 cycles       Biomarkers:   Interval History: Evanne Daniels presents today following recent ED visit.  She is somnolent today, acknowledges refusing many of the medications over the past week, including seizure medications.  Today she also describes ongoing nausea, lethargy, not improved despite cessation of avastin ; this is reported as the reason for med refusal.  It seems she has been having multiple seizures at the nursing facility, small and brief.   Remains non ambulatory due to significant left sided weakness.  She continues on combined BRAF/MEK inhibitory therapy, originally initaited 11/25.  Keppra  remains at 1000mg  BID, Lamictal  150mg  BID.  She does complain of some pain in her left shoulder, hoarseness.  Prior: She describes twitching of the left side which  persisted for more than 2 hours.  During admission Keppra  was increased  to 1500mg  twice per day, decadron  restarted 4mg  twice per day.  She remains quite weak on the left side compared to prior, particularly with the arm.  She is walking minimally with the walker, mostly using the wheelchair.  Very high levels of irritability, mood swings noted at the facility.  She is confused at times, but falls have been avoided.  H+P (01/30/21) Patient presents today after recent re-resection for progressive right temporal PXA.  She initially presented with a seizure in 2015; imaging demonstrated enhancing right temporal lesion which was grossly resected by Dr. Cipriano Creeks.  After last follow up in 2020, she began to experience headaches, ear fullness and some confusion this fall.  Imaging demonstrated marked progression of disease; she went for second surgery on 11/29/20.  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   Medications: Current Outpatient Medications on File Prior to Visit  Medication Sig Dispense Refill   acetaminophen  (TYLENOL ) 650 MG CR tablet Take 650 mg by mouth every 8 (eight) hours as needed for pain.     apixaban  (ELIQUIS ) 5 MG TABS tablet Take 1 tablet (5 mg total) by mouth 2 (two) times daily. 60 tablet 2   atorvastatin  (LIPITOR) 20 MG tablet Take 1 tablet (20 mg total) by mouth daily. (Patient taking differently: Take 40 mg by mouth daily.) 90 tablet 3   cetirizine  (ZYRTEC ) 10 MG tablet Take 10 mg by mouth daily.     dabrafenib mesylate  (TAFINLAR ) 75 MG capsule Take  2 capsules (150 mg total) by mouth 2 (two) times daily. Take on an empty stomach 1 hour before or 2 hours after meals. 120 capsule 1   fluticasone  (FLONASE ) 50 MCG/ACT nasal spray Place 1-2 sprays into both nostrils daily. (Patient taking differently: Place 2 sprays into both nostrils daily.) 16 g 0   hyoscyamine (LEVSIN SL) 0.125 MG SL tablet Place 0.125 mg  under the tongue every 4 (four) hours as needed (prn excess secretions).     lamoTRIgine  (LAMICTAL ) 150 MG tablet Take 1 tablet (150 mg total) by mouth 2 (two) times daily.     levETIRAcetam  (KEPPRA ) 1000 MG tablet Take 1 tablet (1,000 mg total) by mouth 2 (two) times daily.     LORazepam  (ATIVAN ) 0.5 MG tablet Take 0.5 mg by mouth every 4 (four) hours as needed for anxiety (for anxiety/agitation).     metoprolol  tartrate (LOPRESSOR ) 25 MG tablet Take 12.5 mg by mouth 2 (two) times daily.     Midazolam  (NAYZILAM ) 5 MG/0.1ML SOLN Place 5 mg into the nose every 6 (six) hours as needed (prolonged seizure, greater than 5 minutes). 1 each 0   morphine  (ROXANOL) 20 MG/ML concentrated solution Take 10 mg by mouth every 2 (two) hours as needed for severe pain (pain score 7-10). For pain or SOB     Multiple Vitamin (MULTIVITAMIN WITH MINERALS) TABS tablet Take 1 tablet by mouth daily.     omeprazole  (PRILOSEC) 20 MG capsule Take 20 mg by mouth daily.     ondansetron  (ZOFRAN ) 4 MG tablet Take 4 mg by mouth every 6 (six) hours as needed for nausea or vomiting.     potassium chloride  SA (KLOR-CON  M) 20 MEQ tablet Take 1 tablet (20 mEq total) by mouth 2 (two) times daily. 30 tablet 1   sertraline  (ZOLOFT ) 100 MG tablet Take 1 tablet (100 mg total) by mouth daily. 90 tablet 3   sertraline  (ZOLOFT ) 50 MG tablet Take 50 mg by mouth daily.     trametinib  dimethyl sulfoxide  (MEKINIST ) 2 MG tablet Take 1 tablet (2 mg total) by mouth daily. Take 1 hour before or 2 hours after a meal. Store refrigerated in original container. 30 tablet 1   No current facility-administered medications on file prior to visit.    Allergies:  Allergies  Allergen Reactions   Rifapentine Other (See Comments) and Nausea And Vomiting    Flu-like symptoms  rifapentine   Amoxicillin Rash   Clavulanic Acid Rash   Past Medical History:  Past Medical History:  Diagnosis Date   Allergy    Anal fissure    Anxiety    Cataract     Depression    Elevated LFTs    Hyperglycemia    Hyperlipidemia    Hypertension    Osteoarthritis    Pars defect of lumbar spine    Pleomorphic xanthoastrocytoma of temporal lobe (HCC)    Right   Seizures (HCC)    Spondylolisthesis    lumbar   Thrombocytopenia (HCC) 12/31/2022   Past Surgical History:  Past Surgical History:  Procedure Laterality Date   cataract     CRANIOTOMY N/A 10/02/2013   Procedure: 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;  Surgeon: Corrina Dimitri, MD;  Location: [facility];  Service: Neurosurgery;  Laterality: N/A;  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   CRANIOTOMY Right 11/29/2020   Procedure: Right Parietal craniotomy for tumor resection;  Surgeon: Audie Bleacher, MD;  Location: [facility] OR;  Service: Neurosurgery;  Laterality: Right;   HEEL SPUR SURGERY     Social History:  Social History   Socioeconomic History   Marital status: Divorced    Spouse name: separated-no papers file   Number of children: 0   Years of education: Not on file   Highest education level: Not on file  Occupational History   Occupation: RT    Employer: [employer] @     Comment: [employer] and [employer]  Tobacco Use   Smoking status: Never   Smokeless tobacco: Never  Substance and Sexual Activity   Alcohol use: Yes    Alcohol/week: 0.0 - 4.0 standard drinks of alcohol    Comment: social   Drug use: No   Sexual activity: Not Currently    Partners: Male  Other Topics Concern   Not on file  Social History Narrative   Separated from second husband.  Very contentious split.   Social Drivers of Corporate investment banker Strain: Low Risk  (01/15/2022)   Overall Financial Resource Strain (CARDIA)    Difficulty of Paying Living Expenses: Not hard at all  Food Insecurity: Patient Unable To Answer (12/31/2022)   Hunger Vital Sign    Worried About Programme researcher, broadcasting/film/video in the Last Year: Patient unable to answer    Ran  Out of Food in the Last Year: Patient unable to answer  Transportation Needs: Patient Unable To Answer (12/31/2022)   PRAPARE - Transportation    Lack of Transportation (Medical): Patient unable to answer    Lack of Transportation (Non-Medical): Patient unable to answer  Physical Activity: Inactive (02/03/2021)   Received from [facility], [facility]   Exercise Vital Sign    Days of Exercise per Week: 0 days    Minutes of Exercise per Session: 0 min  Stress: Stress Concern Present (02/03/2021)   Received from [facility], [facility] - Occupational Stress Questionnaire    Feeling of Stress : To some extent  Social Connections: Unknown (06/16/2021)   Received from [facility], [facility]   Social Network    Social Network: Not on file  Intimate Partner Violence: Patient Unable To Answer (12/31/2022)   Humiliation, Afraid, Rape, and Kick questionnaire    Fear of Current or Ex-Partner: Patient unable to answer    Emotionally Abused: Patient unable to answer    Physically Abused: Patient unable to answer    Sexually Abused: Patient unable to answer   Family History:  Family History  Problem Relation Age of Onset   Mental illness Father        depression   COPD Father    Alcohol abuse Brother    Breast cancer Neg Hx     Review of Systems: Constitutional: Doesn't report fevers, chills or abnormal weight loss Eyes: Doesn't report blurriness of vision Ears, nose, mouth, throat, and face: Doesn't report sore throat Respiratory: +dyspnea Cardiovascular: Doesn't report palpitation, chest discomfort  Gastrointestinal:  Doesn't report nausea, constipation, diarrhea GU: Doesn't report incontinence Skin: Doesn't report skin rashes Neurological: Per HPI Musculoskeletal: Doesn't report joint pain Behavioral/Psych: Doesn't report anxiety  Physical Exam: Vitals:   07/27/23 1457  BP: 115/88  Pulse: 92  Resp: 18  Temp: 97.7 F  (36.5 C)  SpO2: 97%    KPS: 60. General: laying down, eyes closed Head: Normal EENT: No conjunctival injection or scleral icterus.  Lungs: Normal Cardiac: Regular rate Abdomen: Non-distended abdomen Skin: +purpura, arms and legs  Extremities: +edema  Neurologic Exam: Mental Status: Drowsy, alert, attentive to examiner with tactile stimulation. Oriented to self and environment when attentive. Language is fluent with intact comprehension.  Displays some disorganized thinking. Age advanced psychomotor slowing. Cranial Nerves: Visual acuity is grossly normal. Visual fields are full. Extra-ocular movements intact. No ptosis. Face is symmetric Motor: Tone and bulk are normal. Power is 2/5 in left arm, 4/5 in left leg. Reflexes are symmetric, no pathologic reflexes present.  Sensory: Intact to light touch Gait: Non ambulatory   Labs: I have reviewed the data as listed    Component Value Date/Time   NA 136 07/27/2023 1425   NA 142 06/30/2017 1815   K 3.5 07/27/2023 1425   CL 97 (L) 07/27/2023 1425   CO2 25 07/27/2023 1425   GLUCOSE 88 07/27/2023 1425   BUN 28 (H) 07/27/2023 1425   BUN 14 06/30/2017 1815   CREATININE 0.46 07/27/2023 1425   CREATININE 0.72 06/07/2015 0825   CALCIUM  9.6 07/27/2023 1425   PROT 6.6 07/27/2023 1425   PROT 7.0 06/30/2017 1815   ALBUMIN 3.3 (L) 07/27/2023 1425   ALBUMIN 4.6 06/30/2017 1815   AST 94 (H) 07/27/2023 1425   ALT 46 (H) 07/27/2023 1425   ALKPHOS 497 (H) 07/27/2023 1425   BILITOT 1.4 (H) 07/27/2023 1425   GFRNONAA >60 07/27/2023 1425   GFRAA  01/18/2022 0405    UNSATISFACTORY SPECIMEN. CLIENT REQUESTS SPECIMEN TO BE ANALYZED.   Lab Results  Component Value Date   WBC 12.8 (H) 07/27/2023   NEUTROABS 9.9 (H) 07/27/2023   HGB 13.4 07/27/2023   HCT 39.5 07/27/2023   MCV 84.0 07/27/2023   PLT 314 07/27/2023    Assessment/Plan Pleomorphic xanthoastrocytoma of brain [facility])  Seizure disorder (HCC)  Charolette Copier exhibits clinical  changes today, with depressed mental status and level of attention.  This is likely secondary to week-long refusal of all medications at her facility, including seizure medications.  She reports refusing meds because of nausea and lethargy.    We recommended STOPPING/HOLDING BRAF/MEK inhibition with Dabrafenib and Trametinib  due to concerns over GI symptoms, nausea.  Zofran  can be increased to 8mg  q6 PRN in the interim.  Avastin  has already been discontinued.  Can also start colace and senna given reported constipation with Zofran .  Otherwise should con't Lamictal  150mg  BID, Keppra  1000mg  BID; these had led to good control when compliant.  We ask that Laraine Plate Pandit return to clinic in 2 weeks following planned MRI for review, or sooner if needed.  All questions were answered. The patient knows to call the clinic with any problems, questions or concerns. No barriers to learning were detected.  The total time spent in the encounter was 40 minutes and more than 50% was on counseling and review of test results   Mamie Searles, MD Medical Director of Neuro-Oncology [facility] at [place] 07/27/23 3:16 PM

## 2023-07-28 ENCOUNTER — Telehealth: Payer: Self-pay | Admitting: Internal Medicine

## 2023-07-29 ENCOUNTER — Ambulatory Visit: Admitting: Internal Medicine

## 2023-07-29 ENCOUNTER — Other Ambulatory Visit

## 2023-07-29 ENCOUNTER — Ambulatory Visit

## 2023-07-30 ENCOUNTER — Other Ambulatory Visit: Payer: Self-pay

## 2023-08-12 ENCOUNTER — Ambulatory Visit (HOSPITAL_COMMUNITY)
Admission: RE | Admit: 2023-08-12 | Discharge: 2023-08-12 | Disposition: A | Discharge status: Home / Self Care | Source: Ambulatory Visit | Attending: Internal Medicine | Admitting: Internal Medicine

## 2023-08-12 DIAGNOSIS — Y842 Radiological procedure and radiotherapy as the cause of abnormal reaction of the patient, or of later complication, without mention of misadventure at the time of the procedure: Secondary | ICD-10-CM | POA: Insufficient documentation

## 2023-08-12 DIAGNOSIS — I6789 Other cerebrovascular disease: Secondary | ICD-10-CM | POA: Insufficient documentation

## 2023-08-12 DIAGNOSIS — C719 Malignant neoplasm of brain, unspecified: Secondary | ICD-10-CM | POA: Diagnosis present

## 2023-08-12 MED ORDER — GADOBUTROL 1 MMOL/ML IV SOLN
5.0000 mL | Freq: Once | INTRAVENOUS | Status: AC | PRN
  Administered 2023-08-12: 5 mL via INTRAVENOUS

## 2023-08-16 ENCOUNTER — Inpatient Hospital Stay: Admitting: Internal Medicine

## 2023-08-16 ENCOUNTER — Inpatient Hospital Stay

## 2023-08-16 ENCOUNTER — Other Ambulatory Visit

## 2023-08-16 ENCOUNTER — Ambulatory Visit: Admitting: Internal Medicine

## 2023-08-16 VITALS — BP 121/75 | HR 118 | Resp 19

## 2023-08-16 DIAGNOSIS — C719 Malignant neoplasm of brain, unspecified: Secondary | ICD-10-CM

## 2023-08-16 DIAGNOSIS — G40909 Epilepsy, unspecified, not intractable, without status epilepticus: Secondary | ICD-10-CM

## 2023-08-16 DIAGNOSIS — C712 Malignant neoplasm of temporal lobe: Secondary | ICD-10-CM | POA: Diagnosis not present

## 2023-08-16 MED ORDER — LAMOTRIGINE 200 MG PO TABS: 200.0000 mg | ORAL_TABLET | Freq: Two times a day (BID) | ORAL | 2 refills | Status: DC

## 2023-08-16 MED ORDER — ONDANSETRON HCL 4 MG PO TABS: 4.0000 mg | ORAL_TABLET | Freq: Two times a day (BID) | ORAL | 0 refills | Status: DC

## 2023-08-16 NOTE — Progress Notes (Signed)
 [facility] at [facility] [address]  [place], [place] [address] [phone]   Interval Evaluation  Date of Service: 08/16/23 Patient Name: Lindsey Daniels Patient MRN: [id number] Patient DOB: 1955-08-19 Provider: Arthea MARLA Manns, MD  Identifying Statement:  Lindsey Daniels is a 68 y.o. female with right temporal pleomorphic xanthoastrocytoma   Oncologic History: Oncology History  Pleomorphic xanthoastrocytoma of brain [facility])  09/29/2013 Surgery   Right temporal craniotomy, resection with Dr. Alix.  Path is PXA WHO II.   11/29/2020 Surgery   Disease progression, second craniotomy with Dr. Gillie, sub-total resection.  Path is unchanged.   02/21/2021 - 04/02/2021 Radiation Therapy   IMRT radiation therapy with Dr. Dewey   12/16/2021 Treatment Plan Change   Completes 4 cycles of avastin  10mg /kg for severe inflammation, poor tolerance of steroids   12/02/2022 Progression   Progression of Disease   01/11/2023 -  Chemotherapy   Initiates dual BRAF/MEK inhibition with dabrafenib+trametinib    02/15/2023 -  Chemotherapy   Patient is on Treatment Plan : BRAIN GBM Bevacizumab  14d x 6 cycles       Biomarkers:   Interval History: Lindsey Daniels presents today following recent MRI brain.  She is somnolent today after experiencing a brief breakthrough seizure while in lab.  This was described as head and eyes turning to the left, arm posing and twitching (both), lasting 3 minutes.  She continues to refuse many of the medications over the past week, including seizure medications.  This is documented on the forms from her facility, no seizure meds were dosed yesterday or Friday.  Reasons given are it's hard for me to swallow the large pills, and I'm still nauseated.   Remains non ambulatory due to significant left sided weakness.  She has stopped the combined BRAF/MEK inhibitory therapy, originally initaited 11/25, due to our prior instructions.  No  significant problems with the MRI study.   Prior: She describes twitching of the left side which persisted for more than 2 hours.  During admission Keppra  was increased  to 1500mg  twice per day, decadron  restarted 4mg  twice per day.  She remains quite weak on the left side compared to prior, particularly with the arm.  She is walking minimally with the walker, mostly using the wheelchair.  Very high levels of irritability, mood swings noted at the facility.  She is confused at times, but falls have been avoided.  H+P (01/30/21) Patient presents today after recent re-resection for progressive right temporal PXA.  She initially presented with a seizure in 2015; imaging demonstrated enhancing right temporal lesion which was grossly resected by Dr. Alix.  After last follow up in 2020, she began to experience headaches, ear fullness and some confusion this fall.  Imaging demonstrated marked progression of disease; she went for second surgery on 11/29/20.  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   Medications: Current Outpatient Medications on File Prior to Visit  Medication Sig Dispense Refill   acetaminophen  (TYLENOL ) 650 MG CR tablet Take 650 mg by mouth every 8 (eight) hours as needed for pain.     apixaban  (ELIQUIS ) 5 MG TABS tablet Take 1 tablet (5 mg total) by mouth 2 (two) times daily. 60 tablet 2   atorvastatin  (LIPITOR) 20 MG tablet Take 1 tablet (20 mg total) by mouth daily. (Patient taking differently: Take 40 mg by mouth daily.) 90 tablet 3  cetirizine  (ZYRTEC ) 10 MG tablet Take 10 mg by mouth daily.     dabrafenib mesylate  (TAFINLAR ) 75 MG capsule Take 2 capsules (150 mg total) by mouth 2 (two) times daily. Take on an empty stomach 1 hour before or 2 hours after meals. 120 capsule 1   docusate sodium  (COLACE) 100 MG capsule Take 1 capsule (100 mg total) by mouth 2 (two) times daily. 10 capsule 0    fluticasone  (FLONASE ) 50 MCG/ACT nasal spray Place 1-2 sprays into both nostrils daily. (Patient taking differently: Place 2 sprays into both nostrils daily.) 16 g 0   hyoscyamine (LEVSIN SL) 0.125 MG SL tablet Place 0.125 mg under the tongue every 4 (four) hours as needed (prn excess secretions).     lamoTRIgine  (LAMICTAL ) 150 MG tablet Take 1 tablet (150 mg total) by mouth 2 (two) times daily.     levETIRAcetam  (KEPPRA ) 1000 MG tablet Take 1 tablet (1,000 mg total) by mouth 2 (two) times daily.     LORazepam  (ATIVAN ) 0.5 MG tablet Take 0.5 mg by mouth every 4 (four) hours as needed for anxiety (for anxiety/agitation).     metoprolol  tartrate (LOPRESSOR ) 25 MG tablet Take 12.5 mg by mouth 2 (two) times daily.     Midazolam  (NAYZILAM ) 5 MG/0.1ML SOLN Place 5 mg into the nose every 6 (six) hours as needed (prolonged seizure, greater than 5 minutes). 1 each 0   morphine  (ROXANOL) 20 MG/ML concentrated solution Take 10 mg by mouth every 2 (two) hours as needed for severe pain (pain score 7-10). For pain or SOB     Multiple Vitamin (MULTIVITAMIN WITH MINERALS) TABS tablet Take 1 tablet by mouth daily.     omeprazole  (PRILOSEC) 20 MG capsule Take 20 mg by mouth daily.     ondansetron  (ZOFRAN ) 4 MG tablet Take 4 mg by mouth every 6 (six) hours as needed for nausea or vomiting.     potassium chloride  SA (KLOR-CON  M) 20 MEQ tablet Take 1 tablet (20 mEq total) by mouth 2 (two) times daily. 30 tablet 1   senna-docusate (SENNA S) 8.6-50 MG tablet Take 1 tablet by mouth daily. 30 tablet 2   sertraline  (ZOLOFT ) 100 MG tablet Take 1 tablet (100 mg total) by mouth daily. 90 tablet 3   sertraline  (ZOLOFT ) 50 MG tablet Take 50 mg by mouth daily.     trametinib  dimethyl sulfoxide  (MEKINIST ) 2 MG tablet Take 1 tablet (2 mg total) by mouth daily. Take 1 hour before or 2 hours after a meal. Store refrigerated in original container. 30 tablet 1   No current facility-administered medications on file prior to visit.     Allergies:  Allergies  Allergen Reactions   Rifapentine Other (See Comments) and Nausea And Vomiting    Flu-like symptoms  rifapentine   Amoxicillin Rash   Clavulanic Acid Rash   Past Medical History:  Past Medical History:  Diagnosis Date   Allergy    Anal fissure    Anxiety    Cataract    Depression    Elevated LFTs    Hyperglycemia    Hyperlipidemia    Hypertension    Osteoarthritis    Pars defect of lumbar spine    Pleomorphic xanthoastrocytoma of temporal lobe (HCC)    Right   Seizures (HCC)    Spondylolisthesis    lumbar   Thrombocytopenia (HCC) 12/31/2022   Past Surgical History:  Past Surgical History:  Procedure Laterality Date   cataract     CRANIOTOMY N/A 10/02/2013  Procedure: 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;  Surgeon: Lamar LELON Peaches, MD;  Location: [facility];  Service: Neurosurgery;  Laterality: N/A;  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   CRANIOTOMY Right 11/29/2020   Procedure: Right Parietal craniotomy for tumor resection;  Surgeon: Gillie Duncans, MD;  Location: [facility] OR;  Service: Neurosurgery;  Laterality: Right;   HEEL SPUR SURGERY     Social History:  Social History   Socioeconomic History   Marital status: Divorced    Spouse name: separated-no papers file   Number of children: 0   Years of education: Not on file   Highest education level: Not on file  Occupational History   Occupation: RT    Employer: [employer]    Comment: [employer] and [employer]  Tobacco Use   Smoking status: Never   Smokeless tobacco: Never  Substance and Sexual Activity   Alcohol use: Yes    Alcohol/week: 0.0 - 4.0 standard drinks of alcohol    Comment: social   Drug use: No   Sexual activity: Not Currently    Partners: Male  Other Topics Concern   Not on file  Social History Narrative   Separated from second husband.  Very contentious split.   Social Drivers of Manufacturing engineer Strain: Low Risk  (01/15/2022)   Overall Financial Resource Strain (CARDIA)    Difficulty of Paying Living Expenses: Not hard at all  Food Insecurity: Patient Unable To Answer (12/31/2022)   Hunger Vital Sign    Worried About Programme researcher, broadcasting/film/video in the Last Year: Patient unable to answer    Ran Out of Food in the Last Year: Patient unable to answer  Transportation Needs: Patient Unable To Answer (12/31/2022)   PRAPARE - Transportation    Lack of Transportation (Medical): Patient unable to answer    Lack of Transportation (Non-Medical): Patient unable to answer  Physical Activity: Inactive (02/03/2021)   Received from [facility]   Exercise Vital Sign    Days of Exercise per Week: 0 days    Minutes of Exercise per Session: 0 min  Stress: Stress Concern Present (02/03/2021)   Received from [facility] - Occupational Stress Questionnaire    Feeling of Stress : To some extent  Social Connections: Unknown (06/16/2021)   Received from [facility]   Social Network    Social Network: Not on file  Intimate Partner Violence: Patient Unable To Answer (12/31/2022)   Humiliation, Afraid, Rape, and Kick questionnaire    Fear of Current or Ex-Partner: Patient unable to answer    Emotionally Abused: Patient unable to answer    Physically Abused: Patient unable to answer    Sexually Abused: Patient unable to answer   Family History:  Family History  Problem Relation Age of Onset   Mental illness Father        depression   COPD Father    Alcohol abuse Brother    Breast cancer Neg Hx     Review of Systems: Constitutional: Doesn't report fevers, chills or abnormal weight loss Eyes: Doesn't report blurriness of vision Ears, nose, mouth, throat, and face: Doesn't report sore throat Respiratory: +dyspnea Cardiovascular: Doesn't report palpitation, chest discomfort  Gastrointestinal:  Doesn't report nausea, constipation,  diarrhea GU: Doesn't report incontinence Skin: Doesn't report skin rashes Neurological: Per HPI Musculoskeletal: Doesn't report joint pain Behavioral/Psych: Doesn't report anxiety  Physical Exam: Vitals:  08/16/23 1120  BP: 121/75  Pulse: (!) 118  Resp: 19  SpO2: 97%    KPS: 60. General: laying down, eyes closed Head: Normal EENT: No conjunctival injection or scleral icterus.  Lungs: Normal Cardiac: Regular rate Abdomen: Non-distended abdomen Skin: +purpura, arms and legs Extremities: +edema  Neurologic Exam: Mental Status: Drowsy, alert, attentive to examiner with tactile stimulation. Oriented to self and environment when attentive. Language is fluent with intact comprehension.  Displays some disorganized thinking. Age advanced psychomotor slowing. Cranial Nerves: Visual acuity is grossly normal. Visual fields are full. Extra-ocular movements intact. No ptosis. Face is symmetric Motor: Tone and bulk are normal. Power is 2/5 in left arm, 4/5 in left leg. Reflexes are symmetric, no pathologic reflexes present.  Sensory: Intact to light touch Gait: Non ambulatory   Labs: I have reviewed the data as listed    Component Value Date/Time   NA 136 07/27/2023 1425   NA 142 06/30/2017 1815   K 3.5 07/27/2023 1425   CL 97 (L) 07/27/2023 1425   CO2 25 07/27/2023 1425   GLUCOSE 88 07/27/2023 1425   BUN 28 (H) 07/27/2023 1425   BUN 14 06/30/2017 1815   CREATININE 0.46 07/27/2023 1425   CREATININE 0.72 06/07/2015 0825   CALCIUM  9.6 07/27/2023 1425   PROT 6.6 07/27/2023 1425   PROT 7.0 06/30/2017 1815   ALBUMIN 3.3 (L) 07/27/2023 1425   ALBUMIN 4.6 06/30/2017 1815   AST 94 (H) 07/27/2023 1425   ALT 46 (H) 07/27/2023 1425   ALKPHOS 497 (H) 07/27/2023 1425   BILITOT 1.4 (H) 07/27/2023 1425   GFRNONAA >60 07/27/2023 1425   GFRAA  01/18/2022 0405    UNSATISFACTORY SPECIMEN. CLIENT REQUESTS SPECIMEN TO BE ANALYZED.   Lab Results  Component Value Date   WBC 12.8 (H)  07/27/2023   NEUTROABS 9.9 (H) 07/27/2023   HGB 13.4 07/27/2023   HCT 39.5 07/27/2023   MCV 84.0 07/27/2023   PLT 314 07/27/2023   Imaging:  [facility] Clinician Interpretation: I have personally reviewed the CNS images as listed.  My interpretation, in the context of the patient's clinical presentation, is stable disease  MR BRAIN W WO CONTRAST Result Date: 08/12/2023 CLINICAL DATA:  Brain/CNS neoplasm, assess treatment response EXAM: MRI HEAD WITHOUT AND WITH CONTRAST TECHNIQUE: Multiplanar, multiecho pulse sequences of the brain and surrounding structures were obtained without and with intravenous contrast. CONTRAST:  5mL GADAVIST  GADOBUTROL  1 MMOL/ML IV SOLN COMPARISON:  CT of the head dated July 20, 2023 and MRI of the head dated June 11, 2023. FINDINGS: Brain: An amorphous lesion is again noted within the right temporal lobe and junctions with the right parietal and temporal lobes there are blocked areas of increased diffusion signal again demonstrated along the right periatrial white matter. Linear bands of increased diffusion signal or again noted along the lateral ependymal surface. There is and irregular lattice of contrast enhancement again noted within the lesion, which appears similar in extent to the prior study, but appears more avid on the current exam. There is increased T2 signal present throughout the right cerebral white matter, excepting for the anterior frontal lobe. There is also extensive hemosiderin staining within the amorphous lesion, likely reflecting prior hemorrhage or calcification. Vascular: Normal vascular flow voids. Skull and upper cervical spine: Right posterior craniotomy. Sinuses/Orbits: Status post bilateral lens replacement. Mild mucosal disease within the ethmoid air cells and floor the left maxillary sinus. Other: None. IMPRESSION: 1. A previously treated amorphous lesion within the right temporal, parietal  and occipital lobes appears to demonstrate slightly more avid  enhancement compared to the prior study, but is otherwise unchanged in size and appearance. There is no convincing evidence of interval progression. Electronically Signed   By: Evalene Coho M.D.   On: 08/12/2023 15:31   CT Head Wo Contrast Result Date: 07/20/2023 CLINICAL DATA:  Seizure disorder, clinical change EXAM: CT HEAD WITHOUT CONTRAST TECHNIQUE: Contiguous axial images were obtained from the base of the skull through the vertex without intravenous contrast. RADIATION DOSE REDUCTION: This exam was performed according to the departmental dose-optimization program which includes automated exposure control, adjustment of the mA and/or kV according to patient size and/or use of iterative reconstruction technique. COMPARISON:  MRI April 25, 25. FINDINGS: Brain: When comparing cross modalities to recent MRI, no substantial change in known treated astrocytoma. This includes areas of low-attenuation and calcification. Similar mass effect. No evidence of acute large vascular territory infarct, acute hemorrhage, midline shift or hydrocephalus. Vascular: Calcific atherosclerosis. Skull: No acute fracture.  Right craniotomy. Sinuses/Orbits: Mostly clear sinuses.  No acute findings. Other: No mastoid effusions. IMPRESSION: 1. When comparing cross modalities to recent MRI, no substantial change in known treated astrocytoma. An MRI with contrast could provide more sensitive evaluation if clinically warranted. 2. No evidence of new acute superimposed abnormality. Electronically Signed   By: Gilmore GORMAN Molt M.D.   On: 07/20/2023 21:35    Assessment/Plan Pleomorphic xanthoastrocytoma of brain [facility])  Seizure disorder (HCC)  Lindsey Daniels presents today with breakthrough focal seizure, secondary to medication non-compliance or refusal.  Nausea may be a factor as well, though she has not been dosed with PRN zofran  for over two weeks.  MRI brain did not demonstrate any recurrence or regrowth of tumor or  radiation necrosis.  Increase in enhancement is likely secondary to avastin  washout artifact.  Recommended the following: -Increase Lamictal  to 200mg  BID -STOP Keppra  due to difficulty swallowing pills, psych side effects (hallucinations and agitation, med refusal) -START standing zofran  4mg  BID, will do this for 1 month -Continue to hold Tafinlar  and Mekinist   Avastin  has already been discontinued.  Can also con't colace and senna given constipation with Zofran .  We ask that Lindsey Daniels return to clinic in 1 month, or sooner if needed.  Next MRI can be in 2-3 months depending on clinical status.  All questions were answered. The patient knows to call the clinic with any problems, questions or concerns. No barriers to learning were detected.  The total time spent in the encounter was 40 minutes and more than 50% was on counseling and review of test results   Arthea MARLA Manns, MD Medical Director of Neuro-Oncology [facility] 08/16/23 11:52 AM

## 2023-08-17 ENCOUNTER — Other Ambulatory Visit: Payer: Self-pay

## 2023-08-18 ENCOUNTER — Telehealth: Payer: Self-pay | Admitting: Internal Medicine

## 2023-08-18 NOTE — Telephone Encounter (Signed)
 Scheduled appointment per 6/30 los. Called and left a VM with appointment details for the patient.

## 2023-08-19 ENCOUNTER — Other Ambulatory Visit: Payer: Self-pay

## 2023-09-03 ENCOUNTER — Other Ambulatory Visit: Payer: Self-pay

## 2023-09-13 ENCOUNTER — Inpatient Hospital Stay: Attending: Internal Medicine | Admitting: Internal Medicine

## 2023-09-13 VITALS — BP 137/93 | HR 104 | Temp 97.8°F | Resp 18

## 2023-09-13 DIAGNOSIS — Z79899 Other long term (current) drug therapy: Secondary | ICD-10-CM | POA: Diagnosis not present

## 2023-09-13 DIAGNOSIS — C712 Malignant neoplasm of temporal lobe: Secondary | ICD-10-CM | POA: Diagnosis not present

## 2023-09-13 DIAGNOSIS — C719 Malignant neoplasm of brain, unspecified: Secondary | ICD-10-CM

## 2023-09-13 DIAGNOSIS — G40909 Epilepsy, unspecified, not intractable, without status epilepticus: Secondary | ICD-10-CM

## 2023-09-13 MED ORDER — ONDANSETRON 4 MG PO TBDP: 4.0000 mg | ORAL_TABLET | Freq: Three times a day (TID) | ORAL | 0 refills | Status: DC | PRN

## 2023-09-13 MED ORDER — LAMOTRIGINE 200 MG PO TBDP: 200.0000 mg | ORAL_TABLET | Freq: Two times a day (BID) | ORAL | 2 refills | Status: DC

## 2023-09-13 NOTE — Progress Notes (Signed)
 [facility] [address]  [place], [place] [address] [phone]   Interval Evaluation  Date of Service: 09/13/23 Patient Name: Lindsey Daniels Patient MRN: [id number] Patient DOB: 09-05-1955 Provider: Arthea MARLA Manns, MD  Identifying Statement:  [name] is a 68 y.o. female with right temporal pleomorphic xanthoastrocytoma   Oncologic History: Oncology History  Pleomorphic xanthoastrocytoma of brain [facility])  09/29/2013 Surgery   Right temporal craniotomy, resection with Dr. Alix.  Path is PXA WHO II.   11/29/2020 Surgery   Disease progression, second craniotomy with Dr. Gillie, sub-total resection.  Path is unchanged.   02/21/2021 - 04/02/2021 Radiation Therapy   IMRT radiation therapy with Dr. Dewey   12/16/2021 Treatment Plan Change   Completes 4 cycles of avastin  10mg /kg for severe inflammation, poor tolerance of steroids   12/02/2022 Progression   Progression of Disease   01/11/2023 -  Chemotherapy   Initiates dual BRAF/MEK inhibition with dabrafenib+trametinib    02/15/2023 -  Chemotherapy   Patient is on Treatment Plan : BRAIN GBM Bevacizumab  14d x 6 cycles       Biomarkers:   Interval History: [name] presents today for clinical follow up.  She and her brother describe 3-4 brief additional seizures over the past month.  She continues to not obtain or refuse many of the medications, including seizure medications.  This continues to be documented on the forms from her facility, no seizure meds were dosed yesterday.  We can presume breakthrough seizures are during periods of non-compliance.  Reasons given are it's hard for me to swallow the large pills, and I'm still nauseated.   Remains non ambulatory due to significant left sided weakness.  She has stopped the combined BRAF/MEK inhibitory therapy, originally initaited 11/25, due to our prior instructions.     Prior: She describes twitching of the left side  which persisted for more than 2 hours.  During admission Keppra  was increased  to 1500mg  twice per day, decadron  restarted 4mg  twice per day.  She remains quite weak on the left side compared to prior, particularly with the arm.  She is walking minimally with the walker, mostly using the wheelchair.  Very high levels of irritability, mood swings noted at the facility.  She is confused at times, but falls have been avoided.  H+P (01/30/21) Patient presents today after recent re-resection for progressive right temporal PXA.  She initially presented with a seizure in 2015; imaging demonstrated enhancing right temporal lesion which was grossly resected by Dr. Alix.  After last follow up in 2020, she began to experience headaches, ear fullness and some confusion this fall.  Imaging demonstrated marked progression of disease; she went for second surgery on 11/29/20.  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   Medications: Current Outpatient Medications on File Prior to Visit  Medication Sig Dispense Refill   apixaban  (ELIQUIS ) 5 MG TABS tablet Take 1 tablet (5 mg total) by mouth 2 (two) times daily. 60 tablet 2   atorvastatin  (LIPITOR) 20 MG tablet Take 1 tablet (20 mg total) by mouth daily. (Patient taking differently: Take 40 mg by mouth daily.) 90 tablet 3   cetirizine  (ZYRTEC ) 10 MG tablet Take 10 mg by mouth daily.     dabrafenib mesylate  (TAFINLAR ) 75 MG capsule Take 2 capsules (150 mg total) by mouth 2 (two) times daily. Take on an empty stomach 1  hour before or 2 hours after meals. 120 capsule 1   fluticasone  (FLONASE ) 50 MCG/ACT nasal spray Place 1-2 sprays into both nostrils daily. (Patient taking differently: Place 1-2 sprays into both nostrils as needed.) 16 g 0   hyoscyamine (LEVSIN SL) 0.125 MG SL tablet Place 0.125 mg under the tongue every 4 (four) hours as needed (prn excess secretions).     lamoTRIgine   (LAMICTAL ) 200 MG tablet Take 1 tablet (200 mg total) by mouth 2 (two) times daily. 60 tablet 2   LORazepam  (ATIVAN ) 0.5 MG tablet Take 0.5 mg by mouth every 4 (four) hours as needed for anxiety (for anxiety/agitation).     metoprolol  tartrate (LOPRESSOR ) 25 MG tablet Take 12.5 mg by mouth 2 (two) times daily.     Midazolam  (NAYZILAM ) 5 MG/0.1ML SOLN Place 5 mg into the nose every 6 (six) hours as needed (prolonged seizure, greater than 5 minutes). 1 each 0   morphine  (ROXANOL) 20 MG/ML concentrated solution Take 10 mg by mouth every 2 (two) hours as needed for severe pain (pain score 7-10). For pain or SOB     Multiple Vitamin (MULTIVITAMIN WITH MINERALS) TABS tablet Take 1 tablet by mouth daily.     ondansetron  (ZOFRAN ) 4 MG tablet Take 1 tablet (4 mg total) by mouth 2 (two) times daily. 60 tablet 0   sertraline  (ZOLOFT ) 100 MG tablet Take 1 tablet (100 mg total) by mouth daily. 90 tablet 3   omeprazole  (PRILOSEC) 20 MG capsule Take 20 mg by mouth daily. (Patient not taking: Reported on 09/13/2023)     potassium chloride  SA (KLOR-CON  M) 20 MEQ tablet Take 1 tablet (20 mEq total) by mouth 2 (two) times daily. (Patient not taking: Reported on 09/13/2023) 30 tablet 1   sertraline  (ZOLOFT ) 50 MG tablet Take 50 mg by mouth daily. (Patient not taking: Reported on 09/13/2023)     trametinib  dimethyl sulfoxide  (MEKINIST ) 2 MG tablet Take 1 tablet (2 mg total) by mouth daily. Take 1 hour before or 2 hours after a meal. Store refrigerated in original container. (Patient not taking: Reported on 09/13/2023) 30 tablet 1   No current facility-administered medications on file prior to visit.    Allergies:  Allergies  Allergen Reactions   Rifapentine Other (See Comments) and Nausea And Vomiting    Flu-like symptoms  rifapentine   Amoxicillin Rash   Clavulanic Acid Rash   Past Medical History:  Past Medical History:  Diagnosis Date   Allergy    Anal fissure    Anxiety    Cataract    Depression     Elevated LFTs    Hyperglycemia    Hyperlipidemia    Hypertension    Osteoarthritis    Pars defect of lumbar spine    Pleomorphic xanthoastrocytoma of temporal lobe (HCC)    Right   Seizures (HCC)    Spondylolisthesis    lumbar   Thrombocytopenia (HCC) 12/31/2022   Past Surgical History:  Past Surgical History:  Procedure Laterality Date   cataract     CRANIOTOMY N/A 10/02/2013   Procedure: 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;  Surgeon: Lamar LELON Peaches, MD;  Location: [facility];  Service: Neurosurgery;  Laterality: N/A;  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   CRANIOTOMY Right 11/29/2020   Procedure: Right Parietal craniotomy for tumor resection;  Surgeon: Gillie Duncans, MD;  Location: [facility] OR;  Service: Neurosurgery;  Laterality: Right;   HEEL SPUR SURGERY     Social History:  Social History   Socioeconomic History   Marital status: Divorced    Spouse name: separated-no papers file   Number of children: 0   Years of education: Not on file   Highest education level: Not on file  Occupational History   Occupation: RT    Employer: [employer] @     Comment: [employer] and [employer]  Tobacco Use   Smoking status: Never   Smokeless tobacco: Never  Substance and Sexual Activity   Alcohol use: Yes    Alcohol/week: 0.0 - 4.0 standard drinks of alcohol    Comment: social   Drug use: No   Sexual activity: Not Currently    Partners: Male  Other Topics Concern   Not on file  Social History Narrative   Separated from second husband.  Very contentious split.   Social Drivers of Corporate investment banker Strain: Low Risk  (01/15/2022)   Overall Financial Resource Strain (CARDIA)    Difficulty of Paying Living Expenses: Not hard at all  Food Insecurity: Patient Unable To Answer (12/31/2022)   Hunger Vital Sign    Worried About Programme researcher, broadcasting/film/video in the Last Year: Patient unable to answer    Ran Out of Food in  the Last Year: Patient unable to answer  Transportation Needs: Patient Unable To Answer (12/31/2022)   PRAPARE - Transportation    Lack of Transportation (Medical): Patient unable to answer    Lack of Transportation (Non-Medical): Patient unable to answer  Physical Activity: Inactive (02/03/2021)   Received from [facility]   Exercise Vital Sign    On average, how many days per week do you engage in moderate to strenuous exercise (like a brisk walk)?: 0 days    On average, how many minutes do you engage in exercise at this level?: 0 min  Stress: Stress Concern Present (02/03/2021)   Received from [facility] of Occupational Health - Occupational Stress Questionnaire    Feeling of Stress : To some extent  Social Connections: Unknown (06/16/2021)   Received from [facility]   Social Network    Social Network: Not on file  Intimate Partner Violence: Patient Unable To Answer (12/31/2022)   Humiliation, Afraid, Rape, and Kick questionnaire    Fear of Current or Ex-Partner: Patient unable to answer    Emotionally Abused: Patient unable to answer    Physically Abused: Patient unable to answer    Sexually Abused: Patient unable to answer   Family History:  Family History  Problem Relation Age of Onset   Mental illness Father        depression   COPD Father    Alcohol abuse Brother    Breast cancer Neg Hx     Review of Systems: Constitutional: Doesn't report fevers, chills or abnormal weight loss Eyes: Doesn't report blurriness of vision Ears, nose, mouth, throat, and face: Doesn't report sore throat Respiratory: +dyspnea Cardiovascular: Doesn't report palpitation, chest discomfort  Gastrointestinal:  Doesn't report nausea, constipation, diarrhea GU: Doesn't report incontinence Skin: Doesn't report skin rashes Neurological: Per HPI Musculoskeletal: Doesn't report joint pain Behavioral/Psych: Doesn't report anxiety  Physical Exam: Vitals:   09/13/23  1100 09/13/23 1127  BP: (!) 138/95 (!) 137/93  Pulse: (!) 118 (!) 104  Resp: 18   Temp: 97.8 F (36.6 C)   SpO2: 98%     KPS: 60. General: laying down, eyes closed Head: Normal EENT: No conjunctival injection or scleral icterus.  Lungs: Normal Cardiac: Regular rate Abdomen: Non-distended abdomen Skin: +purpura, arms and legs Extremities: +edema  Neurologic Exam: Mental Status: Drowsy, alert, attentive to examiner with tactile stimulation. Oriented to self and environment when attentive. Language is fluent with intact comprehension.  Displays some disorganized thinking. Age advanced psychomotor slowing. Cranial Nerves: Visual acuity is grossly normal. Visual fields are full. Extra-ocular movements intact. No ptosis. Face is symmetric Motor: Tone and bulk are normal. Power is 2/5 in left arm, 4/5 in left leg. Reflexes are symmetric, no pathologic reflexes present.  Sensory: Intact to light touch Gait: Non ambulatory   Labs: I have reviewed the data as listed    Component Value Date/Time   NA 136 07/27/2023 1425   NA 142 06/30/2017 1815   K 3.5 07/27/2023 1425   CL 97 (L) 07/27/2023 1425   CO2 25 07/27/2023 1425   GLUCOSE 88 07/27/2023 1425   BUN 28 (H) 07/27/2023 1425   BUN 14 06/30/2017 1815   CREATININE 0.46 07/27/2023 1425   CREATININE 0.72 06/07/2015 0825   CALCIUM  9.6 07/27/2023 1425   PROT 6.6 07/27/2023 1425   PROT 7.0 06/30/2017 1815   ALBUMIN 3.3 (L) 07/27/2023 1425   ALBUMIN 4.6 06/30/2017 1815   AST 94 (H) 07/27/2023 1425   ALT 46 (H) 07/27/2023 1425   ALKPHOS 497 (H) 07/27/2023 1425   BILITOT 1.4 (H) 07/27/2023 1425   GFRNONAA >60 07/27/2023 1425   GFRAA  01/18/2022 0405    UNSATISFACTORY SPECIMEN. CLIENT REQUESTS SPECIMEN TO BE ANALYZED.   Lab Results  Component Value Date   WBC 12.8 (H) 07/27/2023   NEUTROABS 9.9 (H) 07/27/2023   HGB 13.4 07/27/2023   HCT 39.5 07/27/2023   MCV 84.0 07/27/2023   PLT 314 07/27/2023   Imaging:  [facility] Clinician  Interpretation: I have personally reviewed the CNS images as listed.  My interpretation, in the context of the patient's clinical presentation, is stable disease  No results found.   Assessment/Plan Pleomorphic xanthoastrocytoma of brain [facility]) - Plan: MR BRAIN W WO CONTRAST  Seizure disorder (HCC)  Melaina Howerton presents today with further breakthrough focal seizures, secondary to medication non-compliance or refusal.  There are three apparent reasons for medication non-complaince: dysphagia, patient nausea/vomiting, and psychological refusal.    Recommended the following: -Con't Lamictal  200mg  BID, and possibly transition to ODT formulation if we are able to obtain this.  Cost may be prohibitive. -Con't standing zofran  4mg  BID, but transition to ODT formulation.  This will navigate around nausea and dysphagia issues -Request further input on nausea from PCP or facility provider -Speech swallow/eval in facility if able -D/C senna/colace due to loose stools -D/C tylenol  due to patient preference -Continue to hold Tafinlar  and Mekinist  -Repeat MRI brain in 6 weeks  Avastin  has already been discontinued.  We ask that Ronal Slater Rondinelli return to clinic in 1-2 months with MRI brain, or sooner if needed.    All questions were answered. The patient knows to call the clinic with any problems, questions or concerns. No barriers to learning were detected.  The total time spent in the encounter was 40 minutes and more than 50% was on counseling and review of test results   Arthea MARLA Manns, MD Medical Director of Neuro-Oncology [facility] [place] 09/13/23 2:28 PM

## 2023-09-14 ENCOUNTER — Other Ambulatory Visit: Payer: Self-pay

## 2023-09-27 ENCOUNTER — Telehealth: Payer: Self-pay | Admitting: *Deleted

## 2023-09-27 NOTE — Telephone Encounter (Signed)
 PC to [facility], spoke with staff member there, informed her patient has MRI brain ordered with expected date of 10/21/23.  Phone number to [facility] given, staff member states she will inform the director, Debby, and he will schedule MRI.  Also informed her of appointments at [facility] on 10/25/23.  She verbalizes understanding.

## 2023-10-13 ENCOUNTER — Encounter: Payer: Self-pay | Admitting: Internal Medicine

## 2023-10-20 ENCOUNTER — Ambulatory Visit (HOSPITAL_COMMUNITY)
Admission: RE | Admit: 2023-10-20 | Discharge: 2023-10-20 | Disposition: A | Discharge status: Home / Self Care | Source: Ambulatory Visit | Attending: Internal Medicine | Admitting: Internal Medicine

## 2023-10-20 DIAGNOSIS — C719 Malignant neoplasm of brain, unspecified: Secondary | ICD-10-CM | POA: Insufficient documentation

## 2023-10-20 MED ORDER — GADOBUTROL 1 MMOL/ML IV SOLN
5.0000 mL | Freq: Once | INTRAVENOUS | Status: AC | PRN
  Administered 2023-10-20: 5 mL via INTRAVENOUS

## 2023-10-22 ENCOUNTER — Telehealth: Payer: Self-pay | Admitting: *Deleted

## 2023-10-22 NOTE — Telephone Encounter (Signed)
 10/21/2023 Late Entry:   [name] [place]'s brother Carlin Buttner of [place], [place]  in office to sign Release of Information per registrar.  Denied need for [facility] HIPAA Authorization.  Informed to come in early to sign authorization to be able  to receive or speak to anyone about her health.  Denied need to have sister sign the [employer].  I am her POA and Healthcare POA.  Confirmed POA documents have been received as requested.  Received a call this morning is why I am here.  Unaware of further authorization forms required.  Will investigate further.  When she arrives for next appointment may request to update Designated Party Release.

## 2023-10-25 ENCOUNTER — Inpatient Hospital Stay

## 2023-10-25 ENCOUNTER — Inpatient Hospital Stay: Admitting: Internal Medicine

## 2023-10-25 ENCOUNTER — Telehealth: Payer: Self-pay | Admitting: *Deleted

## 2023-10-25 NOTE — Telephone Encounter (Signed)
 Wes called back with fax number for office notes to be faxed to [facility] [phone].  Faxed last 3 visits to physician at [facility].  Attempted to reach [facility] [phone] Nurse Ginnie.  Left message pending call back.  Need to clarify if and when, what medications were discontinued (seizure and blood thinner) especially and communicate this to Dr Buckley to decide what needs to be reinstated.  Aware that they patient refuses a lot of medicines but they should still be offered.

## 2023-10-25 NOTE — Telephone Encounter (Signed)
 Called brother when patient had missed her appointment.  He said that transportation coordinator was supposed to have called to cancel the visit due to transportation conflict.  He will call to reschedule.   Brother called back later to inform us  that patient was transitioned under hospice by the facilities doctor.  We had no documentation of this on file.  He said that on 10/13/2023 patient had a seizure after refusing her antiepileptic medications.  The hospice nurse stated that she was transitioning the following day (mistook postictal phase as transitioning) and later called brother to advise the day after that she was incorrect.  They then discontinued her seizure medications.   He is asking for those to be reinstated. Advised him that we have no documentation of hospice assisting in care.  First we need the name of the agency so that we can communicate directly with them.  He also asked for the last office notes to be given to facility provider.  Instructed that we need contact information to be provided before we can do these things.  Pending call back from patient's brother.

## 2023-10-26 ENCOUNTER — Other Ambulatory Visit: Payer: Self-pay

## 2023-10-28 ENCOUNTER — Other Ambulatory Visit: Payer: Self-pay

## 2023-11-15 ENCOUNTER — Ambulatory Visit: Admitting: Internal Medicine

## 2023-11-15 ENCOUNTER — Other Ambulatory Visit

## 2023-11-17 DEATH — deceased

## 2024-03-21 IMAGING — MG MM DIGITAL SCREENING BILAT W/ TOMO AND CAD
8 series · 8 of 24 positions shown · non-contrast
Comparison: Previous exam(s).

CLINICAL DATA: Screening.

EXAM:
DIGITAL SCREENING BILATERAL MAMMOGRAM WITH TOMOSYNTHESIS AND CAD
TECHNIQUE: Bilateral screening digital craniocaudal and mediolateral oblique
mammograms were obtained. Bilateral screening digital breast
tomosynthesis was performed. The images were evaluated with
computer-aided detection.

[L CC synth-2D]
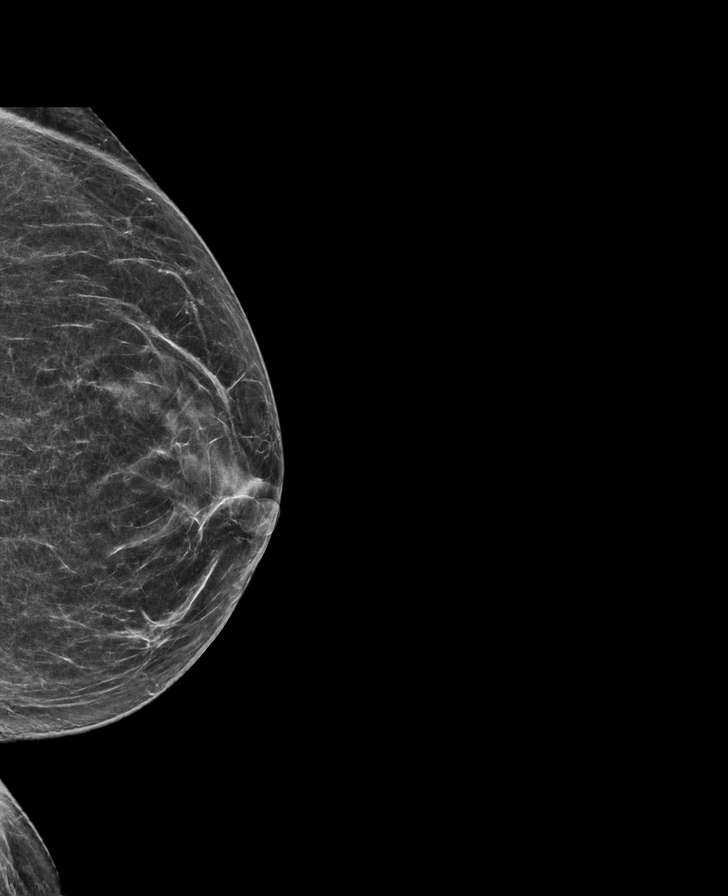

[L MLO synth-2D]
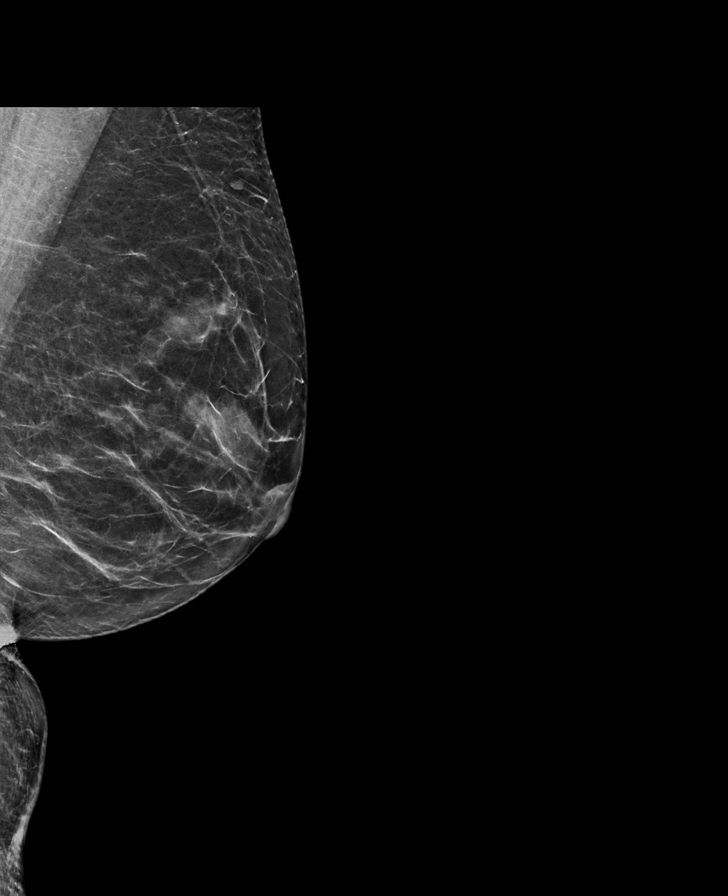

[R CC synth-2D]
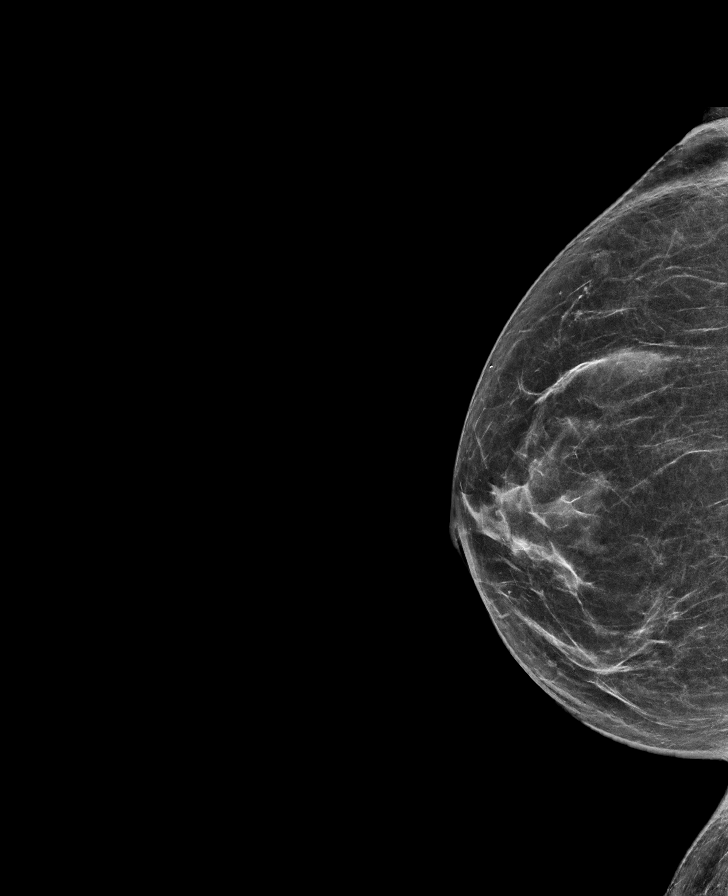

[R MLO synth-2D]
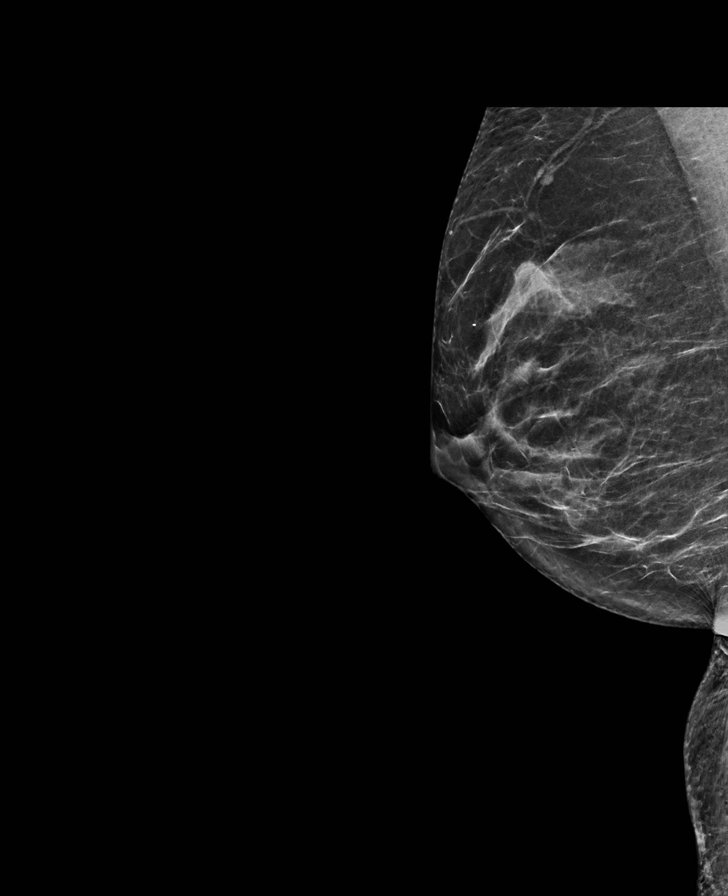

[R MLO tomo · tomo slice 31/62.0]
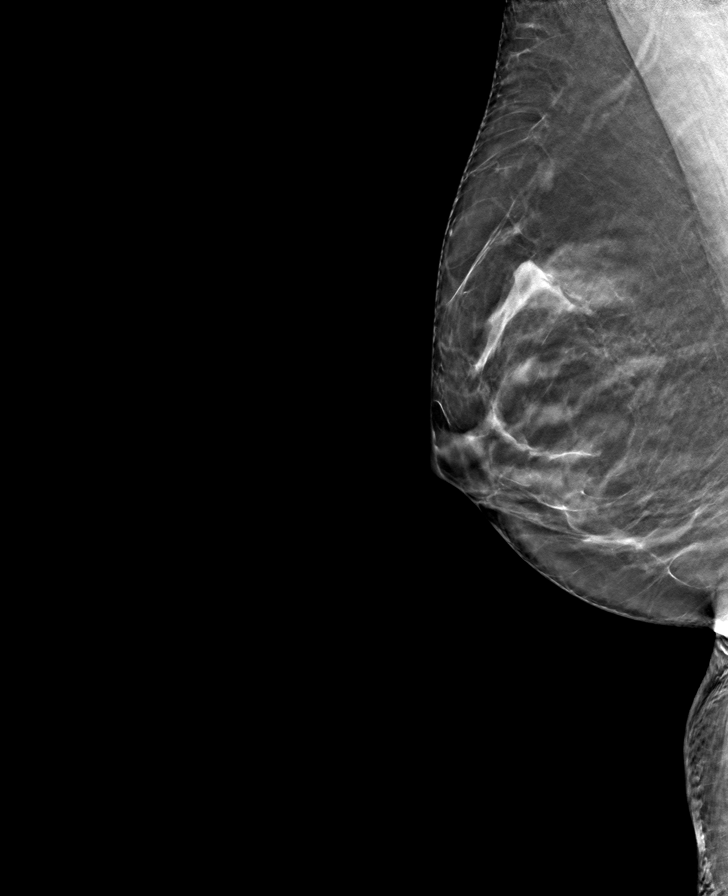

[L MLO tomo · tomo slice 32/63.0]
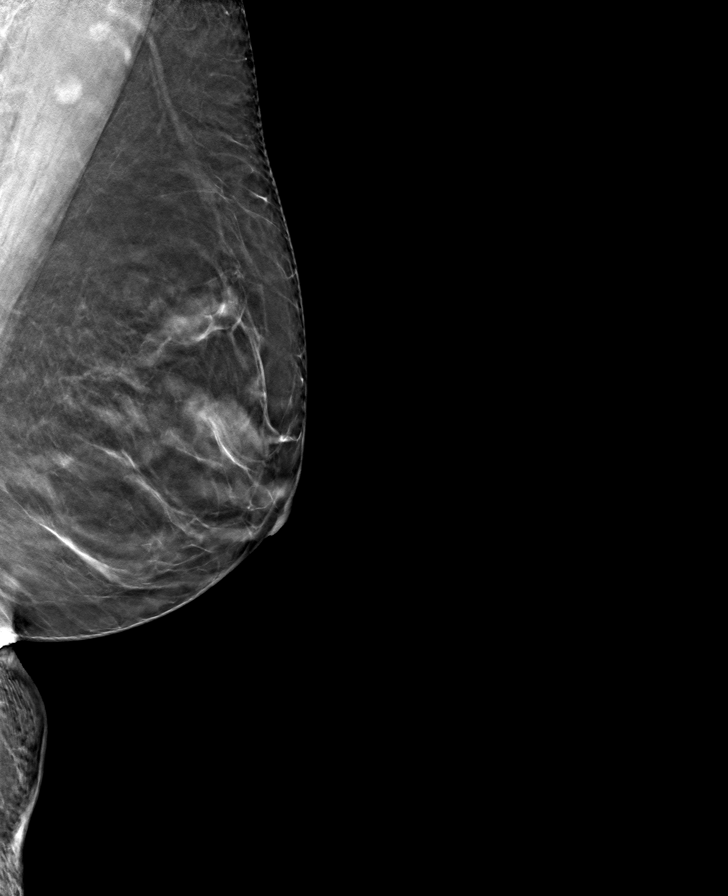

[R CC tomo · tomo slice 34/67.0]
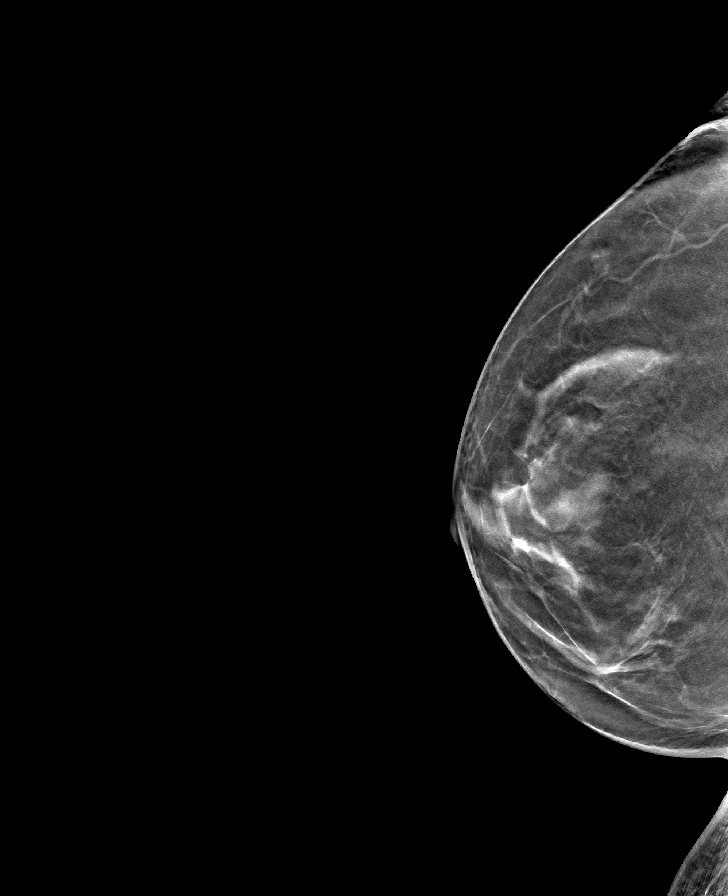

[L CC tomo · tomo slice 33/64.0]
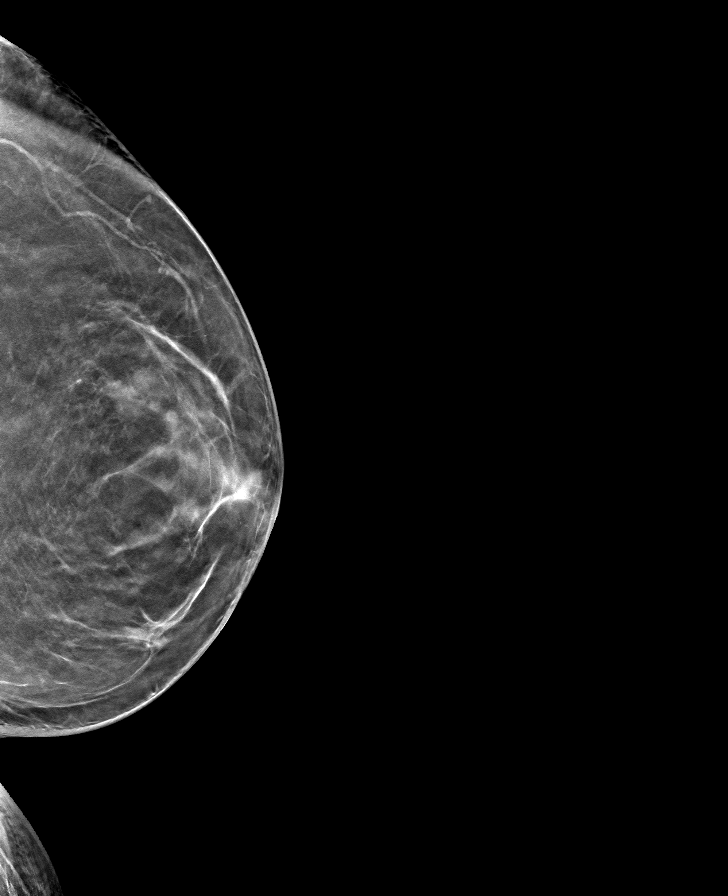

[8 of 24 positions shown; findings below may reference images not displayed]

ACR Breast Density Category b: There are scattered areas of
fibroglandular density.
FINDINGS: There are no findings suspicious for malignancy.
IMPRESSION: No mammographic evidence of malignancy. A result letter of this
screening mammogram will be mailed directly to the patient.

RECOMMENDATION:
Screening mammogram in one year. (Code:51-O-LD2)

BI-RADS CATEGORY  1: Negative.
